# Patient Record
Sex: Female | Born: 1935
Health system: Southern US, Community
[De-identification: ages and names within clinical notes are randomized; demographics above are authoritative.]

## PROBLEM LIST (undated history)

## (undated) DIAGNOSIS — D4959 Neoplasm of unspecified behavior of other genitourinary organ: Secondary | ICD-10-CM

## (undated) DIAGNOSIS — E785 Hyperlipidemia, unspecified: Secondary | ICD-10-CM

## (undated) DIAGNOSIS — K219 Gastro-esophageal reflux disease without esophagitis: Secondary | ICD-10-CM

## (undated) DIAGNOSIS — D649 Anemia, unspecified: Secondary | ICD-10-CM

## (undated) DIAGNOSIS — I10 Essential (primary) hypertension: Secondary | ICD-10-CM

## (undated) DIAGNOSIS — G629 Polyneuropathy, unspecified: Secondary | ICD-10-CM

## (undated) DIAGNOSIS — I872 Venous insufficiency (chronic) (peripheral): Secondary | ICD-10-CM

## (undated) DIAGNOSIS — I519 Heart disease, unspecified: Secondary | ICD-10-CM

## (undated) DIAGNOSIS — J189 Pneumonia, unspecified organism: Secondary | ICD-10-CM

## (undated) DIAGNOSIS — F0151 Vascular dementia with behavioral disturbance: Secondary | ICD-10-CM

## (undated) DIAGNOSIS — I428 Other cardiomyopathies: Secondary | ICD-10-CM

## (undated) DIAGNOSIS — J449 Chronic obstructive pulmonary disease, unspecified: Secondary | ICD-10-CM

## (undated) DIAGNOSIS — I4891 Unspecified atrial fibrillation: Secondary | ICD-10-CM

## (undated) DIAGNOSIS — I447 Left bundle-branch block, unspecified: Secondary | ICD-10-CM

## (undated) DIAGNOSIS — C801 Malignant (primary) neoplasm, unspecified: Secondary | ICD-10-CM

## (undated) DIAGNOSIS — I509 Heart failure, unspecified: Secondary | ICD-10-CM

## (undated) DIAGNOSIS — R0602 Shortness of breath: Secondary | ICD-10-CM

## (undated) DIAGNOSIS — N189 Chronic kidney disease, unspecified: Secondary | ICD-10-CM

## (undated) DIAGNOSIS — Z8719 Personal history of other diseases of the digestive system: Secondary | ICD-10-CM

## (undated) DIAGNOSIS — M1990 Unspecified osteoarthritis, unspecified site: Secondary | ICD-10-CM

## (undated) HISTORY — DX: Vascular dementia with behavioral disturbance: F01.51

## (undated) HISTORY — DX: Unspecified osteoarthritis, unspecified site: M19.90

## (undated) HISTORY — PX: TONSILLECTOMY: SUR1361

## (undated) HISTORY — DX: Anemia, unspecified: D64.9

## (undated) HISTORY — DX: Venous insufficiency (chronic) (peripheral): I87.2

## (undated) HISTORY — PX: ABDOMINAL HYSTERECTOMY: SHX81

## (undated) HISTORY — DX: Neoplasm of unspecified behavior of other genitourinary organ: D49.59

## (undated) HISTORY — DX: Heart disease, unspecified: I51.9

## (undated) HISTORY — DX: Chronic kidney disease, unspecified: N18.9

## (undated) HISTORY — DX: Essential (primary) hypertension: I10

## (undated) HISTORY — PX: DILATION AND CURETTAGE OF UTERUS: SHX78

## (undated) HISTORY — DX: Polyneuropathy, unspecified: G62.9

## (undated) HISTORY — DX: Heart failure, unspecified: I50.9

## (undated) HISTORY — DX: Other cardiomyopathies: I42.8

## (undated) HISTORY — PX: CATARACT EXTRACTION W/ INTRAOCULAR LENS  IMPLANT, BILATERAL: SHX1307

## (undated) HISTORY — DX: Malignant (primary) neoplasm, unspecified: C80.1

## (undated) HISTORY — DX: Hyperlipidemia, unspecified: E78.5

---

## 1985-06-30 HISTORY — PX: CHOLECYSTECTOMY: SHX55

## 1997-10-09 ENCOUNTER — Other Ambulatory Visit: Admission: RE | Admit: 1997-10-09 | Discharge: 1997-10-09 | Payer: Self-pay | Admitting: Family Medicine

## 1997-12-27 ENCOUNTER — Other Ambulatory Visit: Admission: RE | Admit: 1997-12-27 | Discharge: 1997-12-27 | Payer: Self-pay | Admitting: Family Medicine

## 1998-11-05 ENCOUNTER — Other Ambulatory Visit: Admission: RE | Admit: 1998-11-05 | Discharge: 1998-11-05 | Payer: Self-pay | Admitting: Family Medicine

## 2001-07-19 ENCOUNTER — Other Ambulatory Visit: Admission: RE | Admit: 2001-07-19 | Discharge: 2001-07-19 | Payer: Self-pay | Admitting: Family Medicine

## 2003-08-01 ENCOUNTER — Encounter (INDEPENDENT_AMBULATORY_CARE_PROVIDER_SITE_OTHER): Payer: Self-pay | Admitting: *Deleted

## 2003-08-01 LAB — CONVERTED CEMR LAB

## 2003-08-12 ENCOUNTER — Other Ambulatory Visit: Admission: RE | Admit: 2003-08-12 | Discharge: 2003-08-12 | Payer: Self-pay | Admitting: Family Medicine

## 2003-12-19 ENCOUNTER — Ambulatory Visit (HOSPITAL_COMMUNITY): Admission: RE | Admit: 2003-12-19 | Discharge: 2003-12-19 | Payer: Self-pay | Admitting: Internal Medicine

## 2004-02-04 ENCOUNTER — Emergency Department (HOSPITAL_COMMUNITY): Admission: EM | Admit: 2004-02-04 | Discharge: 2004-02-04 | Payer: Self-pay | Admitting: Family Medicine

## 2004-03-20 ENCOUNTER — Ambulatory Visit: Payer: Self-pay | Admitting: Family Medicine

## 2004-04-11 ENCOUNTER — Ambulatory Visit: Payer: Self-pay | Admitting: Family Medicine

## 2004-05-12 ENCOUNTER — Emergency Department (HOSPITAL_COMMUNITY): Admission: EM | Admit: 2004-05-12 | Discharge: 2004-05-12 | Payer: Self-pay | Admitting: Family Medicine

## 2004-05-13 ENCOUNTER — Ambulatory Visit: Payer: Self-pay

## 2004-07-02 ENCOUNTER — Ambulatory Visit: Payer: Self-pay | Admitting: Family Medicine

## 2004-10-09 ENCOUNTER — Ambulatory Visit: Payer: Self-pay | Admitting: Family Medicine

## 2004-11-01 ENCOUNTER — Ambulatory Visit: Payer: Self-pay | Admitting: Family Medicine

## 2005-01-16 ENCOUNTER — Ambulatory Visit: Payer: Self-pay | Admitting: Family Medicine

## 2005-01-21 ENCOUNTER — Ambulatory Visit: Payer: Self-pay | Admitting: Sports Medicine

## 2005-03-13 ENCOUNTER — Ambulatory Visit: Payer: Self-pay | Admitting: Family Medicine

## 2005-05-20 ENCOUNTER — Ambulatory Visit: Payer: Self-pay | Admitting: Family Medicine

## 2005-07-16 ENCOUNTER — Ambulatory Visit: Payer: Self-pay | Admitting: Family Medicine

## 2005-08-05 ENCOUNTER — Ambulatory Visit: Payer: Self-pay | Admitting: Family Medicine

## 2005-11-12 ENCOUNTER — Ambulatory Visit: Payer: Self-pay | Admitting: Family Medicine

## 2005-12-05 ENCOUNTER — Ambulatory Visit: Payer: Self-pay | Admitting: Family Medicine

## 2006-04-01 ENCOUNTER — Ambulatory Visit: Payer: Self-pay | Admitting: Family Medicine

## 2006-05-05 ENCOUNTER — Ambulatory Visit: Payer: Self-pay | Admitting: Family Medicine

## 2006-07-09 ENCOUNTER — Ambulatory Visit (HOSPITAL_COMMUNITY): Admission: RE | Admit: 2006-07-09 | Discharge: 2006-07-09 | Payer: Self-pay | Admitting: Family Medicine

## 2006-07-09 ENCOUNTER — Ambulatory Visit: Payer: Self-pay | Admitting: Family Medicine

## 2006-07-21 ENCOUNTER — Encounter (INDEPENDENT_AMBULATORY_CARE_PROVIDER_SITE_OTHER): Payer: Self-pay | Admitting: Cardiology

## 2006-07-21 ENCOUNTER — Ambulatory Visit (HOSPITAL_COMMUNITY): Admission: RE | Admit: 2006-07-21 | Discharge: 2006-07-21 | Payer: Self-pay | Admitting: Family Medicine

## 2006-08-27 DIAGNOSIS — E1140 Type 2 diabetes mellitus with diabetic neuropathy, unspecified: Secondary | ICD-10-CM | POA: Insufficient documentation

## 2006-08-27 DIAGNOSIS — E785 Hyperlipidemia, unspecified: Secondary | ICD-10-CM

## 2006-08-27 DIAGNOSIS — I10 Essential (primary) hypertension: Secondary | ICD-10-CM

## 2006-08-27 DIAGNOSIS — M1990 Unspecified osteoarthritis, unspecified site: Secondary | ICD-10-CM | POA: Insufficient documentation

## 2006-08-27 DIAGNOSIS — E119 Type 2 diabetes mellitus without complications: Secondary | ICD-10-CM | POA: Insufficient documentation

## 2006-08-28 ENCOUNTER — Encounter (INDEPENDENT_AMBULATORY_CARE_PROVIDER_SITE_OTHER): Payer: Self-pay | Admitting: *Deleted

## 2006-09-21 ENCOUNTER — Ambulatory Visit: Payer: Self-pay | Admitting: Family Medicine

## 2006-09-21 ENCOUNTER — Encounter (INDEPENDENT_AMBULATORY_CARE_PROVIDER_SITE_OTHER): Payer: Self-pay | Admitting: Family Medicine

## 2006-09-21 LAB — CONVERTED CEMR LAB
ALT: 16 units/L (ref 0–35)
Alkaline Phosphatase: 48 units/L (ref 39–117)
CO2: 25 meq/L (ref 19–32)
Creatinine, Ser: 1.64 mg/dL — ABNORMAL HIGH (ref 0.40–1.20)
Direct LDL: 76 mg/dL
Sodium: 143 meq/L (ref 135–145)
Total Bilirubin: 0.5 mg/dL (ref 0.3–1.2)
Total Protein: 7 g/dL (ref 6.0–8.3)

## 2006-09-23 ENCOUNTER — Encounter (INDEPENDENT_AMBULATORY_CARE_PROVIDER_SITE_OTHER): Payer: Self-pay | Admitting: Family Medicine

## 2006-10-13 ENCOUNTER — Ambulatory Visit: Payer: Self-pay | Admitting: Family Medicine

## 2006-10-13 LAB — CONVERTED CEMR LAB
HDL goal, serum: 40 mg/dL
Hgb A1c MFr Bld: 6.1 %
LDL Goal: 100 mg/dL

## 2006-11-20 ENCOUNTER — Telehealth: Payer: Self-pay | Admitting: *Deleted

## 2006-12-16 ENCOUNTER — Ambulatory Visit: Payer: Self-pay | Admitting: Family Medicine

## 2006-12-21 ENCOUNTER — Telehealth (INDEPENDENT_AMBULATORY_CARE_PROVIDER_SITE_OTHER): Payer: Self-pay | Admitting: *Deleted

## 2007-01-11 ENCOUNTER — Ambulatory Visit: Payer: Self-pay | Admitting: Family Medicine

## 2007-01-11 LAB — CONVERTED CEMR LAB: Hgb A1c MFr Bld: 5.9 %

## 2007-01-12 ENCOUNTER — Encounter (INDEPENDENT_AMBULATORY_CARE_PROVIDER_SITE_OTHER): Payer: Self-pay | Admitting: Family Medicine

## 2007-01-15 ENCOUNTER — Ambulatory Visit: Payer: Self-pay | Admitting: Family Medicine

## 2007-01-20 ENCOUNTER — Encounter (INDEPENDENT_AMBULATORY_CARE_PROVIDER_SITE_OTHER): Payer: Self-pay | Admitting: Family Medicine

## 2007-01-20 ENCOUNTER — Ambulatory Visit: Payer: Self-pay | Admitting: Family Medicine

## 2007-01-20 LAB — CONVERTED CEMR LAB
AST: 18 units/L (ref 0–37)
Alkaline Phosphatase: 45 units/L (ref 39–117)
BUN: 46 mg/dL — ABNORMAL HIGH (ref 6–23)
Glucose, Bld: 61 mg/dL — ABNORMAL LOW (ref 70–99)
Potassium: 4.7 meq/L (ref 3.5–5.3)
Sodium: 143 meq/L (ref 135–145)
Total Bilirubin: 0.7 mg/dL (ref 0.3–1.2)

## 2007-01-26 ENCOUNTER — Ambulatory Visit: Payer: Self-pay | Admitting: Family Medicine

## 2007-01-26 DIAGNOSIS — N183 Chronic kidney disease, stage 3 (moderate): Secondary | ICD-10-CM

## 2007-01-26 DIAGNOSIS — N1832 Chronic kidney disease, stage 3b: Secondary | ICD-10-CM | POA: Insufficient documentation

## 2007-01-27 ENCOUNTER — Encounter: Admission: RE | Admit: 2007-01-27 | Discharge: 2007-01-27 | Payer: Self-pay | Admitting: Sports Medicine

## 2007-01-30 ENCOUNTER — Encounter (INDEPENDENT_AMBULATORY_CARE_PROVIDER_SITE_OTHER): Payer: Self-pay | Admitting: Family Medicine

## 2007-01-30 LAB — CONVERTED CEMR LAB
Collection Interval-CRCL: 24 hr
Creatinine Clearance: 53 mL/min — ABNORMAL LOW (ref 75–115)
Creatinine, Urine: 55.2 mg/dL
Protein, Ur: 119 mg/24hr — ABNORMAL HIGH (ref 50–100)

## 2007-02-01 ENCOUNTER — Telehealth (INDEPENDENT_AMBULATORY_CARE_PROVIDER_SITE_OTHER): Payer: Self-pay | Admitting: Family Medicine

## 2007-02-18 ENCOUNTER — Telehealth (INDEPENDENT_AMBULATORY_CARE_PROVIDER_SITE_OTHER): Payer: Self-pay | Admitting: Family Medicine

## 2007-02-22 ENCOUNTER — Ambulatory Visit: Payer: Self-pay | Admitting: Family Medicine

## 2007-02-22 ENCOUNTER — Encounter (INDEPENDENT_AMBULATORY_CARE_PROVIDER_SITE_OTHER): Payer: Self-pay | Admitting: Family Medicine

## 2007-02-22 LAB — CONVERTED CEMR LAB
Calcium: 9.7 mg/dL (ref 8.4–10.5)
Creatinine, Ser: 1.71 mg/dL — ABNORMAL HIGH (ref 0.40–1.20)
Sodium: 142 meq/L (ref 135–145)

## 2007-03-04 ENCOUNTER — Ambulatory Visit: Payer: Self-pay | Admitting: Sports Medicine

## 2007-03-04 DIAGNOSIS — D4959 Neoplasm of unspecified behavior of other genitourinary organ: Secondary | ICD-10-CM

## 2007-03-04 HISTORY — DX: Neoplasm of unspecified behavior of other genitourinary organ: D49.59

## 2007-03-05 ENCOUNTER — Telehealth (INDEPENDENT_AMBULATORY_CARE_PROVIDER_SITE_OTHER): Payer: Self-pay | Admitting: Family Medicine

## 2007-03-15 ENCOUNTER — Encounter (INDEPENDENT_AMBULATORY_CARE_PROVIDER_SITE_OTHER): Payer: Self-pay | Admitting: Family Medicine

## 2007-03-15 ENCOUNTER — Ambulatory Visit (HOSPITAL_COMMUNITY): Admission: RE | Admit: 2007-03-15 | Discharge: 2007-03-15 | Payer: Self-pay | Admitting: Urology

## 2007-03-16 ENCOUNTER — Encounter (INDEPENDENT_AMBULATORY_CARE_PROVIDER_SITE_OTHER): Payer: Self-pay | Admitting: Family Medicine

## 2007-03-24 ENCOUNTER — Telehealth: Payer: Self-pay | Admitting: *Deleted

## 2007-03-26 ENCOUNTER — Encounter (INDEPENDENT_AMBULATORY_CARE_PROVIDER_SITE_OTHER): Payer: Self-pay | Admitting: Family Medicine

## 2007-04-05 ENCOUNTER — Encounter (INDEPENDENT_AMBULATORY_CARE_PROVIDER_SITE_OTHER): Payer: Self-pay | Admitting: Family Medicine

## 2007-04-05 ENCOUNTER — Telehealth (INDEPENDENT_AMBULATORY_CARE_PROVIDER_SITE_OTHER): Payer: Self-pay | Admitting: Family Medicine

## 2007-04-05 ENCOUNTER — Ambulatory Visit: Payer: Self-pay | Admitting: Sports Medicine

## 2007-04-06 ENCOUNTER — Telehealth (INDEPENDENT_AMBULATORY_CARE_PROVIDER_SITE_OTHER): Payer: Self-pay | Admitting: *Deleted

## 2007-04-12 ENCOUNTER — Encounter: Payer: Self-pay | Admitting: Sports Medicine

## 2007-04-12 ENCOUNTER — Telehealth (INDEPENDENT_AMBULATORY_CARE_PROVIDER_SITE_OTHER): Payer: Self-pay | Admitting: *Deleted

## 2007-04-12 ENCOUNTER — Ambulatory Visit (HOSPITAL_COMMUNITY): Admission: RE | Admit: 2007-04-12 | Discharge: 2007-04-12 | Payer: Self-pay | Admitting: Sports Medicine

## 2007-04-12 ENCOUNTER — Ambulatory Visit: Payer: Self-pay | Admitting: Vascular Surgery

## 2007-04-12 ENCOUNTER — Telehealth (INDEPENDENT_AMBULATORY_CARE_PROVIDER_SITE_OTHER): Payer: Self-pay | Admitting: Family Medicine

## 2007-04-20 ENCOUNTER — Ambulatory Visit: Payer: Self-pay | Admitting: Family Medicine

## 2007-04-22 ENCOUNTER — Encounter: Payer: Self-pay | Admitting: *Deleted

## 2007-05-04 ENCOUNTER — Ambulatory Visit: Payer: Self-pay | Admitting: Family Medicine

## 2007-05-06 ENCOUNTER — Ambulatory Visit: Payer: Self-pay | Admitting: Family Medicine

## 2007-05-06 ENCOUNTER — Telehealth (INDEPENDENT_AMBULATORY_CARE_PROVIDER_SITE_OTHER): Payer: Self-pay | Admitting: *Deleted

## 2007-05-06 ENCOUNTER — Encounter (INDEPENDENT_AMBULATORY_CARE_PROVIDER_SITE_OTHER): Payer: Self-pay | Admitting: Family Medicine

## 2007-05-08 LAB — CONVERTED CEMR LAB
ALT: 11 units/L (ref 0–35)
AST: 15 units/L (ref 0–37)
Albumin: 4.2 g/dL (ref 3.5–5.2)
CO2: 27 meq/L (ref 19–32)
Calcium: 10.1 mg/dL (ref 8.4–10.5)
Chloride: 104 meq/L (ref 96–112)
Potassium: 4.3 meq/L (ref 3.5–5.3)
Sodium: 142 meq/L (ref 135–145)
Total Protein: 7.3 g/dL (ref 6.0–8.3)

## 2007-05-13 ENCOUNTER — Telehealth (INDEPENDENT_AMBULATORY_CARE_PROVIDER_SITE_OTHER): Payer: Self-pay | Admitting: Family Medicine

## 2007-05-17 ENCOUNTER — Ambulatory Visit: Payer: Self-pay | Admitting: Family Medicine

## 2007-05-19 ENCOUNTER — Encounter (INDEPENDENT_AMBULATORY_CARE_PROVIDER_SITE_OTHER): Payer: Self-pay | Admitting: Family Medicine

## 2007-05-31 ENCOUNTER — Telehealth (INDEPENDENT_AMBULATORY_CARE_PROVIDER_SITE_OTHER): Payer: Self-pay | Admitting: Family Medicine

## 2007-06-04 ENCOUNTER — Ambulatory Visit: Payer: Self-pay | Admitting: Family Medicine

## 2007-06-08 ENCOUNTER — Telehealth (INDEPENDENT_AMBULATORY_CARE_PROVIDER_SITE_OTHER): Payer: Self-pay | Admitting: *Deleted

## 2007-06-09 ENCOUNTER — Ambulatory Visit: Payer: Self-pay | Admitting: Family Medicine

## 2007-07-09 ENCOUNTER — Ambulatory Visit: Payer: Self-pay | Admitting: Family Medicine

## 2007-07-09 LAB — CONVERTED CEMR LAB: Hgb A1c MFr Bld: 6.5 %

## 2007-07-13 ENCOUNTER — Telehealth: Payer: Self-pay | Admitting: *Deleted

## 2007-08-04 ENCOUNTER — Telehealth (INDEPENDENT_AMBULATORY_CARE_PROVIDER_SITE_OTHER): Payer: Self-pay | Admitting: Family Medicine

## 2007-08-17 ENCOUNTER — Telehealth (INDEPENDENT_AMBULATORY_CARE_PROVIDER_SITE_OTHER): Payer: Self-pay | Admitting: Family Medicine

## 2007-08-25 ENCOUNTER — Encounter (INDEPENDENT_AMBULATORY_CARE_PROVIDER_SITE_OTHER): Payer: Self-pay | Admitting: Family Medicine

## 2007-09-10 ENCOUNTER — Ambulatory Visit: Payer: Self-pay | Admitting: Family Medicine

## 2007-09-13 ENCOUNTER — Ambulatory Visit: Payer: Self-pay | Admitting: Family Medicine

## 2007-09-13 ENCOUNTER — Encounter (INDEPENDENT_AMBULATORY_CARE_PROVIDER_SITE_OTHER): Payer: Self-pay | Admitting: Family Medicine

## 2007-09-13 LAB — CONVERTED CEMR LAB
Albumin: 4.5 g/dL (ref 3.5–5.2)
Alkaline Phosphatase: 39 units/L (ref 39–117)
BUN: 46 mg/dL — ABNORMAL HIGH (ref 6–23)
Creatinine, Ser: 1.73 mg/dL — ABNORMAL HIGH (ref 0.40–1.20)
Glucose, Bld: 120 mg/dL — ABNORMAL HIGH (ref 70–99)
HDL: 54 mg/dL (ref >39)
LDL Cholesterol: 76 mg/dL (ref 0–99)
Potassium: 4.6 meq/L (ref 3.5–5.3)
Total Bilirubin: 0.7 mg/dL (ref 0.3–1.2)
Total CHOL/HDL Ratio: 3
Triglycerides: 164 mg/dL — ABNORMAL HIGH (ref <150)

## 2007-09-14 ENCOUNTER — Encounter (INDEPENDENT_AMBULATORY_CARE_PROVIDER_SITE_OTHER): Payer: Self-pay | Admitting: *Deleted

## 2007-09-15 ENCOUNTER — Telehealth (INDEPENDENT_AMBULATORY_CARE_PROVIDER_SITE_OTHER): Payer: Self-pay | Admitting: Family Medicine

## 2007-09-20 ENCOUNTER — Ambulatory Visit (HOSPITAL_COMMUNITY): Admission: RE | Admit: 2007-09-20 | Discharge: 2007-09-20 | Payer: Self-pay | Admitting: Urology

## 2007-09-22 ENCOUNTER — Ambulatory Visit: Payer: Self-pay | Admitting: Family Medicine

## 2007-09-23 ENCOUNTER — Encounter (INDEPENDENT_AMBULATORY_CARE_PROVIDER_SITE_OTHER): Payer: Self-pay | Admitting: Family Medicine

## 2007-10-22 ENCOUNTER — Telehealth: Payer: Self-pay | Admitting: *Deleted

## 2007-11-03 ENCOUNTER — Ambulatory Visit: Payer: Self-pay | Admitting: Family Medicine

## 2007-11-18 ENCOUNTER — Telehealth (INDEPENDENT_AMBULATORY_CARE_PROVIDER_SITE_OTHER): Payer: Self-pay | Admitting: *Deleted

## 2007-11-18 ENCOUNTER — Encounter (INDEPENDENT_AMBULATORY_CARE_PROVIDER_SITE_OTHER): Payer: Self-pay | Admitting: Family Medicine

## 2007-11-30 ENCOUNTER — Ambulatory Visit: Payer: Self-pay | Admitting: Family Medicine

## 2007-11-30 ENCOUNTER — Encounter: Admission: RE | Admit: 2007-11-30 | Discharge: 2007-11-30 | Payer: Self-pay | Admitting: Family Medicine

## 2008-01-06 ENCOUNTER — Telehealth: Payer: Self-pay | Admitting: *Deleted

## 2008-01-13 ENCOUNTER — Telehealth (INDEPENDENT_AMBULATORY_CARE_PROVIDER_SITE_OTHER): Payer: Self-pay | Admitting: Family Medicine

## 2008-01-21 ENCOUNTER — Telehealth: Payer: Self-pay | Admitting: *Deleted

## 2008-03-02 ENCOUNTER — Ambulatory Visit: Payer: Self-pay | Admitting: Family Medicine

## 2008-03-02 ENCOUNTER — Encounter (INDEPENDENT_AMBULATORY_CARE_PROVIDER_SITE_OTHER): Payer: Self-pay | Admitting: Family Medicine

## 2008-03-08 LAB — CONVERTED CEMR LAB
Albumin: 4.4 g/dL (ref 3.5–5.2)
Alkaline Phosphatase: 40 units/L (ref 39–117)
BUN: 50 mg/dL — ABNORMAL HIGH (ref 6–23)
Calcium: 10.1 mg/dL (ref 8.4–10.5)
Creatinine, Ser: 2.08 mg/dL — ABNORMAL HIGH (ref 0.40–1.20)
Glucose, Bld: 87 mg/dL (ref 70–99)
Potassium: 4.5 meq/L (ref 3.5–5.3)

## 2008-03-11 ENCOUNTER — Emergency Department (HOSPITAL_COMMUNITY): Admission: EM | Admit: 2008-03-11 | Discharge: 2008-03-11 | Payer: Self-pay | Admitting: Emergency Medicine

## 2008-03-13 ENCOUNTER — Telehealth: Payer: Self-pay | Admitting: *Deleted

## 2008-03-15 ENCOUNTER — Ambulatory Visit (HOSPITAL_COMMUNITY): Admission: RE | Admit: 2008-03-15 | Discharge: 2008-03-15 | Payer: Self-pay | Admitting: Urology

## 2008-03-16 ENCOUNTER — Ambulatory Visit: Payer: Self-pay | Admitting: Family Medicine

## 2008-03-23 ENCOUNTER — Telehealth: Payer: Self-pay | Admitting: *Deleted

## 2008-03-27 ENCOUNTER — Telehealth: Payer: Self-pay | Admitting: *Deleted

## 2008-03-30 ENCOUNTER — Encounter (INDEPENDENT_AMBULATORY_CARE_PROVIDER_SITE_OTHER): Payer: Self-pay | Admitting: Family Medicine

## 2008-04-04 ENCOUNTER — Ambulatory Visit: Payer: Self-pay | Admitting: Family Medicine

## 2008-04-04 ENCOUNTER — Encounter (INDEPENDENT_AMBULATORY_CARE_PROVIDER_SITE_OTHER): Payer: Self-pay | Admitting: Family Medicine

## 2008-04-05 ENCOUNTER — Encounter (INDEPENDENT_AMBULATORY_CARE_PROVIDER_SITE_OTHER): Payer: Self-pay | Admitting: Family Medicine

## 2008-05-01 ENCOUNTER — Telehealth (INDEPENDENT_AMBULATORY_CARE_PROVIDER_SITE_OTHER): Payer: Self-pay | Admitting: *Deleted

## 2008-05-04 ENCOUNTER — Ambulatory Visit: Payer: Self-pay | Admitting: Family Medicine

## 2008-05-04 ENCOUNTER — Encounter (INDEPENDENT_AMBULATORY_CARE_PROVIDER_SITE_OTHER): Payer: Self-pay | Admitting: Family Medicine

## 2008-05-04 ENCOUNTER — Telehealth (INDEPENDENT_AMBULATORY_CARE_PROVIDER_SITE_OTHER): Payer: Self-pay | Admitting: Family Medicine

## 2008-05-04 LAB — CONVERTED CEMR LAB

## 2008-05-06 LAB — CONVERTED CEMR LAB
AST: 17 units/L (ref 0–37)
Alkaline Phosphatase: 31 units/L — ABNORMAL LOW (ref 39–117)
BUN: 56 mg/dL — ABNORMAL HIGH (ref 6–23)
Calcium: 9.7 mg/dL (ref 8.4–10.5)
Chloride: 112 meq/L (ref 96–112)
Creatinine, Ser: 2.76 mg/dL — ABNORMAL HIGH (ref 0.40–1.20)
Glucose, Bld: 67 mg/dL — ABNORMAL LOW (ref 70–99)
HCT: 32.2 % — ABNORMAL LOW (ref 36.0–46.0)
Hemoglobin: 10.2 g/dL — ABNORMAL LOW (ref 12.0–15.0)
MCHC: 31.7 g/dL (ref 30.0–36.0)
RBC: 3.5 M/uL — ABNORMAL LOW (ref 3.87–5.11)
RDW: 13.3 % (ref 11.5–15.5)

## 2008-05-08 ENCOUNTER — Ambulatory Visit: Payer: Self-pay | Admitting: Family Medicine

## 2008-05-08 ENCOUNTER — Telehealth (INDEPENDENT_AMBULATORY_CARE_PROVIDER_SITE_OTHER): Payer: Self-pay | Admitting: *Deleted

## 2008-05-15 ENCOUNTER — Encounter (INDEPENDENT_AMBULATORY_CARE_PROVIDER_SITE_OTHER): Payer: Self-pay | Admitting: Family Medicine

## 2008-05-22 ENCOUNTER — Ambulatory Visit: Payer: Self-pay | Admitting: Vascular Surgery

## 2008-05-22 ENCOUNTER — Encounter (INDEPENDENT_AMBULATORY_CARE_PROVIDER_SITE_OTHER): Payer: Self-pay | Admitting: Family Medicine

## 2008-05-23 ENCOUNTER — Encounter: Admission: RE | Admit: 2008-05-23 | Discharge: 2008-05-23 | Payer: Self-pay | Admitting: Nephrology

## 2008-05-30 ENCOUNTER — Telehealth (INDEPENDENT_AMBULATORY_CARE_PROVIDER_SITE_OTHER): Payer: Self-pay | Admitting: Family Medicine

## 2008-06-07 ENCOUNTER — Telehealth (INDEPENDENT_AMBULATORY_CARE_PROVIDER_SITE_OTHER): Payer: Self-pay | Admitting: Family Medicine

## 2008-06-08 ENCOUNTER — Ambulatory Visit: Payer: Self-pay | Admitting: Family Medicine

## 2008-06-27 ENCOUNTER — Encounter (INDEPENDENT_AMBULATORY_CARE_PROVIDER_SITE_OTHER): Payer: Self-pay | Admitting: Family Medicine

## 2008-06-29 ENCOUNTER — Encounter: Admission: RE | Admit: 2008-06-29 | Discharge: 2008-06-29 | Payer: Self-pay | Admitting: Orthopedic Surgery

## 2008-07-05 ENCOUNTER — Telehealth: Payer: Self-pay | Admitting: *Deleted

## 2008-07-10 ENCOUNTER — Ambulatory Visit: Payer: Self-pay | Admitting: Family Medicine

## 2008-07-10 LAB — CONVERTED CEMR LAB: Hgb A1c MFr Bld: 6.4 %

## 2008-07-11 ENCOUNTER — Telehealth: Payer: Self-pay | Admitting: Family Medicine

## 2008-07-14 ENCOUNTER — Encounter: Admission: RE | Admit: 2008-07-14 | Discharge: 2008-07-14 | Payer: Self-pay | Admitting: Orthopedic Surgery

## 2008-07-17 ENCOUNTER — Ambulatory Visit (HOSPITAL_COMMUNITY): Admission: RE | Admit: 2008-07-17 | Discharge: 2008-07-17 | Payer: Self-pay | Admitting: Urology

## 2008-07-28 ENCOUNTER — Encounter: Admission: RE | Admit: 2008-07-28 | Discharge: 2008-07-28 | Payer: Self-pay | Admitting: Orthopedic Surgery

## 2008-08-03 ENCOUNTER — Encounter (INDEPENDENT_AMBULATORY_CARE_PROVIDER_SITE_OTHER): Payer: Self-pay | Admitting: Family Medicine

## 2008-08-08 ENCOUNTER — Telehealth (INDEPENDENT_AMBULATORY_CARE_PROVIDER_SITE_OTHER): Payer: Self-pay | Admitting: Family Medicine

## 2008-08-28 ENCOUNTER — Encounter (INDEPENDENT_AMBULATORY_CARE_PROVIDER_SITE_OTHER): Payer: Self-pay | Admitting: Family Medicine

## 2008-09-05 ENCOUNTER — Ambulatory Visit: Payer: Self-pay | Admitting: Family Medicine

## 2008-09-14 ENCOUNTER — Ambulatory Visit: Payer: Self-pay | Admitting: Family Medicine

## 2008-09-14 ENCOUNTER — Encounter (INDEPENDENT_AMBULATORY_CARE_PROVIDER_SITE_OTHER): Payer: Self-pay | Admitting: Family Medicine

## 2008-09-14 LAB — CONVERTED CEMR LAB
ALT: 10 units/L (ref 0–35)
AST: 19 units/L (ref 0–37)
CO2: 23 meq/L (ref 19–32)
Cholesterol: 186 mg/dL (ref 0–200)
Creatinine, Ser: 1.8 mg/dL — ABNORMAL HIGH (ref 0.40–1.20)
LDL Cholesterol: 94 mg/dL (ref 0–99)
Sodium: 141 meq/L (ref 135–145)
Total Bilirubin: 0.4 mg/dL (ref 0.3–1.2)
Total CHOL/HDL Ratio: 3.5
Total Protein: 6.4 g/dL (ref 6.0–8.3)
VLDL: 39 mg/dL (ref 0–40)

## 2008-09-18 ENCOUNTER — Telehealth (INDEPENDENT_AMBULATORY_CARE_PROVIDER_SITE_OTHER): Payer: Self-pay | Admitting: Family Medicine

## 2008-09-22 ENCOUNTER — Ambulatory Visit: Payer: Self-pay | Admitting: Family Medicine

## 2008-09-25 ENCOUNTER — Encounter (INDEPENDENT_AMBULATORY_CARE_PROVIDER_SITE_OTHER): Payer: Self-pay | Admitting: Family Medicine

## 2008-09-28 ENCOUNTER — Encounter (HOSPITAL_COMMUNITY): Admission: RE | Admit: 2008-09-28 | Discharge: 2008-12-27 | Payer: Self-pay | Admitting: Nephrology

## 2008-10-11 ENCOUNTER — Ambulatory Visit: Payer: Self-pay | Admitting: Family Medicine

## 2008-10-11 LAB — CONVERTED CEMR LAB: Hgb A1c MFr Bld: 7.4 %

## 2008-10-24 ENCOUNTER — Telehealth: Payer: Self-pay | Admitting: *Deleted

## 2008-10-30 ENCOUNTER — Telehealth (INDEPENDENT_AMBULATORY_CARE_PROVIDER_SITE_OTHER): Payer: Self-pay | Admitting: Family Medicine

## 2008-10-31 ENCOUNTER — Telehealth (INDEPENDENT_AMBULATORY_CARE_PROVIDER_SITE_OTHER): Payer: Self-pay | Admitting: *Deleted

## 2008-11-22 ENCOUNTER — Ambulatory Visit: Payer: Self-pay | Admitting: Family Medicine

## 2008-12-11 ENCOUNTER — Encounter (INDEPENDENT_AMBULATORY_CARE_PROVIDER_SITE_OTHER): Payer: Self-pay | Admitting: Family Medicine

## 2008-12-14 ENCOUNTER — Telehealth (INDEPENDENT_AMBULATORY_CARE_PROVIDER_SITE_OTHER): Payer: Self-pay | Admitting: Family Medicine

## 2008-12-21 ENCOUNTER — Telehealth: Payer: Self-pay | Admitting: *Deleted

## 2008-12-21 ENCOUNTER — Telehealth (INDEPENDENT_AMBULATORY_CARE_PROVIDER_SITE_OTHER): Payer: Self-pay | Admitting: Family Medicine

## 2008-12-22 ENCOUNTER — Telehealth: Payer: Self-pay | Admitting: *Deleted

## 2009-01-02 ENCOUNTER — Telehealth: Payer: Self-pay | Admitting: Family Medicine

## 2009-01-08 ENCOUNTER — Encounter: Payer: Self-pay | Admitting: Family Medicine

## 2009-01-16 ENCOUNTER — Encounter: Payer: Self-pay | Admitting: Family Medicine

## 2009-01-23 ENCOUNTER — Telehealth: Payer: Self-pay | Admitting: Family Medicine

## 2009-01-24 ENCOUNTER — Ambulatory Visit: Payer: Self-pay | Admitting: Family Medicine

## 2009-03-12 ENCOUNTER — Telehealth: Payer: Self-pay | Admitting: Family Medicine

## 2009-03-30 ENCOUNTER — Ambulatory Visit: Payer: Self-pay | Admitting: Family Medicine

## 2009-04-04 ENCOUNTER — Encounter: Payer: Self-pay | Admitting: Family Medicine

## 2009-04-04 ENCOUNTER — Ambulatory Visit: Payer: Self-pay | Admitting: Family Medicine

## 2009-04-04 LAB — CONVERTED CEMR LAB
ALT: 10 units/L (ref 0–35)
AST: 14 units/L (ref 0–37)
Alkaline Phosphatase: 40 units/L (ref 39–117)
CO2: 22 meq/L (ref 19–32)
Creatinine, Ser: 1.92 mg/dL — ABNORMAL HIGH (ref 0.40–1.20)
Direct LDL: 90 mg/dL
Hgb A1c MFr Bld: 6.5 %
Sodium: 140 meq/L (ref 135–145)
Total Bilirubin: 0.7 mg/dL (ref 0.3–1.2)
Total Protein: 6.7 g/dL (ref 6.0–8.3)

## 2009-04-06 ENCOUNTER — Telehealth: Payer: Self-pay | Admitting: *Deleted

## 2009-04-06 ENCOUNTER — Telehealth: Payer: Self-pay | Admitting: Family Medicine

## 2009-04-09 ENCOUNTER — Telehealth: Payer: Self-pay | Admitting: *Deleted

## 2009-04-09 ENCOUNTER — Ambulatory Visit: Payer: Self-pay | Admitting: Family Medicine

## 2009-04-10 ENCOUNTER — Telehealth: Payer: Self-pay | Admitting: *Deleted

## 2009-04-16 ENCOUNTER — Telehealth: Payer: Self-pay | Admitting: Family Medicine

## 2009-05-05 ENCOUNTER — Encounter: Payer: Self-pay | Admitting: Family Medicine

## 2009-06-01 ENCOUNTER — Telehealth: Payer: Self-pay | Admitting: Family Medicine

## 2009-06-25 ENCOUNTER — Ambulatory Visit: Payer: Self-pay | Admitting: Family Medicine

## 2009-06-28 ENCOUNTER — Encounter: Admission: RE | Admit: 2009-06-28 | Discharge: 2009-06-28 | Payer: Self-pay | Admitting: Family Medicine

## 2009-06-28 ENCOUNTER — Telehealth: Payer: Self-pay | Admitting: Family Medicine

## 2009-06-28 ENCOUNTER — Ambulatory Visit: Payer: Self-pay | Admitting: Family Medicine

## 2009-07-02 ENCOUNTER — Telehealth: Payer: Self-pay | Admitting: Family Medicine

## 2009-07-09 ENCOUNTER — Ambulatory Visit: Payer: Self-pay | Admitting: Family Medicine

## 2009-07-13 ENCOUNTER — Encounter (HOSPITAL_COMMUNITY): Admission: RE | Admit: 2009-07-13 | Discharge: 2009-10-08 | Payer: Self-pay | Admitting: Nephrology

## 2009-07-24 ENCOUNTER — Encounter: Payer: Self-pay | Admitting: Family Medicine

## 2009-08-14 ENCOUNTER — Telehealth: Payer: Self-pay | Admitting: Family Medicine

## 2009-08-17 ENCOUNTER — Ambulatory Visit: Payer: Self-pay | Admitting: Family Medicine

## 2009-08-17 ENCOUNTER — Encounter: Payer: Self-pay | Admitting: Family Medicine

## 2009-08-17 DIAGNOSIS — R634 Abnormal weight loss: Secondary | ICD-10-CM | POA: Insufficient documentation

## 2009-08-17 DIAGNOSIS — L659 Nonscarring hair loss, unspecified: Secondary | ICD-10-CM | POA: Insufficient documentation

## 2009-08-17 LAB — CONVERTED CEMR LAB: OCCULT 3: NEGATIVE

## 2009-08-20 ENCOUNTER — Telehealth: Payer: Self-pay | Admitting: Family Medicine

## 2009-09-05 ENCOUNTER — Telehealth: Payer: Self-pay | Admitting: Family Medicine

## 2009-09-17 ENCOUNTER — Telehealth: Payer: Self-pay | Admitting: Family Medicine

## 2009-10-08 ENCOUNTER — Encounter: Payer: Self-pay | Admitting: Family Medicine

## 2009-10-11 ENCOUNTER — Telehealth: Payer: Self-pay | Admitting: Family Medicine

## 2009-10-17 ENCOUNTER — Encounter: Payer: Self-pay | Admitting: Family Medicine

## 2009-10-17 ENCOUNTER — Ambulatory Visit: Payer: Self-pay | Admitting: Family Medicine

## 2009-10-17 LAB — CONVERTED CEMR LAB: Hgb A1c MFr Bld: 6.1 %

## 2009-10-18 LAB — CONVERTED CEMR LAB
BUN: 34 mg/dL — ABNORMAL HIGH (ref 6–23)
CO2: 22 meq/L (ref 19–32)
Calcium: 9.7 mg/dL (ref 8.4–10.5)
Glucose, Bld: 92 mg/dL (ref 70–99)
HCT: 41.1 % (ref 36.0–46.0)
HDL: 65 mg/dL (ref >39)
Hemoglobin: 12.8 g/dL (ref 12.0–15.0)
LDL Cholesterol: 63 mg/dL (ref 0–99)
MCHC: 31.1 g/dL (ref 30.0–36.0)
RBC: 4.27 M/uL (ref 3.87–5.11)
RDW: 14.4 % (ref 11.5–15.5)
TSH: 3.712 microintl units/mL (ref 0.350–4.500)
Triglycerides: 109 mg/dL (ref <150)
VLDL: 22 mg/dL (ref 0–40)

## 2009-11-21 ENCOUNTER — Telehealth: Payer: Self-pay | Admitting: Family Medicine

## 2010-01-04 ENCOUNTER — Ambulatory Visit: Payer: Self-pay | Admitting: Family Medicine

## 2010-01-04 DIAGNOSIS — J449 Chronic obstructive pulmonary disease, unspecified: Secondary | ICD-10-CM | POA: Insufficient documentation

## 2010-01-10 ENCOUNTER — Telehealth: Payer: Self-pay | Admitting: Family Medicine

## 2010-01-22 ENCOUNTER — Encounter: Payer: Self-pay | Admitting: Family Medicine

## 2010-01-22 ENCOUNTER — Ambulatory Visit (HOSPITAL_COMMUNITY): Admission: RE | Admit: 2010-01-22 | Discharge: 2010-01-22 | Payer: Self-pay | Admitting: Family Medicine

## 2010-01-22 DIAGNOSIS — I5032 Chronic diastolic (congestive) heart failure: Secondary | ICD-10-CM

## 2010-01-22 HISTORY — DX: Chronic diastolic (congestive) heart failure: I50.32

## 2010-01-23 ENCOUNTER — Encounter: Payer: Self-pay | Admitting: Family Medicine

## 2010-01-23 ENCOUNTER — Telehealth: Payer: Self-pay | Admitting: Family Medicine

## 2010-01-25 ENCOUNTER — Encounter (HOSPITAL_COMMUNITY): Admission: RE | Admit: 2010-01-25 | Discharge: 2010-01-30 | Payer: Self-pay | Admitting: Nephrology

## 2010-01-27 ENCOUNTER — Telehealth: Payer: Self-pay | Admitting: Family Medicine

## 2010-03-07 ENCOUNTER — Telehealth: Payer: Self-pay | Admitting: Family Medicine

## 2010-03-08 ENCOUNTER — Encounter
Admission: RE | Admit: 2010-03-08 | Discharge: 2010-03-08 | Payer: Self-pay | Source: Home / Self Care | Admitting: Family Medicine

## 2010-03-08 ENCOUNTER — Ambulatory Visit: Payer: Self-pay | Admitting: Family Medicine

## 2010-03-08 DIAGNOSIS — R05 Cough: Secondary | ICD-10-CM

## 2010-03-25 ENCOUNTER — Ambulatory Visit: Payer: Self-pay | Admitting: Family Medicine

## 2010-03-25 LAB — CONVERTED CEMR LAB: Hgb A1c MFr Bld: 6.2 %

## 2010-04-01 ENCOUNTER — Ambulatory Visit: Payer: Self-pay | Admitting: Family Medicine

## 2010-04-10 ENCOUNTER — Encounter: Payer: Self-pay | Admitting: Family Medicine

## 2010-05-01 ENCOUNTER — Telehealth: Payer: Self-pay | Admitting: *Deleted

## 2010-05-02 ENCOUNTER — Ambulatory Visit: Payer: Self-pay | Admitting: Family Medicine

## 2010-05-02 ENCOUNTER — Encounter: Payer: Self-pay | Admitting: Family Medicine

## 2010-05-02 DIAGNOSIS — M545 Low back pain: Secondary | ICD-10-CM

## 2010-05-02 DIAGNOSIS — R269 Unspecified abnormalities of gait and mobility: Secondary | ICD-10-CM

## 2010-05-02 DIAGNOSIS — R609 Edema, unspecified: Secondary | ICD-10-CM

## 2010-05-02 LAB — CONVERTED CEMR LAB
BUN: 36 mg/dL — ABNORMAL HIGH (ref 6–23)
Chloride: 107 meq/L (ref 96–112)
Creatinine, Ser: 1.8 mg/dL — ABNORMAL HIGH (ref 0.40–1.20)

## 2010-06-05 ENCOUNTER — Ambulatory Visit: Payer: Self-pay

## 2010-06-05 ENCOUNTER — Telehealth: Payer: Self-pay | Admitting: Family Medicine

## 2010-07-09 ENCOUNTER — Ambulatory Visit: Admission: RE | Admit: 2010-07-09 | Discharge: 2010-07-09 | Payer: Self-pay | Source: Home / Self Care

## 2010-07-09 ENCOUNTER — Encounter: Payer: Self-pay | Admitting: Family Medicine

## 2010-07-16 ENCOUNTER — Ambulatory Visit
Admission: RE | Admit: 2010-07-16 | Discharge: 2010-07-16 | Payer: Self-pay | Source: Home / Self Care | Attending: Family Medicine | Admitting: Family Medicine

## 2010-07-16 ENCOUNTER — Telehealth: Payer: Self-pay | Admitting: *Deleted

## 2010-07-16 ENCOUNTER — Ambulatory Visit (HOSPITAL_COMMUNITY)
Admission: RE | Admit: 2010-07-16 | Discharge: 2010-07-16 | Payer: Self-pay | Source: Home / Self Care | Attending: Family Medicine | Admitting: Family Medicine

## 2010-07-16 ENCOUNTER — Telehealth: Payer: Self-pay | Admitting: Family Medicine

## 2010-07-17 ENCOUNTER — Ambulatory Visit: Admission: RE | Admit: 2010-07-17 | Discharge: 2010-07-17 | Payer: Self-pay | Source: Home / Self Care

## 2010-07-17 DIAGNOSIS — J209 Acute bronchitis, unspecified: Secondary | ICD-10-CM | POA: Insufficient documentation

## 2010-07-18 ENCOUNTER — Ambulatory Visit: Admit: 2010-07-18 | Payer: Self-pay

## 2010-07-18 ENCOUNTER — Inpatient Hospital Stay (HOSPITAL_COMMUNITY)
Admission: AD | Admit: 2010-07-18 | Discharge: 2010-07-19 | Payer: Self-pay | Source: Home / Self Care | Attending: Family Medicine | Admitting: Family Medicine

## 2010-07-18 ENCOUNTER — Ambulatory Visit: Admission: RE | Admit: 2010-07-18 | Discharge: 2010-07-18 | Payer: Self-pay | Source: Home / Self Care

## 2010-07-18 DIAGNOSIS — I10 Essential (primary) hypertension: Secondary | ICD-10-CM

## 2010-07-18 DIAGNOSIS — J209 Acute bronchitis, unspecified: Secondary | ICD-10-CM

## 2010-07-18 DIAGNOSIS — J708 Respiratory conditions due to other specified external agents: Secondary | ICD-10-CM

## 2010-07-18 DIAGNOSIS — E119 Type 2 diabetes mellitus without complications: Secondary | ICD-10-CM

## 2010-07-22 LAB — CBC
HCT: 35 % — ABNORMAL LOW (ref 36.0–46.0)
Hemoglobin: 11.2 g/dL — ABNORMAL LOW (ref 12.0–15.0)
MCH: 30.7 pg (ref 26.0–34.0)
MCHC: 32 g/dL (ref 30.0–36.0)
MCV: 94.6 fL (ref 78.0–100.0)
MCV: 95.9 fL (ref 78.0–100.0)
Platelets: 188 10*3/uL (ref 150–400)
RBC: 3.39 MIL/uL — ABNORMAL LOW (ref 3.87–5.11)
RDW: 12.3 % (ref 11.5–15.5)
RDW: 12.6 % (ref 11.5–15.5)
WBC: 5.7 10*3/uL (ref 4.0–10.5)

## 2010-07-22 LAB — BASIC METABOLIC PANEL
BUN: 20 mg/dL (ref 6–23)
BUN: 25 mg/dL — ABNORMAL HIGH (ref 6–23)
CO2: 29 mEq/L (ref 19–32)
Calcium: 9.1 mg/dL (ref 8.4–10.5)
Creatinine, Ser: 1.62 mg/dL — ABNORMAL HIGH (ref 0.4–1.2)
GFR calc Af Amer: 37 mL/min — ABNORMAL LOW (ref >60)
GFR calc non Af Amer: 29 mL/min — ABNORMAL LOW (ref >60)
GFR calc non Af Amer: 31 mL/min — ABNORMAL LOW (ref >60)
Glucose, Bld: 107 mg/dL — ABNORMAL HIGH (ref 70–99)
Potassium: 3.9 mEq/L (ref 3.5–5.1)

## 2010-07-22 LAB — CARDIAC PANEL(CRET KIN+CKTOT+MB+TROPI)
CK, MB: 2.4 ng/mL (ref 0.3–4.0)
Relative Index: INVALID (ref 0.0–2.5)
Total CK: 76 U/L (ref 7–177)
Total CK: 67 U/L (ref 7–177)
Troponin I: 0.02 ng/mL (ref 0.00–0.06)

## 2010-07-22 LAB — BRAIN NATRIURETIC PEPTIDE: Pro B Natriuretic peptide (BNP): 173 pg/mL — ABNORMAL HIGH (ref 0.0–100.0)

## 2010-07-25 ENCOUNTER — Encounter: Payer: Self-pay | Admitting: Family Medicine

## 2010-07-26 NOTE — Discharge Summary (Signed)
NAME:  [name], [name]                ACCOUNT NO.:  [id number]  MEDICAL RECORD NO.:  [id number]          PATIENT TYPE:  INP  LOCATION:  5531                         FACILITY:  [facility]  PHYSICIAN:  Wayne A. Sheffield Slider, M.D.    DATE OF BIRTH:  [birth year]  DATE OF ADMISSION:  07/18/2010 DATE OF DISCHARGE:  07/19/2010                              DISCHARGE SUMMARY   PRIMARY CARE PROVIDER:  [name], MD, at [facility].  DISCHARGE DIAGNOSES: 1. Bronchitis. 2. Reactive airway disease. 3. Diabetes mellitus type 2. 4. Hypertension. 5. Hyperlipidemia.  DISCHARGE MEDICATIONS: 1. Prednisone 40 mg p.o. daily for 3 days, 20 mg p.o. daily for 3     days, then 10 mg p.o. daily for 4 days, and stop. 2. Albuterol inhaler 2 puffs q.4 p.r.n. shortness of breath. 3. Amlodipine 10 mg p.o. daily. 4. Aspirin 81 mg chewable p.o. daily. 5. Calcium carbonate/vitamin D over-the-counter 1 tab p.o. b.i.d. 6. Cinnamon over-the-counter 1 capsule p.o. daily. 7. Coreg 6.25 mg p.o. b.i.d. 8. Vitamin B12 500 mcg 2 tablets p.o. daily. 9. Ferrous sulfate 325 mg p.o. daily. 10.Fish oil 2000 mg p.o. daily. 11.Gabapentin 300 mg p.o. b.i.d. 12.Glipizide XL 10 mg p.o. daily. 13.Lipitor 80 mg p.o. bedtime. 14.Procrit 1000 units IM once monthly. 15.Tessalon Perles 100 mg p.o. t.i.d. p.r.n. for cough. 16.TriCor 48 mg p.o. daily. 17.Vicodin 5/100 one tab p.o. t.i.d. p.r.n. for arthritis pain.  LABORATORY AND STUDIES:  On July 18, 2010, CBC, white blood cell count 5.7, hemoglobin 11.2, hematocrit 35.0, platelets 193.  Basic metabolic panel sodium 139, potassium 3.9, chloride 101, CO2 of 29, glucose 107, BUN 20, creatinine 1.69, calcium 9.9.  Cardiac enzymes negative x2.  B-type natriuretic peptide 173.  On July 19, 2010, CBC white blood cell count 5.5, hemoglobin 10.4, hematocrit 32.5, platelets 188, sodium 148, potassium is 3.9, chloride 105, CO2 of 29, glucose 60, BUN 25, creatinine 1.62,  calcium 9.1.  RADIOLOGY:  Chest x-ray on July 18, 2010, showed cardiomegaly without acute disease.  BRIEF HOSPITAL COURSE:  [name] is an 77 year old female with past medical history of hypertension, hyperlipidemia, and diabetes mellitus type 2 who presented to [facility] with continuing wheezing and shortness of breath.  She had been treated for bronchitis as an outpatient.  However, she felt like she was getting worse, not better.  She was admitted to the hospital. 1. Bronchitis.  The patient was now on azithromycin and ceftriaxone in     the emergency room for concern of pneumonia, however, chest x-ray     was clear so those medicines were stopped.  The patient was given     albuterol nebulizers and oral prednisone and her oxygen requirement     and respiratory exam improved.  The patient was walked with a pulse     ox and her pulse ox did not drop below 91%.  The patient was     discharged home with an albuterol inhaler and a prednisone taper. 2. Chronic medical problems, diabetes, hyperlipidemia, and     hypertension.  She was placed on her home medications and was  well     controlled throughout her hospitalization.  FOLLOWUP ISSUES AND RECOMMENDATIONS:  The patient is to follow up in 1-2 weeks with Dr. Jeanice Lim at [facility].  The patient was discharged home in stable medical condition.    ______________________________ Ardyth Gal, MD   ______________________________ Arnette Norris. Sheffield Slider, M.D.    CR/MEDQ  D:  07/19/2010  T:  07/20/2010  Job:  [id number]  Electronically Signed by Ardyth Gal MD on 07/21/2010 06:57:06 PM Electronically Signed by Zachery Dauer M.D. on 07/26/2010 06:37:00 PM

## 2010-07-29 ENCOUNTER — Encounter (HOSPITAL_COMMUNITY)
Admission: RE | Admit: 2010-07-29 | Discharge: 2010-07-30 | Payer: Self-pay | Source: Home / Self Care | Attending: Nephrology | Admitting: Nephrology

## 2010-08-01 NOTE — Progress Notes (Signed)
Summary: Test Res  Phone Note Call from Patient Call back at Home Phone [phone]   Caller: Patient Summary of Call: Pt checking on stool card results and also wondering when she should be seen again? Initial call taken by: Clydell Hakim,  September 05, 2009 3:43 PM  Follow-up for Phone Call        patient advised that stools card were negative. will send message to MD to ask when she needs to see patient again. Follow-up by: Theresia Lo RN,  September 05, 2009 4:00 PM  Additional Follow-up for Phone Call Additional follow up Details #1::        Pt can return in 3 months  Additional Follow-up by: [name] MD,  September 05, 2009 6:02 PM     Appended Document: Test Res patient notified.

## 2010-08-01 NOTE — Miscellaneous (Signed)
  Clinical Lists Changes  Problems: Changed problem from RENAL INSUFFICIENCY, CHRONIC (ICD-585.9) to RENAL DISEASE, CHRONIC, STAGE III (ICD-585.3) Observations: Added new observation of PAST MED HX: Current Problems:  NEOPLASM, KIDNEY (ICD-239.5) RENAL INSUFFICIENCY, CHRONIC (ICD-585.9)-Stage III OSTEOARTHRITIS, MULTI SITES (ICD-715.98) NEUROPATHY, PERIPHERAL (ICD-356.9) HYPERTENSION, BENIGN SYSTEMIC (ICD-401.1) HYPERLIPIDEMIA (ICD-272.4) DIABETES MELLITUS II, UNCOMPLICATED (ICD-250.00) Echo July 2011- EF 60%, Grade I diastolic dysfunction  (04/10/2010 23:30)      Past Medical History:    Current Problems:     NEOPLASM, KIDNEY (ICD-239.5)    RENAL INSUFFICIENCY, CHRONIC (ICD-585.9)-Stage III    OSTEOARTHRITIS, MULTI SITES (ICD-715.98)    NEUROPATHY, PERIPHERAL (ICD-356.9)    HYPERTENSION, BENIGN SYSTEMIC (ICD-401.1)    HYPERLIPIDEMIA (ICD-272.4)    DIABETES MELLITUS II, UNCOMPLICATED (ICD-250.00)    Echo July 2011- EF 60%, Grade I diastolic dysfunction

## 2010-08-01 NOTE — Progress Notes (Signed)
Summary: phn msg  Phone Note Call from Patient Call back at Home Phone [phone]   Caller: Patient Summary of Call: fell a couple of days ago and hit her head - also legs are bothering her and feet are swelling needs to talk to nurse Initial call taken by: De Nurse,  May 01, 2010 9:44 AM  Follow-up for Phone Call        [place] day before yesterday while walking out of [employer].  Hit head on curb.  Did not lose consciousness.  EMS was called, evaulated pt at scene and did not feel she needed to be transported to ED.  No c/o ha, dizziness, n&v, or visual changes.  Did sustain a small hematoma above left eye, small amount of bleeding, no stitches.  Pt mosly concerned about swelling in LE which she says is new for her.  Swelling does improve once she's off her feet.  Offered her an appt today but she said she could not come.  Gave her an appt in [place] clinic tomorrow.  She says she would love to see Luretha Murphy if at all possible.  Told her I would pass along the message. Follow-up by: Dennison Nancy RN,  May 01, 2010 9:54 AM

## 2010-08-01 NOTE — Consult Note (Signed)
Summary: [facility]   [facility]   Imported By: Clydell Hakim 10/18/2009 12:11:53  _____________________________________________________________________  External Attachment:    Type:   Image     Comment:   External Document

## 2010-08-01 NOTE — Consult Note (Signed)
Summary: [facility]   Imported By: Clydell Hakim 07/26/2009 15:44:39  _____________________________________________________________________  External Attachment:    Type:   Image     Comment:   External Document

## 2010-08-01 NOTE — Assessment & Plan Note (Signed)
Summary: F/U/KH   Vital Signs:  Patient profile:   77 year old female Weight:      171.6 pounds O2 Sat:      96 % on Room air Pulse rate:   80 / minute BP sitting:   106 / 64  (right arm) Cuff size:   regular  Vitals Entered By: Arlyss Repress CMA, (March 25, 2010 9:07 AM)  O2 Flow:  Room air CC: f/up cough and congestion Is Patient Diabetic? Yes Pain Assessment Patient in pain? no        Primary Care Provider:  [name] MD  CC:  f/up cough and congestion.  History of Present Illness:   Pt seen 9/9 for cough and congestion, viral URI, chest X-ray neg for any acute process. Has been using Tessalon perrles as needed, cough improved, also had a componenet for [place] per HPI, has been staying upright s/p meals. No chest pain, no SOB, no fever, drinking fluids, good appetite Husband occ smokes aournd her per report  Habits & Providers  Alcohol-Tobacco-Diet     Tobacco Status: never  Current Medications (verified): 1)  Bayer Childrens Aspirin 81 Mg Chew (Aspirin) .... Take 1 Tablet By Mouth Once A Day 2)  Lipitor 80 Mg Tabs (Atorvastatin Calcium) .... Take 1 Tab Daily 3)  Tricor 48 Mg Tabs (Fenofibrate) .... Take 1 Tablet By Mouth Once A Day 4)  Coreg 6.25 Mg  Tabs (Carvedilol) .Marland Kitchen.. 1 By Mouth Twice Daily 5)  Omeprazole 20 Mg  Cpdr (Omeprazole) .Marland Kitchen.. 1 By Mouth Daily 6)  Norvasc 10 Mg Tabs (Amlodipine Besylate) .Marland Kitchen.. 1 By Mouth Daily 7)  Procrit 16109 Unit/ml Soln (Epoetin Alfa) .... Per Dr. Arrie Aran With Cka 8)  Calcium 600+d 600-200 Mg-Unit Tabs (Calcium Carbonate-Vitamin D) .Marland Kitchen.. 1 By Mouth Two Times A Day 9)  Gabapentin 300 Mg Caps (Gabapentin) .... Take 1 By Mouth At Bedtime 10)  Glipizide Xl 10 Mg Xr24h-Tab (Glipizide) .... Take One Tablet Daily 11)  Iron 325 (65 Fe) Mg Tabs (Ferrous Sulfate) .Marland Kitchen.. 1 Tablet Daily 12)  Vitamin B-12 500 Mcg Tabs (Cyanocobalamin) .... 2 Tabs Daily 13)  Fish Oil 1000 Mg Caps (Omega-3 Fatty Acids) .... 2 Tabs Daily 14)  Vicodin  5-500 Mg Tabs (Hydrocodone-Acetaminophen) .Marland Kitchen.. 1 By Mouth Three Times A Day As Needed Arthritic Pain 15)  Tessalon Perles 100 Mg Caps (Benzonatate) .... One Three Times A Day As Needed Cough  Allergies (verified): No Known Drug Allergies  Past History:  Past Medical History: Last updated: 01/22/2010 Current Problems:  NEOPLASM, KIDNEY (ICD-239.5) RENAL INSUFFICIENCY, CHRONIC (ICD-585.9) OSTEOARTHRITIS, MULTI SITES (ICD-715.98) NEUROPATHY, PERIPHERAL (ICD-356.9) HYPERTENSION, BENIGN SYSTEMIC (ICD-401.1) HYPERLIPIDEMIA (ICD-272.4) DIABETES MELLITUS II, UNCOMPLICATED (ICD-250.00) Echo July 2011- EF 60%, Grade I diastolic dysfunction  Physical Exam  General:  Well-developed,well-nourished,in no acute distress; alert,appropriate and cooperative throughout examination, Vital signs noted  Mouth:  MMM Lungs:  Lungs CTAB, mild bibasilar crackles, normal WOB, no wheeze,no rales  Heart:  RRR, soft 2/6 systolic murmur  good pulse Pulses:  pulses equal bilat   Impression & Recommendations:  Problem # 1:  COUGH (ICD-786.2) Assessment Improved  Like URI, no red flags, see instructions, supportive care RTC with husband for Flu shot  Orders: [facility]- Est Level  3 (60454)  Complete Medication List: 1)  Bayer Childrens Aspirin 81 Mg Chew (Aspirin) .... Take 1 tablet by mouth once a day 2)  Lipitor 80 Mg Tabs (Atorvastatin calcium) .... Take 1 tab daily 3)  Tricor 48 Mg Tabs (Fenofibrate) .Marland KitchenMarland KitchenMarland Kitchen  Take 1 tablet by mouth once a day 4)  Coreg 6.25 Mg Tabs (Carvedilol) .Marland Kitchen.. 1 by mouth twice daily 5)  Omeprazole 20 Mg Cpdr (Omeprazole) .Marland Kitchen.. 1 by mouth daily 6)  Norvasc 10 Mg Tabs (Amlodipine besylate) .Marland Kitchen.. 1 by mouth daily 7)  Procrit 04540 Unit/ml Soln (Epoetin alfa) .... Per dr. Arrie Aran with cka 8)  Calcium 600+d 600-200 Mg-unit Tabs (Calcium carbonate-vitamin d) .Marland Kitchen.. 1 by mouth two times a day 9)  Gabapentin 300 Mg Caps (Gabapentin) .... Take 1 by mouth at bedtime 10)  Glipizide Xl 10 Mg  Xr24h-tab (Glipizide) .... Take one tablet daily 11)  Iron 325 (65 Fe) Mg Tabs (Ferrous sulfate) .Marland Kitchen.. 1 tablet daily 12)  Vitamin B-12 500 Mcg Tabs (Cyanocobalamin) .... 2 tabs daily 13)  Fish Oil 1000 Mg Caps (Omega-3 fatty acids) .... 2 tabs daily 14)  Vicodin 5-500 Mg Tabs (Hydrocodone-acetaminophen) .Marland Kitchen.. 1 by mouth three times a day as needed arthritic pain 15)  Tessalon Perles 100 Mg Caps (Benzonatate) .... One three times a day as needed cough  Other Orders: [facility] ([id number])  Patient Instructions: 1)  Return for your Flu shot on Oct 3rd 2)  Make your 6 month visit for January  3)  Return if you have fever, difficulty breathing, or chest pain  4)  HbA1C 6.2%  Laboratory Results   Blood Tests   Date/Time Received: March 25, 2010 9:21 AM  Date/Time Reported: March 25, 2010 9:33 AM   HGBA1C: 6.2%   (Normal Range: Non-Diabetic - 3-6%   Control Diabetic - 6-8%)  Comments: ...........test performed by...........Marland KitchenTerese Door, CMA

## 2010-08-01 NOTE — Miscellaneous (Signed)
  Clinical Lists Changes  Observations: Added new observation of COLONRECACT: Refused (07/09/2010 8:37) Added new observation of MAMMRECACT: Not indicated (07/09/2010 8:37) Added new observation of DEXARECACT: Not indicated (07/09/2010 8:37) Added new observation of PNEUMVXNEXT: 06/07/1995 (07/09/2010 8:37) Added new observation of PNEUVAXDECLN: Not indicated (07/09/2010 8:37)      Prevention & Chronic Care Immunizations   Influenza vaccine: Fluvax MCR  (04/01/2010)   Influenza vaccine due: 03/16/2009    Tetanus booster: 06/30/1998: Done.   Tetanus booster due: 06/30/2008    Pneumococcal vaccine: Done.  (06/30/1997)   Pneumococcal vaccine deferral: Not indicated  (07/09/2010)   Pneumococcal vaccine due: 06/07/1995    H. zoster vaccine: 11/30/2007: Zostavax  Colorectal Screening   Hemoccult: Done.  (09/28/2004)   Hemoccult due: Not Indicated    Colonoscopy: Not documented   Colonoscopy action/deferral: Refused  (07/09/2010)   Colonoscopy due: Refused  (09/05/2008)  Other Screening   Pap smear: Done.  (08/01/2003)   Pap smear due: Not Indicated    Mammogram: normal  (04/30/2009)   Mammogram action/deferral: Not indicated  (07/09/2010)   Mammogram due: 04/30/2010    DXA bone density scan: Done.  (11/28/2001)   DXA bone density action/deferral: Not indicated  (07/09/2010)   DXA scan due: None    Smoking status: never  (03/25/2010)  Diabetes Mellitus   HgbA1C: 6.2  (03/25/2010)   Hemoglobin A1C due: 06/01/2008    Eye exam: normal  (04/03/2009)   Eye exam due: 03/2010    Foot exam: yes  (04/04/2009)   High risk foot: Not documented   Foot care education: Not documented   Foot exam due: 10/11/2009    Urine microalbumin/creatinine ratio: Not documented   Urine microalbumin/cr due: 01/20/2008  Lipids   Total Cholesterol: 150  (10/17/2009)   LDL: 63  (10/17/2009)   LDL Direct: 90  (04/04/2009)   HDL: 65  (10/17/2009)   Triglycerides: 109  (10/17/2009)   SGOT (AST): 14  (04/04/2009)   SGPT (ALT): 10  (04/04/2009)   Alkaline phosphatase: 40  (04/04/2009)   Total bilirubin: 0.7  (04/04/2009)  Hypertension   Last Blood Pressure: 116 / 81  (05/02/2010)   Serum creatinine: 1.80  (05/02/2010)   Serum potassium 4.8  (05/02/2010)  Self-Management Support :   Personal Goals (by the next clinic visit) :     Personal A1C goal: 7  (04/04/2009)     Personal blood pressure goal: 130/80  (04/04/2009)     Personal LDL goal: 100  (04/04/2009)    Diabetes self-management support: Not documented    Hypertension self-management support: Not documented    Lipid self-management support: Not documented

## 2010-08-01 NOTE — Progress Notes (Signed)
Summary: triage  Phone Note Call from Patient Call back at Home Phone [phone]   Caller: Patient Summary of Call: Pt has cough that is producing yellow stuff.  Some shortness of breath. Initial call taken by: Clydell Hakim,  March 07, 2010 8:42 AM  Follow-up for Phone Call        started "a few months ago" . states she is not sick & does not have a cold. wants an xray done. told her she will need to be seen. unable to come in today. appt with [facility] in the am Follow-up by: Golden Circle RN,  March 07, 2010 9:09 AM

## 2010-08-01 NOTE — Assessment & Plan Note (Signed)
Summary: f/u,df   Vital Signs:  Patient profile:   77 year old female Weight:      170.7 pounds Pulse rate:   88 / minute BP sitting:   132 / 75  (right arm)  Vitals Entered By: Arlyss Repress CMA, (July 09, 2010 10:21 AM) CC: f/up DM, HTN Is Patient Diabetic? No Pain Assessment Patient in pain? no        Primary Care Provider:  [name] MD  CC:  f/up DM and HTN.  History of Present Illness:  1. HTN- takes all meds as prescribed, No chest pain, SOB, Tolerating meds, does not take bp regulary . Denies leg swelling  2. DM- does not take BS regulary,  only when she does not feel well, lowest 47 yesterday on a BMET taken by renal,Came up to 130's with eating a fudgecicle . a. Tolerating glipizde, reviewed last set of diabetes labs.   Continues to take neurontin for neuropathy  3. Fall- s/p fall no recent falls or unsteadiness, has rescheduled with PT for gait and strengthning   Will see Renal this month  Current Medications (verified): 1)  Bayer Childrens Aspirin 81 Mg Chew (Aspirin) .... Take 1 Tablet By Mouth Once A Day 2)  Lipitor 80 Mg Tabs (Atorvastatin Calcium) .... Take 1 Tab Daily 3)  Tricor 48 Mg Tabs (Fenofibrate) .... Take 1 Tablet By Mouth Once A Day 4)  Coreg 6.25 Mg  Tabs (Carvedilol) .Marland Kitchen.. 1 By Mouth Twice Daily 5)  Omeprazole 20 Mg  Cpdr (Omeprazole) .Marland Kitchen.. 1 By Mouth Daily 6)  Norvasc 10 Mg Tabs (Amlodipine Besylate) .Marland Kitchen.. 1 By Mouth Daily 7)  Procrit 04540 Unit/ml Soln (Epoetin Alfa) .... Per Dr. Arrie Aran With Cka 8)  Calcium 600+d 600-200 Mg-Unit Tabs (Calcium Carbonate-Vitamin D) .Marland Kitchen.. 1 By Mouth Two Times A Day 9)  Gabapentin 300 Mg Caps (Gabapentin) .... Take 1 By Mouth At Bedtime 10)  Glipizide Xl 10 Mg Xr24h-Tab (Glipizide) .... Take One Tablet Daily 11)  Iron 325 (65 Fe) Mg Tabs (Ferrous Sulfate) .Marland Kitchen.. 1 Tablet Daily 12)  Vitamin B-12 500 Mcg Tabs (Cyanocobalamin) .... 2 Tabs Daily 13)  Fish Oil 1000 Mg Caps (Omega-3 Fatty Acids) .... 2 Tabs  Daily 14)  Vicodin 5-500 Mg Tabs (Hydrocodone-Acetaminophen) .Marland Kitchen.. 1 By Mouth Three Times A Day As Needed Arthritic Pain  Allergies (verified): No Known Drug Allergies  Past History:  Past Medical History: Last updated: 04/10/2010 Current Problems:  NEOPLASM, KIDNEY (ICD-239.5) RENAL INSUFFICIENCY, CHRONIC (ICD-585.9)-Stage III OSTEOARTHRITIS, MULTI SITES (ICD-715.98) NEUROPATHY, PERIPHERAL (ICD-356.9) HYPERTENSION, BENIGN SYSTEMIC (ICD-401.1) HYPERLIPIDEMIA (ICD-272.4) DIABETES MELLITUS II, UNCOMPLICATED (ICD-250.00) Echo July 2011- EF 60%, Grade I diastolic dysfunction  Review of Systems  The patient denies weight loss, chest pain, syncope, prolonged cough, abdominal pain, and muscle weakness.           no leg swelling   Physical Exam  General:  NAD, Vital signs noted  Head:  small 0.5cm knot on right forehead, decreased from previous injury  Neck:  No carotid bruit  Lungs:  Lungs CTAB, mild bibasilar crackles, normal WOB, no wheeze,no rales  Heart:  RRR, soft 2/6 systolic murmur   Pulses:  pulses equal bilat Extremities:  no edema  Diabetes Management Exam:    Foot Exam (with socks and/or shoes not present):       Sensory-Pinprick/Light touch:          Left medial foot (L-4): normal          Left dorsal foot (L-5):  normal          Left lateral foot (S-1): normal          Right medial foot (L-4): normal          Right dorsal foot (L-5): normal          Right lateral foot (S-1): normal       Sensory-Monofilament:          Left foot: normal          Right foot: normal       Inspection:          Left foot: abnormal             Comments: Bialteral callus Buion on right foot          Right foot: abnormal       Nails:          Left foot: normal          Right foot: normal    Foot Exam by Podiatrist:       Date: 07/01/2010       Results: no diabetic findings       Done by: Roque Lias Podiatriy   Impression & Recommendations:  Problem # 1:  DIABETES MELLITUS  II, UNCOMPLICATED (ICD-250.00) Assessment Unchanged A1c stable Her updated medication list for this problem includes:    Bayer Childrens Aspirin 81 Mg Chew (Aspirin) .Marland Kitchen... Take 1 tablet by mouth once a day    Glipizide Xl 10 Mg Xr24h-tab (Glipizide) .Marland Kitchen... Take one tablet daily  Orders: A1C-[facility] (24401) [facility]- Est  Level 4 (02725)  Labs Reviewed: Creat: 1.80 (05/02/2010)   Microalbumin: 1+ (01/20/2007)  Last Eye Exam: normal (04/03/2009) Reviewed HgBA1c results: 6.0 (07/09/2010)  6.2 (03/25/2010)  Problem # 2:  HYPERTENSION, BENIGN SYSTEMIC (ICD-401.1) Assessment: Unchanged  Continue current  meds, no ACEI secondary to renal failure, pt to see Neprhology this Month Her updated medication list for this problem includes:    Coreg 6.25 Mg Tabs (Carvedilol) .Marland Kitchen... 1 by mouth twice daily    Norvasc 10 Mg Tabs (Amlodipine besylate) .Marland Kitchen... 1 by mouth daily  Orders: [facility]- Est  Level 4 (36644)  Problem # 3:  ABNORMALITY OF GAIT (ICD-781.2) Assessment: Unchanged  Reschedule PT  Orders: [facility]- Est  Level 4 (03474)  Problem # 4:  DIASTOLIC DYSFUNCTION (ICD-429.9) Assessment: Unchanged  No diuretics needed, compensated at this time  Orders: [facility]- Est  Level 4 (25956)  Complete Medication List: 1)  Bayer Childrens Aspirin 81 Mg Chew (Aspirin) .... Take 1 tablet by mouth once a day 2)  Lipitor 80 Mg Tabs (Atorvastatin calcium) .... Take 1 tab daily 3)  Tricor 48 Mg Tabs (Fenofibrate) .... Take 1 tablet by mouth once a day 4)  Coreg 6.25 Mg Tabs (Carvedilol) .Marland Kitchen.. 1 by mouth twice daily 5)  Omeprazole 20 Mg Cpdr (Omeprazole) .Marland Kitchen.. 1 by mouth daily 6)  Norvasc 10 Mg Tabs (Amlodipine besylate) .Marland Kitchen.. 1 by mouth daily 7)  Procrit 38756 Unit/ml Soln (Epoetin alfa) .... Per dr. Arrie Aran with cka 8)  Calcium 600+d 600-200 Mg-unit Tabs (Calcium carbonate-vitamin d) .Marland Kitchen.. 1 by mouth two times a day 9)  Gabapentin 300 Mg Caps (Gabapentin) .... Take 1 by mouth at bedtime 10)  Glipizide Xl 10 Mg  Xr24h-tab (Glipizide) .... Take one tablet daily 11)  Iron 325 (65 Fe) Mg Tabs (Ferrous sulfate) .Marland Kitchen.. 1 tablet daily 12)  Vitamin B-12 500 Mcg Tabs (Cyanocobalamin) .... 2 tabs daily 13)  Fish Oil 1000  Mg Caps (Omega-3 fatty acids) .... 2 tabs daily 14)  Vicodin 5-500 Mg Tabs (Hydrocodone-acetaminophen) .Marland Kitchen.. 1 by mouth three times a day as needed arthritic pain  Patient Instructions: 1)  Next appt in June 2012 2)  Call if you need refills 3)  You A1C was 6.0% 4)  Please follow-up with your therapy    Orders Added: 1)  A1C-[facility] [83036] 2)  [facility]- Est  Level 4 [08657]    Laboratory Results   Blood Tests   Date/Time Received: July 09, 2010 10:25 AM  Date/Time Reported: July 09, 2010 10:38 AM   HGBA1C: 6.0%   (Normal Range: Non-Diabetic - 3-6%   Control Diabetic - 6-8%)  Comments: ...............test performed by......Marland KitchenBonnie A. [place], MLS (ASCP)cm       Prevention & Chronic Care Immunizations   Influenza vaccine: Fluvax MCR  (04/01/2010)   Influenza vaccine due: 03/16/2009    Tetanus booster: 06/30/1998: Done.   Td booster deferral: Refused  (07/09/2010)   Tetanus booster due: 06/30/2008    Pneumococcal vaccine: Done.  (06/30/1997)   Pneumococcal vaccine deferral: Not indicated  (07/09/2010)   Pneumococcal vaccine due: 06/07/1995    H. zoster vaccine: 11/30/2007: Zostavax  Colorectal Screening   Hemoccult: Done.  (09/28/2004)   Hemoccult due: Not Indicated    Colonoscopy: Not documented   Colonoscopy action/deferral: Refused  (07/09/2010)   Colonoscopy due: Refused  (09/05/2008)  Other Screening   Pap smear: Done.  (08/01/2003)   Pap smear due: Not Indicated    Mammogram: normal  (04/30/2009)   Mammogram action/deferral: Not indicated  (07/09/2010)   Mammogram due: 04/30/2010    DXA bone density scan: Done.  (11/28/2001)   DXA bone density action/deferral: Not indicated  (07/09/2010)   DXA scan due: None    Smoking status: never   (03/25/2010)  Diabetes Mellitus   HgbA1C: 6.0  (07/09/2010)   Hemoglobin A1C due: 06/01/2008    Eye exam: normal  (04/03/2009)   Eye exam due: 03/2010    Foot exam: yes  (07/09/2010)   High risk foot: Not documented   Foot care education: Not documented   Foot exam due: 10/11/2009    Urine microalbumin/creatinine ratio: Not documented   Urine microalbumin/cr due: 01/20/2008    Diabetes flowsheet reviewed?: Yes   Progress toward A1C goal: At goal  Lipids   Total Cholesterol: 150  (10/17/2009)   LDL: 63  (10/17/2009)   LDL Direct: 90  (04/04/2009)   HDL: 65  (10/17/2009)   Triglycerides: 109  (10/17/2009)    SGOT (AST): 14  (04/04/2009)   SGPT (ALT): 10  (04/04/2009)   Alkaline phosphatase: 40  (04/04/2009)   Total bilirubin: 0.7  (04/04/2009)    Lipid flowsheet reviewed?: Yes   Progress toward LDL goal: At goal  Hypertension   Last Blood Pressure: 132 / 75  (07/09/2010)   Serum creatinine: 1.80  (05/02/2010)   Serum potassium 4.8  (05/02/2010)    Hypertension flowsheet reviewed?: Yes   Progress toward BP goal: At goal  Self-Management Support :   Personal Goals (by the next clinic visit) :     Personal A1C goal: 7  (04/04/2009)     Personal blood pressure goal: 130/80  (04/04/2009)     Personal LDL goal: 100  (04/04/2009)    Diabetes self-management support: Not documented    Hypertension self-management support: Not documented    Lipid self-management support: Not documented

## 2010-08-01 NOTE — Progress Notes (Signed)
Summary: phn msg  Phone Note Call from Patient Call back at Home Phone [phone]   Caller: Patient Summary of Call: has a cold and wants to know what she can take b/c of her kidney disease Initial call taken by: De Nurse,  July 16, 2010 9:10 AM  Follow-up for Phone Call        She can take Guaifenesin 200mg  every 4 hours, if she does not improve over the next 24 hours,she should  come in to be seen Follow-up by: [name] MD,  July 16, 2010 9:33 AM  Additional Follow-up for Phone Call Additional follow up Details #1::        called pt. informed of above message. pt agreed. Additional Follow-up by: Arlyss Repress CMA,,  July 16, 2010 9:54 AM

## 2010-08-01 NOTE — Progress Notes (Signed)
  Phone Note Call from Patient   Caller: Patient Summary of Call: call from pt told that she would be called with X-ray results.  Told pt I could look them up and no final read at this time but no signs of infection but does look like she has some bronchitis.  Told her to continue on her treatment plan at this time and can call us if she gets worse.  Pt is feeling much better.  Initial call taken by: Antoine Primas DO,  July 16, 2010 8:29 PM

## 2010-08-01 NOTE — Assessment & Plan Note (Signed)
Summary: sob & productive cough/[place]   Vital Signs:  Patient profile:   77 year old female Height:      67.5 inches Weight:      171 pounds BMI:     26.48 Temp:     97.8 degrees F oral Pulse rate:   79 / minute BP sitting:   129 / 66  (right arm) Cuff size:   regular  Vitals Entered By: Tessie Fass CMA (March 08, 2010 10:48 AM) CC: SOB, productive cough Is Patient Diabetic? Yes Pain Assessment Patient in pain? no        Primary Care Zhania Shaheen:  [name] MD  CC:  SOB and productive cough.  History of Present Illness: Has been coughing for a 'while'.  She is worried that she may have cancer.  Denies recent viral illness, denies nasal discharge.  Does have GERD and lays down after eating, also reports acid and food sometimes coming up.  Recent ECHO, diastolic dysfunction.  Habits & Providers  Alcohol-Tobacco-Diet     Tobacco Status: never  Current Medications (verified): 1)  Bayer Childrens Aspirin 81 Mg Chew (Aspirin) .... Take 1 Tablet By Mouth Once A Day 2)  Lipitor 80 Mg Tabs (Atorvastatin Calcium) .... Take 1 Tab Daily 3)  Tricor 48 Mg Tabs (Fenofibrate) .... Take 1 Tablet By Mouth Once A Day 4)  Coreg 6.25 Mg  Tabs (Carvedilol) .Marland Kitchen.. 1 By Mouth Twice Daily 5)  Omeprazole 20 Mg  Cpdr (Omeprazole) .Marland Kitchen.. 1 By Mouth Daily 6)  Norvasc 10 Mg Tabs (Amlodipine Besylate) .Marland Kitchen.. 1 By Mouth Daily 7)  Procrit 29562 Unit/ml Soln (Epoetin Alfa) .... Per Dr. Arrie Aran With Cka 8)  Calcium 600+d 600-200 Mg-Unit Tabs (Calcium Carbonate-Vitamin D) .Marland Kitchen.. 1 By Mouth Two Times A Day 9)  Gabapentin 300 Mg Caps (Gabapentin) .... Take 1 By Mouth At Bedtime 10)  Glipizide Xl 10 Mg Xr24h-Tab (Glipizide) .... Take One Tablet Daily 11)  Iron 325 (65 Fe) Mg Tabs (Ferrous Sulfate) .Marland Kitchen.. 1 Tablet Daily 12)  Vitamin B-12 500 Mcg Tabs (Cyanocobalamin) .... 2 Tabs Daily 13)  Fish Oil 1000 Mg Caps (Omega-3 Fatty Acids) .... 2 Tabs Daily 14)  Vicodin 5-500 Mg Tabs  (Hydrocodone-Acetaminophen) .Marland Kitchen.. 1 By Mouth Three Times A Day As Needed Arthritic Pain 15)  Tessalon Perles 100 Mg Caps (Benzonatate) .... One Three Times A Day As Needed Cough  Allergies (verified): No Known Drug Allergies  Review of Systems General:  Denies fever and sweats. ENT:  Denies earache, hoarseness, nasal congestion, postnasal drainage, sinus pressure, and sore throat. CV:  Denies chest pain or discomfort. Resp:  Complains of cough and shortness of breath. GI:  Complains of indigestion; denies abdominal pain.   Impression & Recommendations:  Problem # 1:  COUGH (ICD-786.2) Normal CXR, sounds like she is more worried about what may happen to her as she ages.  She may have a component of reflux, she is on a PPI but does lay down after eating.  Discussed anti-reflux measures. Orders: CXR- 2view (CXR) [facility]- Est Level  3 (13086)  Complete Medication List: 1)  Bayer Childrens Aspirin 81 Mg Chew (Aspirin) .... Take 1 tablet by mouth once a day 2)  Lipitor 80 Mg Tabs (Atorvastatin calcium) .... Take 1 tab daily 3)  Tricor 48 Mg Tabs (Fenofibrate) .... Take 1 tablet by mouth once a day 4)  Coreg 6.25 Mg Tabs (Carvedilol) .Marland Kitchen.. 1 by mouth twice daily 5)  Omeprazole 20 Mg Cpdr (Omeprazole) .Marland Kitchen.. 1 by  mouth daily 6)  Norvasc 10 Mg Tabs (Amlodipine besylate) .Marland Kitchen.. 1 by mouth daily 7)  Procrit 42595 Unit/ml Soln (Epoetin alfa) .... Per dr. Arrie Aran with cka 8)  Calcium 600+d 600-200 Mg-unit Tabs (Calcium carbonate-vitamin d) .Marland Kitchen.. 1 by mouth two times a day 9)  Gabapentin 300 Mg Caps (Gabapentin) .... Take 1 by mouth at bedtime 10)  Glipizide Xl 10 Mg Xr24h-tab (Glipizide) .... Take one tablet daily 11)  Iron 325 (65 Fe) Mg Tabs (Ferrous sulfate) .Marland Kitchen.. 1 tablet daily 12)  Vitamin B-12 500 Mcg Tabs (Cyanocobalamin) .... 2 tabs daily 13)  Fish Oil 1000 Mg Caps (Omega-3 fatty acids) .... 2 tabs daily 14)  Vicodin 5-500 Mg Tabs (Hydrocodone-acetaminophen) .Marland Kitchen.. 1 by mouth three times a day as  needed arthritic pain 15)  Tessalon Perles 100 Mg Caps (Benzonatate) .... One three times a day as needed cough  Patient Instructions: 1)  Please schedule a follow-up appointment in 2 weeks with [facility]  2)  Do not lay down after eating for 2 hours 3)  Try not to overeat\par 4)  Get CXR today Prescriptions: TESSALON PERLES 100 MG CAPS (BENZONATATE) one three times a day as needed cough  #30 x 0   Entered and Authorized by:   Luretha Murphy NP   Signed by:   Luretha Murphy NP on 03/08/2010   Method used:   Electronically to        [facility] [phone]* (retail)       [address]       [place], [place]  [address]       Ph: [phone]       Fax: [phone]   RxID:   [id number] TESSALON PERLES 100 MG CAPS (BENZONATATE) one three times a day as needed cough  #30 x 0   Entered and Authorized by:   Luretha Murphy NP   Signed by:   Luretha Murphy NP on 03/08/2010   Method used:   Electronically to        [facility]  [address] [phone]* (retail)       [address]       [place], [place]  [address]       Ph: [phone]       Fax: [phone]   RxID:   [id number]

## 2010-08-01 NOTE — Assessment & Plan Note (Signed)
Summary: f/up,tcb   Vital Signs:  Patient profile:   77 year old female Height:      67.5 inches Weight:      168 pounds BMI:     26.02 Temp:     97.6 degrees F oral Pulse rate:   81 / minute BP sitting:   106 / 63  (left arm) Cuff size:   regular  Vitals Entered By: Tessie Fass CMA (January 04, 2010 8:57 AM) CC: F/U diabetes/HTN Is Patient Diabetic? Yes Pain Assessment Patient in pain? no        Primary Care Provider:  [name] MD  CC:  F/U diabetes/HTN.  History of Present Illness: 1. HTN- takes all meds as prescribed, No chest pain, SOB, Tolerating meds, does not take bp regulary at home. Occasionaly SOB, husband smokes cigars in front in her, worse with exertion such as walking up stairs, denies cough, fever, wheezing. Denies leg swelling  2. DM- does not take BS regulary,  only when she does not feel well, lowest 77, takes OJ when she feels weak. No polyuria. Tolerating glipizde, reviewed last set of diabetes labs.   Continues to take neurontin for neuropathy    Will see Renal this month Asking which medicine on her list she can take for arthritis- explained NSAIDS not the best for her with renal disease     Habits & Providers  Alcohol-Tobacco-Diet     Tobacco Status: never  Current Medications (verified): 1)  Bayer Childrens Aspirin 81 Mg Chew (Aspirin) .... Take 1 Tablet By Mouth Once A Day 2)  Lipitor 80 Mg Tabs (Atorvastatin Calcium) .... Take 1 Tab Daily 3)  Tricor 48 Mg Tabs (Fenofibrate) .... Take 1 Tablet By Mouth Once A Day 4)  Coreg 6.25 Mg  Tabs (Carvedilol) .Marland Kitchen.. 1 By Mouth Twice Daily 5)  Omeprazole 20 Mg  Cpdr (Omeprazole) .Marland Kitchen.. 1 By Mouth Daily 6)  Norvasc 10 Mg Tabs (Amlodipine Besylate) .Marland Kitchen.. 1 By Mouth Daily 7)  Procrit 40981 Unit/ml Soln (Epoetin Alfa) .... Per Dr. Arrie Aran With Cka 8)  Calcium 600+d 600-200 Mg-Unit Tabs (Calcium Carbonate-Vitamin D) .Marland Kitchen.. 1 By Mouth Two Times A Day 9)  Gabapentin 300 Mg Caps (Gabapentin) .... Take 1  By Mouth At Bedtime 10)  Glipizide Xl 10 Mg Xr24h-Tab (Glipizide) .... Take One Tablet Daily 11)  Iron 325 (65 Fe) Mg Tabs (Ferrous Sulfate) .Marland Kitchen.. 1 Tablet Daily 12)  Vitamin B-12 500 Mcg Tabs (Cyanocobalamin) .... 2 Tabs Daily 13)  Fish Oil 1000 Mg Caps (Omega-3 Fatty Acids) .... 2 Tabs Daily 14)  Vicodin 5-500 Mg Tabs (Hydrocodone-Acetaminophen) .Marland Kitchen.. 1 By Mouth Three Times A Day As Needed Arthritic Pain  Allergies (verified): No Known Drug Allergies  Physical Exam  General:  Well-developed,well-nourished,in no acute distress; alert,appropriate and cooperative throughout examination, Vital signs noted  Neck:  No carotid bruit  Lungs:  Lungs CTAB, no crackles no wheeze, normal WOB Heart:  RRR, ? 2/6 murmur at lower sternal border, does not radiates,  no gallop Pulses:  pulses equal bilat Extremities:  no edema   Impression & Recommendations:  Problem # 1:  DYSPNEA ON EXERTION (ICD-786.09) Assessment New  Will send for ECHO with ? new murmur and symptoms, non smoker therefore lung etiology less likely with normal exam, consider CXR Her updated medication list for this problem includes:    Coreg 6.25 Mg Tabs (Carvedilol) .Marland Kitchen... 1 by mouth twice daily  Orders: 2 D Echo (2 D Echo) [facility]- Est  Level 4 (  98119)  Problem # 2:  DIABETES MELLITUS II, UNCOMPLICATED (ICD-250.00) Assessment: Unchanged  No change to meds, Her updated medication list for this problem includes:    Bayer Childrens Aspirin 81 Mg Chew (Aspirin) .Marland Kitchen... Take 1 tablet by mouth once a day    Glipizide Xl 10 Mg Xr24h-tab (Glipizide) .Marland Kitchen... Take one tablet daily  Labs Reviewed: Creat: 1.54 (08/17/2009)   Microalbumin: 1+ (01/20/2007)  Last Eye Exam: normal (04/03/2009) Reviewed HgBA1c results: 6.1 (10/17/2009)  6.1 (07/09/2009)  Orders: [facility]- Est  Level 4 (14782)  Problem # 3:  HYPERTENSION, BENIGN SYSTEMIC (ICD-401.1) Assessment: Unchanged  At goal, BP slighlty low today but pt assymptomatic Her updated  medication list for this problem includes:    Coreg 6.25 Mg Tabs (Carvedilol) .Marland Kitchen... 1 by mouth twice daily    Norvasc 10 Mg Tabs (Amlodipine besylate) .Marland Kitchen... 1 by mouth daily  BP today: 106/63 Prior BP: 120/50 (10/17/2009)    Labs Reviewed: K+: 4.5 (08/17/2009) Creat: : 1.54 (08/17/2009)   Chol: 150 (10/17/2009)   HDL: 65 (10/17/2009)   LDL: 63 (10/17/2009)   TG: 109 (10/17/2009)  Orders: [facility]- Est  Level 4 (95621)  Complete Medication List: 1)  Bayer Childrens Aspirin 81 Mg Chew (Aspirin) .... Take 1 tablet by mouth once a day 2)  Lipitor 80 Mg Tabs (Atorvastatin calcium) .... Take 1 tab daily 3)  Tricor 48 Mg Tabs (Fenofibrate) .... Take 1 tablet by mouth once a day 4)  Coreg 6.25 Mg Tabs (Carvedilol) .Marland Kitchen.. 1 by mouth twice daily 5)  Omeprazole 20 Mg Cpdr (Omeprazole) .Marland Kitchen.. 1 by mouth daily 6)  Norvasc 10 Mg Tabs (Amlodipine besylate) .Marland Kitchen.. 1 by mouth daily 7)  Procrit 30865 Unit/ml Soln (Epoetin alfa) .... Per dr. Arrie Aran with cka 8)  Calcium 600+d 600-200 Mg-unit Tabs (Calcium carbonate-vitamin d) .Marland Kitchen.. 1 by mouth two times a day 9)  Gabapentin 300 Mg Caps (Gabapentin) .... Take 1 by mouth at bedtime 10)  Glipizide Xl 10 Mg Xr24h-tab (Glipizide) .... Take one tablet daily 11)  Iron 325 (65 Fe) Mg Tabs (Ferrous sulfate) .Marland Kitchen.. 1 tablet daily 12)  Vitamin B-12 500 Mcg Tabs (Cyanocobalamin) .... 2 tabs daily 13)  Fish Oil 1000 Mg Caps (Omega-3 fatty acids) .... 2 tabs daily 14)  Vicodin 5-500 Mg Tabs (Hydrocodone-acetaminophen) .Marland Kitchen.. 1 by mouth three times a day as needed arthritic pain  Patient Instructions: 1)  Next appt in 3 months 2)  I will send you for an ECHO of your heart for the shortness of breath 3)  Your A1C in APril was 6.1%- this is great 4)  Continue your other medications 5)  For your arthritis take the Vicodin as three times a day  Prescriptions: VICODIN 5-500 MG TABS (HYDROCODONE-ACETAMINOPHEN) 1 by mouth three times a day as needed arthritic pain  #60 x 3    Entered and Authorized by:   [name] MD   Signed by:   [name] MD on 01/04/2010   Method used:   Telephoned to ...       [facility] [address] [phone]* (retail)       [address]       [place], [place]  [address]       Ph: [phone]       Fax: [phone]   RxID:   [id number]    Prevention & Chronic Care Immunizations   Influenza vaccine: Fluvax MCR  (03/16/2008)   Influenza vaccine due: 03/16/2009    Tetanus booster: 06/30/1998: Done.  Tetanus booster due: 06/30/2008    Pneumococcal vaccine: Done.  (06/30/1997)   Pneumococcal vaccine due: None    H. zoster vaccine: 11/30/2007: Zostavax  Colorectal Screening   Hemoccult: Done.  (09/28/2004)   Hemoccult due: Not Indicated    Colonoscopy: Not documented   Colonoscopy due: Refused  (09/05/2008)  Other Screening   Pap smear: Done.  (08/01/2003)   Pap smear due: Not Indicated    Mammogram: normal  (04/30/2009)   Mammogram due: 04/30/2010    DXA bone density scan: Done.  (11/28/2001)   DXA scan due: None    Smoking status: never  (01/04/2010)  Diabetes Mellitus   HgbA1C: 6.1  (10/17/2009)   Hemoglobin A1C due: 06/01/2008    Eye exam: normal  (04/03/2009)   Eye exam due: 03/2010    Foot exam: yes  (04/04/2009)   High risk foot: Not documented   Foot care education: Not documented   Foot exam due: 10/11/2009    Urine microalbumin/creatinine ratio: Not documented   Urine microalbumin/cr due: 01/20/2008    Diabetes flowsheet reviewed?: Yes   Progress toward A1C goal: At goal  Lipids   Total Cholesterol: 150  (10/17/2009)   LDL: 63  (10/17/2009)   LDL Direct: 90  (04/04/2009)   HDL: 65  (10/17/2009)   Triglycerides: 109  (10/17/2009)    SGOT (AST): 14  (04/04/2009)   SGPT (ALT): 10  (04/04/2009)   Alkaline phosphatase: 40  (04/04/2009)   Total bilirubin: 0.7  (04/04/2009)  Hypertension   Last Blood Pressure: 106 / 63  (01/04/2010)   Serum creatinine: 1.54  (08/17/2009)   Serum  potassium 4.5  (08/17/2009)    Hypertension flowsheet reviewed?: Yes   Progress toward BP goal: At goal  Self-Management Support :   Personal Goals (by the next clinic visit) :     Personal A1C goal: 7  (04/04/2009)     Personal blood pressure goal: 130/80  (04/04/2009)     Personal LDL goal: 100  (04/04/2009)    Diabetes self-management support: Not documented    Hypertension self-management support: Not documented    Lipid self-management support: Not documented

## 2010-08-01 NOTE — Assessment & Plan Note (Signed)
Summary: flu shot,df  Nurse Visit   Vital Signs:  Patient profile:   77 year old female Temp:     97.9 degrees F  Vitals Entered By: Theresia Lo RN (April 01, 2010 9:42 AM)  Allergies: No Known Drug Allergies  Immunizations Administered:  Influenza Vaccine # 1:    Vaccine Type: Fluvax MCR    Site: right deltoid    Mfr: GlaxoSmithKline    Dose: 0.5 ml    Route: IM    Given by: Theresia Lo RN    Exp. Date: 12/25/2010    Lot #: [id number]    VIS given: 01/22/10 version given April 01, 2010.  Flu Vaccine Consent Questions:    Do you have a history of severe allergic reactions to this vaccine? no    Any prior history of allergic reactions to egg and/or gelatin? no    Do you have a sensitivity to the preservative Thimersol? no    Do you have a past history of Guillan-Barre Syndrome? no    Do you currently have an acute febrile illness? no    Have you ever had a severe reaction to latex? no    Vaccine information given and explained to patient? yes    Are you currently pregnant? no  Orders Added: 1)  Influenza Vaccine MCR [00025] 2)  Influenza MCR [00025]  Appended Document: Orders Update    Clinical Lists Changes  Orders: Added new Service order of Administration Flu vaccine - MCR (G0008) - Signed Added new Service order of Flu Vaccine 43yrs + MEDICARE PATIENTS (U9811) - Signed

## 2010-08-01 NOTE — Assessment & Plan Note (Signed)
Summary: congested/shortness of breath/bmc   Vital Signs:  Patient profile:   77 year old female Height:      67.5 inches Weight:      170 pounds BMI:     26.33 O2 Sat:      90 % on Room air Temp:     97.9 degrees F oral Pulse rate:   81 / minute BP sitting:   125 / 74  (left arm) Cuff size:   regular  Vitals Entered By: Tessie Fass CMA (July 16, 2010 3:20 PM) CC: congestion, SOB   Primary Provider:  [name] MD  CC:  congestion and SOB.  History of Present Illness: 1. Congestion. Has been feeling sick for past 4 days. Cough productive of yellow sputum, congestion in her chest and dyspnea that is worse with lying down. Also has runny nose and watery eyes. Wheezing at times. No history of asthma or personal hx of smoking, has been exposed to second-hand smoke at times.  Denies fever, chills, n/v, LE edema. No sick contacts.   2. Deconditioning. Patient brings up the topic of physical therapy. She states that Luretha Murphy had referred her for this due to some weakness, and the patient refused initially. She now wants to pursuit this, but was told by PT agency that she needs a new referral.   Allergies: No Known Drug Allergies  Past History:  Past Medical History: Last updated: 04/10/2010 Current Problems:  NEOPLASM, KIDNEY (ICD-239.5) RENAL INSUFFICIENCY, CHRONIC (ICD-585.9)-Stage III OSTEOARTHRITIS, MULTI SITES (ICD-715.98) NEUROPATHY, PERIPHERAL (ICD-356.9) HYPERTENSION, BENIGN SYSTEMIC (ICD-401.1) HYPERLIPIDEMIA (ICD-272.4) DIABETES MELLITUS II, UNCOMPLICATED (ICD-250.00) Echo July 2011- EF 60%, Grade I diastolic dysfunction  Past Surgical History: Last updated: 12/11/2008 hx cholescystectomy  Family History: Last updated: 2006/09/14 Father died at 23 from ? CAD., Mother died at 22 from `old age`.  Social History: Last updated: 09/14/2006 Lives in [place] with husband, [name].  Does not work.  Risk Factors: Smoking Status: never (03/25/2010) Passive  Smoke Exposure: yes (06/28/2009)  Review of Systems  The patient denies anorexia, fever, weight loss, hoarseness, chest pain, peripheral edema, headaches, and hemoptysis.    Physical Exam  General:  alert, well-developed, well-nourished, and well-hydrated.   Head:  normocephalic and atraumatic.   Eyes:  PERRLA, EOMI, bilateral tearing.  Nose:  Clear rhinorrhea. Mouth:  Oral mucosa and oropharynx without lesions or exudates.  Teeth in good repair. Lungs:  Diffuse inspiratory and expiratory wheezes. Few right basilar rales. Air movement restricted.  Heart:  Normal rate and regular rhythm. S1 and S2 normal without gallop, murmur, click, rub or other extra sounds. Abdomen:  Bowel sounds positive,abdomen soft and non-tender without masses, organomegaly or hernias noted. Pulses:  2+ bilateral DP pulses. Extremities:  No clubbing, cyanosis, edema, or deformity noted with normal full range of motion of all joints.   Neurologic:  No cranial nerve deficits noted. Station and gait are normal. Plantar reflexes are down-going bilaterally. DTRs are symmetrical throughout. Sensory, motor and coordinative functions appear intact.   Impression & Recommendations:  Problem # 1:  COUGH (ICD-786.2) No fever, but given her age we treated empirically to cover for CAP organisms. Gave Rocephin IM and will start azithromycin. Chest xray performed after visit does not show evidence of pneumonia, and this is likely c/w bronchitis. With her severe wheezing and borderline  hypoxia (90-93% on RA pre-treatment), nebulizer was also administered and lung sounds were much impoved with minimal wheezing post-treatment (O2 sat 93-94%). Also gave as needed albuterol inhaler for home  use. Patient to f/u in clinic in next 2 days. Return to ED if fevers develop or symptoms worsen.  Orders: CXR- 2view (CXR) Rocephin  250mg  (B1478) Albuterol Sulfate Sol 1mg  unit dose (G9562) [facility]- Est  Level 4 (13086)  Problem # 2:  ABNORMALITY  OF GAIT (ICD-781.2)  Patient desires new referral for PT to aid her mobility. Will defer to PCP once this acute illness is stable.   Orders: [facility]- Est  Level 4 (57846)  Complete Medication List: 1)  Bayer Childrens Aspirin 81 Mg Chew (Aspirin) .... Take 1 tablet by mouth once a day 2)  Lipitor 80 Mg Tabs (Atorvastatin calcium) .... Take 1 tab daily 3)  Tricor 48 Mg Tabs (Fenofibrate) .... Take 1 tablet by mouth once a day 4)  Coreg 6.25 Mg Tabs (Carvedilol) .Marland Kitchen.. 1 by mouth twice daily 5)  Omeprazole 20 Mg Cpdr (Omeprazole) .Marland Kitchen.. 1 by mouth daily 6)  Norvasc 10 Mg Tabs (Amlodipine besylate) .Marland Kitchen.. 1 by mouth daily 7)  Procrit 96295 Unit/ml Soln (Epoetin alfa) .... Per dr. Arrie Aran with cka 8)  Calcium 600+d 600-200 Mg-unit Tabs (Calcium carbonate-vitamin d) .Marland Kitchen.. 1 by mouth two times a day 9)  Gabapentin 300 Mg Caps (Gabapentin) .... Take 1 by mouth at bedtime 10)  Glipizide Xl 10 Mg Xr24h-tab (Glipizide) .... Take one tablet daily 11)  Iron 325 (65 Fe) Mg Tabs (Ferrous sulfate) .Marland Kitchen.. 1 tablet daily 12)  Vitamin B-12 500 Mcg Tabs (Cyanocobalamin) .... 2 tabs daily 13)  Fish Oil 1000 Mg Caps (Omega-3 fatty acids) .... 2 tabs daily 14)  Vicodin 5-500 Mg Tabs (Hydrocodone-acetaminophen) .Marland Kitchen.. 1 by mouth three times a day as needed arthritic pain 15)  Ventolin Hfa 108 (90 Base) Mcg/act Aers (Albuterol sulfate) .... Take 2 puffs every 4 hours as needed for shortness of breath 16)  Azithromycin 500 Mg Tabs (Azithromycin) .... Take one tab by mouth daily for 3 days.  Patient Instructions: 1)  Nice to meet you. 2)  Please pick up antibiotics at [facility] [address] 3)  You can take inhaler when you feel short of breath. 4)  Get your chest x ray today. 5)  I will call you if you need more treatment. 6)  Please call clinic if you have fevers, increased shortness of breath or you don't feel better in next 2 days.  Prescriptions: VENTOLIN HFA 108 (90 BASE) MCG/ACT AERS (ALBUTEROL SULFATE) Take 2 puffs  every 4 hours as needed for shortness of breath  #1 x 0   Entered and Authorized by:   Lloyd Huger MD   Signed by:   Lloyd Huger MD on 07/16/2010   Method used:   Electronically to        [facility] [phone]* (retail)       [address]       [place], [place]  [address]       Ph: [phone]       Fax: [phone]   RxID:   [id number] AZITHROMYCIN 500 MG TABS (AZITHROMYCIN) take one tab by mouth daily for 3 days.  #3 x 0   Entered and Authorized by:   Lloyd Huger MD   Signed by:   Lloyd Huger MD on 07/16/2010   Method used:   Electronically to        [facility] [phone]* (retail)       [address]       [place], [place]  [address]       Ph:  [phone]       Fax: [phone]   RxID:   [id number]    Medication Administration  Injection # 1:    Medication: Rocephin  250mg     Diagnosis: COUGH (ICD-786.2)    Route: IM    Site: RUOQ gluteus    Exp Date: 10/28/2012    Lot #: [id number]    Mfr: hopira    Patient tolerated injection without complications    Given by: Arlyss Repress CMA, (July 16, 2010 4:35PM)  Medication # 1:    Medication: Albuterol Sulfate Sol 1mg  unit dose    Diagnosis: COUGH (ICD-786.2)    Dose: 2.5/38ml    Route: inhaled    Exp Date: 12/29/2011    Lot #: [id number]    Mfr: nephron    Patient tolerated medication without complications    Given by: Arlyss Repress CMA, (July 16, 2010 4:44 PM)  Orders Added: 1)  CXR- 2view [CXR] 2)  Rocephin  250mg  [J0696] 3)  Albuterol Sulfate Sol 1mg  unit dose [J7613] 4)  [facility]- Est  Level 4 [16606]     Medication Administration  Injection # 1:    Medication: Rocephin  250mg     Diagnosis: COUGH (ICD-786.2)    Route: IM    Site: RUOQ gluteus    Exp Date: 10/28/2012    Lot #: [id number]    Mfr: hopira    Patient tolerated injection without complications    Given by: Arlyss Repress CMA, (July 16, 2010 4:35PM)  Medication # 1:    Medication: Albuterol Sulfate Sol 1mg  unit dose     Diagnosis: COUGH (ICD-786.2)    Dose: 2.5/68ml    Route: inhaled    Exp Date: 12/29/2011    Lot #: [id number]    Mfr: nephron    Patient tolerated medication without complications    Given by: Arlyss Repress CMA, (July 16, 2010 4:44 PM)  Orders Added: 1)  CXR- 2view [CXR] 2)  Rocephin  250mg  [J0696] 3)  Albuterol Sulfate Sol 1mg  unit dose [J7613] 4)  [facility]- Est  Level 4 [57322]

## 2010-08-01 NOTE — Consult Note (Signed)
Summary: [facility]  [facility]   Imported By: Clydell Hakim 01/31/2010 12:09:04  _____________________________________________________________________  External Attachment:    Type:   Image     Comment:   External Document

## 2010-08-01 NOTE — Assessment & Plan Note (Signed)
Summary: CONGESTED/SOB/[facility]   Vital Signs:  Patient profile:   77 year old female Height:      67.5 inches Weight:      169 pounds Temp:     98.1 degrees F oral Pulse rate:   84 / minute Pulse rhythm:   regular BP sitting:   135 / 69  (right arm) Cuff size:   regular  Vitals Entered By: Loralee Pacas CMA (July 17, 2010 3:40 PM) CC: congestion, SOB   Primary Care Provider:  [name] MD  CC:  congestion and SOB.  History of Present Illness:    Pt seen yesterday for SOB, and productive cough, started on Azithromycin and given IM rocephin and Ventolin. Pt responded well to albuterol treatment during visit, has been using inhaler as directed used 3 times yesterday and twice this AM, but still feels SOB and congsted. No fever, no chest pain, no rhinorrhea    Also needs another referral for PT Wants to have Neurontin increased for neuropathy in feet and legs, taking 300mg  at bedtime currently  Habits & Providers  Alcohol-Tobacco-Diet     Tobacco Status: never     Passive Smoke Exposure: yes  Current Medications (verified): 1)  Bayer Childrens Aspirin 81 Mg Chew (Aspirin) .... Take 1 Tablet By Mouth Once A Day 2)  Lipitor 80 Mg Tabs (Atorvastatin Calcium) .... Take 1 Tab Daily 3)  Tricor 48 Mg Tabs (Fenofibrate) .... Take 1 Tablet By Mouth Once A Day 4)  Coreg 6.25 Mg  Tabs (Carvedilol) .Marland Kitchen.. 1 By Mouth Twice Daily 5)  Omeprazole 20 Mg  Cpdr (Omeprazole) .Marland Kitchen.. 1 By Mouth Daily 6)  Norvasc 10 Mg Tabs (Amlodipine Besylate) .Marland Kitchen.. 1 By Mouth Daily 7)  Procrit 16109 Unit/ml Soln (Epoetin Alfa) .... Per Dr. Arrie Aran With Cka 8)  Calcium 600+d 600-200 Mg-Unit Tabs (Calcium Carbonate-Vitamin D) .Marland Kitchen.. 1 By Mouth Two Times A Day 9)  Gabapentin 300 Mg Caps (Gabapentin) .... Take 1 By Mouth At Bedtime 10)  Glipizide Xl 10 Mg Xr24h-Tab (Glipizide) .... Take One Tablet Daily 11)  Iron 325 (65 Fe) Mg Tabs (Ferrous Sulfate) .Marland Kitchen.. 1 Tablet Daily 12)  Vitamin B-12 500 Mcg Tabs  (Cyanocobalamin) .... 2 Tabs Daily 13)  Fish Oil 1000 Mg Caps (Omega-3 Fatty Acids) .... 2 Tabs Daily 14)  Vicodin 5-500 Mg Tabs (Hydrocodone-Acetaminophen) .Marland Kitchen.. 1 By Mouth Three Times A Day As Needed Arthritic Pain 15)  Ventolin Hfa 108 (90 Base) Mcg/act Aers (Albuterol Sulfate) .... Take 2 Puffs Every 4 Hours As Needed For Shortness of Breath 16)  Azithromycin 500 Mg Tabs (Azithromycin) .... Take One Tab By Mouth Daily For 3 Days. 17)  Tessalon Perles 100 Mg Caps (Benzonatate) .Marland Kitchen.. 1 By Mouth Three Times A Day As Needed  Allergies (verified): No Known Drug Allergies  Review of Systems       See HPI  Physical Exam  General:  NAD, sick appearing Vital signs noted  Mouth:  MMM Neck:  no LAD Lungs:  Decreasd air movmeent bilat, bilat expiratory wheeze, decreased air at bases, no rhonci increased WOB , able to speak in short sentences with audible wheeze  s/p neb Improved air movmennt, few scattered wheeze Heart:  RRR  2/6 systolic murmur Abdomen:  soft, non-tender, normal bowel sounds, and no distention.   Pulses:  2+ bilateral Extremities:  no edema   Impression & Recommendations:  Problem # 1:  ACUTE BRONCHITIS (ICD-466.0) Assessment Unchanged On recheck today, concerned with minimal improvement and exam today, will  cover for CAP , give total of 5 days of antibiotics, second shot of Rocephin given, oxygen low normal. Good response to brochodilator though no history of lung disease. See instructions for albuterol below and red flags. Pt able to converse at baseline s/p neb treatment, f/u in AM, if no signigcant change will admit for closer observation Initial CXR- no infilrate seen Her updated medication list for this problem includes:    Ventolin Hfa 108 (90 Base) Mcg/act Aers (Albuterol sulfate) .Marland Kitchen... Take 2 puffs every 4 hours as needed for shortness of breath    Azithromycin 500 Mg Tabs (Azithromycin) .Marland Kitchen... Take one tab by mouth daily for 3 days.    Tessalon Perles 100 Mg  Caps (Benzonatate) .Marland Kitchen... 1 by mouth three times a day as needed  Orders: [facility]- Est Level  3 ([address])  Problem # 2:  NEUROPATHY, PERIPHERAL (ICD-356.9) Assessment: Deteriorated Increase Gabapentin to 300mg  two times a day   Complete Medication List: 1)  Bayer Childrens Aspirin 81 Mg Chew (Aspirin) .... Take 1 tablet by mouth once a day 2)  Lipitor 80 Mg Tabs (Atorvastatin calcium) .... Take 1 tab daily 3)  Tricor 48 Mg Tabs (Fenofibrate) .... Take 1 tablet by mouth once a day 4)  Coreg 6.25 Mg Tabs (Carvedilol) .Marland Kitchen.. 1 by mouth twice daily 5)  Omeprazole 20 Mg Cpdr (Omeprazole) .Marland Kitchen.. 1 by mouth daily 6)  Norvasc 10 Mg Tabs (Amlodipine besylate) .Marland Kitchen.. 1 by mouth daily 7)  Procrit 60454 Unit/ml Soln (Epoetin alfa) .... Per dr. Arrie Aran with cka 8)  Calcium 600+d 600-200 Mg-unit Tabs (Calcium carbonate-vitamin d) .Marland Kitchen.. 1 by mouth two times a day 9)  Gabapentin 300 Mg Caps (Gabapentin) .... Take 1 by mouth two times a day 10)  Glipizide Xl 10 Mg Xr24h-tab (Glipizide) .... Take one tablet daily 11)  Iron 325 (65 Fe) Mg Tabs (Ferrous sulfate) .Marland Kitchen.. 1 tablet daily 12)  Vitamin B-12 500 Mcg Tabs (Cyanocobalamin) .... 2 tabs daily 13)  Fish Oil 1000 Mg Caps (Omega-3 fatty acids) .... 2 tabs daily 14)  Vicodin 5-500 Mg Tabs (Hydrocodone-acetaminophen) .Marland Kitchen.. 1 by mouth three times a day as needed arthritic pain 15)  Ventolin Hfa 108 (90 Base) Mcg/act Aers (Albuterol sulfate) .... Take 2 puffs every 4 hours as needed for shortness of breath 16)  Azithromycin 500 Mg Tabs (Azithromycin) .... Take one tab by mouth daily for 3 days. 17)  Tessalon Perles 100 Mg Caps (Benzonatate) .Marland Kitchen.. 1 by mouth three times a day as needed  Other Orders: Rocephin  250mg  (U9811) Albuterol Sulfate Sol 1mg  unit dose (B1478) Ipratropium inhalation sol. unit dose (G9562) Physical Therapy Referral (PT)  Patient Instructions: 1)  Use your inhaler every 4 hours while awake take 2 puffs, if you need to - if your breathing gets  bad, take 1 puff in between the 4 hours  2)  I sent 2 more days of antibiotics 3)  Take 1 tablet tonight, then tomorrow and thrusday/Friday 4)  If your breathing gets worse go to the ER tonight  5)  Put Ms. Grantz on the work in schedule and I will see her- [place] Prescriptions: GABAPENTIN 300 MG CAPS (GABAPENTIN) take 1 by mouth two times a day  #60 x 6   Entered and Authorized by:   [name] MD   Signed by:   [name] MD on 07/17/2010   Method used:   Faxed to ...       [facility] (retail)             ,  [place]         Ph: [phone]       Fax: [phone]   RxID:   [id number] TESSALON PERLES 100 MG CAPS (BENZONATATE) 1 by mouth three times a day as needed  #12 x 0   Entered and Authorized by:   [name] MD   Signed by:   [name] MD on 07/17/2010   Method used:   Electronically to        [facility] [phone]* (retail)       [address]       [place], [place]  [address]       Ph: [phone]       Fax: [phone]   RxID:   [id number] AZITHROMYCIN 500 MG TABS (AZITHROMYCIN) take one tab by mouth daily for 3 days.  #2 x 0   Entered and Authorized by:   [name] MD   Signed by:   [name] MD on 07/17/2010   Method used:   Electronically to        [facility] [phone]* (retail)       [address]       [place], [place]  [address]       Ph: [phone]       Fax: [phone]   RxID:   [id number]    Medication Administration  Injection # 1:    Medication: Rocephin  250mg     Diagnosis: COUGH (ICD-786.2)    Route: IM    Site: RUOQ gluteus    Exp Date: 01/28/2013    Lot #: [id number]    Mfr: hospira    Comments: pt waited for 15 mins before leaving    Patient tolerated injection without complications    Given by: Loralee Pacas CMA (July 17, 2010 4:38 PM)  Medication # 1:    Medication: Albuterol Sulfate Sol 1mg  unit dose    Diagnosis: COUGH (ICD-786.2)    Dose: 2.5mg     Route: inhaled     Exp Date: 12/2011    Lot #: [id number]    Mfr: nephron    Patient tolerated medication without complications    Given by: Jimmy Footman, CMA (July 17, 2010 5:05 PM)  Medication # 2:    Medication: Ipratropium inhalation sol. unit dose    Diagnosis: COUGH (ICD-786.2)    Dose: 0.5mg     Route: inhaled    Exp Date: 12/2011    Lot #: [id number]    Mfr: nephron    Patient tolerated medication without complications    Given by: Jimmy Footman, CMA (July 17, 2010 5:05 PM)  Orders Added: 1)  Rocephin  250mg  [J0696] 2)  Albuterol Sulfate Sol 1mg  unit dose [J7613] 3)  Ipratropium inhalation sol. unit dose [J7644] 4)  [facility]- Est Level  3 [99213] 5)  Physical Therapy Referral [PT]

## 2010-08-01 NOTE — Progress Notes (Signed)
Summary: Emergency Line Call  [facility] called with c/o of bilateral ankle infections after recent biopsies at Podiatrist's office. States that she was put on Clindamycin 150 mg by mouth (2 tabs) q 6 hours. One foot slightly swollen "on this one part on the bottom of my foot." No increased erythema, swelling, pain, warmth, streaks, N/V/D, fever/chills, or AMS. She has Hx DM. Patient wondering if she can wait until tomorrow to be seen. Advised patient of red flags (as above), continue Clindamycin, keep wounds clean and continue to apply topical Abx. Advised patient to go to [facility] today if worsening. Helane Rima DO  January 27, 2010 10:21 AM

## 2010-08-01 NOTE — Assessment & Plan Note (Signed)
Summary: wants thyroid checked/[place]   Vital Signs:  Patient profile:   77 year old female Height:      67.5 inches Weight:      167.5 pounds BMI:     25.94 Pulse rate:   85 / minute BP sitting:   130 / 70  (right arm)  Vitals Entered By: Arlyss Repress CMA, (August 17, 2009 3:30 PM) CC: c/o hair loss, wt loss over last months. request blood work. thyroid check. Is Patient Diabetic? Yes Pain Assessment Patient in pain? yes     Location: right shoulder Intensity: 7 Onset of pain  Chronic   Primary Care Provider:  [name] MD  CC:  c/o hair loss and wt loss over last months. request blood work. thyroid check..  History of Present Illness:  Weight loss- pt concerned she has had weight loss for past few months. states clothes were looser but not she is filling back in. Denies early satiety, non smoker, declined colosnocpy,no change in bowel, no chest pain  Hair loss- noted her hair thinning over past year, denies large clumps of hair loss or scars/rashes in hair. Note pt saw hair dressor yesterday and she did not mention any hair loss to pt. She wants thyroid checked again  Arthritis- history of in knees and shoulders, tylenol arthritis not working now and asking if there is anything else she can take. Was previoulsy seen by ortho- Dr. Charlett Blake  Current Medications (verified): 1)  Bayer Childrens Aspirin 81 Mg Chew (Aspirin) .... Take 1 Tablet By Mouth Once A Day 2)  Lipitor 80 Mg Tabs (Atorvastatin Calcium) .... Take 1 Tab Daily 3)  Tricor 48 Mg Tabs (Fenofibrate) .... Take 1 Tablet By Mouth Once A Day 4)  Tylenol Arthritis Pain 650 Mg  Tbcr (Acetaminophen) .Marland Kitchen.. 1 By Mouth Every 6 Hours As Needed For Pain 5)  Coreg 6.25 Mg  Tabs (Carvedilol) .Marland Kitchen.. 1 By Mouth Twice Daily 6)  Omeprazole 20 Mg  Cpdr (Omeprazole) .Marland Kitchen.. 1 By Mouth Daily 7)  Norvasc 10 Mg Tabs (Amlodipine Besylate) .Marland Kitchen.. 1 By Mouth Daily 8)  Procrit 16109 Unit/ml Soln (Epoetin Alfa) .... Per Dr. Arrie Aran With  Cka 9)  Calcium 600+d 600-200 Mg-Unit Tabs (Calcium Carbonate-Vitamin D) .Marland Kitchen.. 1 By Mouth Two Times A Day 10)  Gabapentin 300 Mg Caps (Gabapentin) .... Take 1 By Mouth At Bedtime 11)  Glipizide Xl 10 Mg Xr24h-Tab (Glipizide) .... Take One Tablet Daily 12)  Iron 325 (65 Fe) Mg Tabs (Ferrous Sulfate) .Marland Kitchen.. 1 Tablet Daily 13)  Vitamin B-12 500 Mcg Tabs (Cyanocobalamin) .... 2 Tabs Daily 14)  Fish Oil 1000 Mg Caps (Omega-3 Fatty Acids) .... 2 Tabs Daily 15)  Ultram 50 Mg Tabs (Tramadol Hcl) .Marland Kitchen.. 1 By Mouth At Bedtime  Allergies (verified): No Known Drug Allergies  Physical Exam  General:  Well-developed,well-nourished,in no acute distress; alert,appropriate and cooperative throughout examination, Vital signs noted  Head:  Generalized fine hair,no alopecia noted, no scarring , no rash- healthy appearing scalp   Impression & Recommendations:  Problem # 1:  LOSS OF WEIGHT (ICD-783.21) Assessment New  No recent weight loss, per records pt  has lost 10 pounds since 2008. Will check TSH levels, discussed cancer screening which would be foremost for abnormal weight loss- Mammogram UTD, non-smoker, pt prefers Stool cards to repeat colonoscopy,uterus in tact last PAP 2005. Reassurance given at this stage  Orders: [facility]- Est  Level 4 ([address]454)  Problem # 2:  HAIR LOSS (ICD-704.00) Assessment: New No red flags  on exam. Likely Telogen Effluvian from age. Will check TSH levels, no scarring noted on exam. Pt also has known iron deficinecy treated by renal Orders: TSH-[facility] ([address]-60454) [facility]- Est  Level 4 (09811)  Problem # 3:  OSTEOARTHRITIS, MULTI SITES (ICD-715.98) Assessment: Unchanged  Will add Ultram as needed severe pain at bedtime, in low dose secondary to renal failure,, pt no longer taking Vicodin The following medications were removed from the medication list:    Vicodin 5-500 Mg Tabs (Hydrocodone-acetaminophen) .Marland Kitchen... 1 by mouth as needed pain Her updated medication list for this problem  includes:    Bayer Childrens Aspirin 81 Mg Chew (Aspirin) .Marland Kitchen... Take 1 tablet by mouth once a day    Tylenol Arthritis Pain 650 Mg Tbcr (Acetaminophen) .Marland Kitchen... 1 by mouth every 6 hours as needed for pain    Ultram 50 Mg Tabs (Tramadol hcl) .Marland Kitchen... 1 by mouth at bedtime  Orders: [facility]- Est  Level 4 ([address]1478)  Complete Medication List: 1)  Bayer Childrens Aspirin 81 Mg Chew (Aspirin) .... Take 1 tablet by mouth once a day 2)  Lipitor 80 Mg Tabs (Atorvastatin calcium) .... Take 1 tab daily 3)  Tricor 48 Mg Tabs (Fenofibrate) .... Take 1 tablet by mouth once a day 4)  Tylenol Arthritis Pain 650 Mg Tbcr (Acetaminophen) .Marland Kitchen.. 1 by mouth every 6 hours as needed for pain 5)  Coreg 6.25 Mg Tabs (Carvedilol) .Marland Kitchen.. 1 by mouth twice daily 6)  Omeprazole 20 Mg Cpdr (Omeprazole) .Marland Kitchen.. 1 by mouth daily 7)  Norvasc 10 Mg Tabs (Amlodipine besylate) .Marland Kitchen.. 1 by mouth daily 8)  Procrit 29562 Unit/ml Soln (Epoetin alfa) .... Per dr. Arrie Aran with cka 9)  Calcium 600+d 600-200 Mg-unit Tabs (Calcium carbonate-vitamin d) .Marland Kitchen.. 1 by mouth two times a day 10)  Gabapentin 300 Mg Caps (Gabapentin) .... Take 1 by mouth at bedtime 11)  Glipizide Xl 10 Mg Xr24h-tab (Glipizide) .... Take one tablet daily 12)  Iron 325 (65 Fe) Mg Tabs (Ferrous sulfate) .Marland Kitchen.. 1 tablet daily 13)  Vitamin B-12 500 Mcg Tabs (Cyanocobalamin) .... 2 tabs daily 14)  Fish Oil 1000 Mg Caps (Omega-3 fatty acids) .... 2 tabs daily 15)  Ultram 50 Mg Tabs (Tramadol hcl) .Marland Kitchen.. 1 by mouth at bedtime  Other Orders: [facility] [phone])  Patient Instructions: 1)  I will continue to watch your weight 2)  Please return the stool cards 3)  I will call you with your lab results on Monday  4)  For your arthritis take the pain medication once a day  Prescriptions: ULTRAM 50 MG TABS (TRAMADOL HCL) 1 by mouth at bedtime  #30 x 1   Entered and Authorized by:   [name] MD   Signed by:   [name] MD on 08/17/2009   Method used:   Electronically to          [facility] [phone]* (retail)       [address]       [place], [place]  [address]       Ph: [phone]       Fax: [phone]   RxID:   [id number]

## 2010-08-01 NOTE — Progress Notes (Signed)
Summary: results  Phone Note Call from Patient Call back at Home Phone [phone]   Caller: Patient Summary of Call: wants to know lab results Initial call taken by: De Nurse,  August 20, 2009 2:06 PM  Follow-up for Phone Call        will forward to MD. Follow-up by: Theresia Lo RN,  August 20, 2009 3:06 PM  Additional Follow-up for Phone Call Additional follow up Details #1::        Results given via phone, no new orders needed Additional Follow-up by: [name] MD,  August 20, 2009 4:47 PM

## 2010-08-01 NOTE — Progress Notes (Signed)
Summary: Lab Ques  Phone Note Call from Patient Call back at Home Phone [phone]   Caller: Patient Summary of Call: Pt usually has lab work the day she sees Dr. She is wondering can it be done tomorrow when her husband comes so that Dr. Jeanice Lim will have it on the 25th when she comes in for her appointment.  Can we call her back and let her know? Initial call taken by: Clydell Hakim,  October 11, 2009 9:52 AM  Follow-up for Phone Call        do you want to order labs now? Follow-up by: Golden Circle RN,  October 11, 2009 10:06 AM  Additional Follow-up for Phone Call Additional follow up Details #1::        She can get her cholesterol and complete blood count, no other labs are due. Additional Follow-up by: [name] MD,  October 11, 2009 2:39 PM    Additional Follow-up for Phone Call Additional follow up Details #2::    she will come next tuesday am for this Follow-up by: Golden Circle RN,  October 11, 2009 4:32 PM

## 2010-08-01 NOTE — Progress Notes (Signed)
Summary: Phone- ECHO results  Phone Note Outgoing Call   Call placed by: [name] MD,  January 23, 2010 2:17 PM Details for Reason: ECHO reports Summary of Call: I called and LVM, ECHO cardiogram for Nancy and [name] showed normal squeezing function (EF 60%) for both patients. Both had mild diastolic or relaxtion of the heart this is due to age and having high blood pressure, nothing to worry about. No change to medications is needed

## 2010-08-01 NOTE — Assessment & Plan Note (Signed)
Summary: f/u,df   Vital Signs:  Patient profile:   77 year old female Height:      67.5 inches Weight:      165 pounds BMI:     25.55 Temp:     97.5 degrees F oral Pulse rate:   80 / minute BP sitting:   120 / 50  (right arm) Cuff size:   regular  Vitals Entered By: Tessie Fass CMA (October 17, 2009 8:40 AM) CC: F/U diabetes and cholesterol Is Patient Diabetic? Yes Pain Assessment Patient in pain? yes     Location: left hip Intensity: 7   Primary Care Provider:  [name] MD  CC:  F/U diabetes and cholesterol.  History of Present Illness:   1. HTN- takes all meds as prescribed, No chest pain, SOB, Tolerating meds, does not take bp regulary at home  2. DM- does not take BS regulary, this AM 77, felt hot with and took orange juice,  takes Glipizide without difficulty. Occasional tingling in feet, neurontin helps but she does not take it daily only as needed   3. CKI- follow with Dr. Geanie Berlin Stage 3CKD  lab reviewed again from Feb  4. Arthritis- history of in knees and shoulders, tylenol arthritis not working neither is tramadol, feels like she has bursitis in left hip, has had injections in hip before does not feel like they work.          Habits & Providers  Alcohol-Tobacco-Diet     Tobacco Status: never  Current Medications (verified): 1)  Bayer Childrens Aspirin 81 Mg Chew (Aspirin) .... Take 1 Tablet By Mouth Once A Day 2)  Lipitor 80 Mg Tabs (Atorvastatin Calcium) .... Take 1 Tab Daily 3)  Tricor 48 Mg Tabs (Fenofibrate) .... Take 1 Tablet By Mouth Once A Day 4)  Coreg 6.25 Mg  Tabs (Carvedilol) .Marland Kitchen.. 1 By Mouth Twice Daily 5)  Omeprazole 20 Mg  Cpdr (Omeprazole) .Marland Kitchen.. 1 By Mouth Daily 6)  Norvasc 10 Mg Tabs (Amlodipine Besylate) .Marland Kitchen.. 1 By Mouth Daily 7)  Procrit 22025 Unit/ml Soln (Epoetin Alfa) .... Per Dr. Arrie Aran With Cka 8)  Calcium 600+d 600-200 Mg-Unit Tabs (Calcium Carbonate-Vitamin D) .Marland Kitchen.. 1 By Mouth Two Times A Day 9)  Gabapentin 300  Mg Caps (Gabapentin) .... Take 1 By Mouth At Bedtime 10)  Glipizide Xl 10 Mg Xr24h-Tab (Glipizide) .... Take One Tablet Daily 11)  Iron 325 (65 Fe) Mg Tabs (Ferrous Sulfate) .Marland Kitchen.. 1 Tablet Daily 12)  Vitamin B-12 500 Mcg Tabs (Cyanocobalamin) .... 2 Tabs Daily 13)  Fish Oil 1000 Mg Caps (Omega-3 Fatty Acids) .... 2 Tabs Daily 14)  Vicodin 5-500 Mg Tabs (Hydrocodone-Acetaminophen) .Marland Kitchen.. 1 By Mouth Q 6hrs As Needed Pain  Allergies (verified): No Known Drug Allergies  Physical Exam  General:  Well-developed,well-nourished,in no acute distress; alert,appropriate and cooperative throughout examination, Vital signs noted  Neck:  No carotid bruit  Lungs:  Lungs CTAB, no crackles no wheeze, normal WOB Heart:  RRR, no murmur Pulses:  pulses equal bilat Extremities:  no edema   Impression & Recommendations:  Problem # 1:  HYPERTENSION, BENIGN SYSTEMIC (ICD-401.1) Assessment Unchanged  Continue current meds Her updated medication list for this problem includes:    Coreg 6.25 Mg Tabs (Carvedilol) .Marland Kitchen... 1 by mouth twice daily    Norvasc 10 Mg Tabs (Amlodipine besylate) .Marland Kitchen... 1 by mouth daily  Orders: [facility]- Est  Level 4 (42706)  Problem # 2:  OSTEOARTHRITIS, MULTI SITES (ICD-715.98) Assessment: Deteriorated  Will try  vicodin again, I beleive pt gets a little confused about her pain medication, she does not want any other intervention for her "bursitis" The following medications were removed from the medication list:    Tylenol Arthritis Pain 650 Mg Tbcr (Acetaminophen) .Marland Kitchen... 1 by mouth every 6 hours as needed for pain    Ultram 50 Mg Tabs (Tramadol hcl) .Marland Kitchen... 1 by mouth at bedtime Her updated medication list for this problem includes:    Bayer Childrens Aspirin 81 Mg Chew (Aspirin) .Marland Kitchen... Take 1 tablet by mouth once a day    Vicodin 5-500 Mg Tabs (Hydrocodone-acetaminophen) .Marland Kitchen... 1 by mouth q 6hrs as needed pain  Orders: [facility]- Est  Level 4 (99214)  Problem # 3:  DIABETES MELLITUS II,  UNCOMPLICATED (ICD-250.00) Assessment: Unchanged  Her updated medication list for this problem includes:    Bayer Childrens Aspirin 81 Mg Chew (Aspirin) .Marland Kitchen... Take 1 tablet by mouth once a day    Glipizide Xl 10 Mg Xr24h-tab (Glipizide) .Marland Kitchen... Take one tablet daily  Orders: [facility] (60454) [facility]- Est  Level 4 (09811)  Problem # 4:  NEUROPATHY, PERIPHERAL (ICD-356.9) Assessment: Deteriorated  reiterated to pt to take neurontin every night, new script sent to mail order likley secondary to DM/HTN  Orders: [facility]- Est  Level 4 (91478)  Complete Medication List: 1)  Bayer Childrens Aspirin 81 Mg Chew (Aspirin) .... Take 1 tablet by mouth once a day 2)  Lipitor 80 Mg Tabs (Atorvastatin calcium) .... Take 1 tab daily 3)  Tricor 48 Mg Tabs (Fenofibrate) .... Take 1 tablet by mouth once a day 4)  Coreg 6.25 Mg Tabs (Carvedilol) .Marland Kitchen.. 1 by mouth twice daily 5)  Omeprazole 20 Mg Cpdr (Omeprazole) .Marland Kitchen.. 1 by mouth daily 6)  Norvasc 10 Mg Tabs (Amlodipine besylate) .Marland Kitchen.. 1 by mouth daily 7)  Procrit 29562 Unit/ml Soln (Epoetin alfa) .... Per dr. Arrie Aran with cka 8)  Calcium 600+d 600-200 Mg-unit Tabs (Calcium carbonate-vitamin d) .Marland Kitchen.. 1 by mouth two times a day 9)  Gabapentin 300 Mg Caps (Gabapentin) .... Take 1 by mouth at bedtime 10)  Glipizide Xl 10 Mg Xr24h-tab (Glipizide) .... Take one tablet daily 11)  Iron 325 (65 Fe) Mg Tabs (Ferrous sulfate) .Marland Kitchen.. 1 tablet daily 12)  Vitamin B-12 500 Mcg Tabs (Cyanocobalamin) .... 2 tabs daily 13)  Fish Oil 1000 Mg Caps (Omega-3 fatty acids) .... 2 tabs daily 14)  Vicodin 5-500 Mg Tabs (Hydrocodone-acetaminophen) .Marland Kitchen.. 1 by mouth q 6hrs as needed pain  Patient Instructions: 1)    Try to take your blood sugars and bring to next visit 2)    Continue your medications 3)    Restart the neurotin 300mg  at night time 4)    Return in 3 months  5)   start the pain pill as needed  Prescriptions: GABAPENTIN 300 MG CAPS (GABAPENTIN) take 1 by mouth at bedtime   #90 x 1   Entered and Authorized by:   [name] MD   Signed by:   [name] MD on 10/17/2009   Method used:   Telephoned to ...       [facility]             , [place]         Ph:        Fax: [phone]   RxID:   [id number] VICODIN 5-500 MG TABS (HYDROCODONE-ACETAMINOPHEN) 1 by mouth q 6hrs as needed pain  #40 x 0   Entered and  Authorized by:   [name] MD   Signed by:   [name] MD on 10/17/2009   Method used:   Telephoned to ...       [facility]             , [place]         Ph:        Fax: [phone]   RxID:   [id number]    Prevention & Chronic Care Immunizations   Influenza vaccine: Fluvax MCR  (03/16/2008)   Influenza vaccine due: 03/16/2009    Tetanus booster: 06/30/1998: Done.   Tetanus booster due: 06/30/2008    Pneumococcal vaccine: Done.  (06/30/1997)   Pneumococcal vaccine due: None    H. zoster vaccine: 11/30/2007: Zostavax  Colorectal Screening   Hemoccult: Done.  (09/28/2004)   Hemoccult due: Not Indicated    Colonoscopy: Not documented   Colonoscopy due: Refused  (09/05/2008)  Other Screening   Pap smear: Done.  (08/01/2003)   Pap smear due: Not Indicated    Mammogram: normal  (04/30/2009)   Mammogram due: 04/30/2010    DXA bone density scan: Done.  (11/28/2001)   DXA scan due: None    Smoking status: never  (10/17/2009)  Diabetes Mellitus   HgbA1C: 6.1  (10/17/2009)   Hemoglobin A1C due: 06/01/2008    Eye exam: normal  (04/03/2009)   Eye exam due: 03/2010    Foot exam: yes  (04/04/2009)   High risk foot: Not documented   Foot care education: Not documented   Foot exam due: 10/11/2009    Urine microalbumin/creatinine ratio: Not documented   Urine microalbumin/cr due: 01/20/2008    Diabetes flowsheet reviewed?: Yes   Progress toward A1C goal: At goal  Lipids   Total Cholesterol: 186  (09/14/2008)   LDL: 94   (09/14/2008)   LDL Direct: 90  (04/04/2009)   HDL: 53  (09/14/2008)   Triglycerides: 193  (09/14/2008)    SGOT (AST): 14  (04/04/2009)   SGPT (ALT): 10  (04/04/2009)   Alkaline phosphatase: 40  (04/04/2009)   Total bilirubin: 0.7  (04/04/2009)  Hypertension   Last Blood Pressure: 120 / 50  (10/17/2009)   Serum creatinine: 1.54  (08/17/2009)   Serum potassium 4.5  (08/17/2009)    Hypertension flowsheet reviewed?: Yes   Progress toward BP goal: At goal  Self-Management Support :   Personal Goals (by the next clinic visit) :     Personal A1C goal: 7  (04/04/2009)     Personal blood pressure goal: 130/80  (04/04/2009)     Personal LDL goal: 100  (04/04/2009)    Diabetes self-management support: Not documented    Hypertension self-management support: Not documented    Lipid self-management support: Not documented    Last Mammogram:  normal (04/13/2008 8:37:26 AM) Mammogram Result Date:  04/30/2009 Mammogram Result:  normal Mammogram Next Due:  1 yr  Laboratory Results   Blood Tests   Date/Time Received: October 17, 2009 9:16 AM  Date/Time Reported: October 17, 2009 10:32 AM   HGBA1C: 6.1%   (Normal Range: Non-Diabetic - 3-6%   Control Diabetic - 6-8%)  Comments: ...............test performed by......Marland KitchenBonnie A. [place], MLS (ASCP)cm

## 2010-08-01 NOTE — Progress Notes (Signed)
Summary: phn msg  Phone Note Call from Patient Call back at Home Phone [phone]   Caller: Patient Summary of Call: pt needs to ask a questions about DM Initial call taken by: De Nurse,  Nov 21, 2009 8:34 AM  Follow-up for Phone Call        cbg is 70s to 80s in the am.  advised bedtime snack of protien every night. 70 is cutting it close. symptomatic at that. states she drinks juice then food if symptomatic.   then asked about her spouse.  140 in am. eats fried foods. advised cutting them out as they have twice the calories as other food groups. offered appt with Dr. Gerilyn Pilgrim. she will let me know if desired Follow-up by: Golden Circle RN,  Nov 21, 2009 8:49 AM

## 2010-08-01 NOTE — Assessment & Plan Note (Signed)
Summary: f/u per [place]/eo   Vital Signs:  Patient profile:   77 year old female Weight:      167.8 pounds O2 Sat:      90 % on Room air Temp:     98 degrees F oral Pulse rate:   90 / minute BP sitting:   121 / 70  (right arm)  Vitals Entered By: Arlyss Repress CMA, (July 18, 2010 8:33 AM)  O2 Flow:  Room air CC: f/up bronchitis. 92% pulse ox after walking. still SOB, coughing. Is Patient Diabetic? Yes Pain Assessment Patient in pain? no        Primary Care Provider:  [name] MD  CC:  f/up bronchitis. 92% pulse ox after walking. still SOB and coughing.Marland Kitchen  History of Present Illness:    Pt seen yesterday for SOB, and productive cough concern for probable CAP, s/p 2 days of  Azithromycin and 2 days of  IM rocephin and Ventolin. Pt responded well to albuterol treatment during visit, has been using inhaler as directed used 4 times yesterday and twice this AM, but continues to have SOB and wheezing. SOB also worsened with exertion and lying flat, sitting up to breath 2 pillow orthonpnea. No fever, no chest pain, no rhinorrhea,no abd pain.      Habits & Providers  Alcohol-Tobacco-Diet     Tobacco Status: never     Passive Smoke Exposure: yes  Current Medications (verified): 1)  Bayer Childrens Aspirin 81 Mg Chew (Aspirin) .... Take 1 Tablet By Mouth Once A Day 2)  Lipitor 80 Mg Tabs (Atorvastatin Calcium) .... Take 1 Tab Daily 3)  Tricor 48 Mg Tabs (Fenofibrate) .... Take 1 Tablet By Mouth Once A Day 4)  Coreg 6.25 Mg  Tabs (Carvedilol) .Marland Kitchen.. 1 By Mouth Twice Daily 5)  Omeprazole 20 Mg  Cpdr (Omeprazole) .Marland Kitchen.. 1 By Mouth Daily 6)  Norvasc 10 Mg Tabs (Amlodipine Besylate) .Marland Kitchen.. 1 By Mouth Daily 7)  Procrit 16109 Unit/ml Soln (Epoetin Alfa) .... Per Dr. Arrie Aran With Cka 8)  Calcium 600+d 600-200 Mg-Unit Tabs (Calcium Carbonate-Vitamin D) .Marland Kitchen.. 1 By Mouth Two Times A Day 9)  Gabapentin 300 Mg Caps (Gabapentin) .... Take 1 By Mouth Two Times A Day 10)  Glipizide Xl 10  Mg Xr24h-Tab (Glipizide) .... Take One Tablet Daily 11)  Iron 325 (65 Fe) Mg Tabs (Ferrous Sulfate) .Marland Kitchen.. 1 Tablet Daily 12)  Vitamin B-12 500 Mcg Tabs (Cyanocobalamin) .... 2 Tabs Daily 13)  Fish Oil 1000 Mg Caps (Omega-3 Fatty Acids) .... 2 Tabs Daily 14)  Vicodin 5-500 Mg Tabs (Hydrocodone-Acetaminophen) .Marland Kitchen.. 1 By Mouth Three Times A Day As Needed Arthritic Pain 15)  Ventolin Hfa 108 (90 Base) Mcg/act Aers (Albuterol Sulfate) .... Take 2 Puffs Every 4 Hours As Needed For Shortness of Breath 16)  Azithromycin 500 Mg Tabs (Azithromycin) .... Take One Tab By Mouth Daily For 3 Days. 17)  Tessalon Perles 100 Mg Caps (Benzonatate) .Marland Kitchen.. 1 By Mouth Three Times A Day As Needed  Allergies (verified): No Known Drug Allergies  Past History:  Past Medical History: Last updated: 04/10/2010 Current Problems:  NEOPLASM, KIDNEY (ICD-239.5) RENAL INSUFFICIENCY, CHRONIC (ICD-585.9)-Stage III OSTEOARTHRITIS, MULTI SITES (ICD-715.98) NEUROPATHY, PERIPHERAL (ICD-356.9) HYPERTENSION, BENIGN SYSTEMIC (ICD-401.1) HYPERLIPIDEMIA (ICD-272.4) DIABETES MELLITUS II, UNCOMPLICATED (ICD-250.00) Echo July 2011- EF 60%, Grade I diastolic dysfunction  Past Surgical History: Last updated: 12/11/2008 hx cholescystectomy  Family History: Last updated: Sep 24, 2006 Father died at 33 from ? CAD., Mother died at 52 from `old age`.  Social  History: Lives in [place] with husband, [name].  Does not work. Non smoker, no alcohol, no illicit drugs  Review of Systems       Per HPI  Physical Exam  General:  NAD, sick appearing Vital signs noted, initial Ox sat 87%  Eyes:  PERRLA, EOMI, bilateral tearing.  Mouth:  slightly dry MMM, oropharynx clear, poor dentition, wears dentures Neck:  no LAD no JVD  Lungs:  Decreasd air movmeent bilat, bilat expiratory wheeze, +rhonchi Left base increased WOB , able to speak in short sentences with audible wheeze no retractions SOB worsened with exertion   Heart:  RRR  2/6  systolic murmur Abdomen:  soft, non-tender, normal bowel sounds, and no distention.   Pulses:  2+ bilateral Extremities:  no edema   Impression & Recommendations:  Problem # 1:  ACUTE BRONCHITIS (ICD-466.0)/SOB Assessment Deteriorated Progressively worse SOB in setting of treatment for CAP/bronchitis. Will admit for oxygen therapy, nebulizer treatment and start IV steroids, note no history of lung disease such as Asthma/COPD and Non smoker Repeat CXR  CBC, BMET IV azithromycin and IV rocephin x 1 , as pt has had 2 days of rocephin and 2 days of Azithromycin at this point Urine strep Her updated medication list for this problem includes:    Ventolin Hfa 108 (90 Base) Mcg/act Aers (Albuterol sulfate) .Marland Kitchen... Take 2 puffs every 4 hours as needed for shortness of breath    Azithromycin 500 Mg Tabs (Azithromycin) .Marland Kitchen... Take one tab by mouth daily for 3 days.    Tessalon Perles 100 Mg Caps (Benzonatate) .Marland Kitchen... 1 by mouth three times a day as needed  Problem # 2:  DIASTOLIC DYSFUNCTION (ICD-429.9) Assessment: Unchanged  Likley not cause of SOB, no signs of decompensated heart failure Check BNP, CXR per above, no diruiresis needed at this time  Orders: [facility]- Est Level  5 (16109)  Problem # 3:  RENAL DISEASE, CHRONIC, STAGE III (ICD-585.3) Assessment: Unchanged Will monitor renal function Avoid nephrotoxic drugs  Problem # 4:  DIABETES MELLITUS II, UNCOMPLICATED (ICD-250.00) Assessment: Unchanged  Good control, continue Glipizide, check CBG Q am Her updated medication list for this problem includes:    Bayer Childrens Aspirin 81 Mg Chew (Aspirin) .Marland Kitchen... Take 1 tablet by mouth once a day    Glipizide Xl 10 Mg Xr24h-tab (Glipizide) .Marland Kitchen... Take one tablet daily  Labs Reviewed: Creat: 1.80 (05/02/2010)   Microalbumin: 1+ (01/20/2007)  Last Eye Exam: normal (04/03/2009) Reviewed HgBA1c results: 6.0 (07/09/2010)  6.2 (03/25/2010)  Orders: [facility]- Est Level  5 (60454)  Problem # 5:   FEN/GI IVF CHeck Lytes by mouth ad lib  Problem # 6:  HYPERTENSION, BENIGN SYSTEMIC (ICD-401.1) Assessment: Unchanged Continue current meds Her updated medication list for this problem includes:    Coreg 6.25 Mg Tabs (Carvedilol) .Marland Kitchen... 1 by mouth twice daily    Norvasc 10 Mg Tabs (Amlodipine besylate) .Marland Kitchen... 1 by mouth daily  Orders: [facility]- Est Level  5 (09811)  Prophylaxis - Heparin 5000units q 8hours  Code Status-  Full Code Dispo- pending clinical resolution, PT/OT  , will plan to go home with husband at discharge  Complete Medication List: 1)  Bayer Childrens Aspirin 81 Mg Chew (Aspirin) .... Take 1 tablet by mouth once a day 2)  Lipitor 80 Mg Tabs (Atorvastatin calcium) .... Take 1 tab daily 3)  Tricor 48 Mg Tabs (Fenofibrate) .... Take 1 tablet by mouth once a day 4)  Coreg 6.25 Mg Tabs (Carvedilol) .Marland Kitchen.. 1 by mouth twice  daily 5)  Omeprazole 20 Mg Cpdr (Omeprazole) .Marland Kitchen.. 1 by mouth daily 6)  Norvasc 10 Mg Tabs (Amlodipine besylate) .Marland Kitchen.. 1 by mouth daily 7)  Procrit 16109 Unit/ml Soln (Epoetin alfa) .... Per dr. Arrie Aran with cka 8)  Calcium 600+d 600-200 Mg-unit Tabs (Calcium carbonate-vitamin d) .Marland Kitchen.. 1 by mouth two times a day 9)  Gabapentin 300 Mg Caps (Gabapentin) .... Take 1 by mouth two times a day 10)  Glipizide Xl 10 Mg Xr24h-tab (Glipizide) .... Take one tablet daily 11)  Iron 325 (65 Fe) Mg Tabs (Ferrous sulfate) .Marland Kitchen.. 1 tablet daily 12)  Vitamin B-12 500 Mcg Tabs (Cyanocobalamin) .... 2 tabs daily 13)  Fish Oil 1000 Mg Caps (Omega-3 fatty acids) .... 2 tabs daily 14)  Vicodin 5-500 Mg Tabs (Hydrocodone-acetaminophen) .Marland Kitchen.. 1 by mouth three times a day as needed arthritic pain 15)  Ventolin Hfa 108 (90 Base) Mcg/act Aers (Albuterol sulfate) .... Take 2 puffs every 4 hours as needed for shortness of breath 16)  Azithromycin 500 Mg Tabs (Azithromycin) .... Take one tab by mouth daily for 3 days. 17)  Tessalon Perles 100 Mg Caps (Benzonatate) .Marland Kitchen.. 1 by mouth three times a  day as needed   Orders Added: 1)  [facility]- Est Level  5 [60454]  Appended Document: f/u per [place]/eo I saw and interviewed Ms Steptoe with Dr Jeanice Lim and agree with her H&P and plans.    It appears that she has a bronchitis with bronchospasm and resultant hypoxia however we should check a CXR and BNP to help detect any CHF component.   Appended Document: f/u per [place]/eo   Medication Administration  Medication # 1:    Medication: Albuterol Sulfate Sol 1mg  unit dose    Diagnosis: ACUTE BRONCHITIS (ICD-466.0)    Dose: 2.5mg /8ml    Route: inhaled    Exp Date: 12/29/2011    Lot #: [id number]    Mfr: nephron    Patient tolerated medication without complications    Given by: Arlyss Repress CMA, (July 18, 2010 9:32 AM)  Medication # 2:    Medication: Atrovent 1mg  (Neb)    Diagnosis: ACUTE BRONCHITIS (ICD-466.0)    Dose: 0.5mg     Route: inhaled    Exp Date: 12/29/2011    Lot #: [id number]    Mfr: nephron    Patient tolerated medication without complications    Given by: Arlyss Repress CMA, (July 18, 2010 9:33 AM)  Orders Added: 1)  Albuterol Sulfate Sol 1mg  unit dose [J7613] 2)  Atrovent 1mg  (Neb) [F6213]

## 2010-08-01 NOTE — Progress Notes (Signed)
Summary: Triage  Phone Note Call from Patient Call back at Home Phone [phone]   Reason for Call: Talk to Nurse Summary of Call: has drymouth, wants to know if its caused by one of her meds? Initial call taken by: Knox Royalty,  January 10, 2010 1:49 PM  Follow-up for Phone Call        told her it could be. advised sipping on water frequently or suck on hard candy. this can also be a function of age as well. she will drink more Follow-up by: Golden Circle RN,  January 10, 2010 1:51 PM

## 2010-08-01 NOTE — Progress Notes (Signed)
Summary: Rx Prob  Phone Note Refill Request Call back at Home Phone [phone] Message from:  Patient  Refills Requested: Medication #1:  NORVASC 10 MG TABS 1 by mouth daily  Medication #2:  GLIPIZIDE XL 10 MG XR24H-TAB take one tablet daily PT ONLY NEEDS 3 OF EACH TILL HER COMES IN THE MAIL.  WANTS TO TAKE HER HUSBAND'S BECAUSE HE IS ON THE SAME THING.  CAN SHE DO THIS?  Initial call taken by: Clydell Hakim,  September 17, 2009 9:48 AM  Follow-up for Phone Call        called patient but she states she has found two pills of each and she is ok with amount now until her Rx comes in mail.  Follow-up by: Theresia Lo RN,  September 17, 2009 10:51 AM

## 2010-08-01 NOTE — Assessment & Plan Note (Signed)
Summary: [name] 2 days ago and LE edema/kf   Vital Signs:  Patient profile:   77 year old female Height:      67.5 inches Weight:      177 pounds BMI:     27.41 Temp:     97.7 degrees F oral Pulse rate:   81 / minute BP sitting:   116 / 81  (left arm) Cuff size:   regular  Vitals Entered By: Tessie Fass CMA (May 02, 2010 11:30 AM) CC: fell monday morning Is Patient Diabetic? Yes Pain Assessment Patient in pain? no        Primary Care Provider:  [name] MD  CC:  fell monday morning.  History of Present Illness: Patient fell when pushing her shopping cart ove the curb, she made a bad call per her.  She did not become light headed.  She hit her head on the ground, she did not loose consciousness.  She got up and went home.  She has no complications other than local swelling and brusing.  Her legs are weak and she doctors for osteoarthritis.  She has chronic low back pain for which she uses hydrocodone, she denies that the narcotic causes her to be sleepy or weaker.  She spends a lot of time sitting around.  Current Medications (verified): 1)  Bayer Childrens Aspirin 81 Mg Chew (Aspirin) .... Take 1 Tablet By Mouth Once A Day 2)  Lipitor 80 Mg Tabs (Atorvastatin Calcium) .... Take 1 Tab Daily 3)  Tricor 48 Mg Tabs (Fenofibrate) .... Take 1 Tablet By Mouth Once A Day 4)  Coreg 6.25 Mg  Tabs (Carvedilol) .Marland Kitchen.. 1 By Mouth Twice Daily 5)  Omeprazole 20 Mg  Cpdr (Omeprazole) .Marland Kitchen.. 1 By Mouth Daily 6)  Norvasc 10 Mg Tabs (Amlodipine Besylate) .Marland Kitchen.. 1 By Mouth Daily 7)  Procrit 16109 Unit/ml Soln (Epoetin Alfa) .... Per Dr. Arrie Aran With Cka 8)  Calcium 600+d 600-200 Mg-Unit Tabs (Calcium Carbonate-Vitamin D) .Marland Kitchen.. 1 By Mouth Two Times A Day 9)  Gabapentin 300 Mg Caps (Gabapentin) .... Take 1 By Mouth At Bedtime 10)  Glipizide Xl 10 Mg Xr24h-Tab (Glipizide) .... Take One Tablet Daily 11)  Iron 325 (65 Fe) Mg Tabs (Ferrous Sulfate) .Marland Kitchen.. 1 Tablet Daily 12)  Vitamin B-12 500  Mcg Tabs (Cyanocobalamin) .... 2 Tabs Daily 13)  Fish Oil 1000 Mg Caps (Omega-3 Fatty Acids) .... 2 Tabs Daily 14)  Vicodin 5-500 Mg Tabs (Hydrocodone-Acetaminophen) .Marland Kitchen.. 1 By Mouth Three Times A Day As Needed Arthritic Pain  Allergies (verified): No Known Drug Allergies  Review of Systems General:  Denies malaise and weakness. CV:  Denies chest pain or discomfort. MS:  Complains of joint pain.  Physical Exam  General:  Ususal appearing, with a bruse on her right forhead and periorbital brusing. Msk:  unable to go from sit to stand without using her UEm gait is wide based and non-antalgic  Feet-good supportive shoes, intact sensation to position and touch.   Impression & Recommendations:  Problem # 1:  EDEMA (ICD-782.3) mild, salt restriction, will not add dieuretic becaue of close monitory by renal for stage III CKD. Orders: Basic [facility] (60454-09811) [facility]- Est  Level 4 (91478)  Problem # 2:  ABNORMALITY OF GAIT (ICD-781.2) Physical Therapy, to begin stregthening exerecises and fit for cane Orders: [facility]- Est  Level 4 (99214)  Problem # 3:  LOW BACK PAIN, CHRONIC (ICD-724.2)  hydrocodone was faxed to [facility] for #90 no refills Her updated medication  list for this problem includes:    Bayer Childrens Aspirin 81 Mg Chew (Aspirin) .Marland Kitchen... Take 1 tablet by mouth once a day    Vicodin 5-500 Mg Tabs (Hydrocodone-acetaminophen) .Marland Kitchen... 1 by mouth three times a day as needed arthritic pain  Orders: [facility]- Est  Level 4 (40981)  Complete Medication List: 1)  Bayer Childrens Aspirin 81 Mg Chew (Aspirin) .... Take 1 tablet by mouth once a day 2)  Lipitor 80 Mg Tabs (Atorvastatin calcium) .... Take 1 tab daily 3)  Tricor 48 Mg Tabs (Fenofibrate) .... Take 1 tablet by mouth once a day 4)  Coreg 6.25 Mg Tabs (Carvedilol) .Marland Kitchen.. 1 by mouth twice daily 5)  Omeprazole 20 Mg Cpdr (Omeprazole) .Marland Kitchen.. 1 by mouth daily 6)  Norvasc 10 Mg Tabs (Amlodipine besylate) .Marland Kitchen.. 1 by mouth  daily 7)  Procrit 19147 Unit/ml Soln (Epoetin alfa) .... Per dr. Arrie Aran with cka 8)  Calcium 600+d 600-200 Mg-unit Tabs (Calcium carbonate-vitamin d) .Marland Kitchen.. 1 by mouth two times a day 9)  Gabapentin 300 Mg Caps (Gabapentin) .... Take 1 by mouth at bedtime 10)  Glipizide Xl 10 Mg Xr24h-tab (Glipizide) .... Take one tablet daily 11)  Iron 325 (65 Fe) Mg Tabs (Ferrous sulfate) .Marland Kitchen.. 1 tablet daily 12)  Vitamin B-12 500 Mcg Tabs (Cyanocobalamin) .... 2 tabs daily 13)  Fish Oil 1000 Mg Caps (Omega-3 fatty acids) .... 2 tabs daily 14)  Vicodin 5-500 Mg Tabs (Hydrocodone-acetaminophen) .Marland Kitchen.. 1 by mouth three times a day as needed arthritic pain  Patient Instructions: 1)  Please schedule a follow-up appointment in 1 month.  Prescriptions: VICODIN 5-500 MG TABS (HYDROCODONE-ACETAMINOPHEN) 1 by mouth three times a day as needed arthritic pain  #90 x -   Entered and Authorized by:   Luretha Murphy NP   Signed by:   Luretha Murphy NP on 05/02/2010   Method used:   Print then Give to Patient   RxID:   [id number] VICODIN 5-500 MG TABS (HYDROCODONE-ACETAMINOPHEN) 1 by mouth three times a day as needed arthritic pain Brand medically necessary #60 x 0   Entered and Authorized by:   Luretha Murphy NP   Signed by:   Luretha Murphy NP on 05/02/2010   Method used:   Print then Give to Patient   RxID:   [id number]    Orders Added: 1)  Basic [facility] [[id number]] 2)  [facility]- Est  Level 4 [34742]

## 2010-08-01 NOTE — Progress Notes (Signed)
Summary: phn msg  Phone Note Call from Patient Call back at Home Phone [phone]   Caller: Patient Summary of Call: losing weight even when she eats & losing hair - wants to know if she needs a thyroid test Initial call taken by: De Nurse,  August 14, 2009 11:37 AM  Follow-up for Phone Call        see above complaints. wants thyroid checked. appt with pcp friday at 3:30 Follow-up by: Golden Circle RN,  August 14, 2009 12:02 PM

## 2010-08-01 NOTE — Progress Notes (Signed)
Summary: Rx Req  Phone Note Call from Patient Call back at Home Phone [phone]   Caller: Patient Summary of Call: pt calling to say that the cough medicine that she was given today is not helping.  Pt uses [facility] [address] Initial call taken by: Clydell Hakim,  July 02, 2009 10:57 AM  Follow-up for Phone Call        will forward message to MD. Follow-up by: Theresia Lo RN,  July 02, 2009 11:45 AM  Additional Follow-up for Phone Call Additional follow up Details #1::        I do not have any other cough suppressant. She has tried all of them. She can try cough drops or cholorseptic spray. The cough is not dangerous. Additional Follow-up by: [name] MD,  July 02, 2009 5:24 PM    Additional Follow-up for Phone Call Additional follow up Details #2::    gave her the above info. she will try cough drops Follow-up by: Golden Circle RN,  July 03, 2009 12:15 PM

## 2010-08-01 NOTE — Progress Notes (Signed)
Summary: Missed appt- needs to reschedule  Phone Note Outgoing Call   Call placed by: [name] MD,  June 05, 2010 8:59 AM Details for Reason: Missed appt Summary of Call: Spoke with pt her and her husband did not have transportation, will need to reschedule. Told pt to call back to reschedule

## 2010-08-01 NOTE — Assessment & Plan Note (Signed)
Summary: f/up,tcb   Vital Signs:  Patient profile:   77 year old female Height:      67.5 inches Weight:      166 pounds BMI:     25.71 Pulse rate:   80 / minute BP sitting:   137 / 73  (right arm)  Vitals Entered By: Arlyss Repress CMA, (July 09, 2009 9:06 AM) CC: f/up DM. HTN. Is Patient Diabetic? Yes Pain Assessment Patient in pain? no        Primary Care Provider:  [name] MD  CC:  f/up DM. HTN.Marland Kitchen  History of Present Illness: 1. HTN- takes all meds as prescribed, No chest pain, SOB, Tolerating meds, does not take BP at home  2. DM- does not take BS, takes Glipizide without difficulty.No polyuria, no hypoglycemic episodes. Occasional tingling in feet, neurontin helps but she does not take it daily only as needed   3. CKI- follow with Dr. Geanie Berlin Stage 3CKD today,  4. CAP- Resolved , denies fever, SOB, admits to occasional cough  but very much improved. Thinks the Ceftriaxone helped the best   Reviwed lab work from October   ROS- neg, except pt thought she felt a lump beneath her left breast.      Habits & Providers  Alcohol-Tobacco-Diet     Tobacco Status: never  Current Medications (verified): 1)  Bayer Childrens Aspirin 81 Mg Chew (Aspirin) .... Take 1 Tablet By Mouth Once A Day 2)  Lipitor 80 Mg Tabs (Atorvastatin Calcium) .... Take 1 Tab Daily 3)  Tricor 48 Mg Tabs (Fenofibrate) .... Take 1 Tablet By Mouth Once A Day 4)  Tylenol Arthritis Pain 650 Mg  Tbcr (Acetaminophen) .Marland Kitchen.. 1 By Mouth Every 6 Hours As Needed For Pain 5)  Coreg 6.25 Mg  Tabs (Carvedilol) .Marland Kitchen.. 1 By Mouth Twice Daily 6)  Omeprazole 20 Mg  Cpdr (Omeprazole) .Marland Kitchen.. 1 By Mouth Daily 7)  Norvasc 10 Mg Tabs (Amlodipine Besylate) .Marland Kitchen.. 1 By Mouth Daily 8)  Procrit 16109 Unit/ml Soln (Epoetin Alfa) .... Per Dr. Arrie Aran With Cka 9)  Calcium 600+d 600-200 Mg-Unit Tabs (Calcium Carbonate-Vitamin D) .Marland Kitchen.. 1 By Mouth Two Times A Day 10)  Gabapentin 300 Mg Caps (Gabapentin) .... Take 1 By  Mouth At Bedtime 11)  Glipizide Xl 10 Mg Xr24h-Tab (Glipizide) .... Take One Tablet Daily 12)  Iron 325 (65 Fe) Mg Tabs (Ferrous Sulfate) .Marland Kitchen.. 1 Tablet Daily 13)  Vitamin B-12 500 Mcg Tabs (Cyanocobalamin) .... 2 Tabs Daily 14)  Fish Oil 1000 Mg Caps (Omega-3 Fatty Acids) .... 2 Tabs Daily 15)  Vicodin 5-500 Mg Tabs (Hydrocodone-Acetaminophen) .Marland Kitchen.. 1 By Mouth As Needed Pain  Allergies (verified): No Known Drug Allergies  Review of Systems       per HPI  Physical Exam  General:  Well-developed,well-nourished,in no acute distress; alert,appropriate and cooperative throughout examination, Vital signs noted  Eyes:   EOMI. Perrla. Mouth:  MMM Neck:  No carotid bruit  Breasts:  No mass, nodules, thickening, tenderness, bulging, retraction, inflamation, nipple discharge or skin changes noted.   Lungs:  Bilat llower lobe crackles, no rhonchi, no wheeze, no use of accesory muscles, normal WOB Heart:  Normal rate and regular rhythm. S1 and S2 normal without gallop, murmur, click, rub or other extra sounds. Pulses:  pulses equal bilat Extremities:  Normal monofilament testing on both feet No callus formation, no ulceration, dry peeling skin between toes ? warty like lesion on 3rd toe on right foot   Impression & Recommendations:  Problem # 1:  HYPERTENSION, BENIGN SYSTEMIC (ICD-401.1) Assessment Unchanged  Continue current regimine, pt also followed by [facility], pt will be seen today for labs at there office Her updated medication list for this problem includes:    Coreg 6.25 Mg Tabs (Carvedilol) .Marland Kitchen... 1 by mouth twice daily    Norvasc 10 Mg Tabs (Amlodipine besylate) .Marland Kitchen... 1 by mouth daily  Orders: [facility]- Est  Level 4 (91478)  Problem # 2:  DIABETES MELLITUS II, UNCOMPLICATED (ICD-250.00) Assessment: Unchanged  Her updated medication list for this problem includes:    Bayer Childrens Aspirin 81 Mg Chew (Aspirin) .Marland Kitchen... Take 1 tablet by mouth once a day    Glipizide Xl 10 Mg  Xr24h-tab (Glipizide) .Marland Kitchen... Take one tablet daily  Orders: A1C-[facility] (29562) [facility]- Est  Level 4 (13086)  Labs Reviewed: Creat: 1.92 (04/04/2009)   Microalbumin: 1+ (01/20/2007)  Last Eye Exam: normal (04/03/2009) Reviewed HgBA1c results: 6.1 (07/09/2009)  6.5 (04/04/2009)  Problem # 3:  NEUROPATHY, PERIPHERAL (ICD-356.9) Assessment: Deteriorated  Will restart neurontin at 300mg QHS, pt  instructed to take daily, if needed will increase dose  Orders: [facility]- Est  Level 4 (57846)  Problem # 4:  PNEUMONIA, ORGANISM UNSPECIFIED (ICD-486) Assessment: New  Resolved The following medications were removed from the medication list:r    Azithromycin 500 Mg Tabs (Azithromycin) .Marland Kitchen... Take one pill daily for 5 days  Orders: [facility]- Est  Level 4 (96295)  Complete Medication List: 1)  Bayer Childrens Aspirin 81 Mg Chew (Aspirin) .... Take 1 tablet by mouth once a day 2)  Lipitor 80 Mg Tabs (Atorvastatin calcium) .... Take 1 tab daily 3)  Tricor 48 Mg Tabs (Fenofibrate) .... Take 1 tablet by mouth once a day 4)  Tylenol Arthritis Pain 650 Mg Tbcr (Acetaminophen) .Marland Kitchen.. 1 by mouth every 6 hours as needed for pain 5)  Coreg 6.25 Mg Tabs (Carvedilol) .Marland Kitchen.. 1 by mouth twice daily 6)  Omeprazole 20 Mg Cpdr (Omeprazole) .Marland Kitchen.. 1 by mouth daily 7)  Norvasc 10 Mg Tabs (Amlodipine besylate) .Marland Kitchen.. 1 by mouth daily 8)  Procrit 28413 Unit/ml Soln (Epoetin alfa) .... Per dr. Arrie Aran with cka 9)  Calcium 600+d 600-200 Mg-unit Tabs (Calcium carbonate-vitamin d) .Marland Kitchen.. 1 by mouth two times a day 10)  Gabapentin 300 Mg Caps (Gabapentin) .... Take 1 by mouth at bedtime 11)  Glipizide Xl 10 Mg Xr24h-tab (Glipizide) .... Take one tablet daily 12)  Iron 325 (65 Fe) Mg Tabs (Ferrous sulfate) .Marland Kitchen.. 1 tablet daily 13)  Vitamin B-12 500 Mcg Tabs (Cyanocobalamin) .... 2 tabs daily 14)  Fish Oil 1000 Mg Caps (Omega-3 fatty acids) .... 2 tabs daily 15)  Vicodin 5-500 Mg Tabs (Hydrocodone-acetaminophen) .Marland Kitchen.. 1 by mouth as needed  pain  Patient Instructions: 1)  Try to take your blood sugars and bring to next visit 2)  Your A1C was 6.1% 3)  Your LDL is 90 4)  Continue your medications 5)  Restart the neurotin 300mg  at night time 6)  Return in 3 months   Laboratory Results   Blood Tests   Date/Time Received: July 09, 2009 9:00 AM  Date/Time Reported: July 09, 2009 9:20 AM    HGBA1C: 6.1%   (Normal Range: Non-Diabetic - 3-6%   Control Diabetic - 6-8%)  Comments: ...........test performed by...........Marland KitchenTerese Door, CMA       Prevention & Chronic Care Immunizations   Influenza vaccine: Fluvax MCR  (03/16/2008)   Influenza vaccine due: 03/16/2009    Tetanus booster: 06/30/1998: Done.  Tetanus booster due: 06/30/2008    Pneumococcal vaccine: Done.  (06/30/1997)   Pneumococcal vaccine due: None    H. zoster vaccine: 11/30/2007: Zostavax  Colorectal Screening   Hemoccult: Done.  (09/28/2004)   Hemoccult due: Not Indicated    Colonoscopy: Not documented   Colonoscopy due: Refused  (09/05/2008)  Other Screening   Pap smear: Done.  (08/01/2003)   Pap smear due: Not Indicated    Mammogram: normal  (04/13/2008)   Mammogram due: 04/13/2009    DXA bone density scan: Done.  (11/28/2001)   DXA scan due: None    Smoking status: never  (07/09/2009)  Diabetes Mellitus   HgbA1C: 6.1  (07/09/2009)   Hemoglobin A1C due: 06/01/2008    Eye exam: normal  (04/03/2009)   Eye exam due: 03/2010    Foot exam: yes  (04/04/2009)   High risk foot: Not documented   Foot care education: Not documented   Foot exam due: 10/11/2009    Urine microalbumin/creatinine ratio: Not documented   Urine microalbumin/cr due: 01/20/2008    Diabetes flowsheet reviewed?: Yes   Progress toward A1C goal: At goal  Lipids   Total Cholesterol: 186  (09/14/2008)   LDL: 94  (09/14/2008)   LDL Direct: 90  (04/04/2009)   HDL: 53  (09/14/2008)   Triglycerides: 193  (09/14/2008)    SGOT (AST): 14  (04/04/2009)    SGPT (ALT): 10  (04/04/2009)   Alkaline phosphatase: 40  (04/04/2009)   Total bilirubin: 0.7  (04/04/2009)  Hypertension   Last Blood Pressure: 137 / 73  (07/09/2009)   Serum creatinine: 1.92  (04/04/2009)   Serum potassium 4.1  (04/04/2009)    Hypertension flowsheet reviewed?: Yes   Progress toward BP goal: At goal  Self-Management Support :   Personal Goals (by the next clinic visit) :     Personal A1C goal: 7  (04/04/2009)     Personal blood pressure goal: 130/80  (04/04/2009)     Personal LDL goal: 100  (04/04/2009)    Diabetes self-management support: Not documented    Hypertension self-management support: Not documented    Lipid self-management support: Not documented

## 2010-08-02 ENCOUNTER — Encounter: Payer: Self-pay | Admitting: Family Medicine

## 2010-08-05 ENCOUNTER — Encounter (HOSPITAL_COMMUNITY): Payer: PRIVATE HEALTH INSURANCE | Attending: Nephrology

## 2010-08-05 DIAGNOSIS — D509 Iron deficiency anemia, unspecified: Secondary | ICD-10-CM | POA: Insufficient documentation

## 2010-08-07 NOTE — Consult Note (Signed)
Summary:  [facility]   Imported By: De Nurse 08/02/2010 12:39:47  _____________________________________________________________________  External Attachment:    Type:   Image     Comment:   External Document

## 2010-08-09 ENCOUNTER — Ambulatory Visit (INDEPENDENT_AMBULATORY_CARE_PROVIDER_SITE_OTHER): Payer: PRIVATE HEALTH INSURANCE | Admitting: Family Medicine

## 2010-08-09 ENCOUNTER — Encounter: Payer: Self-pay | Admitting: Family Medicine

## 2010-08-09 DIAGNOSIS — N183 Chronic kidney disease, stage 3 unspecified: Secondary | ICD-10-CM

## 2010-08-09 DIAGNOSIS — J45909 Unspecified asthma, uncomplicated: Secondary | ICD-10-CM

## 2010-08-09 DIAGNOSIS — R21 Rash and other nonspecific skin eruption: Secondary | ICD-10-CM

## 2010-08-09 DIAGNOSIS — J209 Acute bronchitis, unspecified: Secondary | ICD-10-CM

## 2010-08-09 DIAGNOSIS — I10 Essential (primary) hypertension: Secondary | ICD-10-CM

## 2010-08-09 LAB — CONVERTED CEMR LAB
AST: 16 units/L (ref 0–37)
Albumin: 4.4 g/dL (ref 3.5–5.2)
Alkaline Phosphatase: 38 units/L — ABNORMAL LOW (ref 39–117)
BUN: 38 mg/dL — ABNORMAL HIGH (ref 6–23)
Creatinine, Ser: 1.69 mg/dL — ABNORMAL HIGH (ref 0.40–1.20)
Potassium: 4.2 meq/L (ref 3.5–5.3)
Total Bilirubin: 0.6 mg/dL (ref 0.3–1.2)

## 2010-08-09 MED ORDER — ALBUTEROL SULFATE HFA 108 (90 BASE) MCG/ACT IN AERS: 2.0000 | INHALATION_SPRAY | RESPIRATORY_TRACT | Status: DC | PRN

## 2010-08-09 NOTE — Progress Notes (Signed)
  Subjective:    Patient ID: [name], female    DOB: 07-21-1927, 77 y.o.   MRN: [id number]  HPI Pt admitted a few weeks ago, for acute bronchitis, concern for PNA however x-ray did not reveal any infiltrate, treated with prednisone course Status post antibiotics and nebulizer treatments. At discharge pt did not require any supplemental oxygen. Feels well, no cough,no CP, no SOB, no fever, not using inhaler any more.  Pt did notice a small red spot behind her right ear a few days ago, using neosporin which helps, mild itch to area, no new perfume or soap Concerned her liver has not been checked, taking high dose Statin  Noted urology and recent Nephrology notes- no new changes to be made    Review of Systems per above     Objective:   Physical Exam GEN-  NAD, pleasant, vitals noted     HEENT- MMM, mild erythenatous areas at base of pinna-post right ear, dry skin, no pearly appearance, texture smooth like surround neck/skin tissue Non tender, no palpable nodes    CVS- RRR, 2/6 SEM    Resp- CTAB        Assessment & Plan:

## 2010-08-09 NOTE — Assessment & Plan Note (Signed)
Resolved, there was some question of RAD on discharge summary, note refilled inhaler(albuterol), pt was not able to get the albuterol prescribed w/spacer from the hospital secondary to cost. Noted passive tobacco exposure,if she continues to have difficulty with breathing,send for PFT

## 2010-08-09 NOTE — Patient Instructions (Addendum)
Return in April to have your cholesterol checked, do not eat after midnight Please call and schedule the lab appt before coming in Next visit with me in June, schedule with your husband  Keep the area behind your ear clean, you can use the antibiotic ointment daily for the next week If this does not improve come back to be seen

## 2010-08-09 NOTE — Assessment & Plan Note (Signed)
No specific rash noted, does not appear to be BCC, continue prn neosporin f/u if area worsens

## 2010-08-09 NOTE — Assessment & Plan Note (Signed)
Improved back to baseline s/p acute illness Check CMET for LFT  Baseline Cr 1.8

## 2010-08-10 LAB — COMPREHENSIVE METABOLIC PANEL
Albumin: 4.4 g/dL (ref 3.5–5.2)
Alkaline Phosphatase: 38 U/L — ABNORMAL LOW (ref 39–117)
Glucose, Bld: 111 mg/dL — ABNORMAL HIGH (ref 70–99)
Potassium: 4.2 mEq/L (ref 3.5–5.3)
Sodium: 139 mEq/L (ref 135–145)
Total Protein: 7 g/dL (ref 6.0–8.3)

## 2010-08-12 ENCOUNTER — Ambulatory Visit: Payer: PRIVATE HEALTH INSURANCE | Attending: Family Medicine | Admitting: Physical Therapy

## 2010-08-12 ENCOUNTER — Telehealth: Payer: Self-pay | Admitting: Family Medicine

## 2010-08-12 DIAGNOSIS — M545 Low back pain, unspecified: Secondary | ICD-10-CM | POA: Insufficient documentation

## 2010-08-12 DIAGNOSIS — IMO0001 Reserved for inherently not codable concepts without codable children: Secondary | ICD-10-CM | POA: Insufficient documentation

## 2010-08-12 NOTE — Telephone Encounter (Signed)
See above in comments

## 2010-08-14 NOTE — H&P (Signed)
[name], [name] NO.:  [id number]  MEDICAL RECORD NO.:  [id number]          PATIENT TYPE:  INP  LOCATION:  5531                         FACILITY:  [facility]  PHYSICIAN:  [name], EdwardsDATE OF BIRTH:  02-Apr-1928  DATE OF ADMISSION:  07/18/2010 DATE OF DISCHARGE:                             HISTORY & PHYSICAL   PRIMARY CARE PROVIDER:  [name], [name].  PRIMARY NEPHROLOGIST:  [name], M.D.  CHIEF COMPLAINT:  Followup of bronchitis.  HISTORY OF PRESENT ILLNESS:  The patient was seen yesterday for shortness of breath and productive cough, concern for probable community- acquired pneumonia, status post 2-day use of azithromycin and 2 days of IM Rocephin and Ventolin.  The patient responded well to albuterol treatment during visit and has been using inhaler as directed.  Used four times yesterday and twice this a.m., but continues to have shortness of breath and wheezing on recheck today.  Shortness of breath is also worsened with exertion and lying flat.  The patient is found sitting up to breathe, with two-pillow orthopnea.  Denies any fever, chest pain, rhinorrhea, abdominal pain.  Positive fatigue and weakness. Denies body aches.  Cough is productive of yellow sputum.  REVIEW OF SYSTEMS:  Negative except otherwise noted above.  ALLERGIES:  No known drug allergies.  MEDICATIONS: 1. Aspirin 81 mg p.o. daily. 2. Lipitor 80 mg daily. 3. TriCor 48 mg daily. 4. Coreg 6.25 mg p.o. b.i.d. 5. Omeprazole 20 mg daily. 6. Norvasc 10 mg daily. 7. Procrit injections per [facility]. 8. Calcium and vitamin D one p.o. b.i.d. 600/200 mg. 9. Gabapentin 300 mg one p.o. b.i.d. 10.Glipizide 10 mg XL daily. 11.Iron sulfate 325 mg daily. 12.Vitamin B12, 50 mcg 2 tablets daily. 13.Fish oil 1000 mg 2 tablets daily. 14.Vicodin 5/500 one p.o. t.i.d. p.r.n. pain. 15.Albuterol 2 puffs q.4 h. p.r.n. inhaler. 16.Azithromycin 500 mg daily. 17.Tessalon  Perles 100 mg p.o. t.i.d. p.r.n. cough.  PAST MEDICAL HISTORY: 1. Chronic renal insufficiency stage III. 2. Osteoarthritis, multiple sites. 3. Peripheral neuropathy. 4. Hypertension. 5. Diabetes mellitus. 6. Hyperlipidemia. 7. Kidney neoplasm. 8. Grade 1 diastolic dysfunction.  PAST SURGICAL HISTORY:  History of cholecystectomy.  FAMILY HISTORY:  Noncontributory.  Father deceased at age 64 from presumptive coronary artery disease.  SOCIAL HISTORY:  The patient is married.  She does not work at this time.  She is a nonsmoker.  No alcohol, no illicit drug use.  PHYSICAL EXAMINATION:  VITAL SIGNS:  Initial O2 sat 87% on room air, this increased to 90% on room air.  Temperature 98 degrees Fahrenheit, heart rate 90, and blood pressure 121/70. GENERAL:  No acute distress, but is sick appearing.  Vital signs noted. HEENT:  PERRL, EOMI.  Bilateral tearing.  Slightly dry mucous membranes. Oropharynx clear.  Poor dentition, wears dentures.  NECK:  No lymphadenopathy.  No JVD. LUNGS:  Decreased air movement bilaterally with bilateral expiratory wheeze.  Positive rhonchi, left base.  Increased work of breathing. Able to speak in short sentences with audible wheeze.  No retraction. Noted shortness of breath, worsened with exertion. CARDIOVASCULAR:  Regular rate and rhythm, 2/6 systolic murmur.  ABDOMEN:  Soft, nontender, nondistended.  Normoactive bowel sounds. EXTREMITIES:  No edema.  Pulses 2+.  ASSESSMENT/PLAN:  An 77 year old female admitted from her primary care provider for shortness of breath and bronchitis, failed outpatient treatment. 1. Acute bronchitis, progressively worse shortness of breath, and has     had treatment for acute bronchitis vs  CAP as an outpatient setting.     We will admit for O2 therapy, nebulizer treatment.  Start IV     steroids.  Of note, no history of lung disease such as asthma or     chronic obstructive pulmonary disease.  The patient is a nonsmoker,      does not appear to be decompensated heart failure.  A repeat chest     x-ray, CBC, B-MET.  Continue IV azithromycin, IV Rocephin x1 day as     the patient has had 2 days of Rocephin already. 2. Diastolic dysfunction, likely not causing shortness of breath.  No     signs of decompensated heart failure.  Check BNP and chest x-ray     per above.  No diuresis needed at this time. 3. Stage III renal disease, at baseline. 4. Diabetes mellitus, most recent A1c in January 2012 was 6.0.  The     patient is well controlled on glipizide.  We will continue CBGs q.     a.m. 5. FEN-GI.  Minimal IV fluids as the patient is not taking a lot of     intake at this time.  We will monitor.  He has a history of     diastolic dysfunction.  Check lytes, p.o. ad lib. 6. Hypertension, stable control on Coreg and Norvasc. 7. Prophylaxis.  Heparin 5000 units q.8 h. 8. Code status, full code. 9. Disposition, pending clinical resolution, PT/OT.  We will plan to     go home with husband at discharge.     Nancy Antis, [name]   ______________________________ [name], M.D.    KD/MEDQ  D:  07/18/2010  T:  07/19/2010  Job:  [id number]  Electronically Signed by [name] on 08/02/2010 09:05:02 AM Electronically Signed by [name] M.D. on 08/14/2010 04:56:58 PM

## 2010-08-15 ENCOUNTER — Ambulatory Visit: Payer: PRIVATE HEALTH INSURANCE | Admitting: Physical Therapy

## 2010-08-15 NOTE — Consult Note (Signed)
[facility]: [facility]   Imported By: De Nurse 08/06/2010 10:57:07  _____________________________________________________________________  External Attachment:    Type:   Image     Comment:   External Document

## 2010-08-16 ENCOUNTER — Other Ambulatory Visit: Payer: Self-pay | Admitting: Family Medicine

## 2010-08-16 DIAGNOSIS — J45909 Unspecified asthma, uncomplicated: Secondary | ICD-10-CM

## 2010-08-16 DIAGNOSIS — J40 Bronchitis, not specified as acute or chronic: Secondary | ICD-10-CM

## 2010-08-16 MED ORDER — ALBUTEROL SULFATE HFA 108 (90 BASE) MCG/ACT IN AERS: 2.0000 | INHALATION_SPRAY | RESPIRATORY_TRACT | Status: DC | PRN

## 2010-08-19 ENCOUNTER — Ambulatory Visit: Payer: PRIVATE HEALTH INSURANCE | Admitting: Physical Therapy

## 2010-08-21 ENCOUNTER — Ambulatory Visit: Payer: PRIVATE HEALTH INSURANCE | Admitting: Physical Therapy

## 2010-08-22 ENCOUNTER — Encounter: Payer: PRIVATE HEALTH INSURANCE | Admitting: Physical Therapy

## 2010-08-26 ENCOUNTER — Encounter: Payer: PRIVATE HEALTH INSURANCE | Admitting: Physical Therapy

## 2010-08-27 ENCOUNTER — Ambulatory Visit: Payer: PRIVATE HEALTH INSURANCE | Admitting: Physical Therapy

## 2010-08-28 ENCOUNTER — Encounter: Payer: PRIVATE HEALTH INSURANCE | Admitting: Physical Therapy

## 2010-08-29 ENCOUNTER — Ambulatory Visit: Payer: PRIVATE HEALTH INSURANCE | Attending: Family Medicine | Admitting: Physical Therapy

## 2010-08-29 DIAGNOSIS — IMO0001 Reserved for inherently not codable concepts without codable children: Secondary | ICD-10-CM | POA: Insufficient documentation

## 2010-08-29 DIAGNOSIS — M545 Low back pain, unspecified: Secondary | ICD-10-CM | POA: Insufficient documentation

## 2010-09-02 ENCOUNTER — Ambulatory Visit: Payer: PRIVATE HEALTH INSURANCE | Admitting: Physical Therapy

## 2010-09-04 ENCOUNTER — Encounter: Payer: PRIVATE HEALTH INSURANCE | Admitting: Physical Therapy

## 2010-09-10 ENCOUNTER — Ambulatory Visit: Payer: PRIVATE HEALTH INSURANCE | Admitting: Physical Therapy

## 2010-09-12 ENCOUNTER — Ambulatory Visit: Payer: PRIVATE HEALTH INSURANCE | Admitting: Physical Therapy

## 2010-09-17 ENCOUNTER — Ambulatory Visit: Payer: PRIVATE HEALTH INSURANCE | Admitting: Physical Therapy

## 2010-09-19 ENCOUNTER — Encounter: Payer: PRIVATE HEALTH INSURANCE | Admitting: Physical Therapy

## 2010-09-24 ENCOUNTER — Other Ambulatory Visit: Payer: PRIVATE HEALTH INSURANCE

## 2010-09-26 ENCOUNTER — Ambulatory Visit: Payer: PRIVATE HEALTH INSURANCE | Admitting: Physical Therapy

## 2010-09-30 ENCOUNTER — Other Ambulatory Visit: Payer: Self-pay | Admitting: Family Medicine

## 2010-09-30 ENCOUNTER — Other Ambulatory Visit: Payer: PRIVATE HEALTH INSURANCE

## 2010-09-30 DIAGNOSIS — E785 Hyperlipidemia, unspecified: Secondary | ICD-10-CM

## 2010-09-30 NOTE — Progress Notes (Signed)
DREW CMET AND LIPID BAJORDAN, MLS

## 2010-10-01 ENCOUNTER — Encounter: Payer: Self-pay | Admitting: Home Health Services

## 2010-10-01 ENCOUNTER — Telehealth: Payer: Self-pay | Admitting: Family Medicine

## 2010-10-01 LAB — COMPREHENSIVE METABOLIC PANEL
ALT: 8 U/L (ref 0–35)
AST: 12 U/L (ref 0–37)
Alkaline Phosphatase: 31 U/L — ABNORMAL LOW (ref 39–117)
Sodium: 141 mEq/L (ref 135–145)
Total Bilirubin: 0.5 mg/dL (ref 0.3–1.2)
Total Protein: 5.9 g/dL — ABNORMAL LOW (ref 6.0–8.3)

## 2010-10-01 LAB — LIPID PANEL
HDL: 63 mg/dL (ref >39)
LDL Cholesterol: 92 mg/dL (ref 0–99)
Total CHOL/HDL Ratio: 2.7 Ratio
VLDL: 17 mg/dL (ref 0–40)

## 2010-10-01 NOTE — Telephone Encounter (Signed)
Wants to know lab results from yesterday.

## 2010-10-01 NOTE — Telephone Encounter (Signed)
Gave pt results. She wanted to know if she had to come in sooner than June. Told her I would send message to pcp & let her know

## 2010-10-01 NOTE — Telephone Encounter (Signed)
Spoke with pt & gave md message. States she has an appt she made this am for her bursitis

## 2010-10-01 NOTE — Telephone Encounter (Signed)
Her calcium level is normal, renal function at baseline, lipid panel is okay. I will see her in June, tell her to stay off the extra calcium supplement

## 2010-10-03 ENCOUNTER — Encounter: Payer: PRIVATE HEALTH INSURANCE | Admitting: Physical Therapy

## 2010-10-07 ENCOUNTER — Ambulatory Visit (INDEPENDENT_AMBULATORY_CARE_PROVIDER_SITE_OTHER): Payer: PRIVATE HEALTH INSURANCE | Admitting: Family Medicine

## 2010-10-07 ENCOUNTER — Encounter: Payer: Self-pay | Admitting: Family Medicine

## 2010-10-07 DIAGNOSIS — G609 Hereditary and idiopathic neuropathy, unspecified: Secondary | ICD-10-CM

## 2010-10-07 NOTE — Progress Notes (Signed)
  Subjective:    Patient ID: [name], female    DOB: Sep 04, 1927, 77 y.o.   MRN: [id number]  HPI     Here to follow-up lab results- stopped taking calium , Vit D, does not take any other supplements      Wants to know if she can take a new juice called Nopalea for inflammation, she brought a magazine in, she has been to Dr. Charlett Blake and given CSI in bilat shoulders 1 week ago for burisits, will return for right hip if not improved.     Peripheral neuropathy- has increased pains in feet at time and burning sensation, wants to increase her neurontin dose        Review of Systems per above      Objective:   Physical Exam    GEN- NAD,alert and oriented    Assessment & Plan:  I discussed with pt, after looking at magazine and on-line, it does not states if there are any extra fillers or chemicals in the juice supplement and I have no way to monitor its effects, I advised against it at this time, she is often very weary of new medications and gets anxious about her renal function, this will safe a lot of anxiety with an unknown factor

## 2010-10-07 NOTE — Assessment & Plan Note (Signed)
Resolved Pt has been on supplements for many years, unsure why slightly elevated. No renal supplments Restart at once a day, recheck at next visit ( 600mg  calcium daily)

## 2010-10-07 NOTE — Patient Instructions (Addendum)
Follow up in 2 months (June) for next appt Increase your neurontin to 300mg  three times a day Call if your blood sugars are below 60 and do not improve with eating, call if they are above 300 You can take 1 calcium tablet a day Your cholesterol looks okay, no change to medication needed I dont have info information to tell you how the Juice vitamin will affect you.

## 2010-10-07 NOTE — Assessment & Plan Note (Signed)
Increase neurontin to three times a day and reassess at next visit

## 2010-10-08 LAB — IRON AND TIBC
Saturation Ratios: 25 % (ref 20–55)
UIBC: 255 ug/dL

## 2010-10-08 LAB — RENAL FUNCTION PANEL
Albumin: 3.5 g/dL (ref 3.5–5.2)
Calcium: 9.5 mg/dL (ref 8.4–10.5)
Phosphorus: 3.7 mg/dL (ref 2.3–4.6)
Potassium: 4.6 mEq/L (ref 3.5–5.1)
Sodium: 140 mEq/L (ref 135–145)

## 2010-10-08 LAB — FERRITIN: Ferritin: 68 ng/mL (ref 10–291)

## 2010-10-08 LAB — POCT HEMOGLOBIN-HEMACUE: Hemoglobin: 11.2 g/dL — ABNORMAL LOW (ref 12.0–15.0)

## 2010-10-09 ENCOUNTER — Telehealth: Payer: Self-pay | Admitting: *Deleted

## 2010-10-09 LAB — POCT HEMOGLOBIN-HEMACUE: Hemoglobin: 10.4 g/dL — ABNORMAL LOW (ref 12.0–15.0)

## 2010-10-09 NOTE — Telephone Encounter (Signed)
Patient calls reporting she developed headache yesterday . She took a tylenol , not sure if it helped but she went to sleep and when she woke up no headache. Reports headache today. Concerned that she may have a tumor.  States she has checked  BP and it is good. Denies any vision changes.   Advised patient that we have no available appointment today but if she is really concerned she can go to Urgent Care to be checked out. She ask for appointment tomorrow and appointment is scheduled for 3:15 PM with Dr. Jeanice Lim.  Dr. Jeanice Lim notified  of report from patient.Marland Kitchen

## 2010-10-10 ENCOUNTER — Ambulatory Visit: Payer: PRIVATE HEALTH INSURANCE | Admitting: Family Medicine

## 2010-10-10 ENCOUNTER — Encounter: Payer: PRIVATE HEALTH INSURANCE | Admitting: Physical Therapy

## 2010-10-17 ENCOUNTER — Encounter: Payer: PRIVATE HEALTH INSURANCE | Admitting: Physical Therapy

## 2010-10-28 ENCOUNTER — Ambulatory Visit (INDEPENDENT_AMBULATORY_CARE_PROVIDER_SITE_OTHER): Payer: PRIVATE HEALTH INSURANCE | Admitting: Family Medicine

## 2010-10-28 ENCOUNTER — Encounter: Payer: Self-pay | Admitting: Family Medicine

## 2010-10-28 VITALS — BP 125/67 | HR 77 | Temp 98.1°F | Ht 67.5 in | Wt 172.6 lb

## 2010-10-28 DIAGNOSIS — J209 Acute bronchitis, unspecified: Secondary | ICD-10-CM

## 2010-10-28 DIAGNOSIS — J40 Bronchitis, not specified as acute or chronic: Secondary | ICD-10-CM

## 2010-10-28 MED ORDER — PREDNISONE 10 MG PO TABS: ORAL_TABLET | ORAL | Status: DC

## 2010-10-28 MED ORDER — IPRATROPIUM BROMIDE 0.02 % IN SOLN: 0.5000 mg | RESPIRATORY_TRACT | Status: DC

## 2010-10-28 MED ORDER — IPRATROPIUM BROMIDE 0.02 % IN SOLN
0.5000 mg | Freq: Once | RESPIRATORY_TRACT | Status: AC
  Administered 2010-10-28: 0.5 mg via RESPIRATORY_TRACT

## 2010-10-28 MED ORDER — ALBUTEROL SULFATE (2.5 MG/3ML) 0.083% IN NEBU
2.5000 mg | INHALATION_SOLUTION | Freq: Once | RESPIRATORY_TRACT | Status: AC
  Administered 2010-10-28: 2.5 mg via RESPIRATORY_TRACT

## 2010-10-28 MED ORDER — BENZONATATE 100 MG PO CAPS: 100.0000 mg | ORAL_CAPSULE | Freq: Three times a day (TID) | ORAL | Status: DC | PRN

## 2010-10-28 MED ORDER — ALBUTEROL SULFATE (2.5 MG/3ML) 0.083% IN NEBU: 2.5000 mg | INHALATION_SOLUTION | RESPIRATORY_TRACT | Status: DC

## 2010-10-28 MED ORDER — METHYLPREDNISOLONE SODIUM SUCC 125 MG IJ SOLR
125.0000 mg | Freq: Once | INTRAMUSCULAR | Status: AC
  Administered 2010-10-28: 125 mg via INTRAMUSCULAR

## 2010-10-28 MED ORDER — MOXIFLOXACIN HCL 400 MG PO TABS: 400.0000 mg | ORAL_TABLET | Freq: Every day | ORAL | Status: AC

## 2010-10-28 NOTE — Progress Notes (Signed)
  Subjective:    Patient ID: [name], female    DOB: 09-Jul-1927, 77 y.o.   MRN: [id number]  HPI  Pt here for wheezing and SOB. Friday did not feel well, was tired and had productive cough of yellow sputum, cough worsened and Sat pt began to wheeze. She tried her albuterol inhaler last pm  however did not think she was using it correctly at home, because her symptoms were unchanged.    Denies chest pain, fever, abdominal pain, +sick contacts with sister URI, +nausea, emesis x 1, NB/NB, +fatigue    Review of Systems per above    Objective:   Physical Exam   GEN- Mild respiratory distress,alert and oriented x 3, mentation normal, able to speak in short sentences   HEENT- MMM  NECK- supple, no JVD   CVS- RRR, 3/6 SEM   RESP- increased in expiratory phase, audible wheeze, increased WOB, mild retractions-supraclavicular, bilateral expiratory wheeze, + crackles Right base, fair air movement  ABD- Soft, NT, ND  EXT- no edema, pulses 2+     S/p nebulizer treatment- pt able to converse without difficulty. Oxygen sat was 92% on RA.   RESP- occ wheeze in upper quadrants, good air movement, ambulating w/o difficulty    Assessment & Plan:

## 2010-10-28 NOTE — Assessment & Plan Note (Signed)
Pt with recurrent reactive airway disease, will treat for acute bronchitis, pt has many co morbidities including renal disease, DM though well controlled. She has no history of lung disease. This may have started as a viral illness. Will give her an IM dose of Solumedrol with short prednisone burst Avelox to cover bronchitis and pt was admitted approx 2 months ago. Albuterol inhaler Pt given red flags RTC in AM for recheck

## 2010-10-28 NOTE — Patient Instructions (Signed)
Schedule a recheck with Luretha Murphy in the morning at [facility] the prednisone tomorrow- take 4 tablets  Start the Avelox (antibiotic ) tonight, take 1 tablet daily for 5 days You can take the cough tablet three times a day as needed Use the inhaler every 4 hours  If you have difficulty breathing or chest pain or increased wheezing then go to the hospital

## 2010-10-29 ENCOUNTER — Ambulatory Visit (INDEPENDENT_AMBULATORY_CARE_PROVIDER_SITE_OTHER): Payer: PRIVATE HEALTH INSURANCE | Admitting: Family Medicine

## 2010-10-29 ENCOUNTER — Encounter: Payer: Self-pay | Admitting: Family Medicine

## 2010-10-29 ENCOUNTER — Ambulatory Visit: Payer: PRIVATE HEALTH INSURANCE | Admitting: Family Medicine

## 2010-10-29 DIAGNOSIS — J40 Bronchitis, not specified as acute or chronic: Secondary | ICD-10-CM

## 2010-10-29 NOTE — Patient Instructions (Signed)
Continue the plan May get a little more swelling-so elevate Return apt in June

## 2010-10-29 NOTE — Assessment & Plan Note (Signed)
Significantly improved, she understands to continue plan.

## 2010-10-29 NOTE — Progress Notes (Signed)
  Subjective:    Patient ID: [name], female    DOB: 1927-11-15, 77 y.o.   MRN: [id number]  HPI  24 hour recheck for acute asthmatic bronchitis, yesterday she was treated with solumedrol and started on Avelox. She reports significant improvement. She has picked up the medications.  She slept well last night.  Review of Systems  Constitutional: Negative for fever and chills.  HENT: Negative for congestion.   Respiratory: Negative for chest tightness, shortness of breath and wheezing.   Cardiovascular: Positive for leg swelling.       Objective:   Physical Exam  Constitutional:       In no acute distress, appeared clinically better than when I briefly saw her yesterday.  HENT:  Right Ear: External ear normal.  Left Ear: External ear normal.  Cardiovascular: Normal rate and regular rhythm.   Pulmonary/Chest: Effort normal and breath sounds normal. She has no wheezes.          Assessment & Plan:

## 2010-10-30 ENCOUNTER — Telehealth: Payer: Self-pay | Admitting: Family Medicine

## 2010-10-30 NOTE — Telephone Encounter (Signed)
States she has CP when she coughs-only then. Says her dc papers said to go to hospital if CP. I reviewed signs of MI with her. She has none. Has only been on antibiotic 1.5 days. C/o mucus in throat. Advised plenty of fluids, sleep on 2 pillows. Give the antibiotic another day. Told her she should notice a difference tomorrow & will feel better slowly each day. To call even if we are closed. Told her we always have a doctor on call. She agreed with plan. Told me she does not like going to the hospital

## 2010-10-30 NOTE — Telephone Encounter (Signed)
Pt asking to speak with MD about some symptoms, says her chest hurts when she coughs & has mucus.

## 2010-11-07 ENCOUNTER — Encounter: Payer: Self-pay | Admitting: Family Medicine

## 2010-11-11 ENCOUNTER — Other Ambulatory Visit: Payer: Self-pay | Admitting: Family Medicine

## 2010-11-11 MED ORDER — FENOFIBRATE 48 MG PO TABS: 48.0000 mg | ORAL_TABLET | Freq: Every day | ORAL | Status: DC

## 2010-11-11 MED ORDER — AMLODIPINE BESYLATE 10 MG PO TABS: 10.0000 mg | ORAL_TABLET | Freq: Every day | ORAL | Status: DC

## 2010-11-11 MED ORDER — CARVEDILOL 6.25 MG PO TABS: 6.2500 mg | ORAL_TABLET | Freq: Two times a day (BID) | ORAL | Status: DC

## 2010-11-11 NOTE — Telephone Encounter (Signed)
Sent via [facility] order form

## 2010-11-12 NOTE — Procedures (Signed)
RENAL ARTERY DUPLEX EVALUATION   INDICATION:  Uncontrolled hypertension.   HISTORY:  Diabetes:  Yes.  Cardiac:  No.  Hypertension:  Yes.  Smoking:  No.   RENAL ARTERY DUPLEX FINDINGS:  Aorta-Proximal:  131 cm/s  Aorta-Mid:  164 cm/s  Aorta-Distal:  110 cm/s  Celiac Artery Origin:  166 cm/s  SMA Origin:  181 cm/s                                    RIGHT               LEFT  Renal Artery Origin:             120 cm/s            152 cm/s  Renal Artery Proximal:           113 cm/s            81 cm/s  Renal Artery Mid:                111 cm/s            115 cm/s  Renal Artery Distal:             71 cm/s             115 cm/s  Hilar Acceleration Time (AT):    0.058 sec           0.039 sec  Renal-Aortic Ratio (RAR):        0.92                1.16  Kidney Size:                     11.1 cm             10.7 cm  End Diastolic Ratio (EDR):  Resistive Index (RI):            0.74                0.73   IMPRESSION:  1. Normal renal artery duplex with no evidence of stenosis      bilaterally.  2. Borderline abnormal bilateral RI.  3. Evidence of cystic-like structures bilaterally with largest on the      right measuring 3.98 cm x 3.99 cm.  4. On the left, there is a more echogenic nodule-like structure      measuring 1.82 cm x 1.75 cm.  5. Structures correlate with renal study done in other facility.  6. Kidneys appear normal in regard to shape and size.   ___________________________________________  Janetta Hora. Fields, MD   AS/MEDQ  D:  05/22/2008  T:  05/22/2008  Job:  [id number]

## 2010-11-18 ENCOUNTER — Telehealth: Payer: Self-pay | Admitting: Sports Medicine

## 2010-11-18 NOTE — Telephone Encounter (Signed)
Pt calling re: low CBG.  68.  Woke up sweating.  Notes she is on glipizide and didn't eat much during the day, then ate a very small dinner.  She ate something and CBG came up to 90.   No current symptoms.  Advised this is ok, if she is going to eat a small dinner then eat a small bedtime snack as well to keep from getting hypoglycemic at night.  Pt agreeable.

## 2010-12-10 ENCOUNTER — Ambulatory Visit (INDEPENDENT_AMBULATORY_CARE_PROVIDER_SITE_OTHER): Payer: PRIVATE HEALTH INSURANCE | Admitting: Family Medicine

## 2010-12-10 ENCOUNTER — Encounter: Payer: Self-pay | Admitting: Family Medicine

## 2010-12-10 VITALS — BP 118/67 | HR 90 | Temp 98.3°F | Ht 67.5 in | Wt 175.0 lb

## 2010-12-10 DIAGNOSIS — I10 Essential (primary) hypertension: Secondary | ICD-10-CM

## 2010-12-10 DIAGNOSIS — N183 Chronic kidney disease, stage 3 unspecified: Secondary | ICD-10-CM

## 2010-12-10 DIAGNOSIS — Z23 Encounter for immunization: Secondary | ICD-10-CM

## 2010-12-10 DIAGNOSIS — E785 Hyperlipidemia, unspecified: Secondary | ICD-10-CM

## 2010-12-10 DIAGNOSIS — E119 Type 2 diabetes mellitus without complications: Secondary | ICD-10-CM

## 2010-12-10 MED ORDER — GLIPIZIDE 5 MG PO TABS: 5.0000 mg | ORAL_TABLET | Freq: Every day | ORAL | Status: DC

## 2010-12-10 NOTE — Patient Instructions (Addendum)
Start taking Glucotrol 5mg  daily You can get sugar tablets over the counter  Next visit in 6 months with [name] Today you got your tetanus Booster If your sugars are still below 70 even when you can your diabetes medication please call in

## 2010-12-10 NOTE — Assessment & Plan Note (Signed)
Continue to follow with CKD every 6 months, pt cr between 1.6-1.9 at most checks

## 2010-12-10 NOTE — Progress Notes (Signed)
  Subjective:    Patient ID: [name], female    DOB: 09-07-1927, 77 y.o.   MRN: [id number]  HPI Diabetes- had had hypoglyemic episodes down to 60's and symptomatic, taking Glucotrol 10mg  , when episodes occur feels flushed and sweaty, after eating blood sugars return to low 100's  HTN- no difficulties with medications, no CP, no HA, no leg swelling tolerating Norvasc and Coreg CKD- followed by [facility], reviewed previous labs from April Bronchitis- treated for bronchitis in May, overall much improved, no cough, no SOB currently  Reviewed labs,  TDAP needed today Pt declined colonoscopy Eye doctor [facility]- Joellyn Quails     Review of Systems  Per above     Objective:   Physical Exam  GEN- NAD, alert and oriented  CVS- RRR, 2/6 SEM  RESP- CTAB  EXT- no edema       Assessment & Plan:

## 2010-12-10 NOTE — Assessment & Plan Note (Signed)
At goal with current meds 

## 2010-12-10 NOTE — Assessment & Plan Note (Signed)
Now back on Calcium once a day, monitor with other routine labs

## 2010-12-10 NOTE — Assessment & Plan Note (Signed)
At goal, tolerating meds

## 2010-12-10 NOTE — Assessment & Plan Note (Signed)
With hypoglycemia episodes and very well controlled diabetes- A1C < 6.5 , will decrease Glucotrol to 5mg , recommend repeat A1C at next visit Pt to call if CBG are increasing

## 2010-12-11 ENCOUNTER — Telehealth: Payer: Self-pay | Admitting: Family Medicine

## 2010-12-11 NOTE — Telephone Encounter (Signed)
Called patient and informed of lab results.Busick, Rodena Medin

## 2010-12-11 NOTE — Telephone Encounter (Signed)
Call her back with her and Mr. Campanella lab results.

## 2010-12-16 ENCOUNTER — Telehealth: Payer: Self-pay | Admitting: *Deleted

## 2010-12-16 ENCOUNTER — Inpatient Hospital Stay (INDEPENDENT_AMBULATORY_CARE_PROVIDER_SITE_OTHER)
Admission: RE | Admit: 2010-12-16 | Discharge: 2010-12-16 | Disposition: A | Discharge status: Home / Self Care | Payer: PRIVATE HEALTH INSURANCE | Source: Ambulatory Visit | Attending: Family Medicine | Admitting: Family Medicine

## 2010-12-16 DIAGNOSIS — J40 Bronchitis, not specified as acute or chronic: Secondary | ICD-10-CM

## 2010-12-16 NOTE — Telephone Encounter (Signed)
Patent states she developed symptoms of bronchitis 3-4 days ago. States she has a dry cough and shortness of breath. She has used the inhaler but has not helped much. Advised patient to go to Urgent Care today  and she voices understanding.

## 2010-12-24 ENCOUNTER — Encounter: Payer: Self-pay | Admitting: Family Medicine

## 2010-12-26 ENCOUNTER — Ambulatory Visit (INDEPENDENT_AMBULATORY_CARE_PROVIDER_SITE_OTHER): Payer: PRIVATE HEALTH INSURANCE | Admitting: Family Medicine

## 2010-12-26 ENCOUNTER — Encounter: Payer: Self-pay | Admitting: Family Medicine

## 2010-12-26 VITALS — BP 122/68 | HR 76 | Temp 97.9°F | Ht 67.5 in | Wt 170.0 lb

## 2010-12-26 DIAGNOSIS — J40 Bronchitis, not specified as acute or chronic: Secondary | ICD-10-CM | POA: Insufficient documentation

## 2010-12-26 MED ORDER — METHYLPREDNISOLONE SODIUM SUCC 125 MG IJ SOLR
125.0000 mg | Freq: Once | INTRAMUSCULAR | Status: AC
  Administered 2010-12-26: 125 mg via INTRAMUSCULAR

## 2010-12-26 MED ORDER — BENZONATATE 100 MG PO CAPS: 100.0000 mg | ORAL_CAPSULE | Freq: Four times a day (QID) | ORAL | Status: DC | PRN

## 2010-12-26 MED ORDER — METHYLPREDNISOLONE SODIUM SUCC 125 MG IJ SOLR: 125.0000 mg | Freq: Once | INTRAMUSCULAR | Status: DC

## 2010-12-26 NOTE — Progress Notes (Signed)
  Subjective:    Patient ID: [name], female    DOB: 1928-02-09, 77 y.o.   MRN: [id number]  HPI  1. Bronchitis: Was Dx at the [facility], given Zpack, still with cough and wheeze. No SOB. No fever/chills, N/V/D, LE edema. No CXR at [facility]. Wheeze better with albuterol.  Review of Systems SEE HPI.    Objective:   Physical Exam  Vitals reviewed. Constitutional: She appears well-developed and well-nourished. No distress.  Cardiovascular: Normal rate, regular rhythm, normal heart sounds and intact distal pulses.   Pulmonary/Chest: Effort normal. No respiratory distress. She has wheezes. She has no rales.  Skin: Skin is warm and dry.      Assessment & Plan:

## 2010-12-26 NOTE — Patient Instructions (Signed)
It was nice to meet you today!  Remember that this cough may linger for one month.

## 2010-12-26 NOTE — Assessment & Plan Note (Signed)
Resolving, but still with post-bronchitic cough. Rx Solumedrol in office today with Rx Tessalon for home. Advised patient that cough may linger for one month. Precautions discussed.

## 2010-12-27 ENCOUNTER — Telehealth: Payer: Self-pay | Admitting: Family Medicine

## 2010-12-27 ENCOUNTER — Other Ambulatory Visit: Payer: Self-pay | Admitting: Family Medicine

## 2010-12-27 MED ORDER — GLIPIZIDE 5 MG PO TABS: 5.0000 mg | ORAL_TABLET | Freq: Every day | ORAL | Status: DC

## 2010-12-27 NOTE — Telephone Encounter (Signed)
Ms. Gains returned Dr. Laurie Panda call and the clarification of the medication is that it is Glipizide.  She is sorry she missed Dr. Laurie Panda call.

## 2010-12-27 NOTE — Telephone Encounter (Signed)
Forward to PCP for refill

## 2010-12-27 NOTE — Telephone Encounter (Signed)
If patient returns call please tell her Rx was sent to pharmacy.Lamya Lausch, Rodena Medin

## 2010-12-27 NOTE — Telephone Encounter (Signed)
Need the prescription for Lipiside sent to [facility] - [phone].

## 2010-12-27 NOTE — Telephone Encounter (Signed)
I am assuming this is Glipizide, I have tried to call the patient back and line is constantly busy.

## 2011-01-02 ENCOUNTER — Telehealth: Payer: Self-pay | Admitting: Family Medicine

## 2011-01-02 NOTE — Telephone Encounter (Signed)
Returned call.  No answer and no voice mail to leave a message.  Will try again later.

## 2011-01-02 NOTE — Telephone Encounter (Signed)
Has questions about her Blood Sugar and how the medication is working and how she needs to eat.

## 2011-01-03 NOTE — Telephone Encounter (Signed)
Patient's Glucotrol was deceased to 5 mg due to hypoglycemic episodes.  She called today stating that her bloods sugars are still running low.  She only checks them when she becomes symptomatic.  The lowest one she recorded recently was 69.  She is concerned that she is not eating correctly.  I asked her about the types of foods that she eats which sounded appropriate however, she only eats 3 meals a day.  Advised her to meet with Dr. Gerilyn Pilgrim to discuss.  Appointment set up for next Thursday at 11 am.

## 2011-01-09 ENCOUNTER — Ambulatory Visit: Payer: PRIVATE HEALTH INSURANCE | Admitting: Family Medicine

## 2011-01-14 ENCOUNTER — Telehealth: Payer: Self-pay | Admitting: Family Medicine

## 2011-01-14 NOTE — Telephone Encounter (Signed)
[name] cancelled her nutr appt this week, saying she had figured out that her sugars do not bottom out if she eats.  Said she will call to R/S nutr appt at a later date.

## 2011-01-17 ENCOUNTER — Telehealth: Payer: Self-pay | Admitting: Family Medicine

## 2011-01-17 NOTE — Telephone Encounter (Signed)
Please call Ms. regarding the AWV and glucometer.

## 2011-01-20 NOTE — Telephone Encounter (Signed)
Spoke with Kathie Rhodes about scheduling a wellness visit

## 2011-02-06 ENCOUNTER — Ambulatory Visit (INDEPENDENT_AMBULATORY_CARE_PROVIDER_SITE_OTHER): Payer: PRIVATE HEALTH INSURANCE | Admitting: Family Medicine

## 2011-02-06 ENCOUNTER — Ambulatory Visit (HOSPITAL_COMMUNITY)
Admission: RE | Admit: 2011-02-06 | Discharge: 2011-02-06 | Disposition: A | Discharge status: Home / Self Care | Payer: PRIVATE HEALTH INSURANCE | Source: Ambulatory Visit | Attending: Family Medicine | Admitting: Family Medicine

## 2011-02-06 ENCOUNTER — Other Ambulatory Visit: Payer: Self-pay

## 2011-02-06 ENCOUNTER — Telehealth: Payer: Self-pay | Admitting: Family Medicine

## 2011-02-06 ENCOUNTER — Encounter: Payer: Self-pay | Admitting: Family Medicine

## 2011-02-06 VITALS — BP 160/83 | HR 80 | Temp 98.3°F | Ht 67.5 in | Wt 170.0 lb

## 2011-02-06 DIAGNOSIS — I10 Essential (primary) hypertension: Secondary | ICD-10-CM

## 2011-02-06 DIAGNOSIS — M25511 Pain in right shoulder: Secondary | ICD-10-CM | POA: Insufficient documentation

## 2011-02-06 DIAGNOSIS — J209 Acute bronchitis, unspecified: Secondary | ICD-10-CM

## 2011-02-06 DIAGNOSIS — I4891 Unspecified atrial fibrillation: Secondary | ICD-10-CM | POA: Insufficient documentation

## 2011-02-06 DIAGNOSIS — J42 Unspecified chronic bronchitis: Secondary | ICD-10-CM

## 2011-02-06 DIAGNOSIS — M25519 Pain in unspecified shoulder: Secondary | ICD-10-CM

## 2011-02-06 DIAGNOSIS — J40 Bronchitis, not specified as acute or chronic: Secondary | ICD-10-CM

## 2011-02-06 DIAGNOSIS — J45909 Unspecified asthma, uncomplicated: Secondary | ICD-10-CM

## 2011-02-06 DIAGNOSIS — I4460 Unspecified fascicular block: Secondary | ICD-10-CM | POA: Insufficient documentation

## 2011-02-06 MED ORDER — SPACER/AERO CHAMBER MOUTHPIECE MISC: 2.0000 | Freq: Four times a day (QID) | Status: DC | PRN

## 2011-02-06 MED ORDER — ALBUTEROL SULFATE HFA 108 (90 BASE) MCG/ACT IN AERS: 2.0000 | INHALATION_SPRAY | RESPIRATORY_TRACT | Status: DC | PRN

## 2011-02-06 MED ORDER — PREDNISONE 20 MG PO TABS: 60.0000 mg | ORAL_TABLET | Freq: Every day | ORAL | Status: AC

## 2011-02-06 NOTE — Progress Notes (Signed)
Addended by: Zachery Dauer on: 02/06/2011 04:27 PM   Modules accepted: Orders, Level of Service

## 2011-02-06 NOTE — Telephone Encounter (Signed)
States that the    [employer] MISC   Cost too much and needs something cheaper.

## 2011-02-06 NOTE — Assessment & Plan Note (Signed)
Has an appointment with Dr Charlett Blake next week. She can use low dose Acetaminophen and we'll see how much systemic prednisone helps

## 2011-02-06 NOTE — Patient Instructions (Signed)
[facility] Darl Pikes and Dr Sheffield Slider  Call if you develop chest pain or dizziness. You have atrial fibrillation, but it may go away when your breathing is better.   Use your Albuterol rescue medicine as needed with the spacer.   Take the prednisone 3 tablets daily. If better in a couple days you can gradually decrease the prednisone dose.

## 2011-02-06 NOTE — Progress Notes (Signed)
  Subjective:    Patient ID: [name], female    DOB: February 15, 1928, 77 y.o.   MRN: [id number]  HPI Bronchitis x 10 days with mild preceding congestion and non-productive cough. Chronically shortness of breath with dyspnea on exertion and bending over.  restricting ambulation to a few blocks. Never smoked, but passive exposure.   No chest pain, palpitations or dizziness.   Shoulder pain chronically, worse today. Has an appt to see Dr Charlett Blake who injected her right shoulder last winter. It does hurt her at night. Her neck also hurts.   Leg pains in lower legs for some time. Has lumpy feeling in the calves.     Review of Systems     Objective:   Physical Exam  Constitutional: She appears well-developed and well-nourished.  Eyes: Conjunctivae are normal.  Neck: Normal range of motion.  Cardiovascular: Normal rate.        Irregular rhythm, atrial fibrillation on EKG. No murmur heard, but wheezing  Pulmonary/Chest: Effort normal. No stridor. No respiratory distress. She has wheezes. She has no rales.       Moving air well clinically, but peak flows below 100.   Musculoskeletal: She exhibits edema. She exhibits no tenderness.       1+ edema right ankle. Calves have mild varicosities. No tenderness Unable to abduct right shoulder above 90 degrees, but negative impingement sign.   Neurological: She is alert.  Psychiatric: She has a normal mood and affect. Her behavior is normal.          Assessment & Plan:

## 2011-02-06 NOTE — Telephone Encounter (Signed)
Please call her to come get a spacer from our office and enter the charge for it

## 2011-02-06 NOTE — Assessment & Plan Note (Addendum)
Exacerbation due to mild URI. Appears to be chronic with increasing dyspnea.  Causing and/or worsened by atrial fibrillation. The Coreg may need to be replace for more beta selectivity. Will give short course of steroids and re-evaluate next week.

## 2011-02-06 NOTE — Telephone Encounter (Signed)
Called pt to pick up peak flow meter. [name], [name]

## 2011-02-06 NOTE — Telephone Encounter (Signed)
Fwd. To Dr.Hale .Hoyte Ziebell  

## 2011-02-06 NOTE — Assessment & Plan Note (Signed)
May be from the COPD exacerbation an will resolve with its improvement. No symptoms to delineate how long she's been in this rhythm. Rate is controlled. On Coreg. No chest pain or CHF to require cardioversion. [place] 2 score is 3. She was made aware of small stroke risk and will continue aspirin. If still present in one week will check TSH and discuss anticoagulation.

## 2011-02-13 ENCOUNTER — Ambulatory Visit (INDEPENDENT_AMBULATORY_CARE_PROVIDER_SITE_OTHER): Payer: PRIVATE HEALTH INSURANCE | Admitting: Family Medicine

## 2011-02-13 ENCOUNTER — Encounter: Payer: Self-pay | Admitting: Family Medicine

## 2011-02-13 DIAGNOSIS — M25519 Pain in unspecified shoulder: Secondary | ICD-10-CM

## 2011-02-13 DIAGNOSIS — M25511 Pain in right shoulder: Secondary | ICD-10-CM

## 2011-02-13 DIAGNOSIS — J42 Unspecified chronic bronchitis: Secondary | ICD-10-CM

## 2011-02-13 DIAGNOSIS — E785 Hyperlipidemia, unspecified: Secondary | ICD-10-CM

## 2011-02-13 DIAGNOSIS — I10 Essential (primary) hypertension: Secondary | ICD-10-CM

## 2011-02-13 NOTE — Assessment & Plan Note (Signed)
Pt to f/up with Dr. Charlett Blake as scheduled tomorrow.

## 2011-02-13 NOTE — Progress Notes (Deleted)
  Subjective:    Patient ID: [name], female    DOB: 12-05-1927, 77 y.o.   MRN: [id number]  HPI                                            Review of Systems     Objective:   Physical Exam        Assessment & Plan:

## 2011-02-13 NOTE — Assessment & Plan Note (Addendum)
Well controlled currently on regimen of norvasc 10mg and coreg 6.25mg bid  

## 2011-02-13 NOTE — Assessment & Plan Note (Signed)
F/up with  Dr. Cherie Ouch as schedule for in 6 months.

## 2011-02-13 NOTE — Assessment & Plan Note (Addendum)
Tricor stopped since limited evidence regarding benefits of lowering triglycerides.  Last triglyceride lab showed level wnl.  Will recheck  Lipid panel and CMET in 07/2012.  Continue fish oil and Lipitor.

## 2011-02-13 NOTE — Patient Instructions (Signed)
Stop taking Tricor (fenofibrate).  You no longer need to take this Your blood pressure looks GREAT! Please set up an appointment with Dr. Raymondo Band- to do pulmonary function test Make an appointment to follow up with me in 2-3 months.

## 2011-02-13 NOTE — Assessment & Plan Note (Signed)
No resolved.  Pt back at baseline.  Will have pt scheduled for PFT's with Dr. Raymondo Band

## 2011-02-13 NOTE — Progress Notes (Signed)
  Subjective:    Patient ID: [name], female    DOB: 05-Jul-1927, 77 y.o.   MRN: [id number]  HPI  F/up for bronchitis: Breathing gradually has gotten better. Finished the 5 days of Prednisone. No side effects from that. No MDI use this AM.    bilateral shoulder pain: Right and left shoulder hurt her.  This is a chronic issue and she sees Dr. Charlett Blake for this.  Usually can not lift arms above head or reach behind her due to pain.  Pt states she has been told that she has arthritis in both of her shoulders.  Has an appt with Dr Charlett Blake tomorrow.    SH: lives at home with husband who is smoker.  Passive smoke exposure.   Review of Systems No changes in appetite.  No weight loss.  No sob. No wheezing.  No fever.  No joint redness.     Objective:   Physical Exam  Constitutional: She is oriented to person, place, and time. She appears well-developed and well-nourished.  Cardiovascular: Normal rate and regular rhythm.   No murmur heard. Pulmonary/Chest: Effort normal and breath sounds normal. No respiratory distress. She has no wheezes.  Musculoskeletal:       Shoulder exam: Unable to abduct arm above level of head.   Some mild pain with internal rotation of shoulder.    Strength 5/5 in arms bilateral.  Sensation grossly intact.   Neurological: She is alert and oriented to person, place, and time.  Skin: No rash noted.  Psychiatric: She has a normal mood and affect. Her behavior is normal. Thought content normal.     Peak flow best result 145     Assessment & Plan:

## 2011-02-24 ENCOUNTER — Encounter: Payer: Self-pay | Admitting: Family Medicine

## 2011-02-24 ENCOUNTER — Ambulatory Visit (INDEPENDENT_AMBULATORY_CARE_PROVIDER_SITE_OTHER): Payer: PRIVATE HEALTH INSURANCE | Admitting: Family Medicine

## 2011-02-24 VITALS — BP 120/69 | HR 80 | Wt 171.0 lb

## 2011-02-24 DIAGNOSIS — I4891 Unspecified atrial fibrillation: Secondary | ICD-10-CM

## 2011-02-24 DIAGNOSIS — I872 Venous insufficiency (chronic) (peripheral): Secondary | ICD-10-CM

## 2011-02-24 HISTORY — DX: Venous insufficiency (chronic) (peripheral): I87.2

## 2011-02-24 NOTE — Assessment & Plan Note (Signed)
Add compression stockings during the day, restrict salt.  Recheck in a few weeks with primary MD

## 2011-02-24 NOTE — Assessment & Plan Note (Signed)
Patient is not anticoagulated, will likely need cardiology referral. Falling at home intermittently could make anticoagulation contraindicated.

## 2011-02-24 NOTE — Patient Instructions (Addendum)
[facility] for compression stockings 20-30 mm Elevate your legs No salt Dr. Edmonia [name] in the next month to follow up on A fib

## 2011-02-24 NOTE — Progress Notes (Signed)
  Subjective:    Patient ID: [name], female    DOB: 02/26/1928, 77 y.o.   MRN: [id number]  HPI Leg edema for the past week.  Absent first in the morning, increases throughout day.  Has had this problem in the past.  Larey Seat last week, tripped and hit right knee, that is bruised. Not having any problems walking.  2 steroid bursts in the past month for COPD flair, also on amlodipine for HTN.  Patient endorse a high sodium diet.   Review of Systems  Constitutional: Negative for fever.  Respiratory: Negative for cough, chest tightness, shortness of breath and wheezing.   Cardiovascular: Positive for leg swelling. Negative for chest pain and palpitations.  Skin:       bruising around right knee.       Objective:   Physical Exam  Constitutional:       Well appearing, in no distress  Cardiovascular:       Irregular rhythm  Pulmonary/Chest: Effort normal and breath sounds normal. She has no wheezes. She has no rales.  Musculoskeletal: She exhibits edema.       2+ leg edema.  Right knee with bruising around the knee, good ROM.           Assessment & Plan:

## 2011-03-04 ENCOUNTER — Ambulatory Visit (INDEPENDENT_AMBULATORY_CARE_PROVIDER_SITE_OTHER): Payer: PRIVATE HEALTH INSURANCE | Admitting: Pharmacist

## 2011-03-04 ENCOUNTER — Encounter: Payer: Self-pay | Admitting: Pharmacist

## 2011-03-04 VITALS — BP 117/61 | HR 64 | Ht 67.0 in | Wt 175.8 lb

## 2011-03-04 DIAGNOSIS — J449 Chronic obstructive pulmonary disease, unspecified: Secondary | ICD-10-CM

## 2011-03-04 NOTE — Patient Instructions (Signed)
Near Normal Spirometry No change in inhaler medications at this time.

## 2011-03-04 NOTE — Progress Notes (Signed)
  Subjective:    Patient ID: [name], female    DOB: 1928-06-22, 77 y.o.   MRN: [id number]  HPI Patient with long history of tobacco exposure (second hand smoke) - never smoker.   Arrives for evaluation of lung function post bronchitis.    Review of Systems     Objective:   Physical Exam See Documentation Flowsheet for PFT results.         Assessment & Plan:  Spirometry evaluation with Pre and Post Bronchodilator reveals near  normal lung function.  Patient has been experiencing Shortness of breat with exercise and taking albuterol for relief of symptoms.  Continue current treatment plan at this time.  Reviewed results of pulmonary function tests.  Pt verbalized understanding of results.  Written pt instructions provided.  F/U Clinic visit with Dr. Edmonia [name].   TTFFC 25 minutes.  Patient seen with Tomi Bamberger, PharmD Candidate and Benjaman Pott, Pharmacy Resident

## 2011-03-04 NOTE — Assessment & Plan Note (Signed)
Spirometry evaluation with Pre and Post Bronchodilator reveals near  normal lung function.  Patient has been experiencing Shortness of breat with exercise and taking albuterol for relief of symptoms.  Continue current treatment plan at this time.  Reviewed results of pulmonary function tests.  Pt verbalized understanding of results.  Written pt instructions provided.  F/U Clinic visit with Dr. Edmonia [name].   TTFFC 25 minutes.  Patient seen with Tomi Bamberger, PharmD Candidate and Benjaman Pott, Pharmacy Resident

## 2011-03-04 NOTE — Progress Notes (Signed)
  Subjective:    Patient ID: [name], female    DOB: Mar 29, 1928, 77 y.o.   MRN: [id number]  HPI Reviewed and agree with Dr. Macky Lower management.     Review of Systems     Objective:   Physical Exam        Assessment & Plan:

## 2011-03-05 ENCOUNTER — Telehealth: Payer: Self-pay | Admitting: Family Medicine

## 2011-03-05 NOTE — Telephone Encounter (Signed)
Ms. Albaugh is concerned about the weight and height that were written down.  She would like to speak with Dr. Raymondo Band about this.

## 2011-03-07 ENCOUNTER — Telehealth: Payer: Self-pay | Admitting: Pharmacist

## 2011-03-07 NOTE — Telephone Encounter (Signed)
Patient clarified height as 67 inches.

## 2011-03-11 ENCOUNTER — Encounter: Payer: Self-pay | Admitting: Family Medicine

## 2011-03-11 ENCOUNTER — Ambulatory Visit (INDEPENDENT_AMBULATORY_CARE_PROVIDER_SITE_OTHER): Payer: PRIVATE HEALTH INSURANCE | Admitting: Family Medicine

## 2011-03-11 VITALS — BP 123/68 | HR 72 | Wt 168.2 lb

## 2011-03-11 DIAGNOSIS — I10 Essential (primary) hypertension: Secondary | ICD-10-CM

## 2011-03-11 DIAGNOSIS — I4891 Unspecified atrial fibrillation: Secondary | ICD-10-CM

## 2011-03-11 DIAGNOSIS — E119 Type 2 diabetes mellitus without complications: Secondary | ICD-10-CM

## 2011-03-11 LAB — POCT GLYCOSYLATED HEMOGLOBIN (HGB A1C): Hemoglobin A1C: 6.8

## 2011-03-11 NOTE — Progress Notes (Signed)
  Subjective:    Patient ID: [name], female    DOB: 12-18-1927, 77 y.o.   MRN: [id number]  HPI  A fib:  identified in 02/06/11 visit for COPD exacerbation. Found on EKG.  Thought that it may be from the COPD exacerbation and would resolve with its improvement. No symptoms to delineate how long she's been in this rhythm. Rate is controlled. On Coreg. No chest pain or CHF.  No SOB.  Has had 2 falls in past year.  No history of stroke.   Hypertension: Patient states she is taking medication as directed. Blood pressure 123/68.  lower extreme the swelling: Patient states that this is now resolved. Using compression hose as needed.  Diabetes: Patient states she checks her blood sugar one time per day in the morning. Usually in the low 100s. Agrees to checking A1c today. States she tries to avoid concentrated sweets.    Review of Systems    no chest pain. No shortness of breath. As per history of present illness. Objective:   Physical Exam  Constitutional: She is oriented to person, place, and time. She appears well-developed and well-nourished.  HENT:  Head: Normocephalic and atraumatic.  Cardiovascular: Normal rate.        Irregular rhythm.  Pulmonary/Chest: Effort normal and breath sounds normal. No respiratory distress. She has no wheezes.  Abdominal: Soft. She exhibits no distension. There is no tenderness.  Musculoskeletal: She exhibits no edema.  Neurological: She is alert and oriented to person, place, and time.  Skin: No rash noted.  Psychiatric: She has a normal mood and affect. Her behavior is normal.          Assessment & Plan:

## 2011-03-12 ENCOUNTER — Telehealth: Payer: Self-pay | Admitting: Family Medicine

## 2011-03-12 NOTE — Telephone Encounter (Signed)
Pt wanted [place] to make sure pharm was [facility].

## 2011-03-13 ENCOUNTER — Telehealth: Payer: Self-pay | Admitting: Family Medicine

## 2011-03-13 MED ORDER — GABAPENTIN 300 MG PO CAPS: 300.0000 mg | ORAL_CAPSULE | Freq: Three times a day (TID) | ORAL | Status: DC

## 2011-03-13 NOTE — Telephone Encounter (Signed)
Pt is supposed to go see heart doctor and doesn't know when it is.

## 2011-03-13 NOTE — Telephone Encounter (Signed)
Referral now in computer.  Please see order and consult request letter.  Thanks, [employer]

## 2011-03-13 NOTE — Telephone Encounter (Signed)
Called pt. Do not see referral in computer. I told the pt, that I would ask Dr.Caviness and call her back. (pt can go any time) .[name]

## 2011-03-14 NOTE — Telephone Encounter (Signed)
Called and left message. Need appt for this pt. I have faxed a request for referral to them. [name], [name]

## 2011-03-15 ENCOUNTER — Other Ambulatory Visit: Payer: Self-pay | Admitting: Family Medicine

## 2011-03-15 DIAGNOSIS — I4891 Unspecified atrial fibrillation: Secondary | ICD-10-CM

## 2011-03-15 DIAGNOSIS — N289 Disorder of kidney and ureter, unspecified: Secondary | ICD-10-CM

## 2011-03-15 NOTE — Assessment & Plan Note (Signed)
Well controlled currently on regimen of norvasc 10mg  and coreg 6.25mg  bid

## 2011-03-15 NOTE — Assessment & Plan Note (Signed)
A1C  6.8, Gluctrol 5mg  daily, being titrated to avoid hypoglycemic episodes. No Metformin secondary to CKD.

## 2011-03-15 NOTE — Patient Instructions (Signed)
Pt to make f/up appointment after her appointment with cardiology.

## 2011-03-15 NOTE — Assessment & Plan Note (Signed)
A fib identified in 02/06/11 visit for COPD exacerbation. Found on EKG.  Thought that it may be from the COPD exacerbation an would resolve with its improvement. No symptoms to delineate how long she's been in this rhythm. Rate is controlled. On Coreg. No chest pain or CHF to require cardioversion. [place] 2 score is 3. Has had 2 falls in past year. She was made aware of small stroke risk and will continue aspirin. TSH pending.  Discussed pros and cons of anticoagulation with both patient and my attending physician. This is a difficult case in that patient is 77 years old and has had 2 falls in past year, it over all is a very active individual. Will refer patient to see cardiologist for evaluation and for their input regarding if we should go forward with anticoagulation in this patient.

## 2011-03-17 ENCOUNTER — Telehealth: Payer: Self-pay | Admitting: Family Medicine

## 2011-03-17 NOTE — Telephone Encounter (Signed)
Called pt and informed of appt at [facility] 04-02-11 .Arlyss Repress

## 2011-03-17 NOTE — Telephone Encounter (Signed)
needs auth for her proventil inhaler for her insurance - she cannot afford this med and needs doctor to call to authorize [phone]

## 2011-03-17 NOTE — Telephone Encounter (Signed)
[employer] and form for PA has been received. Form placed in MD box. Tried to call patient to let her know, phone busy X 2.

## 2011-03-18 NOTE — Telephone Encounter (Signed)
Message left on voicemail that we are working on PA for proventil

## 2011-03-19 ENCOUNTER — Ambulatory Visit (INDEPENDENT_AMBULATORY_CARE_PROVIDER_SITE_OTHER): Payer: PRIVATE HEALTH INSURANCE | Admitting: *Deleted

## 2011-03-19 ENCOUNTER — Other Ambulatory Visit: Payer: PRIVATE HEALTH INSURANCE

## 2011-03-19 DIAGNOSIS — Z23 Encounter for immunization: Secondary | ICD-10-CM

## 2011-03-19 DIAGNOSIS — I4891 Unspecified atrial fibrillation: Secondary | ICD-10-CM

## 2011-03-19 DIAGNOSIS — N289 Disorder of kidney and ureter, unspecified: Secondary | ICD-10-CM

## 2011-03-19 LAB — BASIC METABOLIC PANEL
Calcium: 9.1 mg/dL (ref 8.4–10.5)
Creat: 1.42 mg/dL — ABNORMAL HIGH (ref 0.50–1.10)
Sodium: 144 mEq/L (ref 135–145)

## 2011-03-19 LAB — TSH: TSH: 2.49 u[IU]/mL (ref 0.350–4.500)

## 2011-03-19 NOTE — Progress Notes (Signed)
Bmp and tsh done today [name] 

## 2011-03-20 ENCOUNTER — Other Ambulatory Visit: Payer: Self-pay | Admitting: Family Medicine

## 2011-03-20 DIAGNOSIS — J45909 Unspecified asthma, uncomplicated: Secondary | ICD-10-CM

## 2011-03-20 DIAGNOSIS — J40 Bronchitis, not specified as acute or chronic: Secondary | ICD-10-CM

## 2011-03-21 ENCOUNTER — Telehealth: Payer: Self-pay | Admitting: Family Medicine

## 2011-03-21 ENCOUNTER — Other Ambulatory Visit: Payer: Self-pay | Admitting: Family Medicine

## 2011-03-21 MED ORDER — ALBUTEROL SULFATE HFA 108 (90 BASE) MCG/ACT IN AERS: 2.0000 | INHALATION_SPRAY | Freq: Four times a day (QID) | RESPIRATORY_TRACT | Status: DC | PRN

## 2011-03-21 NOTE — Telephone Encounter (Signed)
proventil has been approved. Patient notified and MD sent in electronically today.

## 2011-03-21 NOTE — Telephone Encounter (Signed)
Approval received  for proventil. Pharmacy notified.

## 2011-03-21 NOTE — Telephone Encounter (Signed)
Called pt to review lab results.  All WNL.

## 2011-04-02 ENCOUNTER — Telehealth: Payer: Self-pay | Admitting: Family Medicine

## 2011-04-02 NOTE — Telephone Encounter (Signed)
Printed and faxed

## 2011-04-02 NOTE — Telephone Encounter (Signed)
Dr. Laury Axon the most recent labs for this patient faxed to [phone].

## 2011-04-24 ENCOUNTER — Other Ambulatory Visit: Payer: Self-pay | Admitting: Family Medicine

## 2011-04-24 MED ORDER — OMEPRAZOLE 20 MG PO CPDR: 20.0000 mg | DELAYED_RELEASE_CAPSULE | Freq: Every day | ORAL | Status: DC

## 2011-05-01 HISTORY — PX: NM MYOCAR PERF WALL MOTION: HXRAD629

## 2011-05-30 ENCOUNTER — Encounter: Payer: Self-pay | Admitting: Family Medicine

## 2011-05-30 ENCOUNTER — Ambulatory Visit (INDEPENDENT_AMBULATORY_CARE_PROVIDER_SITE_OTHER): Payer: PRIVATE HEALTH INSURANCE | Admitting: Family Medicine

## 2011-05-30 VITALS — BP 110/67 | HR 69 | Temp 97.6°F | Ht 67.0 in | Wt 169.0 lb

## 2011-05-30 DIAGNOSIS — N6320 Unspecified lump in the left breast, unspecified quadrant: Secondary | ICD-10-CM

## 2011-05-30 DIAGNOSIS — Z7901 Long term (current) use of anticoagulants: Secondary | ICD-10-CM

## 2011-05-30 DIAGNOSIS — I4891 Unspecified atrial fibrillation: Secondary | ICD-10-CM

## 2011-05-30 DIAGNOSIS — N63 Unspecified lump in unspecified breast: Secondary | ICD-10-CM

## 2011-05-30 DIAGNOSIS — E119 Type 2 diabetes mellitus without complications: Secondary | ICD-10-CM

## 2011-05-30 NOTE — Progress Notes (Signed)
  Subjective:    Patient ID: [name], female    DOB: October 27, 1927, 77 y.o.   MRN: [id number]  HPI Lump left breast: Patient reports feeling a lump in the left lateral breast a few days ago. States she has not felt it since. Would like to have a breast exam done on the left breast-to examine for any problem. No nipple discharge. No rash or skin changes.  Blood thinners: Patient has a history of A. fib. Had weighed risk and benefit of blood thinners at last appointment. Was referred to cardiology for further discussion. Cardiology decided to put patient on Coumadin. Going to [facility] for her Coumadin checks. Patient's cardiologist is Dr. Cyndie Chime.  Diabetes: Patient states that she does not check her blood sugars regularly. Last A1c 6.8--well controlled. Occasionally feels shaky like she's having a low blood sugar and will eat a snack. Does not check her blood sugar so unsure if a true low.      Review of Systems As per above. No chest pain. His palpitations. No falls. No dizziness.    Objective:   Physical Exam  Constitutional: She is oriented to person, place, and time. She appears well-developed and well-nourished.  HENT:  Head: Normocephalic and atraumatic.  Cardiovascular: Normal rate and normal heart sounds.   No murmur heard.      Irregular rhythm  Pulmonary/Chest: Effort normal. No respiratory distress. She has no wheezes. She has no rales.       Left breast exam: Thickness present on palpation of breast tissue at base of breast at 6:00 position.  No tenderness. No nodules.  No skin changes.    Right breast- WNL.   Abdominal: Soft. She exhibits no distension.  Musculoskeletal: She exhibits no edema.  Neurological: She is alert and oriented to person, place, and time.  Skin: Skin is warm.  Psychiatric: She has a normal mood and affect. Her behavior is normal.          Assessment & Plan:

## 2011-05-30 NOTE — Patient Instructions (Addendum)
We will call you with your appointment for your mammogram.   Please schedule a follow up appointment for me to recheck your husbands blood pressure.  Return for follow up in 2-3 months or sooner if needed.

## 2011-06-02 ENCOUNTER — Telehealth: Payer: Self-pay | Admitting: Family Medicine

## 2011-06-02 NOTE — Telephone Encounter (Signed)
Need referral to an audiologist and mammogram.  Please call patient when these have been arranged.  Would like to have them both in December.

## 2011-06-04 ENCOUNTER — Telehealth: Payer: Self-pay | Admitting: Family Medicine

## 2011-06-04 NOTE — Telephone Encounter (Signed)
Called x 2.  Left message. Told patient this past will call with her appointment time for her test. Also told her regarding the audiology appointment, although we didn't discuss this issue at  Her last appointment-typically patient's do not need a referral. If they need a form signed by the doctor they can fax that to me.

## 2011-06-04 NOTE — Telephone Encounter (Signed)
Pt advised of Diagnostic Mammogram for 12/6at 1:15 at [facility]. Order faxed to [id number].

## 2011-06-05 ENCOUNTER — Telehealth: Payer: Self-pay | Admitting: Family Medicine

## 2011-06-05 NOTE — Telephone Encounter (Signed)
Pt has set up appt for her hearing test at [facility] audio on Dec 21 - they told her that she needs a referral from [place]. Their fax # is [phone]

## 2011-06-05 NOTE — Telephone Encounter (Signed)
Fwd. To PCP for referral .Arlyss Repress

## 2011-06-09 ENCOUNTER — Encounter: Payer: Self-pay | Admitting: Family Medicine

## 2011-06-09 DIAGNOSIS — Z7901 Long term (current) use of anticoagulants: Secondary | ICD-10-CM | POA: Insufficient documentation

## 2011-06-09 DIAGNOSIS — N6320 Unspecified lump in the left breast, unspecified quadrant: Secondary | ICD-10-CM | POA: Insufficient documentation

## 2011-06-09 NOTE — Assessment & Plan Note (Signed)
Managed by cardiologist- coumadin for a.fib.

## 2011-06-09 NOTE — Assessment & Plan Note (Signed)
instructed pt to stop glucotrol since a1c well controlled.  Will recheck A1c in 3 months and determin if glucotrol should be restarted.  Risk of hypoglycemia is too high in this patient.  Goal A1C is <7.5.

## 2011-06-09 NOTE — Telephone Encounter (Signed)
Referral letter drafted will ask administrative staff to please fax to number below.

## 2011-06-09 NOTE — Telephone Encounter (Signed)
Spoke to pt on phone.  Pt states that she has had some problems with left ear hearing.  Has not had this evaluated here at my office.  Would prefer to go to audiologist.  Agree to send referral as pt has requested.  Also reviewed results of mammogram with pt- all results negative.

## 2011-06-09 NOTE — Assessment & Plan Note (Signed)
At last cardiology appt started on coumadin for a fib.

## 2011-06-09 NOTE — Assessment & Plan Note (Signed)
Thickness present at bottom/center of left breast.  Most likely is normal breast tissue but will obtain mammogram to look for other etiology.

## 2011-06-13 ENCOUNTER — Telehealth: Payer: Self-pay | Admitting: Family Medicine

## 2011-06-13 ENCOUNTER — Other Ambulatory Visit: Payer: Self-pay | Admitting: Family Medicine

## 2011-06-13 MED ORDER — GLIPIZIDE 5 MG PO TABS: 5.0000 mg | ORAL_TABLET | Freq: Every day | ORAL | Status: DC

## 2011-06-13 NOTE — Telephone Encounter (Signed)
Need to let you know that her blood sugar was 286 around noon yesterday before she ate.  After eating it was 491.  This morning she took it and it was 227 before eating.   Also wanted to let you know that Mr. Nayak blood sugar was 159 before eating.  Want to know for him what it should be.  Could you call her back when you get a chance.

## 2011-06-13 NOTE — Telephone Encounter (Signed)
Spoke with patient.  We decided to restart glipizide 5mg  po daily

## 2011-06-27 ENCOUNTER — Encounter: Payer: Self-pay | Admitting: Family Medicine

## 2011-07-02 ENCOUNTER — Other Ambulatory Visit: Payer: Self-pay | Admitting: Family Medicine

## 2011-07-02 ENCOUNTER — Telehealth: Payer: Self-pay | Admitting: Family Medicine

## 2011-07-02 MED ORDER — GLIPIZIDE 5 MG PO TABS: 5.0000 mg | ORAL_TABLET | Freq: Every day | ORAL | Status: DC

## 2011-07-02 NOTE — Telephone Encounter (Signed)
Please let her know that I will fax in refills today.  Please tell her to schedule an appointment to see me tomorrow (you can give her one of my same day appointments) if she the "rattle" isn't improving today or if new or worsening of symptoms.

## 2011-07-02 NOTE — Telephone Encounter (Signed)
Would like for you to call in her and Mr. Neuharth meds to [facility] [phone]  For her Glipizie 65m (order # is [id number])NFA Mr. Fulfer Lisinopril 40mg  and Hctz 25mg  (order # [id number]).  Also went to see her heart doctor and they heard some rattling in chest which she attributes to being bronchitis.  Want to know if she will need to have an xray for that.

## 2011-07-22 ENCOUNTER — Encounter: Payer: Self-pay | Admitting: Family Medicine

## 2011-07-22 ENCOUNTER — Ambulatory Visit (INDEPENDENT_AMBULATORY_CARE_PROVIDER_SITE_OTHER): Payer: PRIVATE HEALTH INSURANCE | Admitting: Family Medicine

## 2011-07-22 VITALS — BP 143/73 | HR 78 | Temp 97.8°F | Ht 67.0 in | Wt 170.0 lb

## 2011-07-22 DIAGNOSIS — J069 Acute upper respiratory infection, unspecified: Secondary | ICD-10-CM

## 2011-07-22 MED ORDER — BENZONATATE 100 MG PO CAPS: 100.0000 mg | ORAL_CAPSULE | Freq: Two times a day (BID) | ORAL | Status: AC | PRN

## 2011-07-22 NOTE — Progress Notes (Signed)
  Subjective:    Patient ID: [name], female    DOB: [birth year], 78 y.o.   MRN: [id number]  HPI 78 yo here for work in appt for 1 week of cough  Subjective fever and chills with fatigue.  Rhinorrhea.  No dyspnea.   States she was in the hospital for bronchitis several months ago. No records found in our system.  She is additionally worried because she states her cardiologist heard crackles in her lungs in the past few weeks.    States got antibiotics from ENT for otitis media in the past several weeks  I have reviewed patient's  PMH, FH, and Social history and Medications as related to this visit. Has past visits for cough, self resolving, with great deal of anxiety over aging, also evident today.    Review of Systemssee above     Objective:   Physical Exam  GEN: Alert & Oriented, No acute distress HEENT: [place]/AT. EOMI, PERRLA, no conjunctival injection or scleral icterus.  Bilateral tympanic membranes intact without erythema or effusion.  Left ear canal with healing excoriations.   Nares without edema or rhinorrhea.  Oropharynx is without erythema or exudates.  No anterior or posterior cervical lymphadenopathy. CV:  Regular Rate & Rhythm, no murmur Respiratory:  Normal work of breathing, CTAB Abd:  + BS, soft, no tenderness to palpation        Assessment & Plan:

## 2011-07-22 NOTE — Patient Instructions (Addendum)
Tessalon for cough  Your lungs sounds really good and your oxygen level is good today as well  Follow-up with your primary doctor

## 2011-07-22 NOTE — Assessment & Plan Note (Signed)
Likely postviral cough.  No evidence of sinusitis or bronchitis.  Reassured patient and gave red flags for return.  Rxed tessalon for cough.

## 2011-07-25 ENCOUNTER — Ambulatory Visit: Payer: PRIVATE HEALTH INSURANCE | Admitting: Family Medicine

## 2011-07-28 ENCOUNTER — Telehealth: Payer: Self-pay | Admitting: *Deleted

## 2011-07-28 NOTE — Telephone Encounter (Signed)
Patient notified

## 2011-07-28 NOTE — Telephone Encounter (Signed)
Patient calls reporting she has continued cough, generally feels bad. Doesn't want to get out out of bed. Tessalon not helping cough. Denies fever.  Not sure if she has congestion in chest.  Appointment scheduled for tomorrow with PCP.    wants to know if she can take Mucinex.

## 2011-07-28 NOTE — Telephone Encounter (Signed)
Ok to take mucinex

## 2011-07-29 ENCOUNTER — Encounter: Payer: Self-pay | Admitting: Family Medicine

## 2011-07-29 ENCOUNTER — Ambulatory Visit (INDEPENDENT_AMBULATORY_CARE_PROVIDER_SITE_OTHER): Payer: PRIVATE HEALTH INSURANCE | Admitting: Family Medicine

## 2011-07-29 VITALS — BP 114/67 | HR 83 | Temp 98.5°F | Ht 67.0 in | Wt 171.0 lb

## 2011-07-29 DIAGNOSIS — I10 Essential (primary) hypertension: Secondary | ICD-10-CM

## 2011-07-29 DIAGNOSIS — J069 Acute upper respiratory infection, unspecified: Secondary | ICD-10-CM

## 2011-07-29 DIAGNOSIS — E119 Type 2 diabetes mellitus without complications: Secondary | ICD-10-CM

## 2011-07-29 LAB — POCT GLYCOSYLATED HEMOGLOBIN (HGB A1C): Hemoglobin A1C: 6.6

## 2011-07-29 NOTE — Patient Instructions (Signed)
Diabetes: Great job with always checking your blood sugar when you feel low.  Remember a low blood sugar is less than 70. A high is greater than 200.   Rules to checking blood sugar: Check either first thing in the morning, before you eat, or 2 hours after you eat.  Write down your blood sugars in book.  Cough: Use albuterol inhaler every 4-6 hours for the next 24 hours, then just as needed. Symptoms that would mean you should come back:   Worsening symptoms, Nausea, Vomiting, Fever >100.4, or new or worsening.  Tylenol as needed for your body aches.  Continue to take cough medicine as directed.  You can try to take mucinex.

## 2011-07-30 NOTE — Assessment & Plan Note (Signed)
No red flags on exam. Patient to try Mucinex to see if this improves cough. Patient also encouraged to use albuterol frequently -every 4-6 hours. X24 hours. then as needed. This is to help with the wheezing. Patient given red flags for return. Patient states understanding.

## 2011-07-30 NOTE — Assessment & Plan Note (Signed)
Continue current regimen. Blood pressure well controlled.

## 2011-07-30 NOTE — Progress Notes (Signed)
  Subjective:    Patient ID: [name], female    DOB: [birth year], 78 y.o.   MRN: [id number]  HPI Diabetes: Patient told to stop Glucotrol at last visit. This was do to good A1c and some symptoms of hypoglycemia. Patient went home and had some elevations in called requesting to restart. Patient restart her Glucotrol until the past week which began to have symptoms of low blood sugars again. Readings usually were in the 90s. Patient stopped because she did not like the feeling of being low. Would like to know if she should stop this. Wants A1c rechecked today. Did not bring blood pressure log book.  Cough: X1 week. Positive runny nose. Occasional sore throat. Worsened night cannot sleep. No fever. No nausea vomiting. No headaches. Cough medicine Tessalon Perles not helping. No wheezing. Seen on 1/21. Diagnosed with viral illness. Patient states that she is slowly improving. No hemoptysis. No rib pain.  Blood pressure: 114/67 today. Patient states she's taking all medications as directed. No headaches. No dizziness. No syncope. No blurry vision.   Review of Systems As per above.    Objective:   Physical Exam  Constitutional: She appears well-developed and well-nourished.  HENT:  Head: Normocephalic and atraumatic.  Neck: Normal range of motion. Neck supple.  Cardiovascular: Normal rate.   Pulmonary/Chest: Effort normal. No respiratory distress.       Scattered wheezing bilateral in upper lobes. Fine crackles in bases bilateral.  Abdominal: Soft.  Musculoskeletal: She exhibits no edema.  Skin: No rash noted.          Assessment & Plan:

## 2011-07-30 NOTE — Assessment & Plan Note (Addendum)
Patient A1c 6.6.  Patient instructed to stop Glucotrol at this time. We'll recheck A1c in 3 months. Patient given instructions on how to check blood sugar-appropriate timing. Patient to continue healthy diet.

## 2011-07-31 ENCOUNTER — Other Ambulatory Visit (HOSPITAL_COMMUNITY): Payer: Self-pay | Admitting: Internal Medicine

## 2011-08-01 ENCOUNTER — Encounter (HOSPITAL_COMMUNITY): Payer: Self-pay | Admitting: Respiratory Therapy

## 2011-08-01 MED ORDER — SODIUM CHLORIDE 0.9 % IV SOLN
INTRAVENOUS | Status: DC
  Administered 2013-05-12 (×2): via INTRAVENOUS

## 2011-08-01 MED ORDER — SODIUM CHLORIDE 0.9 % IJ SOLN: 3.0000 mL | Freq: Two times a day (BID) | INTRAMUSCULAR | Status: DC

## 2011-08-05 ENCOUNTER — Other Ambulatory Visit: Payer: Self-pay | Admitting: Internal Medicine

## 2011-08-05 ENCOUNTER — Ambulatory Visit
Admission: RE | Admit: 2011-08-05 | Discharge: 2011-08-05 | Disposition: A | Discharge status: Home / Self Care | Payer: PRIVATE HEALTH INSURANCE | Source: Ambulatory Visit | Attending: Internal Medicine | Admitting: Internal Medicine

## 2011-08-05 DIAGNOSIS — R0600 Dyspnea, unspecified: Secondary | ICD-10-CM

## 2011-08-05 DIAGNOSIS — J449 Chronic obstructive pulmonary disease, unspecified: Secondary | ICD-10-CM

## 2011-08-06 ENCOUNTER — Ambulatory Visit: Payer: PRIVATE HEALTH INSURANCE | Admitting: Family Medicine

## 2011-08-07 ENCOUNTER — Ambulatory Visit: Payer: PRIVATE HEALTH INSURANCE | Admitting: Family Medicine

## 2011-08-11 ENCOUNTER — Other Ambulatory Visit: Payer: Self-pay | Admitting: Internal Medicine

## 2011-08-12 ENCOUNTER — Encounter (HOSPITAL_COMMUNITY)
Admission: RE | Disposition: A | Discharge status: Home / Self Care | Payer: Self-pay | Source: Ambulatory Visit | Attending: Internal Medicine

## 2011-08-12 ENCOUNTER — Ambulatory Visit (HOSPITAL_COMMUNITY)
Admission: RE | Admit: 2011-08-12 | Discharge: 2011-08-12 | Disposition: A | Discharge status: Home / Self Care | Payer: PRIVATE HEALTH INSURANCE | Source: Ambulatory Visit | Attending: Internal Medicine | Admitting: Internal Medicine

## 2011-08-12 DIAGNOSIS — J4489 Other specified chronic obstructive pulmonary disease: Secondary | ICD-10-CM | POA: Insufficient documentation

## 2011-08-12 DIAGNOSIS — R0989 Other specified symptoms and signs involving the circulatory and respiratory systems: Secondary | ICD-10-CM | POA: Insufficient documentation

## 2011-08-12 DIAGNOSIS — I2510 Atherosclerotic heart disease of native coronary artery without angina pectoris: Secondary | ICD-10-CM | POA: Insufficient documentation

## 2011-08-12 DIAGNOSIS — R0609 Other forms of dyspnea: Secondary | ICD-10-CM | POA: Insufficient documentation

## 2011-08-12 DIAGNOSIS — J449 Chronic obstructive pulmonary disease, unspecified: Secondary | ICD-10-CM | POA: Insufficient documentation

## 2011-08-12 DIAGNOSIS — I447 Left bundle-branch block, unspecified: Secondary | ICD-10-CM | POA: Insufficient documentation

## 2011-08-12 DIAGNOSIS — I10 Essential (primary) hypertension: Secondary | ICD-10-CM | POA: Insufficient documentation

## 2011-08-12 DIAGNOSIS — I428 Other cardiomyopathies: Secondary | ICD-10-CM | POA: Insufficient documentation

## 2011-08-12 DIAGNOSIS — I2584 Coronary atherosclerosis due to calcified coronary lesion: Secondary | ICD-10-CM | POA: Insufficient documentation

## 2011-08-12 DIAGNOSIS — E785 Hyperlipidemia, unspecified: Secondary | ICD-10-CM | POA: Insufficient documentation

## 2011-08-12 DIAGNOSIS — E119 Type 2 diabetes mellitus without complications: Secondary | ICD-10-CM | POA: Insufficient documentation

## 2011-08-12 DIAGNOSIS — I4891 Unspecified atrial fibrillation: Secondary | ICD-10-CM | POA: Insufficient documentation

## 2011-08-12 HISTORY — PX: LEFT HEART CATHETERIZATION WITH CORONARY ANGIOGRAM: SHX5451

## 2011-08-12 HISTORY — PX: CARDIAC CATHETERIZATION: SHX172

## 2011-08-12 LAB — PROTIME-INR
INR: 1.23 (ref 0.00–1.49)
Prothrombin Time: 15.8 seconds — ABNORMAL HIGH (ref 11.6–15.2)

## 2011-08-12 SURGERY — LEFT HEART CATHETERIZATION WITH CORONARY ANGIOGRAM
Anesthesia: LOCAL

## 2011-08-12 MED ORDER — MIDAZOLAM HCL 2 MG/2ML IJ SOLN
INTRAMUSCULAR | Status: AC
  Filled 2011-08-12: qty 2

## 2011-08-12 MED ORDER — FENTANYL CITRATE 0.05 MG/ML IJ SOLN
INTRAMUSCULAR | Status: AC
  Filled 2011-08-12: qty 2

## 2011-08-12 MED ORDER — SODIUM CHLORIDE 0.9 % IV SOLN
INTRAVENOUS | Status: DC
  Administered 2011-08-12: 08:00:00 via INTRAVENOUS

## 2011-08-12 MED ORDER — HEPARIN (PORCINE) IN NACL 2-0.9 UNIT/ML-% IJ SOLN
INTRAMUSCULAR | Status: AC
  Filled 2011-08-12: qty 2000

## 2011-08-12 MED ORDER — ASPIRIN 81 MG PO CHEW
CHEWABLE_TABLET | ORAL | Status: AC
  Filled 2011-08-12: qty 4

## 2011-08-12 MED ORDER — SODIUM CHLORIDE 0.9 % IV SOLN: 1.0000 mL/kg/h | INTRAVENOUS | Status: AC

## 2011-08-12 MED ORDER — ACETAMINOPHEN 325 MG PO TABS: 650.0000 mg | ORAL_TABLET | ORAL | Status: DC | PRN

## 2011-08-12 MED ORDER — SODIUM CHLORIDE 0.9 % IJ SOLN: 3.0000 mL | INTRAMUSCULAR | Status: DC | PRN

## 2011-08-12 MED ORDER — SODIUM CHLORIDE 0.9 % IV SOLN: INTRAVENOUS | Status: DC

## 2011-08-12 MED ORDER — NITROGLYCERIN 0.2 MG/ML ON CALL CATH LAB
INTRAVENOUS | Status: AC
  Filled 2011-08-12: qty 1

## 2011-08-12 MED ORDER — LIDOCAINE HCL (PF) 1 % IJ SOLN
INTRAMUSCULAR | Status: AC
  Filled 2011-08-12: qty 30

## 2011-08-12 MED ORDER — DIAZEPAM 5 MG PO TABS
5.0000 mg | ORAL_TABLET | ORAL | Status: AC
  Administered 2011-08-12: 5 mg via ORAL
  Filled 2011-08-12: qty 1

## 2011-08-12 MED ORDER — ONDANSETRON HCL 4 MG/2ML IJ SOLN: 4.0000 mg | Freq: Four times a day (QID) | INTRAMUSCULAR | Status: DC | PRN

## 2011-08-12 MED ORDER — HEPARIN SODIUM (PORCINE) 1000 UNIT/ML IJ SOLN
INTRAMUSCULAR | Status: AC
  Filled 2011-08-12: qty 1

## 2011-08-12 MED ORDER — VERAPAMIL HCL 2.5 MG/ML IV SOLN
INTRAVENOUS | Status: AC
  Filled 2011-08-12: qty 2

## 2011-08-12 NOTE — Discharge Instructions (Signed)
Radial Site Care Refer to this sheet in the next few weeks. These instructions provide you with information on caring for yourself after your procedure. Your caregiver may also give you more specific instructions. Your treatment has been planned according to current medical practices, but problems sometimes occur. Call your caregiver if you have any problems or questions after your procedure. HOME CARE INSTRUCTIONS  You may shower the day after the procedure.Remove the bandage (dressing) and gently wash the site with plain soap and water.Gently pat the site dry.   Do not apply powder or lotion to the site.   Do not submerge the affected site in water for 3 to 5 days.   Inspect the site at least twice daily.   Do not flex or bend the affected arm for 24 hours.   No lifting over 5 pounds (2.3 kg) for 5 days after your procedure.   Do not drive home if you are discharged the same day of the procedure. Have someone else drive you.   You may drive 24 hours after the procedure unless otherwise instructed by your caregiver.  What to expect:  Any bruising will usually fade within 1 to 2 weeks.   Blood that collects in the tissue (hematoma) may be painful to the touch. It should usually decrease in size and tenderness within 1 to 2 weeks.  SEEK IMMEDIATE MEDICAL CARE IF:  You have unusual pain at the radial site.   You have redness, warmth, swelling, or pain at the radial site.   You have drainage (other than a small amount of blood on the dressing).   You have chills.   You have a fever or persistent symptoms for more than 72 hours.   You have a fever and your symptoms suddenly get worse.   Your arm becomes pale, cool, tingly, or numb.   You have heavy bleeding from the site. Hold pressure on the site.  Document Released: 07/19/2010 Document Revised: 02/26/2011 Document Reviewed: 07/19/2010 [employer] Patient Information 2012 [employer]. 

## 2011-08-12 NOTE — Progress Notes (Signed)
Continuous pulse ox attached to right thumb

## 2011-08-12 NOTE — Op Note (Signed)
[facility]     CARDIAC CATHETERIZATION REPORT  [name]   [id number] [birth year]  Performing Cardiologist: Chrystie Nose Primary Physician: Ellin Mayhew, MD, MD Primary Cardiologist: Dr. Rennis Golden  Procedures Performed:  Left Heart Catheterization via 5 Fr right radial artery access  Native Coronary Angiography  Indication(s): Dyspnea, cardiomyopathy  History: 78 y.o. female with a history of atrial fibrillation and left bundle branch block, hypertension, dyslipidemia, diabetes, COPD and chronic bronchitis, who recently underwent stress testing in the office for shortness of breath and was not found to have any reversible ischemia by nuclear stress testing. Due to her atrial fibrillation the study was on non-gated, and an echocardiogram was subsequently performed demonstrating an EF of 40-45%.  Given her cardiomyopathy, persistent dyspnea on exertion, and left bundle branch block, a cardiac catheterization was scheduled to be performed to definitively rule out coronary disease as a cause of her left bundle or reduced ejection fraction.  Consent: The procedure with Risks/Benefits/Alternatives and Indications was reviewed with the patient and family.  All questions were answered.    Risks / Complications include, but not limited to: Death, MI, CVA/TIA, VF/VT (with defibrillation), Bradycardia (need for temporary pacer placement), contrast induced nephropathy, bleeding / bruising / hematoma / pseudoaneurysm, vascular or coronary injury (with possible emergent CT or Vascular Surgery), adverse medication reactions, infection.    The patient voices understanding and agree to proceed.    Risks of procedure as well as the alternatives and risks of each were explained to the (patient/caregiver).  Consent for procedure obtained. Consent for signed by MD and patient with RN witness -- placed on chart.  Procedure: The patient was brought to the 2nd Floor [place]  Cardiac Catheterization Lab in the fasting state and prepped and draped in the usual sterile fashion for (Right radial) access.  A modified Allen's test with plethysmography was performed on the right wrist demonstrating adequate Ulnar Artery collateral flow.    Sterile technique was used including antiseptics, cap, gloves, gown, hand hygiene, mask and sheet.  Skin prep: Chlorhexidine;  Time Out: Verified patient identification, verified procedure, site/side was marked, verified correct patient position, special equipment/implants available, medications/allergies/relevent history reviewed, required imaging and test results available.  Performed  The right wrist was anesthetized with 1% subcutaneous Lidocaine.  The right radial artery was accessed using the Seldinger Technique with placement of a 5 Fr Glide Sheath. The sheath was aspirated and flushed.  Then a total of 10 ml of standard Radial Artery Cocktail (see medications) was infused.    A 5 Fr TIG 4.0 Catheter was advanced of over a Safety J wire into the ascending Aorta.  The catheter inadvertently crossed the aortic valve and an LV end diastolic pressure as well as pullback gradient was obtained.  The catheter was used to engage the left coronary artery, however it sat very deep in the left main and subselective the LAD. The catheter was then exchanged for a JL 3.5.  Multiple cineangiographic views of the left coronary artery system(s) were performed. A 5 Fr JR 4 Catheter was advanced of over a Lexicographer J) wire into the ascending Aorta.  The catheter was used to engage the right coronary artery.  Multiple cineangiographic views of the right coronary artery system(s) were performed. The catheter  And the wire was removed completely out of the body.  The sheath was removed in the Cath Lab with a TR band placed at 12 ml Air at 0945.  Reverse  Allen's test revealed non-occlusive hemostasis.  The patient was transported to the holding area  in stable condition.   The patient  was stable before, during and following the procedure.   Patient did tolerate procedure well. There were not complications.  EBL: 10 cc  Medications:  Premedication: 5 mg  Valium  Sedation:  1 mg IV Versed, 25 IV mcg Fentanyl  Contrast:  55 cc Omnipaque  4000 units heparin, 10 cc radial cocktail  Hemodynamics:  Central Aortic Pressure / Mean Aortic Pressure: 96/49  LV Pressure / LV End diastolic Pressure:  8  Coronary Angiographic Data:  Left Main:  Heavily calcified, but no significant stenosis  Left Anterior Descending (LAD):  Large vessel with mid vessel bridging, however no significant stenosis. The vessel does extend to the apex.  1st diagonal (D1):  Large diagonal branch without significant stenosis  Circumflex (LCx):  Dominant large-caliber vessel without significant stenosis  1st obtuse marginal:  Large caliber branch without significant stenosis  2nd obtuse marginal: Smaller caliber without significant stenosis  Posterior descending artery:  Apparent from the circumflex without significant stenosis  Right Coronary Artery: Smaller, nondominant vessel without significant stenosis  Posterior lateral branch: No significant stenosis  Impression: 1.  Calcified coronary artery artery is of large caliber without significant stenosis. 2.  Nonischemic cardiomyopathy, secondary to atrial fibrillation and dyssynchrony due to left bundle branch block.  Plan: 1.  Medical management of nonischemic cardiomyopathy.  The case and results was discussed with the patient and family. The case and results was not discussed with the patient's PCP. The case and results was discussed with the patient's Cardiologist.  Time Spend Directly with Patient:  45 minutes  Chrystie Nose, MD Attending Cardiologist [facility]  Tyrann Donaho C 08/12/2011, 9:58 AM

## 2011-08-12 NOTE — H&P (Signed)
.     [facility]          INTERVAL PROCEDURE H&P   History and Physical Interval Note:  08/12/2011 8:11 AM  [name] has presented today for their planned procedure. The various methods of treatment have been discussed with the patient and family. After consideration of risks, benefits and other options for treatment, the patient has consented to the procedure.  The patients' outpatient history has been reviewed, patient examined, and no change in status from most recent office note within the past 30 days. I have reviewed the patients' chart and labs and will proceed as planned. Questions were answered to the patient's satisfaction.   Nancy Nose, MD Attending Cardiologist [facility]  [name] 08/12/2011, 8:11 AM

## 2011-09-01 ENCOUNTER — Telehealth: Payer: Self-pay | Admitting: Family Medicine

## 2011-09-01 NOTE — Telephone Encounter (Signed)
[name] wanted to let you know that she is back on her glucotrol because her b/s was over 200.  Wanted to let you know.

## 2011-09-01 NOTE — Telephone Encounter (Signed)
Thanks Britta Mccreedy, will discuss in more detail with patient at her follow up appointment.

## 2011-09-03 ENCOUNTER — Ambulatory Visit (INDEPENDENT_AMBULATORY_CARE_PROVIDER_SITE_OTHER): Payer: PRIVATE HEALTH INSURANCE | Admitting: Family Medicine

## 2011-09-03 ENCOUNTER — Encounter: Payer: Self-pay | Admitting: Family Medicine

## 2011-09-03 DIAGNOSIS — J449 Chronic obstructive pulmonary disease, unspecified: Secondary | ICD-10-CM

## 2011-09-03 MED ORDER — MINOCYCLINE HCL 100 MG PO CAPS: 100.0000 mg | ORAL_CAPSULE | Freq: Two times a day (BID) | ORAL | Status: DC

## 2011-09-03 NOTE — Patient Instructions (Signed)
Take minocycline as directed.   Return if cough doesn't improve within 1 week.

## 2011-09-03 NOTE — Progress Notes (Signed)
  Subjective:    Patient ID: [name], female    DOB: 07/18/1927, 78 y.o.   MRN: [id number]  HPI Cough since late January: Patient reports that she has had a cough since her last appointment with me on January 30. Patient states that she has been using albuterol inhaler to help with wheezing and shortness of breath. Uses from 1-3 times per day. States that she also has had increased mucus production off and on. Also has had swelling off and on her ankles. Patient denies fever. No runny nose. No sore throat. No ear pain. No other cold symptoms.  No chest pain. Had cardiac cath in February that was within normal limits per patient. Has had some blood glucose elevations in the 200s and 300s during the past month. Restarted her sulfonylurea.    Review of Systems As per above.    Objective:   Physical Exam  Constitutional: She appears well-developed and well-nourished.  HENT:  Head: Normocephalic and atraumatic.  Nose: Nose normal.  Mouth/Throat: Oropharynx is clear and moist.  Eyes: Conjunctivae are normal. Pupils are equal, round, and reactive to light. Right eye exhibits no discharge. Left eye exhibits no discharge.  Neck: Neck supple.  Cardiovascular: Normal rate and normal heart sounds.   No murmur heard.      Irregular rhythm  Pulmonary/Chest: Effort normal. No respiratory distress. She has wheezes (on expiration bilateral). She has no rales. She exhibits no tenderness.  Abdominal: Soft. She exhibits no distension. There is no tenderness. There is no rebound and no guarding.  Musculoskeletal: She exhibits no edema.  Lymphadenopathy:    She has no cervical adenopathy.  Neurological: She is alert.  Skin: No rash noted.          Assessment & Plan:

## 2011-09-07 ENCOUNTER — Telehealth: Payer: Self-pay | Admitting: Family Medicine

## 2011-09-07 ENCOUNTER — Emergency Department (HOSPITAL_COMMUNITY)
Admission: EM | Admit: 2011-09-07 | Discharge: 2011-09-07 | Disposition: A | Discharge status: Home / Self Care | Payer: PRIVATE HEALTH INSURANCE | Attending: Emergency Medicine | Admitting: Emergency Medicine

## 2011-09-07 ENCOUNTER — Encounter (HOSPITAL_COMMUNITY): Payer: Self-pay | Admitting: Emergency Medicine

## 2011-09-07 ENCOUNTER — Emergency Department (HOSPITAL_COMMUNITY): Payer: PRIVATE HEALTH INSURANCE

## 2011-09-07 DIAGNOSIS — K1379 Other lesions of oral mucosa: Secondary | ICD-10-CM

## 2011-09-07 DIAGNOSIS — Z7901 Long term (current) use of anticoagulants: Secondary | ICD-10-CM | POA: Insufficient documentation

## 2011-09-07 DIAGNOSIS — G609 Hereditary and idiopathic neuropathy, unspecified: Secondary | ICD-10-CM | POA: Insufficient documentation

## 2011-09-07 DIAGNOSIS — N189 Chronic kidney disease, unspecified: Secondary | ICD-10-CM | POA: Insufficient documentation

## 2011-09-07 DIAGNOSIS — K1370 Unspecified lesions of oral mucosa: Secondary | ICD-10-CM | POA: Insufficient documentation

## 2011-09-07 DIAGNOSIS — E119 Type 2 diabetes mellitus without complications: Secondary | ICD-10-CM | POA: Insufficient documentation

## 2011-09-07 DIAGNOSIS — E785 Hyperlipidemia, unspecified: Secondary | ICD-10-CM | POA: Insufficient documentation

## 2011-09-07 DIAGNOSIS — I509 Heart failure, unspecified: Secondary | ICD-10-CM | POA: Insufficient documentation

## 2011-09-07 DIAGNOSIS — I129 Hypertensive chronic kidney disease with stage 1 through stage 4 chronic kidney disease, or unspecified chronic kidney disease: Secondary | ICD-10-CM | POA: Insufficient documentation

## 2011-09-07 LAB — PROTIME-INR
INR: 2.24 — ABNORMAL HIGH (ref 0.00–1.49)
Prothrombin Time: 25.2 s — ABNORMAL HIGH (ref 11.6–15.2)

## 2011-09-07 LAB — DIFFERENTIAL
Basophils Absolute: 0.1 10*3/uL (ref 0.0–0.1)
Basophils Relative: 1 % (ref 0–1)
Eosinophils Absolute: 0.4 10*3/uL (ref 0.0–0.7)
Eosinophils Relative: 4 % (ref 0–5)
Lymphocytes Relative: 22 % (ref 12–46)
Lymphs Abs: 2.3 10*3/uL (ref 0.7–4.0)
Monocytes Absolute: 0.5 10*3/uL (ref 0.1–1.0)
Monocytes Relative: 5 % (ref 3–12)
Neutro Abs: 6.9 10*3/uL (ref 1.7–7.7)
Neutrophils Relative %: 68 % (ref 43–77)

## 2011-09-07 LAB — CBC
HCT: 38.1 % (ref 36.0–46.0)
Hemoglobin: 12.3 g/dL (ref 12.0–15.0)
MCH: 30.7 pg (ref 26.0–34.0)
MCHC: 32.3 g/dL (ref 30.0–36.0)
MCV: 95 fL (ref 78.0–100.0)
Platelets: 202 10*3/uL (ref 150–400)
RBC: 4.01 MIL/uL (ref 3.87–5.11)
RDW: 13.8 % (ref 11.5–15.5)
WBC: 10.1 10*3/uL (ref 4.0–10.5)

## 2011-09-07 LAB — TYPE AND SCREEN
ABO/RH(D): O NEG
Antibody Screen: NEGATIVE

## 2011-09-07 LAB — ABO/RH: ABO/RH(D): O NEG

## 2011-09-07 NOTE — Discharge Instructions (Signed)
Please hold your next dose of coumadin - call your doctor in the morning and call your dentist in the morning.  You should be seen by your dentist in the next 48 hours.  You were bleeding from behind your lower left molar - it has stopped but may start again - if this happens, put a Tea Bag on your tooth, bite gently and wait - if it doesn't stop after 30 minutes, return for reevaluation.

## 2011-09-07 NOTE — Telephone Encounter (Signed)
After reviewing patient chart in more detail- saw that ER doctor and on call dentist wanted dose of coumadin held today.  Called pt and let her know to NOT take coumadin today.  Pt to follow up with family medicine clinic on Monday.  Pt to call at 8:30 am for an appointment.

## 2011-09-07 NOTE — ED Notes (Signed)
Ambulatory to room from triage, steady gait, husband at side, NAD, calm, interactive.

## 2011-09-07 NOTE — ED Provider Notes (Signed)
History     CSN: [id number]  Arrival date & time 09/07/11  4098   First MD Initiated Contact with Patient 09/07/11 0330      Chief Complaint  Patient presents with  . Hemoptysis    (Consider location/radiation/quality/duration/timing/severity/associated sxs/prior treatment) HPI Comments: 78 year old female who is currently taking Coumadin because of a cough who presents with acute onset of hemoptysis this evening. This started approximately 2 hours prior to arrival. She states that she has not changed her Coumadin dose recently but has started an antibiotic recently because of her cough. The bleeding is persistent, mild, not associated with chest pain, dizziness, shortness of breath.  The history is provided by the patient.    Past Medical History  Diagnosis Date  . Anemia     anemia of renal disease  . Diabetes mellitus   . Hypertension   . Chronic kidney disease     Stage III- followed by [facility]  . CHF (congestive heart failure)     Diastolic dys-- EF 60% july 2011  . Hyperlipidemia   . Arthritis     mutiple joints  . Cancer     Renal neoplasm- survellience via Urology  . Peripheral neuropathy     Past Surgical History  Procedure Date  . Cholecystectomy     Family History  Problem Relation Age of Onset  . Heart disease Father     History  Substance Use Topics  . Smoking status: Passive Smoker  . Smokeless tobacco: Never Used  . Alcohol Use: No    OB History    Grav Para Term Preterm Abortions TAB SAB Ect Mult Living                  Review of Systems  All other systems reviewed and are negative.    Allergies  Review of patient's allergies indicates no known allergies.  Home Medications   Current Outpatient Rx  Name Route Sig Dispense Refill  . ALBUTEROL SULFATE HFA 108 (90 BASE) MCG/ACT IN AERS Inhalation Inhale 2 puffs into the lungs every 6 (six) hours as needed. For shortness of breath    . AMLODIPINE BESYLATE 10 MG PO TABS Oral  Take 10 mg by mouth daily.    . ASPIRIN 81 MG PO CHEW Oral Chew 81 mg by mouth daily.     . ATORVASTATIN CALCIUM 80 MG PO TABS Oral Take 80 mg by mouth at bedtime.     Marland Kitchen CALCIUM CARBONATE-VITAMIN D 600-200 MG-UNIT PO TABS Oral Take 1 tablet by mouth daily.     Marland Kitchen CARVEDILOL 6.25 MG PO TABS Oral Take 6.25 mg by mouth 2 (two) times daily with a meal.    . CINNAMON PO Oral Take 1 tablet by mouth daily.     Marland Kitchen GABAPENTIN 300 MG PO CAPS Oral Take 300 mg by mouth 3 (three) times daily.    Marland Kitchen MINOCYCLINE HCL 100 MG PO CAPS Oral Take 1 capsule (100 mg total) by mouth 2 (two) times daily. 14 capsule 0  . FISH OIL 1000 MG PO CAPS Oral Take 1 capsule by mouth 2 (two) times daily.     Marland Kitchen OMEPRAZOLE 20 MG PO CPDR Oral Take 20 mg by mouth daily as needed. For acid reflux    . WARFARIN SODIUM 5 MG PO TABS Oral Take 5-7.5 mg by mouth daily. Take 1.5 tablets (7.5mg ) on mondays. Take 1 tablet all other days of the week.      BP 139/61  Pulse  78  Temp(Src) 98.7 F (37.1 C) (Oral)  Resp 18  SpO2 99%  Physical Exam  Nursing note and vitals reviewed. Constitutional: She appears well-developed and well-nourished. No distress.  HENT:  Head: Normocephalic and atraumatic.  Nose: Nose normal.        bleeding coming from around the lower left rear molar. - There is no fracture tenderness or surrounding swelling or injury to the dentition or gingiva  Eyes: Conjunctivae and EOM are normal. Pupils are equal, round, and reactive to light. Right eye exhibits no discharge. Left eye exhibits no discharge. No scleral icterus.  Neck: Normal range of motion. Neck supple. No JVD present. No thyromegaly present.  Cardiovascular: Normal rate, regular rhythm, normal heart sounds and intact distal pulses.  Exam reveals no gallop and no friction rub.   No murmur heard. Pulmonary/Chest: Effort normal and breath sounds normal. No respiratory distress. She has no wheezes. She has no rales.  Abdominal: Soft. Bowel sounds are normal.  She exhibits no distension and no mass. There is no tenderness.  Musculoskeletal: Normal range of motion. She exhibits no edema and no tenderness.  Lymphadenopathy:    She has no cervical adenopathy.  Neurological: She is alert. Coordination normal.  Skin: Skin is warm and dry. No rash noted. No erythema.  Psychiatric: She has a normal mood and affect. Her behavior is normal.    ED Course  Procedures (including critical care time)  Labs Reviewed  PROTIME-INR - Abnormal; Notable for the following:    Prothrombin Time 25.2 (*)    INR 2.24 (*)    All other components within normal limits  CBC  DIFFERENTIAL  TYPE AND SCREEN  ABO/RH  COMPREHENSIVE METABOLIC PANEL  APTT   Dg Chest 2 View  09/07/2011  *RADIOLOGY REPORT*  Clinical Data: Hemoptysis  CHEST - 2 VIEW  Comparison: 08/05/2011  Findings: Stable heart size, upper normal limits to mildly enlarged.  Aortic atherosclerosis.  Mild interstitial prominence may represent senescent changes.  No focal areas of consolidation. No pleural effusion or pneumothorax. Osteopenia.  Multilevel degenerative change.  IMPRESSION: No focal consolidation.  Original Report Authenticated By: Waneta Martins, M.D.     1. Bleeding from mouth       MDM  Patient appears to have dental bleeding, I have had her swish and spit and visually observed for bleeding coming from the rear left lower molar.  We'll place teabag, check lab work, reevaluate, chest x-ray I suspect that this is related to starting an antibiotic which has changed her Coumadin levels, labs pending.  Patient reevaluated after teabag place, still have bleeding, lidocaine with epinephrine injected by myself, pressure held with gauze and another teabag with resolution of bleeding completely. Discussed with on-call dentist who agrees with care and agrees with holding next dose of Coumadin and follow up with family doctor and dentist. Patient in agreement, we'll discharge home  Lab work shows  no significant anemia    Vida Roller, MD 09/07/11 [phone]

## 2011-09-07 NOTE — ED Notes (Signed)
EDP at [facility] injecting area behind L lower molar with lidocaine with epi, biting on gauze, will continue to monitor. (pt has upper denture plate in place). Husband at [facility].

## 2011-09-07 NOTE — ED Notes (Signed)
Tea bag removed from pt's mouth.  No bleeding noted.

## 2011-09-07 NOTE — ED Notes (Signed)
Bleeding has stopped, biting on tea bag, report given to [employer], Charity fundraiser.

## 2011-09-07 NOTE — ED Notes (Addendum)
VSS/WNL, pt back to room/stretcher without change or incident, no changes, pt states, "bleeding has slowed down", appears to have lessened, biting on gauze at this time/continues.

## 2011-09-07 NOTE — ED Notes (Signed)
PT. WOKE UP AT 1 AM THIS MORNING WITH BLOODY SPUTUM , STATES TAKING COUMADIN , ADVISED BY PCP TO GO TO ER.

## 2011-09-07 NOTE — ED Notes (Signed)
Pt to xray via stretcher, holding bloody gauze (bright red), alert, NAD, calm, interactive, skin W&D, resps e/u, speaking in clear complete sentences, pt reports bloody sputum, light headedness and some nausea, (denies: sob, cough, congestion, weakness, dizziness, pain, "feeling cold" or other sx), family remains in room.

## 2011-09-07 NOTE — ED Notes (Signed)
No changes, pt calm, NAD, alert, airway intact, oozing continues despite tea bag, tea bag removed with some clots noted, gauze placed, biting on gauze at this time, EDP aware.

## 2011-09-07 NOTE — Telephone Encounter (Signed)
Pt wants to know if she should take today's dose of coumadin.  INR 2.24 in ER last night.  No active bleeding currently.  Told pt to take regular scheduled coumadin dose.  Pt states understanding.

## 2011-09-07 NOTE — ED Notes (Signed)
EDP at [facility] 

## 2011-09-07 NOTE — ED Notes (Addendum)
No changes, pt up to b/r, steady gait, NAD, calm, interactive, states, "still bleeding", will continue to monitor.

## 2011-09-07 NOTE — Telephone Encounter (Signed)
Pt calling emergency line.  States that she just had a dark colored stool and is afraid that this means that there is blood in it.  Pt seen in ER overnight for spitting up blood.  Pt states that the ER doctor found that the blood was from a tooth.  The bleeding stopped while in the ER but she did swallow some of the blood.  She states that the stool is dark colored.  No BRPBR.  No gross blood in toilet.  Discussed with pt that dark colored stool is most likely 2/2 her swallowing the blood.  The bleeding is now controlled.  Here Hbg was wnl in the ER.  Pt to monitor stools today and call for work in appointment at 8:30 am for either Monday or Tuesday.  Pt states understanding.  Reviewed red flags for return to er.

## 2011-09-08 NOTE — Assessment & Plan Note (Signed)
No focal finding on lung exam- diffuse wheezing bilateral.  No fever. No resp distress.  But increased mucous production and wheezing could indicated COPD exacerbation type picture.  Also using albuterol 1-3 x per day for approx  4 weeks.  Will treat with antibiotics at this time- minocycline  (doxy on back order).  Pt to f/up with me in next week.  If no improvement or if worsening could consider a steroid burst.  Reviewed red flags for return with patient.

## 2011-09-09 ENCOUNTER — Other Ambulatory Visit: Payer: Self-pay | Admitting: Family Medicine

## 2011-09-09 MED ORDER — AMLODIPINE BESYLATE 10 MG PO TABS: 10.0000 mg | ORAL_TABLET | Freq: Every day | ORAL | Status: DC

## 2011-09-09 MED ORDER — ALBUTEROL SULFATE HFA 108 (90 BASE) MCG/ACT IN AERS: 2.0000 | INHALATION_SPRAY | Freq: Four times a day (QID) | RESPIRATORY_TRACT | Status: DC | PRN

## 2011-09-09 MED ORDER — CARVEDILOL 6.25 MG PO TABS: 6.2500 mg | ORAL_TABLET | Freq: Two times a day (BID) | ORAL | Status: DC

## 2011-09-10 ENCOUNTER — Ambulatory Visit: Payer: PRIVATE HEALTH INSURANCE | Admitting: Family Medicine

## 2011-09-10 ENCOUNTER — Telehealth: Payer: Self-pay | Admitting: Family Medicine

## 2011-09-10 NOTE — Telephone Encounter (Signed)
Patient is calling wanting to speak to a nurse about a place on her lip that she got while in the ER the other night.  She had rubbed her mouth with some gauze and it started to bleed and today, there is a bump.

## 2011-09-10 NOTE — Telephone Encounter (Signed)
Pt states she figured out what it was-just a cold sore-will keep it clean and dry and let us know if sx worsen

## 2011-09-17 ENCOUNTER — Ambulatory Visit (INDEPENDENT_AMBULATORY_CARE_PROVIDER_SITE_OTHER): Payer: PRIVATE HEALTH INSURANCE | Admitting: Family Medicine

## 2011-09-17 ENCOUNTER — Encounter: Payer: Self-pay | Admitting: Family Medicine

## 2011-09-17 VITALS — BP 107/61 | HR 79 | Temp 98.3°F | Ht 67.0 in | Wt 169.0 lb

## 2011-09-17 DIAGNOSIS — B001 Herpesviral vesicular dermatitis: Secondary | ICD-10-CM

## 2011-09-17 DIAGNOSIS — B009 Herpesviral infection, unspecified: Secondary | ICD-10-CM

## 2011-09-17 NOTE — Progress Notes (Signed)
  Subjective:    Patient ID: [name], female    DOB: [birth year], 78 y.o.   MRN: [id number]  HPI Blister on upper lip:  patient reports blisters on her upper lip x1-1/2 weeks. States she has never had blisters on her mouth like this before. Has one small ulcer on the inside of her upper lip. No other ulcers or blisters on mouth. No history of cold sores or fever blisters. No trauma to the lip. Recent viral illness a few weeks ago. No known contact with someone with active herpes lesions.  Blisters have a white crust/scab on them. Slowly have been improving. They are not painful currently. No drainage. No fever.    Review of Systems  as per above.    Objective:   Physical Exam  Constitutional: She appears well-developed and well-nourished. No distress.  HENT:  Mouth/Throat: Oropharynx is clear and moist. No oropharyngeal exudate.       2 small- 3mm blisters/ulcers on upper left lip-  Both with yellow crust covering them.  No drainage. No bleeding.  No pain.  Small pinpoint white ulcer on mucous membrane inside posterior aspect of left upper lip. + dry chapped lips.   Eyes: Right eye exhibits no discharge. Left eye exhibits no discharge.  Cardiovascular: Normal rate.   Pulmonary/Chest: Effort normal. No respiratory distress.  Neurological: She is alert.  Skin: No rash noted.          Assessment & Plan:

## 2011-09-19 DIAGNOSIS — B001 Herpesviral vesicular dermatitis: Secondary | ICD-10-CM | POA: Insufficient documentation

## 2011-09-19 NOTE — Assessment & Plan Note (Signed)
Although pt has no history of these ulcers- the appearance of the lesions are consistent with healing herpetic lesions from fever blisters.  Pt to apply vasaline to are to soften the dry skin and scab that is present on the ulcer.  The ulcer inside mouth is benign appearing, should also resolve with time, can use oragel on this if any discomfort.

## 2011-09-29 HISTORY — PX: TRANSTHORACIC ECHOCARDIOGRAM: SHX275

## 2011-10-08 ENCOUNTER — Encounter: Payer: Self-pay | Admitting: Family Medicine

## 2011-10-08 ENCOUNTER — Ambulatory Visit: Payer: PRIVATE HEALTH INSURANCE

## 2011-10-08 ENCOUNTER — Ambulatory Visit (INDEPENDENT_AMBULATORY_CARE_PROVIDER_SITE_OTHER): Payer: PRIVATE HEALTH INSURANCE | Admitting: Family Medicine

## 2011-10-08 VITALS — BP 122/58 | HR 83 | Temp 98.7°F | Ht 67.0 in | Wt 165.6 lb

## 2011-10-08 DIAGNOSIS — J449 Chronic obstructive pulmonary disease, unspecified: Secondary | ICD-10-CM

## 2011-10-08 DIAGNOSIS — J4489 Other specified chronic obstructive pulmonary disease: Secondary | ICD-10-CM

## 2011-10-08 MED ORDER — IPRATROPIUM BROMIDE 0.02 % IN SOLN: 0.5000 mg | Freq: Once | RESPIRATORY_TRACT | Status: DC

## 2011-10-08 MED ORDER — ALBUTEROL SULFATE (2.5 MG/3ML) 0.083% IN NEBU: 2.5000 mg | INHALATION_SOLUTION | Freq: Four times a day (QID) | RESPIRATORY_TRACT | Status: DC | PRN

## 2011-10-08 MED ORDER — PREDNISONE 50 MG PO TABS: ORAL_TABLET | ORAL | Status: DC

## 2011-10-08 MED ORDER — ALBUTEROL SULFATE (2.5 MG/3ML) 0.083% IN NEBU: 2.5000 mg | INHALATION_SOLUTION | Freq: Once | RESPIRATORY_TRACT | Status: DC

## 2011-10-08 MED ORDER — DOXYCYCLINE HYCLATE 100 MG PO TABS: 100.0000 mg | ORAL_TABLET | Freq: Two times a day (BID) | ORAL | Status: DC

## 2011-10-08 NOTE — Progress Notes (Signed)
  Subjective:    Patient ID: [name], female    DOB: [birth year], 78 y.o.   MRN: [id number]  HPI 1.  Concern for bronchitis:  STarted having worsening breathing 3-4 days ago.  Uses Albuterol every 4 hours but not really helping.  Rhinorrhea and cough for same number of days.  Reduced sleep due to cough and trouble breathing.  Now short of breath just doing daily chores such as washing dishes, which is new for her.  Does not have diagnosis of COPD, does have diastolic heart failure for which she takes Furosemide.  Does endorse some chills but no fevers.  No chest pain or LE edema.  No palpitation.     Review of Systems See HPI above for review of systems.       Objective:   Physical Exam BP 122/58  Pulse 83  Temp(Src) 98.7 F (37.1 C) (Oral)  Ht 5\' 7"  (1.702 m)  Wt 165 lb 9.6 oz (75.116 kg)  BMI 25.94 kg/m2  SpO2 86% Gen:  Patient sitting on exam table, appears stated age in no acute distress Head: Normocephalic atraumatic Eyes: EOMI, PERRL, sclera and conjunctiva non-erythematous Nose:  Nares patent Mouth: Mucosa membranes moist. Tonsils +2, nonenlarged, non-erythematous. Neck: No cervical lymphadenopathy noted Heart:  RRR, no murmurs auscultated. Pulm:  Diffuse wheezing noted throughout all lung fields. Slightly increased work of breathing    **  Pulmonary exam after nebulizer: Much improved exam. Better aeration. Still with mild wheezing at bases.          Assessment & Plan:

## 2011-10-08 NOTE — Patient Instructions (Signed)
Take the prednisone 1 pill a day for 5 days. Take Doxycycline twice a day for 10 days. Use the nebulizer every 6 hours if you need it.   Come back Monday or Tuesday so we can make sure you're doing okay.

## 2011-10-10 ENCOUNTER — Telehealth: Payer: Self-pay | Admitting: Family Medicine

## 2011-10-10 NOTE — Telephone Encounter (Signed)
Patient is calling to let Dr. Edmonia [name] know that she had to start taking Glipizide because her Blood Sugars were unstable.  She has an appt next Tuesday so she will talk more about it then.

## 2011-10-10 NOTE — Assessment & Plan Note (Signed)
Patient does have what is most likely a COPD/asthma exacerbation. 2 pyoderma and albuterol/Atrovent treatment here in clinic. She is much better after this. Plan treat her with 7 day course of doxycycline as well as five-day course of steroid prednisone burst. I've also written her for a nebulizer machine to use at home. She is to followup with [place] next week to assess for improvement. She's followup sooner if she has any worsening.

## 2011-10-10 NOTE — Telephone Encounter (Signed)
Fwd. To Dr.Caviness for info. .[name]  

## 2011-10-10 NOTE — Telephone Encounter (Signed)
Ok, will discuss in more detail with patient at her f/up appointment.

## 2011-10-11 ENCOUNTER — Other Ambulatory Visit: Payer: Self-pay

## 2011-10-11 ENCOUNTER — Emergency Department (HOSPITAL_COMMUNITY): Payer: PRIVATE HEALTH INSURANCE

## 2011-10-11 ENCOUNTER — Telehealth: Payer: Self-pay | Admitting: Family Medicine

## 2011-10-11 ENCOUNTER — Inpatient Hospital Stay (HOSPITAL_COMMUNITY)
Admission: EM | Admit: 2011-10-11 | Discharge: 2011-10-13 | DRG: 191 | Disposition: A | Discharge status: Home / Self Care | Payer: PRIVATE HEALTH INSURANCE | Attending: Family Medicine | Admitting: Family Medicine

## 2011-10-11 ENCOUNTER — Encounter (HOSPITAL_COMMUNITY): Payer: Self-pay

## 2011-10-11 DIAGNOSIS — J4489 Other specified chronic obstructive pulmonary disease: Secondary | ICD-10-CM | POA: Diagnosis present

## 2011-10-11 DIAGNOSIS — R609 Edema, unspecified: Secondary | ICD-10-CM | POA: Diagnosis present

## 2011-10-11 DIAGNOSIS — J209 Acute bronchitis, unspecified: Principal | ICD-10-CM | POA: Diagnosis present

## 2011-10-11 DIAGNOSIS — I509 Heart failure, unspecified: Secondary | ICD-10-CM | POA: Diagnosis present

## 2011-10-11 DIAGNOSIS — J449 Chronic obstructive pulmonary disease, unspecified: Secondary | ICD-10-CM | POA: Diagnosis present

## 2011-10-11 DIAGNOSIS — E119 Type 2 diabetes mellitus without complications: Secondary | ICD-10-CM | POA: Diagnosis present

## 2011-10-11 DIAGNOSIS — G609 Hereditary and idiopathic neuropathy, unspecified: Secondary | ICD-10-CM | POA: Diagnosis present

## 2011-10-11 DIAGNOSIS — J441 Chronic obstructive pulmonary disease with (acute) exacerbation: Secondary | ICD-10-CM

## 2011-10-11 DIAGNOSIS — Z79899 Other long term (current) drug therapy: Secondary | ICD-10-CM

## 2011-10-11 DIAGNOSIS — IMO0002 Reserved for concepts with insufficient information to code with codable children: Secondary | ICD-10-CM

## 2011-10-11 DIAGNOSIS — I129 Hypertensive chronic kidney disease with stage 1 through stage 4 chronic kidney disease, or unspecified chronic kidney disease: Secondary | ICD-10-CM | POA: Diagnosis present

## 2011-10-11 DIAGNOSIS — Z7901 Long term (current) use of anticoagulants: Secondary | ICD-10-CM

## 2011-10-11 DIAGNOSIS — Z7982 Long term (current) use of aspirin: Secondary | ICD-10-CM

## 2011-10-11 DIAGNOSIS — N183 Chronic kidney disease, stage 3 unspecified: Secondary | ICD-10-CM | POA: Diagnosis present

## 2011-10-11 DIAGNOSIS — I5032 Chronic diastolic (congestive) heart failure: Secondary | ICD-10-CM | POA: Diagnosis present

## 2011-10-11 DIAGNOSIS — E785 Hyperlipidemia, unspecified: Secondary | ICD-10-CM | POA: Diagnosis present

## 2011-10-11 DIAGNOSIS — F17200 Nicotine dependence, unspecified, uncomplicated: Secondary | ICD-10-CM | POA: Diagnosis present

## 2011-10-11 DIAGNOSIS — I4891 Unspecified atrial fibrillation: Secondary | ICD-10-CM | POA: Diagnosis present

## 2011-10-11 DIAGNOSIS — J440 Chronic obstructive pulmonary disease with acute lower respiratory infection: Principal | ICD-10-CM | POA: Diagnosis present

## 2011-10-11 HISTORY — DX: Chronic obstructive pulmonary disease, unspecified: J44.9

## 2011-10-11 HISTORY — DX: Pneumonia, unspecified organism: J18.9

## 2011-10-11 LAB — PROTIME-INR
INR: 1.89 — ABNORMAL HIGH (ref 0.00–1.49)
Prothrombin Time: 22 seconds — ABNORMAL HIGH (ref 11.6–15.2)

## 2011-10-11 LAB — URINALYSIS, ROUTINE W REFLEX MICROSCOPIC
Glucose, UA: NEGATIVE mg/dL
Hgb urine dipstick: NEGATIVE
Ketones, ur: NEGATIVE mg/dL
Protein, ur: NEGATIVE mg/dL

## 2011-10-11 LAB — COMPREHENSIVE METABOLIC PANEL
AST: 27 U/L (ref 0–37)
Albumin: 3.9 g/dL (ref 3.5–5.2)
Alkaline Phosphatase: 54 U/L (ref 39–117)
BUN: 77 mg/dL — ABNORMAL HIGH (ref 6–23)
CO2: 23 mEq/L (ref 19–32)
Chloride: 103 mEq/L (ref 96–112)
GFR calc non Af Amer: 20 mL/min — ABNORMAL LOW (ref >90)
Potassium: 3.7 mEq/L (ref 3.5–5.1)
Total Bilirubin: 0.4 mg/dL (ref 0.3–1.2)

## 2011-10-11 LAB — CBC
HCT: 36.5 % (ref 36.0–46.0)
Hemoglobin: 11.9 g/dL — ABNORMAL LOW (ref 12.0–15.0)
MCHC: 32.6 g/dL (ref 30.0–36.0)
RBC: 3.88 MIL/uL (ref 3.87–5.11)
WBC: 12.4 10*3/uL — ABNORMAL HIGH (ref 4.0–10.5)

## 2011-10-11 LAB — CARDIAC PANEL(CRET KIN+CKTOT+MB+TROPI)
CK, MB: 12 ng/mL (ref 0.3–4.0)
Relative Index: 7.3 — ABNORMAL HIGH (ref 0.0–2.5)
Troponin I: <0.3 ng/mL (ref <0.30)

## 2011-10-11 LAB — BASIC METABOLIC PANEL
CO2: 20 mEq/L (ref 19–32)
Chloride: 103 mEq/L (ref 96–112)
Glucose, Bld: 313 mg/dL — ABNORMAL HIGH (ref 70–99)
Potassium: 3.9 mEq/L (ref 3.5–5.1)
Sodium: 137 mEq/L (ref 135–145)

## 2011-10-11 LAB — DIFFERENTIAL
Basophils Absolute: 0.1 10*3/uL (ref 0.0–0.1)
Basophils Relative: 0 % (ref 0–1)
Lymphocytes Relative: 12 % (ref 12–46)
Monocytes Relative: 7 % (ref 3–12)
Neutro Abs: 10 10*3/uL — ABNORMAL HIGH (ref 1.7–7.7)
Neutrophils Relative %: 81 % — ABNORMAL HIGH (ref 43–77)

## 2011-10-11 LAB — POCT I-STAT TROPONIN I: Troponin i, poc: 0.01 ng/mL (ref 0.00–0.08)

## 2011-10-11 LAB — PRO B NATRIURETIC PEPTIDE: Pro B Natriuretic peptide (BNP): 3917 pg/mL — ABNORMAL HIGH (ref 0–450)

## 2011-10-11 LAB — GLUCOSE, CAPILLARY

## 2011-10-11 MED ORDER — PREDNISONE 50 MG PO TABS
50.0000 mg | ORAL_TABLET | Freq: Every day | ORAL | Status: DC
  Administered 2011-10-12 – 2011-10-13 (×2): 50 mg via ORAL
  Filled 2011-10-11 (×3): qty 1

## 2011-10-11 MED ORDER — ENOXAPARIN SODIUM 30 MG/0.3ML ~~LOC~~ SOLN
30.0000 mg | Freq: Two times a day (BID) | SUBCUTANEOUS | Status: DC
  Administered 2011-10-11: 30 mg via SUBCUTANEOUS
  Filled 2011-10-11 (×2): qty 0.3

## 2011-10-11 MED ORDER — METHYLPREDNISOLONE SODIUM SUCC 125 MG IJ SOLR
125.0000 mg | Freq: Once | INTRAMUSCULAR | Status: AC
  Administered 2011-10-11: 125 mg via INTRAVENOUS
  Filled 2011-10-11: qty 2

## 2011-10-11 MED ORDER — IPRATROPIUM BROMIDE 0.02 % IN SOLN
0.5000 mg | Freq: Once | RESPIRATORY_TRACT | Status: AC
  Administered 2011-10-11: 0.5 mg via RESPIRATORY_TRACT
  Filled 2011-10-11: qty 2.5

## 2011-10-11 MED ORDER — SODIUM CHLORIDE 0.9 % IV BOLUS (SEPSIS)
500.0000 mL | Freq: Once | INTRAVENOUS | Status: AC
  Administered 2011-10-11: 500 mL via INTRAVENOUS

## 2011-10-11 MED ORDER — ACETAMINOPHEN 325 MG PO TABS: 650.0000 mg | ORAL_TABLET | Freq: Four times a day (QID) | ORAL | Status: DC | PRN

## 2011-10-11 MED ORDER — IPRATROPIUM BROMIDE 0.02 % IN SOLN
0.5000 mg | RESPIRATORY_TRACT | Status: DC
  Administered 2011-10-11 – 2011-10-13 (×12): 0.5 mg via RESPIRATORY_TRACT
  Filled 2011-10-11 (×13): qty 2.5

## 2011-10-11 MED ORDER — INSULIN ASPART 100 UNIT/ML ~~LOC~~ SOLN
0.0000 [IU] | Freq: Every day | SUBCUTANEOUS | Status: DC
  Administered 2011-10-11: 3 [IU] via SUBCUTANEOUS
  Administered 2011-10-12: 2 [IU] via SUBCUTANEOUS

## 2011-10-11 MED ORDER — OMEGA-3-ACID ETHYL ESTERS 1 G PO CAPS
1.0000 g | ORAL_CAPSULE | Freq: Two times a day (BID) | ORAL | Status: DC
  Administered 2011-10-11 – 2011-10-13 (×4): 1 g via ORAL
  Filled 2011-10-11 (×5): qty 1

## 2011-10-11 MED ORDER — FISH OIL 1000 MG PO CAPS: 1.0000 | ORAL_CAPSULE | Freq: Two times a day (BID) | ORAL | Status: DC

## 2011-10-11 MED ORDER — AMLODIPINE BESYLATE 10 MG PO TABS
10.0000 mg | ORAL_TABLET | Freq: Every day | ORAL | Status: DC
  Administered 2011-10-12 – 2011-10-13 (×2): 10 mg via ORAL
  Filled 2011-10-11 (×2): qty 1

## 2011-10-11 MED ORDER — ALBUTEROL SULFATE (5 MG/ML) 0.5% IN NEBU
5.0000 mg | INHALATION_SOLUTION | Freq: Once | RESPIRATORY_TRACT | Status: AC
  Administered 2011-10-11: 5 mg via RESPIRATORY_TRACT
  Filled 2011-10-11: qty 1

## 2011-10-11 MED ORDER — WARFARIN SODIUM 7.5 MG PO TABS
7.5000 mg | ORAL_TABLET | Freq: Once | ORAL | Status: AC
  Administered 2011-10-11: 7.5 mg via ORAL
  Filled 2011-10-11: qty 1

## 2011-10-11 MED ORDER — ALBUTEROL SULFATE (5 MG/ML) 0.5% IN NEBU
2.5000 mg | INHALATION_SOLUTION | Freq: Once | RESPIRATORY_TRACT | Status: AC
  Administered 2011-10-11: 2.5 mg via RESPIRATORY_TRACT

## 2011-10-11 MED ORDER — ATORVASTATIN CALCIUM 80 MG PO TABS
80.0000 mg | ORAL_TABLET | Freq: Every day | ORAL | Status: DC
  Administered 2011-10-11 – 2011-10-12 (×2): 80 mg via ORAL
  Filled 2011-10-11 (×3): qty 1

## 2011-10-11 MED ORDER — WARFARIN - PHARMACIST DOSING INPATIENT: Freq: Every day | Status: DC

## 2011-10-11 MED ORDER — CALCIUM CARBONATE-VITAMIN D 600-200 MG-UNIT PO TABS: 1.0000 | ORAL_TABLET | Freq: Every day | ORAL | Status: DC

## 2011-10-11 MED ORDER — CALCIUM CARBONATE-VITAMIN D 500-200 MG-UNIT PO TABS
1.0000 | ORAL_TABLET | Freq: Every day | ORAL | Status: DC
  Administered 2011-10-11 – 2011-10-13 (×3): 1 via ORAL
  Filled 2011-10-11 (×3): qty 1

## 2011-10-11 MED ORDER — ACETAMINOPHEN 650 MG RE SUPP: 650.0000 mg | Freq: Four times a day (QID) | RECTAL | Status: DC | PRN

## 2011-10-11 MED ORDER — FUROSEMIDE 10 MG/ML IJ SOLN
40.0000 mg | Freq: Every day | INTRAMUSCULAR | Status: DC
  Administered 2011-10-11 – 2011-10-13 (×3): 40 mg via INTRAVENOUS
  Filled 2011-10-11 (×3): qty 4

## 2011-10-11 MED ORDER — INSULIN ASPART 100 UNIT/ML ~~LOC~~ SOLN
0.0000 [IU] | Freq: Three times a day (TID) | SUBCUTANEOUS | Status: DC
  Administered 2011-10-11: 5 [IU] via SUBCUTANEOUS
  Filled 2011-10-11: qty 1

## 2011-10-11 MED ORDER — ALBUTEROL SULFATE (5 MG/ML) 0.5% IN NEBU: 2.5000 mg | INHALATION_SOLUTION | RESPIRATORY_TRACT | Status: DC | PRN

## 2011-10-11 MED ORDER — ALBUTEROL SULFATE (5 MG/ML) 0.5% IN NEBU
2.5000 mg | INHALATION_SOLUTION | RESPIRATORY_TRACT | Status: DC
  Administered 2011-10-11 – 2011-10-13 (×12): 2.5 mg via RESPIRATORY_TRACT
  Filled 2011-10-11 (×13): qty 0.5

## 2011-10-11 MED ORDER — ASPIRIN 81 MG PO CHEW
81.0000 mg | CHEWABLE_TABLET | Freq: Every day | ORAL | Status: DC
  Administered 2011-10-11 – 2011-10-13 (×3): 81 mg via ORAL
  Filled 2011-10-11 (×2): qty 1

## 2011-10-11 MED ORDER — DEXTROSE 5 % IV SOLN
500.0000 mg | INTRAVENOUS | Status: DC
  Administered 2011-10-11 – 2011-10-12 (×2): 500 mg via INTRAVENOUS
  Filled 2011-10-11 (×3): qty 500

## 2011-10-11 MED ORDER — PANTOPRAZOLE SODIUM 40 MG PO TBEC
40.0000 mg | DELAYED_RELEASE_TABLET | Freq: Every day | ORAL | Status: DC
  Administered 2011-10-11 – 2011-10-13 (×3): 40 mg via ORAL
  Filled 2011-10-11 (×3): qty 1

## 2011-10-11 MED ORDER — GUAIFENESIN ER 600 MG PO TB12
600.0000 mg | ORAL_TABLET | Freq: Two times a day (BID) | ORAL | Status: DC
  Administered 2011-10-11 – 2011-10-13 (×3): 600 mg via ORAL
  Filled 2011-10-11 (×5): qty 1

## 2011-10-11 MED ORDER — ALBUTEROL SULFATE (5 MG/ML) 0.5% IN NEBU
INHALATION_SOLUTION | RESPIRATORY_TRACT | Status: AC
  Filled 2011-10-11: qty 0.5

## 2011-10-11 MED ORDER — SODIUM CHLORIDE 0.45 % IV SOLN
INTRAVENOUS | Status: DC
  Administered 2011-10-11: 17:00:00 via INTRAVENOUS

## 2011-10-11 MED ORDER — INSULIN ASPART 100 UNIT/ML ~~LOC~~ SOLN
0.0000 [IU] | Freq: Three times a day (TID) | SUBCUTANEOUS | Status: DC
  Administered 2011-10-11: 7 [IU] via SUBCUTANEOUS
  Administered 2011-10-12: 2 [IU] via SUBCUTANEOUS
  Administered 2011-10-12: 1 [IU] via SUBCUTANEOUS
  Administered 2011-10-12: 3 [IU] via SUBCUTANEOUS
  Administered 2011-10-13: 5 [IU] via SUBCUTANEOUS
  Administered 2011-10-13: 3 [IU] via SUBCUTANEOUS

## 2011-10-11 MED ORDER — CARVEDILOL 6.25 MG PO TABS
6.2500 mg | ORAL_TABLET | Freq: Two times a day (BID) | ORAL | Status: DC
  Administered 2011-10-11 – 2011-10-13 (×5): 6.25 mg via ORAL
  Filled 2011-10-11 (×6): qty 1

## 2011-10-11 MED ORDER — GABAPENTIN 300 MG PO CAPS
300.0000 mg | ORAL_CAPSULE | Freq: Three times a day (TID) | ORAL | Status: DC
  Administered 2011-10-11 – 2011-10-13 (×6): 300 mg via ORAL
  Filled 2011-10-11 (×7): qty 1

## 2011-10-11 MED ORDER — SODIUM CHLORIDE 0.9 % IJ SOLN
3.0000 mL | Freq: Two times a day (BID) | INTRAMUSCULAR | Status: DC
  Administered 2011-10-12 – 2011-10-13 (×2): 3 mL via INTRAVENOUS

## 2011-10-11 NOTE — ED Notes (Signed)
CBG- [phone] 

## 2011-10-11 NOTE — ED Notes (Signed)
Report called, pt to be transported on cardiac monitor.

## 2011-10-11 NOTE — H&P (Signed)
[facility] Attending Note Patient seen and examined by me, discussed with resident team.  See my note that is addendum to Dr. Denny Peon Booth's H&P for more details.  Paula Compton, MD

## 2011-10-11 NOTE — ED Notes (Signed)
Pt. Was diagnosed with bronchitis 1 week ago and is not getting any better, Pt. Is wheezing and very sob

## 2011-10-11 NOTE — Telephone Encounter (Signed)
Patient calls regarding bronchitis/copd exacerbation. She was seen in clinic a few days ago, and Rx for doxycycline, prednisone, and albuterol nebulizer machine.  She says that she got a breathing treatment in clinic and felt a lot better, but the one at home is not working well.  She is audibly wheezing over the phone.  She denied chest pain/palpitations, fevers/chills.  Advised that if she felt it was an emergency, to come to ED, but she cant try increasing nebs to q4h (from q6h) and go to urgent care when they open in the morning.   [name] 10/11/2011 3:08 AM

## 2011-10-11 NOTE — ED Provider Notes (Signed)
History     CSN: [id number]  Arrival date & time 10/11/11  0803   First MD Initiated Contact with Patient 10/11/11 [phone]      Chief Complaint  Patient presents with  . Wheezing    (Consider location/radiation/quality/duration/timing/severity/associated sxs/prior treatment) HPI Patient is an 78 year old female with a history of diabetes as well as COPD, asthma, and atrial fibrillation. She presents today with oxygen saturation initially in the 70% range on room air. This quickly improves with nonrebreather to above 90%. Patient is not on home oxygen and has a history of asthma and COPD. She is not tachycardic or febrile today. Patient does feel very short of breath but denies chest pain. She does not have any leg swelling and denies significant weight gain. Patient is already on doxycycline and prednisone per her primary care provider. She's been trying nebulizers at home without success or improvement.There are no other associated or modifying factors.  Past Medical History  Diagnosis Date  . Anemia     anemia of renal disease  . Diabetes mellitus   . Hypertension   . Chronic kidney disease     Stage III- followed by [facility]  . CHF (congestive heart failure)     Diastolic dys-- EF 60% july 2011  . Hyperlipidemia   . Arthritis     mutiple joints  . Cancer     Renal neoplasm- survellience via Urology  . Peripheral neuropathy     Past Surgical History  Procedure Date  . Cholecystectomy     Family History  Problem Relation Age of Onset  . Heart disease Father     History  Substance Use Topics  . Smoking status: Passive Smoker  . Smokeless tobacco: Never Used  . Alcohol Use: No    OB History    Grav Para Term Preterm Abortions TAB SAB Ect Mult Living                  Review of Systems  Constitutional: Negative.   HENT: Negative.   Eyes: Negative.   Respiratory: Positive for cough, shortness of breath and wheezing.   Cardiovascular: Negative.     Gastrointestinal: Negative.   Genitourinary: Negative.   Musculoskeletal: Negative.   Skin: Negative.   Neurological: Negative.   Hematological: Negative.   Psychiatric/Behavioral: Negative.   All other systems reviewed and are negative.    Allergies  Review of patient's allergies indicates no known allergies.  Home Medications   Current Outpatient Rx  Name Route Sig Dispense Refill  . ALBUTEROL SULFATE HFA 108 (90 BASE) MCG/ACT IN AERS Inhalation Inhale 2 puffs into the lungs every 6 (six) hours as needed. For shortness of breath 1 Inhaler 1  . ALBUTEROL SULFATE (2.5 MG/3ML) 0.083% IN NEBU Nebulization Take 3 mLs (2.5 mg total) by nebulization every 6 (six) hours as needed for wheezing. 150 mL 1  . AMLODIPINE BESYLATE 10 MG PO TABS Oral Take 10 mg by mouth daily.    . ASPIRIN 81 MG PO CHEW Oral Chew 81 mg by mouth daily.     . ATORVASTATIN CALCIUM 80 MG PO TABS Oral Take 80 mg by mouth at bedtime.     Marland Kitchen CALCIUM CARBONATE-VITAMIN D 600-200 MG-UNIT PO TABS Oral Take 1 tablet by mouth daily.     Marland Kitchen CARVEDILOL 6.25 MG PO TABS Oral Take 6.25 mg by mouth 2 (two) times daily with a meal.    . CINNAMON PO Oral Take 1 tablet by mouth daily.     Marland Kitchen  DOXYCYCLINE HYCLATE 100 MG PO TABS Oral Take 100 mg by mouth 2 (two) times daily. 10 day course started 10-08-11    . GABAPENTIN 300 MG PO CAPS Oral Take 300 mg by mouth 3 (three) times daily.    Marland Kitchen GLIPIZIDE 5 MG PO TABS Oral Take 5 mg by mouth daily.    Marland Kitchen MINOCYCLINE HCL 100 MG PO CAPS Oral Take 100 mg by mouth 2 (two) times daily. Filled 09/03/11 take thru 12/02/11    . FISH OIL 1000 MG PO CAPS Oral Take 1 capsule by mouth 2 (two) times daily.     Marland Kitchen OMEPRAZOLE 20 MG PO CPDR Oral Take 20 mg by mouth daily as needed. For acid reflux    . PREDNISONE 50 MG PO TABS Oral Take 50 mg by mouth daily. 5 day course started 10-08-11    . PRESCRIPTION MEDICATION  1 tablet daily. For heart and kidneys - starts with "l"    . PRESCRIPTION MEDICATION Both Eyes Place 1  drop into both eyes daily. Eye drops from doctor for dry eyes    . WARFARIN SODIUM 5 MG PO TABS Oral Take 5-7.5 mg by mouth daily. Take 1.5 tablets (7.5mg ) on Mondays and Fridays Take 1 tablet (5 mg) all other days of the week.      BP 112/93  Pulse 67  Temp(Src) 97.6 F (36.4 C) (Oral)  Resp 20  SpO2 100%  Physical Exam  Nursing note and vitals reviewed. GEN: Well-developed, well-nourished female in mild distress HEENT: Atraumatic, normocephalic. Oropharynx clear without erythema EYES: PERRLA BL, no scleral icterus. NECK: Trachea midline, no meningismus CV: Regular rate with irregularly irregular rhythm. No murmurs, rubs, or gallops PULM: Patient is cachectic with diffuse wheezes noted. No crackles or rales.  GI: soft, non-tender. No guarding, rebound, or tenderness. + bowel sounds  GU: deferred Neuro: cranial nerves 2-12 intact, no abnormalities of strength or sensation, A and O x 3 MSK: Patient moves all 4 extremities symmetrically, no deformity, edema, or injury noted Skin: No rashes petechiae, purpura, or jaundice Psych: no abnormality of mood   ED Course  Procedures (including critical care time)   Date: 10/11/2011  Rate: 74  Rhythm: atrial fibrillation  QRS Axis: left  Intervals: normal  ST/T Wave abnormalities: nonspecific T wave changes  Conduction Disutrbances:left bundle branch block  Narrative Interpretation:   Old EKG Reviewed: unchanged   Labs Reviewed  CBC - Abnormal; Notable for the following:    WBC 12.4 (*)    Hemoglobin 11.9 (*)    All other components within normal limits  DIFFERENTIAL - Abnormal; Notable for the following:    Neutrophils Relative 81 (*)    Neutro Abs 10.0 (*)    All other components within normal limits  COMPREHENSIVE METABOLIC PANEL - Abnormal; Notable for the following:    Glucose, Bld 165 (*)    BUN 77 (*)    Creatinine, Ser 2.19 (*)    GFR calc non Af Amer 20 (*)    GFR calc Af Amer 23 (*)    All other components within  normal limits  GLUCOSE, CAPILLARY - Abnormal; Notable for the following:    Glucose-Capillary 145 (*)    All other components within normal limits  PROTIME-INR - Abnormal; Notable for the following:    Prothrombin Time 22.0 (*)    INR 1.89 (*)    All other components within normal limits  POCT I-STAT TROPONIN I  URINALYSIS, ROUTINE W REFLEX MICROSCOPIC   Dg  Chest 2 View  10/11/2011  *RADIOLOGY REPORT*  Clinical Data: Wheezing and shortness of breath  CHEST - 2 VIEW  Comparison: 09/07/2011 and prior chest radiographs  Findings: The cardiomediastinal silhouette is unremarkable. There is no evidence of focal airspace disease, pulmonary edema, suspicious pulmonary nodule/mass, pleural effusion, or pneumothorax. No acute bony abnormalities are identified. Degenerative changes in the shoulders again noted.  IMPRESSION: No evidence of acute cardiopulmonary disease.  Original Report Authenticated By: Rosendo Gros, M.D.     1. COPD exacerbation       MDM  Patient was evaluated by myself. She was given Solu-Medrol and a duo neb. Patient quickly had improvement in her oxygenation. She will was placed on nonrebreather initially and quickly weaned to nasal cannula. Patient did not have any signs of pneumonia on her chest x-ray. She is slightly subtherapeutic INR. EKG was unchanged and troponin was negative. Patient was completely without chest pain. Patient was discussed with family medicine team who will make the patient for COPD exacerbation. Patient was placed on wheeze protocol while awaiting admission.        Cyndra Numbers, MD 10/11/11 [phone]

## 2011-10-11 NOTE — H&P (Signed)
[name] is an 78 y.o. female.   Chief Complaint: Bronchitis, SOB, Wheezing  HPI:  Patient is an 78 y/o lady who comes to the ER 2nd to worsening respiratory distress. She has a history of lung disease from chronic second hand smoke (with normal PFT's in 2011) She has had multiple encounters for Bronchitis causing respiratory distress. She has no fevers. She has a cough that is non-productive. She says her weight fluctuates, but no acute weight loss, she is on Lasix for edema and diastolic heart failure. She has no other symptoms. Denies chest pain. She was treated for her Bronchitis by dr. Gwendolyn Grant with doxycycline and prednisone 50 mg daily.   Past Medical History  Diagnosis Date  . Anemia     anemia of renal disease  . Diabetes mellitus   . Hypertension   . Chronic kidney disease     Stage III- followed by [facility]  . CHF (congestive heart failure)     Diastolic dys-- EF 60% july 2011  . Hyperlipidemia   . Arthritis     mutiple joints  . Cancer     Renal neoplasm- survellience via Urology  . Peripheral neuropathy     Past Surgical History  Procedure Date  . Cholecystectomy     Family History  Problem Relation Age of Onset  . Heart disease Father    Social History:  reports that she has been passively smoking.  She has never used smokeless tobacco. She reports that she does not drink alcohol or use illicit drugs.  Allergies: No Known Allergies  ROS Pertinent items are noted in HPI.   Blood pressure 112/93, pulse 67, temperature 97.6 F (36.4 C), temperature source Oral, resp. rate 20, SpO2 100.00%.  Physical Exam  Lungs:  Increased resp. Effort, bilateral diffuse wheezing, no crackles or egophony.  Heart - No murmurs, gallops or rubs.  Afib, rate controlled.   Neck:  3cm JVD, No deformities, thyromegaly, masses, or tenderness noted.   Supple with full range of motion without pain. Skin:  Intact without suspicious lesions or rashes Extremities:     Non-tender, No cyanosis, 2-3+ pitting edema, no deformity noted. Neurologic exam : Cn 2-7 intact Strength equal & normal in upper & lower extremities finger to nose without dysmetria  MMSE: 29/30  Assessment/Plan 78 y/o female with respiratory distress secondary to acute bronchitis without sign of infection failing outpatient management.  1. Acute bronchitis/respiratory distress She believes that second hand smoke triggered this event. No signs of infection Continue Prednisone 50 mg daily Azithromycin IV DC doxy Combivent Nebs q 4 hours O2 PRN  2. Edema/JVD/diastolic CHF Patient has not had exacerbations of CHF, but is on lasix daily and has had a normal cardiac cath in Feb. 2013 by Dr. Rennis Golden. Last Echo was 60% in 2011. Patient could possibly have worsening right sided heart failure without pulmonary edema (negative exam/cXray) Will obtain echo and BNP C/w Lasix 40 mg daily  3. CKD3 Increased Creatinine  Lab Results  Component Value Date   CREATININE 2.19* 10/11/2011  last was 1.6 average. This is likely secondary to Prerenal azotemia. Will give gentle hydration with 75 cc/hr of fluid. Lasix 40 mg IV daily.  Regular diet.   4. AFIB - rate controlled Coumadin per pharmacy Lab Results  Component Value Date   INR 1.89* 10/11/2011   INR 2.24* 09/07/2011   INR 1.23 08/12/2011  c/w Carvedilol, Amlodipine.  Telemetry  5. HTN C/w current medications BP Readings from Last  3 Encounters:  10/11/11 119/47  10/08/11 122/58  09/17/11 107/61   6. DM Lab Results  Component Value Date   HGBA1C 6.6 07/29/2011  will start sliding scale while in hospital because of steroid therapy. Holing glipizide.  7. FENGI/PROPH: coumadin per pharmacy and Lovenox till therapeutic. Heart Diet. IVF 75 cc/hr  8. Dispo: Observation and respiratory treatment.   Edd Arbour MD 10/11/2011, 11:03 AM

## 2011-10-11 NOTE — H&P (Signed)
FMTS Attending Admit Note Patient seen and examined by me in ED, discussed with resident team and I agree with plan as outlined.  Briefly, an 78 yr old F with rate-controlled AF and anticoagulation, who presents with worsening dyspnea and orthopnea over the past several days.  Was seen in [facility] 3 days ago and put on prednisone burst and doxycycline, but has had continued worsening.  She has had negative spirometry in the [facility] in the past, and is not a smoker but is exposed to second-hand smoke.   She carries a diagnosis of diastolic HF, had cardiac cath in Feb 2013 which does not estimate her LVEF.  She receives diuretics from her cardiologist, medication dosing not clear at the time of this note.  She reports she has had worsening of her ankle edema, and she notes that her breathing is improved with sitting upright. She does have some JVD noted when laying supine at 30 degrees.  2+ bilateral ankle edema noted as well. Expiratory wheezes noted on lung exam, without notable crackles.  CXR does not show infiltrates or pulmonary edema.  Assess/Plan: Patient with subacute dyspnea and orthopnea, perhaps some worsening of peripheral edema, failure to respond to outpatient antibiotics and prednisone.  She is anticoagulated (INR mildly subtherapeutic at this time).  By exam, peripheral edema raises concern for R sided heart failure. Will get BNP and ECHO, cycle cardiac enzymes and ECG.  To diurese with lasix; to get baseline diuretic regimen (from ?outpatient pharmacy where she gets her meds?)  Will cover with azithromycin, continue systemic steroids, and duonebs.  Paula Compton, MD

## 2011-10-11 NOTE — ED Notes (Signed)
Pt covered for sliding scale, floor nurse notified. Given snack and sent up to floor. No signs of distress noted at the present.

## 2011-10-11 NOTE — H&P (Signed)
Family Medicine Teaching [facility] Admission History and Physical  Patient name: CIENA SAMPLEY Medical record number: [id number] Date of birth: 04/06/1928 Age: 78 y.o. Gender: female  Primary Care Provider: Ellin Mayhew, MD, MD  Chief Complaint: dyspnea History of Present Illness: JARAH PEMBER is a 78 y.o. year old female with HTN, DM, CKD stage III, dCHF presenting with dyspnea.  The current episode started several days ago and she was seen by Dr. Gwendolyn Grant in clinic.  She received Duoneb x2 in the clinic resulted in much improvement. At that time was started on a steroid burst, doxycycline and albuterol nebs.  Her breathing has continued to worsen and she return to the ED after consulting the resident on call last night.  She states the albuterol at home is not helping at all.  Does have increased cough from baseline, but non-productive.  Denies fevers, chills.  No chest pain or increased edema that she has noticed.  No diaphoresis or weight loss.  No history of smoking, but is exposed to passive smoke.  Patient Active Problem List  Diagnoses  . NEOPLASM, KIDNEY  . DIABETES MELLITUS II, UNCOMPLICATED  . HYPERLIPIDEMIA  . NEUROPATHY, PERIPHERAL  . HYPERTENSION, BENIGN SYSTEMIC  . DIASTOLIC DYSFUNCTION  . RENAL DISEASE, CHRONIC, STAGE III  . OSTEOARTHRITIS, MULTI SITES  . LOW BACK PAIN, CHRONIC  . ABNORMALITY OF GAIT  . Chronic obstructive airway disease with asthma  . Hypercalcemia  . Atrial fibrillation  . Right shoulder pain  . Venous insufficiency  . Left breast lump  . Warfarin anticoagulation  . URI (upper respiratory infection)  . Fever blister   Past Medical History: Past Medical History  Diagnosis Date  . Anemia     anemia of renal disease  . Diabetes mellitus   . Hypertension   . Chronic kidney disease     Stage III- followed by [facility]  . CHF (congestive heart failure)     Diastolic dys-- EF 60% july 2011  . Hyperlipidemia   . Arthritis    mutiple joints  . Cancer     Renal neoplasm- survellience via Urology  . Peripheral neuropathy     Past Surgical History: Past Surgical History  Procedure Date  . Cholecystectomy     Social History: History   Social History  . Marital Status: Married    Spouse Name: N/A    Number of Children: N/A  . Years of Education: N/A   Social History Main Topics  . Smoking status: Passive Smoker  . Smokeless tobacco: Never Used  . Alcohol Use: No  . Drug Use: No  . Sexually Active: None   Other Topics Concern  . None   Social History Narrative  . None    Family History: Family History  Problem Relation Age of Onset  . Heart disease Father     Allergies: No Known Allergies  Current Facility-Administered Medications on File Prior to Encounter  Medication Dose Route Frequency Provider Last Rate Last Dose  . 0.9 %  sodium chloride infusion   Intravenous Continuous Chrystie Nose, MD      . albuterol (PROVENTIL) (2.5 MG/3ML) 0.083% nebulizer solution 2.5 mg  2.5 mg Nebulization Once Tobey Grim, MD      . ipratropium (ATROVENT) nebulizer solution 0.5 mg  0.5 mg Nebulization Once Tobey Grim, MD      . sodium chloride 0.9 % injection 3 mL  3 mL Intravenous Q12H Chrystie Nose, MD  Current Outpatient Prescriptions on File Prior to Encounter  Medication Sig Dispense Refill  . albuterol (PROVENTIL HFA;VENTOLIN HFA) 108 (90 BASE) MCG/ACT inhaler Inhale 2 puffs into the lungs every 6 (six) hours as needed. For shortness of breath  1 Inhaler  1  . albuterol (PROVENTIL) (2.5 MG/3ML) 0.083% nebulizer solution Take 3 mLs (2.5 mg total) by nebulization every 6 (six) hours as needed for wheezing.  150 mL  1  . aspirin 81 MG chewable tablet Chew 81 mg by mouth daily.       Marland Kitchen atorvastatin (LIPITOR) 80 MG tablet Take 80 mg by mouth at bedtime.       . Calcium Carbonate-Vitamin D (CALCIUM 600+D) 600-200 MG-UNIT TABS Take 1 tablet by mouth daily.       Marland Kitchen CINNAMON PO Take 1  tablet by mouth daily.       . furosemide (LASIX) 20 MG tablet Take 20 mg by mouth 2 (two) times daily.      Marland Kitchen gabapentin (NEURONTIN) 300 MG capsule Take 300 mg by mouth 3 (three) times daily.      Marland Kitchen losartan (COZAAR) 50 MG tablet Take 50 mg by mouth daily.      . Omega-3 Fatty Acids (FISH OIL) 1000 MG CAPS Take 1 capsule by mouth 2 (two) times daily.       Marland Kitchen omeprazole (PRILOSEC) 20 MG capsule Take 20 mg by mouth daily as needed. For acid reflux      . warfarin (COUMADIN) 5 MG tablet Take 5-7.5 mg by mouth daily. Take 1.5 tablets (7.5mg ) on Mondays and Fridays Take 1 tablet (5 mg) all other days of the week.        Review Of Systems: Per HPI with the following additions: none Otherwise 12 point review of systems was performed and was unremarkable.  Physical Exam: Pulse: 69  Blood Pressure: 119/47 RR: 20   O2: 93% on RA Temp: 97.6  General: alert, cooperative, mild distress and appears younger that stated age HEENT: PERRLA, extra ocular movement intact, sclera clear, anicteric, oropharynx clear, no lesions and neck supple with midline trachea; 3cm of JVD Heart: irregularly irregular rhythm, normal rate, no murmurs or gallops Lungs: increased WOB with some belly breathing, rhonchi (clear some with coughing), diffuse wheezing, no rales Abdomen: abdomen is soft without significant tenderness, masses, organomegaly or guarding Extremities: 2+ pitting edema to mid-shin, palpable pulses Skin:no rashes Neurology: normal without focal findings, mental status, speech normal, alert and oriented x3, PERLA and MMSE 29/30  Labs and Imaging: Lab Results  Component Value Date/Time   NA 139 10/11/2011  8:20 AM   K 3.7 10/11/2011  8:20 AM   CL 103 10/11/2011  8:20 AM   CO2 23 10/11/2011  8:20 AM   BUN 77* 10/11/2011  8:20 AM   CREATININE 2.19* 10/11/2011  8:20 AM   CREATININE 1.42* 03/19/2011 11:20 AM   GLUCOSE 165* 10/11/2011  8:20 AM   Lab Results  Component Value Date   WBC 12.4* 10/11/2011   HGB 11.9*  10/11/2011   HCT 36.5 10/11/2011   MCV 94.1 10/11/2011   PLT 217 10/11/2011   POC troponin negative  INR 1.89  EKG: A fib, LBBB, unchanged from previous  CXR No evidence of acute cardiopulmonary disease  Assessment and Plan: SHIZUE KASEMAN is a 78 y.o. year old female with dCHF, CKD stage III, HTN, DM presenting with acute bronchitis that failed outpatient management.   # Pulm: Acute bronchitis.  PFTs in the office  were normal in 03/2011.  Seen in clinic on 4/10 for this problem and was started on prednisone 50mg  x5 days, doxycycline x10 days, and albuterol nebs.  However, she did not improve and returned to the ED.  Did require some initial O2 in the ED.  S/p solumedrol x1, duonebs x1. - not currently requiring any O2 - continue prednisone burst (last day 4/14) - start azithromycin 500mg  IV daily, likely change to PO tomorrow - Duonebs q4hr scheduled - Albuterol q2prn for wheezing - Mucinex 600mg  BID  # CV: History of dCHF, HTN, A fib on coumadin, HLD.  Does appear volume overloaded on exam, but lungs are clear on chest x-ray.  Last ECHO in chart in 2011 showed grade 1 diastolic dysfunction with preserved EF; on lasix at home, but unknown dose.  Cardiac cath in 08/2011 was normal.  EKG was unchanged and POC troponin was negative in the ED.  No complaint of chest pain.  - will check BNP - will get ECHO - continue lasix 40mg  IV daily (unknown home dose) - monitor I&Os - continue home amlodipine, carvedilol, statin - will dose coumadin per pharmacy as sub-therapeutic on admission and cover with lovenox until therapeutic  # Renal: CKD stage III; baseline creatinine appears to be around 1.6.  BUN and creatinine are mildly elevated on admission (77/2.19).  S/p 500cc bolus in ED.  She appears more volume overloaded than dry. - lasix as above - monitor I&Os closely - recheck Cr in am  # Endo: DM, last A1c 6.6 07/2011.   - will hold home glipizide - sensitive SSI  FEN/GI: heart diet, 1/2NS  at 75cc/hr Prophylaxis: lovenox and coumadin Disposition: admit to telemetry for observation  BOOTH, Kavir Savoca 10/11/2011, 12:05 PM

## 2011-10-11 NOTE — Progress Notes (Signed)
ANTICOAGULATION CONSULT NOTE - Initial Consult  Pharmacy Consult for coumadin Indication: atrial fibrillation  No Known Allergies  Patient Measurements: Height: 5\' 7"  (170.2 cm) Weight: 169 lb 9.6 oz (76.93 kg) IBW/kg (Calculated) : 61.6   Vital Signs: Temp: 97.3 F (36.3 C) (04/13 1600) Temp src: Oral (04/13 1600) BP: 138/75 mmHg (04/13 1600) Pulse Rate: 89  (04/13 1600)  Labs:  Basename 10/11/11 0820  HGB 11.9*  HCT 36.5  PLT 217  APTT --  LABPROT 22.0*  INR 1.89*  HEPARINUNFRC --  CREATININE 2.19*  CKTOTAL --  CKMB --  TROPONINI --   Estimated Creatinine Clearance: 21.5 ml/min (by C-G formula based on Cr of 2.19).  Medical History: Past Medical History  Diagnosis Date  . Anemia     anemia of renal disease  . Diabetes mellitus   . Hypertension   . Chronic kidney disease     Stage III- followed by [facility]  . CHF (congestive heart failure)     Diastolic dys-- EF 60% july 2011  . Hyperlipidemia   . Arthritis     mutiple joints  . Cancer     Renal neoplasm- survellience via Urology  . Peripheral neuropathy     Medications:  Prescriptions prior to admission  Medication Sig Dispense Refill  . albuterol (PROVENTIL HFA;VENTOLIN HFA) 108 (90 BASE) MCG/ACT inhaler Inhale 2 puffs into the lungs every 6 (six) hours as needed. For shortness of breath  1 Inhaler  1  . albuterol (PROVENTIL) (2.5 MG/3ML) 0.083% nebulizer solution Take 3 mLs (2.5 mg total) by nebulization every 6 (six) hours as needed for wheezing.  150 mL  1  . amLODipine (NORVASC) 10 MG tablet Take 10 mg by mouth daily.      Marland Kitchen aspirin 81 MG chewable tablet Chew 81 mg by mouth daily.       Marland Kitchen atorvastatin (LIPITOR) 80 MG tablet Take 80 mg by mouth at bedtime.       . Calcium Carbonate-Vitamin D (CALCIUM 600+D) 600-200 MG-UNIT TABS Take 1 tablet by mouth daily.       . carvedilol (COREG) 6.25 MG tablet Take 6.25 mg by mouth 2 (two) times daily with a meal.      . CINNAMON PO Take 1 tablet by  mouth daily.       Marland Kitchen doxycycline (VIBRA-TABS) 100 MG tablet Take 100 mg by mouth 2 (two) times daily. 10 day course started 10-08-11      . furosemide (LASIX) 20 MG tablet Take 20 mg by mouth 2 (two) times daily.      Marland Kitchen gabapentin (NEURONTIN) 300 MG capsule Take 300 mg by mouth 3 (three) times daily.      Marland Kitchen glipiZIDE (GLUCOTROL) 5 MG tablet Take 5 mg by mouth daily.      Marland Kitchen losartan (COZAAR) 50 MG tablet Take 50 mg by mouth daily.      . Omega-3 Fatty Acids (FISH OIL) 1000 MG CAPS Take 1 capsule by mouth 2 (two) times daily.       Marland Kitchen omeprazole (PRILOSEC) 20 MG capsule Take 20 mg by mouth daily as needed. For acid reflux      . predniSONE (DELTASONE) 50 MG tablet Take 50 mg by mouth daily. 5 day course started 10-08-11      . PRESCRIPTION MEDICATION Place 1 drop into both eyes daily. Eye drops from doctor for dry eyes      . warfarin (COUMADIN) 5 MG tablet Take 5-7.5 mg by mouth daily.  Take 1.5 tablets (7.5mg ) on Mondays and Fridays Take 1 tablet (5 mg) all other days of the week.        Assessment: 78 yo female here with SOB (acute bronchitis) on coumadin at home and INR = 1.89. Home coumadin dose ntoed as 5mg /day except 7.5mg  MFr (last taken 10/10/11).  Goal of Therapy:  INR 2-3   Plan:  -Will give coumadin 7.5mg  today -PT/INR daily  Benny Lennert 10/11/2011,4:22 PM

## 2011-10-11 NOTE — ED Notes (Signed)
Pt. Placed on oxygen 2l [place]. Due to sats at 77%

## 2011-10-11 NOTE — ED Notes (Signed)
[id number] [place]

## 2011-10-11 NOTE — ED Notes (Signed)
Patient transported to X-ray 

## 2011-10-11 NOTE — Progress Notes (Signed)
CRITICAL VALUE ALERT  Critical value received: CKMB 12.0  Date of notification:  10/11/11  Time of notification:  17:50  Critical value read back:yes   Nurse who received alert:  Michelle Nasuti, RN  MD notified (1st page): Family Practice   Time of first page: 18:00  MD notified (2nd page):na  Time of second page:na  Responding MD:  Despina Hick   Time MD responded: 18:01

## 2011-10-12 LAB — CBC
HCT: 33.8 % — ABNORMAL LOW (ref 36.0–46.0)
Hemoglobin: 11.1 g/dL — ABNORMAL LOW (ref 12.0–15.0)
MCH: 30.8 pg (ref 26.0–34.0)
MCHC: 32.8 g/dL (ref 30.0–36.0)

## 2011-10-12 LAB — BASIC METABOLIC PANEL
BUN: 70 mg/dL — ABNORMAL HIGH (ref 6–23)
Chloride: 104 mEq/L (ref 96–112)
Creatinine, Ser: 1.77 mg/dL — ABNORMAL HIGH (ref 0.50–1.10)
Glucose, Bld: 214 mg/dL — ABNORMAL HIGH (ref 70–99)
Potassium: 4 mEq/L (ref 3.5–5.1)

## 2011-10-12 LAB — GLUCOSE, CAPILLARY
Glucose-Capillary: 181 mg/dL — ABNORMAL HIGH (ref 70–99)
Glucose-Capillary: 146 mg/dL — ABNORMAL HIGH (ref 70–99)
Glucose-Capillary: 243 mg/dL — ABNORMAL HIGH (ref 70–99)

## 2011-10-12 LAB — CARDIAC PANEL(CRET KIN+CKTOT+MB+TROPI): CK, MB: 8.8 ng/mL (ref 0.3–4.0)

## 2011-10-12 MED ORDER — PREDNISONE 50 MG PO TABS: ORAL_TABLET | ORAL | Status: DC

## 2011-10-12 MED ORDER — WARFARIN SODIUM 5 MG PO TABS
5.0000 mg | ORAL_TABLET | ORAL | Status: DC
  Administered 2011-10-12: 5 mg via ORAL
  Filled 2011-10-12: qty 1

## 2011-10-12 MED ORDER — WARFARIN SODIUM 7.5 MG PO TABS
7.5000 mg | ORAL_TABLET | ORAL | Status: DC
  Filled 2011-10-12: qty 1

## 2011-10-12 MED ORDER — DOXYCYCLINE HYCLATE 100 MG PO TABS: 100.0000 mg | ORAL_TABLET | Freq: Two times a day (BID) | ORAL | Status: DC

## 2011-10-12 NOTE — Progress Notes (Signed)
ANTICOAGULATION CONSULT NOTE - Follow Up Consult  Pharmacy Consult for Coumadin Indication: atrial fibrillation  No Known Allergies  Patient Measurements: Height: 5\' 7"  (170.2 cm) Weight: 172 lb 6.4 oz (78.2 kg) IBW/kg (Calculated) : 61.6  Heparin Dosing Weight:   Vital Signs: Temp: 97.7 F (36.5 C) (04/14 0500) Temp src: Oral (04/14 0500) BP: 112/63 mmHg (04/14 0842) Pulse Rate: 79  (04/14 0842)  Labs:  Basename 10/12/11 0003 10/12/11 0002 10/11/11 1620 10/11/11 0820  HGB -- 11.1* -- 11.9*  HCT -- 33.8* -- 36.5  PLT -- 200 -- 217  APTT -- -- -- --  LABPROT -- 28.1* -- 22.0*  INR -- 2.58* -- 1.89*  HEPARINUNFRC -- -- -- --  CREATININE -- 1.77* 1.93* 2.19*  CKTOTAL 104 -- 164 --  CKMB 8.8* -- 12.0* --  TROPONINI <0.30 -- <0.30 --   Estimated Creatinine Clearance: 26.8 ml/min (by C-G formula based on Cr of 1.77).  Assessment: 78 yo female here with SOB (acute bronchitis) on coumadin at home for afib with INR = 1.89. Home coumadin dose noted as 5mg /day except 7.5mg  MFr (last taken 10/10/11). INR today now in goal range 2.58.  Cards:  Afib, HTN, CHF (right sided with pulmonary edema). Normal cath 2/13.  Meds: IV Lasix, Norvasc, ASA 81mg , Lipitor, Coreg, Lovaza  ID/Pulm: Acute bronchitis.  Meds: Albuterol, IV Azithro, Atrovent,Prednisone  DM: A1C=6.6. CBGs 145-302 now on steroids.  Renal: CKD. Scr down a little to 1.77.  Goal of Therapy:  INR 2-3   Plan:  Resume home regimen.  Merilynn Finland, [employer] 10/12/2011,10:45 AM

## 2011-10-12 NOTE — Progress Notes (Signed)
Subjective: No complaints this morning.  Breathing has improved.  Ate all of her breakfast.  In no distress.   Objective: Vital signs in last 24 hours: Temp:  [97.3 F (36.3 C)-97.7 F (36.5 C)] 97.7 F (36.5 C) (04/14 0500) Pulse Rate:  [67-89] 79  (04/14 0842) Resp:  [18-20] 18  (04/14 0500) BP: (112-138)/(42-93) 112/63 mmHg (04/14 0842) SpO2:  [92 %-100 %] 99 % (04/14 0500) Weight:  [169 lb 9.6 oz (76.93 kg)-172 lb 6.4 oz (78.2 kg)] 172 lb 6.4 oz (78.2 kg) (04/14 0500) Weight change:  Last BM Date: 10/12/11  Intake/Output from previous day: 04/13 0701 - 04/14 0700 In: 1065 [P.O.:240; I.V.:825] Out: 500 [Urine:500] Intake/Output this shift: Total I/O In: -  Out: 150 [Urine:150] PE: General: C/F, nad, sitting up in bed eating breakfast.  Lungs:  Normal respiratory effort, chest expands symmetrically. Minimal wheezing bilaterally, no crackles. Heart - Regular rate and rhythm.  No murmurs, gallops or rubs.    Skin:  Intact without suspicious lesions or rashes Extremities:   Non-tender, No cyanosis, edema, or deformity noted.  Lab Results:  [facility] 10/12/11 0002 10/11/11 0820  WBC 6.6 12.4*  HGB 11.1* 11.9*  HCT 33.8* 36.5  PLT 200 217   BMET  Basename 10/12/11 0002 10/11/11 1620  NA 138 137  K 4.0 3.9  CL 104 103  CO2 24 20  GLUCOSE 214* 313*  BUN 70* 72*  CREATININE 1.77* 1.93*  CALCIUM 9.4 9.3    Studies/Results: Dg Chest 2 View  10/11/2011  *RADIOLOGY REPORT*  Clinical Data: Wheezing and shortness of breath  CHEST - 2 VIEW  Comparison: 09/07/2011 and prior chest radiographs  Findings: The cardiomediastinal silhouette is unremarkable. There is no evidence of focal airspace disease, pulmonary edema, suspicious pulmonary nodule/mass, pleural effusion, or pneumothorax. No acute bony abnormalities are identified. Degenerative changes in the shoulders again noted.  IMPRESSION: No evidence of acute cardiopulmonary disease.  Original Report Authenticated By:  Rosendo Gros, M.D.   Medications: I have reviewed the patient's current medications.  Assessment/Plan: 78 y/o female with improving lung function after treatment for acute bronchitis and right sided heart failure.   1. Acute bronchitis/respiratory distress  She believes that second hand smoke triggered this event. No signs of infection  Continue Prednisone 50 mg daily  Azithromycin IV - switch to PO Combivent Nebs q 4 hours  O2 PRN   2. Edema/JVD/orthopnea/right sided CHF  Patient has not had exacerbations of CHF, but is on lasix daily and has had a normal cardiac cath in Feb. 2013 by Dr. Rennis Golden.  Last Echo was 60% in 2011.  Patient has right sided heart failure without pulmonary edema (negative exam/cXray)  Will obtain echo today C/w Lasix 40 mg daily   3. CKD3  Increased Creatinine  Lab Results   Component  Value  Date    CREATININE  2.19*  10/11/2011   last was 1.6 average.  This is likely secondary to Prerenal azotemia.  Will give gentle hydration with 75 cc/hr of fluid.  Lasix 40 mg IV daily.  Regular diet.   4. AFIB - rate controlled  Coumadin per pharmacy  Lab Results  Component Value Date   INR 2.58* 10/12/2011   INR 1.89* 10/11/2011   INR 2.24* 09/07/2011                       c/w Carvedilol, Amlodipine.  Telemetry   5. HTN  C/w current medications  BP Readings  from Last 3 Encounters:  10/12/11 112/63  10/08/11 122/58  09/17/11 107/61              6. DM  Lab Results   Component  Value  Date    HGBA1C  6.6  07/29/2011   will start sliding scale while in hospital because of steroid therapy.  Holing glipizide.  7. FENGI/PROPH: coumadin per pharmacy and Lovenox till therapeutic. Heart Diet. IVF 75 cc/hr  8. Dispo: Observation and respiratory treatment. Possible DC today pending attending evaluation.    LOS: 1 day   Rhaya Coale MD 10/12/2011, 8:48 AM

## 2011-10-12 NOTE — Discharge Summary (Signed)
Physician Discharge Summary  Patient ID: [name] MRN: [id number] DOB/AGE: Oct 08, 1927 78 y.o.  Admit date: 10/11/2011 Discharge date: 10/13/2011  Admission Diagnoses: Acute Bronchitis Respiratory distress Bilateral lower ext. Edema  Discharge Diagnoses:  Acute Bronchitis Respiratory distress Bilateral lower ext. Edema  Discharged Condition: improved  Hospital Course:  Assessment/Plan:  78 y/o female with improving lung function after treatment for acute bronchitis.   1. Acute bronchitis/respiratory distress  She believes that second hand smoke triggered this event. No signs of infection.  A CXR was normal.  Completed a 5-day steroid burst while in hospital.  Antibiotics were changed to Azithromycin. Given DuoNeb at discharge.  2. Edema/JVD/orthopnea/?right sided CHF  Patient has not had exacerbations of CHF, but is on lasix daily and has had a normal cardiac cath in Feb. 2013 by Dr. Rennis Golden.  Echo this admission showed EF of 60% and a normal right ventricle.   3. CKD3  Increased Creatinine  Lab Results   Component  Value  Date    CREATININE  2.19*  10/11/2011     Lab Results  Component Value Date   CREATININE 1.77* 10/12/2011  Improved Creatinine  Average baseline prior to admission was 1.6 average.  This is likely secondary to Prerenal azotemia.  Cr improved with gentle hydration.  4. AFIB - rate controlled  Coumadin per pharmacy  Lab Results   Component  Value  Date    INR  2.58*  10/12/2011    INR  1.89*  10/11/2011    INR  2.24*  09/07/2011                        Home meds of carvedilol, amlodipine, and coumadin were continued.  INR supra-therapeutic at discharge - she is not to take coumadin this evening.   5. HTN  Well controlled on home medications  BP Readings from Last 3 Encounters:   10/12/11  112/63   10/08/11  122/58   09/17/11  107/61               6. DM  Lab Results   Component  Value  Date    HGBA1C  6.6  07/29/2011   Was on SSI  while in hospital.  Discharge on home glipizide.  Consults: None  Significant Diagnostic Studies: X-Ray: chest: no significant findings.   Echo:  - Left ventricle: The cavity size was normal. Wall thickness was normal. Systolic function was normal. The estimated ejection fraction was in the range of 60% to 65%. Although no diagnostic regional wall motion abnormality was identified, this possibility cannot be completely excluded on the basis of this study. Atrial fibrillation limitsevaluation of LV diastolic function. - Ventricular septum: Septal motion showed paradox. - Mitral valve: Moderately to severely calcified, mildly thickened annulus. Mild regurgitation. - Left atrium: The atrium was moderately dilated. - Right atrium: The atrium was mildly dilated. - Atrial septum: No defect or patent foramen ovale was identified. - Pericardium, extracardiac: A small, free-flowing pericardial effusion was identified circumferential to the heart. The fluid had no internal echoes.    Treatments: IV hydration, antibiotics: azithromycin, anticoagulation: LMW heparin and warfarin, steroids: prednisone 50, insulin: sliding scale (because of steroid use) and respiratory therapy: albuterol/atropine nebulizer   Disposition: 01-Home or Self Care  Discharge Orders    Future Appointments: Provider: Department: Dept Phone: Center:   10/14/2011 11:00 AM Alvis Lemmings Wilson Singer, MD [facility] [phone] [facility]     Future Orders Please Complete By  Expires   Diet - low sodium heart healthy      Increase activity slowly      Call MD for:  temperature >100.4      Call MD for:      Comments:   Increasing shortness of breath.     Medication List  As of 10/13/2011  4:13 PM   STOP taking these medications         doxycycline 100 MG tablet      minocycline 100 MG capsule         TAKE these medications         albuterol 108 (90 BASE) MCG/ACT inhaler   Commonly known as: PROVENTIL HFA;VENTOLIN  HFA   Inhale 2 puffs into the lungs every 6 (six) hours as needed. For shortness of breath      albuterol (2.5 MG/3ML) 0.083% nebulizer solution   Commonly known as: PROVENTIL   Take 3 mLs (2.5 mg total) by nebulization every 6 (six) hours as needed for wheezing.      albuterol (5 MG/ML) 0.5% nebulizer solution   Commonly known as: PROVENTIL   Take 0.5 mLs (2.5 mg total) by nebulization every 4 (four) hours.      amLODipine 10 MG tablet   Commonly known as: NORVASC   Take 10 mg by mouth daily.      aspirin 81 MG chewable tablet   Chew 81 mg by mouth daily.      atorvastatin 80 MG tablet   Commonly known as: LIPITOR   Take 80 mg by mouth at bedtime.      azithromycin 250 MG tablet   Commonly known as: ZITHROMAX   Please take one tablet daily until all pills are gone.      Calcium 600+D 600-200 MG-UNIT Tabs   Generic drug: Calcium Carbonate-Vitamin D   Take 1 tablet by mouth daily.      carvedilol 6.25 MG tablet   Commonly known as: COREG   Take 6.25 mg by mouth 2 (two) times daily with a meal.      CINNAMON PO   Take 1 tablet by mouth daily.      Fish Oil 1000 MG Caps   Take 1 capsule by mouth 2 (two) times daily.      furosemide 20 MG tablet   Commonly known as: LASIX   Take 20 mg by mouth 2 (two) times daily.      gabapentin 300 MG capsule   Commonly known as: NEURONTIN   Take 300 mg by mouth 3 (three) times daily.      glipiZIDE 5 MG tablet   Commonly known as: GLUCOTROL   Take 5 mg by mouth daily.      ipratropium 0.02 % nebulizer solution   Commonly known as: ATROVENT   Take 2.5 mLs (0.5 mg total) by nebulization every 4 (four) hours.      losartan 50 MG tablet   Commonly known as: COZAAR   Take 50 mg by mouth daily.      omeprazole 20 MG capsule   Commonly known as: PRILOSEC   Take 20 mg by mouth daily as needed. For acid reflux      predniSONE 50 MG tablet   Commonly known as: DELTASONE   One tab remaining in five day course. Take one tab PO  daily.      PRESCRIPTION MEDICATION   Place 1 drop into both eyes daily. Eye drops from doctor for dry eyes  warfarin 5 MG tablet   Commonly known as: COUMADIN   Take 5-7.5 mg by mouth daily. Take 1.5 tablets (7.5mg ) on Mondays and Fridays Take 1 tablet (5 mg) all other days of the week.           Follow-up Information    Follow up with Ellin Mayhew, MD on 10/14/2011. (as scheduled at 11am)          Follow up issues 1. Please assess for continued respiratory improvement 2. Would consider outpatient referral to pulmonology as I do not have a good reason for her to be hypoxic with an acute bronchitis given normal PFTs in September.   SignedDespina Hick MD 10/13/2011, 4:13 PM

## 2011-10-12 NOTE — Progress Notes (Signed)
FMTS Attending Note  Patient seen and examined by me, discussed with resident team.  [name] appears somewhat improved from admission.  Still with notable shortness of breath.  She has had improvement in her WBC count since admission.   On exam, her ankle edema is improved from admission. No rales noted on her lung exam, still with some wheezing.   Assess/Plan: Pt admitted with dyspnea, found to have signs suggestive of R heart failure.  She has made modest improvement.  I agree with ECHO for today; would continue to diurese as well.  Continue azithromycin; wean off supplemental oxygen.  Still needs to make more improvement prior to discharge.  Paula Compton, MD

## 2011-10-12 NOTE — Progress Notes (Signed)
  Echocardiogram 2D Echocardiogram has been performed.  [name] A 10/12/2011, 11:26 AM

## 2011-10-13 LAB — GLUCOSE, CAPILLARY
Glucose-Capillary: 257 mg/dL — ABNORMAL HIGH (ref 70–99)
Glucose-Capillary: 350 mg/dL — ABNORMAL HIGH (ref 70–99)

## 2011-10-13 MED ORDER — FUROSEMIDE 40 MG PO TABS
40.0000 mg | ORAL_TABLET | Freq: Every day | ORAL | Status: DC
  Filled 2011-10-13: qty 1

## 2011-10-13 MED ORDER — AZITHROMYCIN 250 MG PO TABS
250.0000 mg | ORAL_TABLET | Freq: Every day | ORAL | Status: DC
  Administered 2011-10-13: 250 mg via ORAL
  Filled 2011-10-13: qty 1

## 2011-10-13 MED ORDER — ALBUTEROL SULFATE (5 MG/ML) 0.5% IN NEBU: 2.5000 mg | INHALATION_SOLUTION | RESPIRATORY_TRACT | Status: DC

## 2011-10-13 MED ORDER — IPRATROPIUM BROMIDE 0.02 % IN SOLN: 0.5000 mg | RESPIRATORY_TRACT | Status: DC

## 2011-10-13 MED ORDER — AZITHROMYCIN 250 MG PO TABS: ORAL_TABLET | ORAL | Status: AC

## 2011-10-13 NOTE — Discharge Summary (Signed)
I have reviewed this discharge summary and agree.    

## 2011-10-13 NOTE — Progress Notes (Signed)
Walked patient again per MD request O2 started @88  on room air laying in the bed with a couple of breaths went up to 94%. Started walking and O2 was between 91-96% on the way back to the room again O2 dropped to 89%. When sitting on the bed took 3 min for O2 to come back up to 92%.

## 2011-10-13 NOTE — Progress Notes (Signed)
Utilization review complete 

## 2011-10-13 NOTE — Discharge Instructions (Signed)
You were admitted for a bronchitis.    You completed a course of steroids in the hospital so you do NOT need to take anymore prednisone at home.  You will need to complete a course of Azithromycin, the last dose will be on 4/17.  I have also sent a prescription for the DuoNeb solution to your pharmacy.  Please use these every 4-6hrs as needed.    If you develop fevers or your breathing is getting worse, please call the clinic [phone]) or return to the ER. Bronchitis Bronchitis is the body's way of reacting to injury and/or infection (inflammation) of the bronchi. Bronchi are the air tubes that extend from the windpipe into the lungs. If the inflammation becomes severe, it may cause shortness of breath. CAUSES  Inflammation may be caused by:  A virus.   Germs (bacteria).   Dust.   Allergens.   Pollutants and many other irritants.  The cells lining the bronchial tree are covered with tiny hairs (cilia). These constantly beat upward, away from the lungs, toward the mouth. This keeps the lungs free of pollutants. When these cells become too irritated and are unable to do their job, mucus begins to develop. This causes the characteristic cough of bronchitis. The cough clears the lungs when the cilia are unable to do their job. Without either of these protective mechanisms, the mucus would settle in the lungs. Then you would develop pneumonia. Smoking is a common cause of bronchitis and can contribute to pneumonia. Stopping this habit is the single most important thing you can do to help yourself. TREATMENT   Your caregiver may prescribe an antibiotic if the cough is caused by bacteria. Also, medicines that open up your airways make it easier to breathe. Your caregiver may also recommend or prescribe an expectorant. It will loosen the mucus to be coughed up. Only take over-the-counter or prescription medicines for pain, discomfort, or fever as directed by your caregiver.   Removing whatever  causes the problem (smoking, for example) is critical to preventing the problem from getting worse.   Cough suppressants may be prescribed for relief of cough symptoms.   Inhaled medicines may be prescribed to help with symptoms now and to help prevent problems from returning.   For those with recurrent (chronic) bronchitis, there may be a need for steroid medicines.  SEEK IMMEDIATE MEDICAL CARE IF:   During treatment, you develop more pus-like mucus (purulent sputum).   You have a fever.   Your baby is older than 3 months with a rectal temperature of 102 F (38.9 C) or higher.   Your baby is 90 months old or younger with a rectal temperature of 100.4 F (38 C) or higher.   You become progressively more ill.   You have increased difficulty breathing, wheezing, or shortness of breath.  It is necessary to seek immediate medical care if you are elderly or sick from any other disease. MAKE SURE YOU:   Understand these instructions.   Will watch your condition.   Will get help right away if you are not doing well or get worse.  Document Released: 06/16/2005 Document Revised: 06/05/2011 Document Reviewed: 04/25/2008 [facility] Patient Information 2012 [place], [place].

## 2011-10-13 NOTE — Progress Notes (Signed)
 Subjective: Doing well this morning.  Walked off oxygen this morning with drop to 88%, returned to 96% on 1.5L.  Does get a little SOB with walking, otherwise doing well.  Objective: Vital signs in last 24 hours: Temp:  [97.9 F (36.6 C)-98.4 F (36.9 C)] 97.9 F (36.6 C) (04/15 0500) Pulse Rate:  [66-82] 73  (04/15 0500) Resp:  [18-20] 18  (04/15 0500) BP: (108-126)/(48-66) 120/48 mmHg (04/15 0500) SpO2:  [91 %-98 %] 94 % (04/15 0912) Weight:  [172 lb 2.9 oz (78.1 kg)] 172 lb 2.9 oz (78.1 kg) (04/15 0500) Weight change: 2 lb 9.3 oz (1.17 kg) Last BM Date: 10/12/11  Intake/Output from previous day: 04/14 0701 - 04/15 0700 In: 1093 [P.O.:640; I.V.:3] Out: 800 [Urine:800] Intake/Output this shift: Total I/O In: -  Out: 200 [Urine:200] PE: General: alert, cooperative, NAD Neck: no JVD Lungs:  Normal respiratory effort, chest expands symmetrically. Minimal wheezing bibasilar, no crackles. Heart - irregularly irregular, normal rate.  No murmurs, gallops or rubs.    Skin:  Intact without suspicious lesions or rashes Extremities:  Non-tender, No cyanosis, edema, or deformity noted.  Lab Results:  Lab 10/12/11 0002 10/11/11 0820  WBC 6.6 12.4*  HGB 11.1* 11.9*  HCT 33.8* 36.5  PLT 200 217    Lab 10/12/11 0002 10/11/11 1620 10/11/11 0820  NA 138 137 139  K 4.0 3.9 3.7  CL 104 103 103  CO2 24 20 23   BUN 70* 72* 77*  CREATININE 1.77* 1.93* 2.19*  LABGLOM -- -- --  GLUCOSE 214* -- --  CALCIUM 9.4 9.3 9.8   INR 3.89  ECHO - Left ventricle: The cavity size was normal. Wall thickness was normal. Systolic function was normal. The estimated ejection fraction was in the range of 60% to 65%. Although no diagnostic regional wall motion abnormality was identified, this possibility cannot be completely excluded on the basis of this study. Atrial fibrillation limitsevaluation of LV diastolic function. - Ventricular septum: Septal motion showed paradox. - Mitral valve:  Moderately to severely calcified, mildly thickened annulus. Mild regurgitation. - Left atrium: The atrium was moderately dilated. - Right atrium: The atrium was mildly dilated. - Atrial septum: No defect or patent foramen ovale was identified. - Pericardium, extracardiac: A small, free-flowing pericardial effusion was identified circumferential to the heart. The fluid had no internal echoes.   Medications: I have reviewed the patient's current medications.  Assessment/Plan: 78 y/o female with improving lung function after treatment for acute bronchitis and ?right sided heart failure.   1. Acute bronchitis/respiratory distress   - d/c Prednisone as has had a 6-day course  - change azithromycin to PO - Combivent Nebs q 4 hours  - O2 PRN  - will have pt walk again this afternoon  2. Edema/JVD/orthopnea/right sided CHF: echo is normal - change lasix to PO   3. CKD3: trending down; 1.77 yesterday, baseline around 1.6   4. AFIB - rate controlled  - Coumadin per pharmacy  - continue amlodipine and carvedilol  5. HTN  - continue home meds  6. DM  - holding glipizide - SSI while on steroids  FENGI/PROPH: coumadin per pharmacy and Lovenox till therapeutic. Heart Diet. Saline lock IV.  Dispo: Observation and respiratory treatment. Discharge pending no O2 requirement    LOS: 2 days   BOOTH,  MD 10/13/2011, 10:15 AM

## 2011-10-13 NOTE — Progress Notes (Signed)
Family Medicine Teaching Service Attending Note  I interviewed and examined patient [name] and reviewed their tests and x-rays.  I discussed with Dr. Elwyn Reach and reviewed their note for today.  I agree with their assessment and plan.     Additionally  Feels improved Echo noted Unsure of reason for her dyspnea and mild hypoxia.   Need to determine if this is chronic.   Ok to discharge if sats > 88% but may need to consult Pulmonary if her symptoms persist

## 2011-10-13 NOTE — Progress Notes (Signed)
Measured pts O2 level on room air 88%. When she took two deep breaths went back up to 91%. Walked to the bathroom on room air 94% then walked in hallway 10ft 93%. On the way back to the room, sats dropped to 88% put pt back on 1.5L of O2 sats went up to 96%.

## 2011-10-13 NOTE — Progress Notes (Signed)
ANTICOAGULATION CONSULT NOTE - Follow Up Consult  Pharmacy Consult for Warfarin Indication: atrial fibrillation  No Known Allergies  Patient Measurements: Height: 5\' 7"  (170.2 cm) Weight: 172 lb 2.9 oz (78.1 kg) IBW/kg (Calculated) : 61.6   Vital Signs: Temp: 97.7 F (36.5 C) (04/15 1415) Temp src: Oral (04/15 1415) BP: 129/70 mmHg (04/15 1415) Pulse Rate: 82  (04/15 1415)  Labs:  Basename 10/13/11 0620 10/12/11 0003 10/12/11 0002 10/11/11 1620 10/11/11 0820  HGB -- -- 11.1* -- 11.9*  HCT -- -- 33.8* -- 36.5  PLT -- -- 200 -- 217  APTT -- -- -- -- --  LABPROT 38.7* -- 28.1* -- 22.0*  INR 3.89* -- 2.58* -- 1.89*  HEPARINUNFRC -- -- -- -- --  CREATININE -- -- 1.77* 1.93* 2.19*  CKTOTAL -- 104 -- 164 --  CKMB -- 8.8* -- 12.0* --  TROPONINI -- <0.30 -- <0.30 --   Estimated Creatinine Clearance: 26.8 ml/min (by C-G formula based on Cr of 1.77).   Assessment: 78 y.o. F on warfarin for hx Afib with a SUPRAtherapeutic INR this morning (INR 3.89 << 2.58, goal of 2-3). Large elevations in INR may be related to use of IV/po steroids this admission which are known to increase warfarin sensitivity. Hgb/Hct/Plt stable. No s/sx of bleeding noted. The patient was re-educated on warfarin today.   Goal of Therapy:  INR 2-3   Plan:  1. Hold warfarin dose today 2. Will continue to monitor for any signs/symptoms of bleeding and will follow up with PT/INR in the a.m.   Georgina Pillion, PharmD, BCPS Clinical Pharmacist Pager: [phone] 10/13/2011 2:43 PM

## 2011-10-14 ENCOUNTER — Encounter: Payer: Self-pay | Admitting: Family Medicine

## 2011-10-14 ENCOUNTER — Telehealth: Payer: Self-pay | Admitting: Family Medicine

## 2011-10-14 ENCOUNTER — Ambulatory Visit (INDEPENDENT_AMBULATORY_CARE_PROVIDER_SITE_OTHER): Payer: PRIVATE HEALTH INSURANCE | Admitting: Family Medicine

## 2011-10-14 VITALS — BP 105/60 | HR 61 | Temp 98.1°F | Ht 67.0 in | Wt 168.0 lb

## 2011-10-14 DIAGNOSIS — E119 Type 2 diabetes mellitus without complications: Secondary | ICD-10-CM

## 2011-10-14 DIAGNOSIS — J449 Chronic obstructive pulmonary disease, unspecified: Secondary | ICD-10-CM

## 2011-10-14 DIAGNOSIS — J40 Bronchitis, not specified as acute or chronic: Secondary | ICD-10-CM

## 2011-10-14 DIAGNOSIS — J4489 Other specified chronic obstructive pulmonary disease: Secondary | ICD-10-CM

## 2011-10-14 MED ORDER — ALBUTEROL SULFATE (2.5 MG/3ML) 0.083% IN NEBU
2.5000 mg | INHALATION_SOLUTION | Freq: Once | RESPIRATORY_TRACT | Status: AC
  Administered 2011-10-14: 2.5 mg via RESPIRATORY_TRACT

## 2011-10-14 NOTE — Patient Instructions (Signed)
Stop taking doxycycline.--Do not buy this prescription- The pharmacist told me that you still have a prescription for this at the pharmacy.   What I want to take is: 1) your regular medicines 2)Azithromycin- need to pick this up 3) get albuterol and ipratropium   Return for recheck in 1-2 week-

## 2011-10-14 NOTE — Telephone Encounter (Signed)
Advised pt of [facility] calling to set up pulse ox for pt overnight, per Dr Edmonia [name].

## 2011-10-14 NOTE — Assessment & Plan Note (Addendum)
Dx with bronchitis and discharged yesterday on azithromycin.  Clarified all of pts questions about medications and what she should be taken.  Given another med list (lost last one).  Called the pharmacy to clarify what the communication problem was- Pt was trying to pick up doxycycline rx that had been discontinued-  Instructed pt not to pick this one up-- I canceled this rx over the phone with the pharmacy. Pt to go directly to pharmacy to purchase azithromycin, albuterol, and ipratropium.  Pt to also continue other regular scheduled medications. Gave pt neb treatment today since she thought that this would help the wheezing some.  Very minimal wheezing on exam but agreed to give this to her today. Return in 1-2 weeks for recheck.   Greater than 50% of face time spent in counseling patient.

## 2011-10-14 NOTE — Progress Notes (Signed)
  Subjective:    Patient ID: [name], female    DOB: 03/17/1928, 78 y.o.   MRN: [id number]  HPI Problem with prescription: Lots of confusion, had medication called to [facility] on [address] at time of discharge- first told her she could come in to get it, when she got there they told her that they are out of it.  Not sure of the name of the medicine but thinks that it is an antibiotic. States she would like me to call and have rx transferred to other pharmacy.   Bronchitis: + wheezing off and on. D/c'd yesterday from hospital.  Felt well when woke up this am.  But increased wheezing and not feeling as good now. Requesting a breathing treatment. Got upset because couldnot find keys before leaving home and these seemed to make breathing worse. + continued cough.  No fever. Some sob off and on. No n/v.  No abd pain.  No blood in sputum.  Yellow colored sputum.  Runny nose, now improved.   Diabetes: - elevated BG in hospital, used insulin, but restarted back on glipizide at time of d/c from hospital.  BG this am was 195.      Review of Systems As per above.     Objective:   Physical Exam  Constitutional: She appears well-developed and well-nourished.  HENT:  Head: Normocephalic and atraumatic.  Neck: Neck supple.  Cardiovascular: Normal rate and normal heart sounds.   No murmur heard.      irregular  Pulmonary/Chest: Effort normal. No respiratory distress. She has wheezes (few scattered wheezes anteriorly, clear bilateral on posterior exam,). She has no rales. She exhibits no tenderness.  Abdominal: Soft. She exhibits no distension. There is no tenderness. There is no rebound and no guarding.  Musculoskeletal: She exhibits edema (trace).       Normal gait  Neurological: She is alert.  Skin: Skin is warm. No rash noted.  Psychiatric:       Tearful talking about how frustrated she is with pharmacy          Assessment & Plan:

## 2011-10-14 NOTE — Assessment & Plan Note (Signed)
Pt to continue to monitor this at home.  I suspect that now that she is done with steroids that this will trend down.  Pt to call me if any persistent elevations.

## 2011-10-23 ENCOUNTER — Telehealth: Payer: Self-pay | Admitting: *Deleted

## 2011-10-23 NOTE — Telephone Encounter (Signed)
[facility] did overnight O2 on pt and walked in clinic with report.  I had Dr.McDiarmid sign it. They will supply her with the oxygen. See form in your box.[name]

## 2011-10-30 ENCOUNTER — Telehealth: Payer: Self-pay | Admitting: Family Medicine

## 2011-10-30 ENCOUNTER — Ambulatory Visit: Payer: PRIVATE HEALTH INSURANCE | Admitting: Family Medicine

## 2011-10-30 NOTE — Telephone Encounter (Signed)
Patient reports  has problems with nerves in her feet.  Now her  Right foot is hurting on top up into shin area. Gaylyn Rong been hurting for a week.  She had appointment with Dr. Edmonia [name] this AM and cancelled because the bronchitis is better. Appointment scheduled tomorrow. Will forward message also to Dr. Edmonia [name] . She is in clinic today.

## 2011-10-30 NOTE — Telephone Encounter (Signed)
Is having problems with her feet and wants to know if she can get her meds increased

## 2011-10-31 ENCOUNTER — Encounter: Payer: Self-pay | Admitting: Family Medicine

## 2011-10-31 ENCOUNTER — Ambulatory Visit (INDEPENDENT_AMBULATORY_CARE_PROVIDER_SITE_OTHER): Payer: PRIVATE HEALTH INSURANCE | Admitting: Family Medicine

## 2011-10-31 VITALS — BP 112/70 | HR 72 | Temp 97.7°F | Ht 67.0 in | Wt 171.9 lb

## 2011-10-31 DIAGNOSIS — G609 Hereditary and idiopathic neuropathy, unspecified: Secondary | ICD-10-CM

## 2011-10-31 MED ORDER — CAPSAICIN-MENTHOL-METHYL SAL 0.05-7-20 % EX OINT: 1.0000 "application " | TOPICAL_OINTMENT | Freq: Three times a day (TID) | CUTANEOUS | Status: DC

## 2011-10-31 MED ORDER — GABAPENTIN 300 MG PO CAPS: 600.0000 mg | ORAL_CAPSULE | Freq: Three times a day (TID) | ORAL | Status: DC

## 2011-10-31 NOTE — Patient Instructions (Signed)
Meds ordered this encounter  Medications  . gabapentin (NEURONTIN) 300 MG capsule    Sig: Take 2 capsules (600 mg total) by mouth 3 (three) times daily.    Dispense:  180 capsule    Refill:  6  . Capsaicin-Menthol-Methyl Sal 0.11-04-18 % OINT    Sig: Apply 1 application topically 3 (three) times daily.    Dispense:  120 g    Refill:  3   Please schedule an appointment for your complete annual exam.

## 2011-10-31 NOTE — Progress Notes (Signed)
  Subjective:   Patient ID: [name], female DOB: Apr 28, 1928 78 y.o. MRN: [id number] HPI:  1. Neuropathic pain dorsal aspect of right foot.  Onset: has been recurrent  Time period of: 1 week(s).  Severity is described as moderate to severe. 7/10 Course of her symptoms over time is recurrent. Aggravating: walking on it.  Alleviating: none. Gabapentin not helping.  Associated sx/sn: shooting pain from top of foot up leg. She has no fever, hx of arthritis in the right ankle, but this is separate.   Tobacco use: Patient is a non smoker.   Review of Systems Pertinent items are noted in HPI.    Objective:   Filed Vitals:   10/31/11 1015  BP: 112/70  Pulse: 72  Temp: 97.7 F (36.5 C)  TempSrc: Oral  Height: 5\' 7"  (1.702 m)  Weight: 171 lb 14.4 oz (77.973 kg)   Physical Exam: General: c/f, nad, sitting in chair, pleasant.  Right foot: normal appearance and temperature. No TTP. Full AROM at the ankle. Pulses intact. Sensation intact.   Assessment & Plan:

## 2011-10-31 NOTE — Assessment & Plan Note (Signed)
Increased gabapentin dose. rx capsaicin cream.

## 2011-11-03 ENCOUNTER — Telehealth: Payer: Self-pay | Admitting: Family Medicine

## 2011-11-03 NOTE — Telephone Encounter (Signed)
Please forward this to Dr. Edmonia [name].

## 2011-11-03 NOTE — Telephone Encounter (Signed)
[name] called to say that she needed something called in for her foot pain or to tell her what she can use otc .  She said she's taking 6 pills daily, but with no relief.  Please let her know as soon as possible.  She is unable to put pressure on foot.

## 2011-11-04 NOTE — Telephone Encounter (Signed)
If the medication that Dr. Rivka Safer prescribed is not helping and pain is getting worse- I recommend she come back in to let me take another look at it.  There may be something new or different going on.  There really isn't anything OTC that is more powerful than gabapentin for neuropathy pain.  Please have pt schedule appointment with me.  Thanks, [employer]

## 2011-11-10 ENCOUNTER — Encounter: Payer: Self-pay | Admitting: Family Medicine

## 2011-11-10 DIAGNOSIS — N2581 Secondary hyperparathyroidism of renal origin: Secondary | ICD-10-CM | POA: Insufficient documentation

## 2011-11-10 DIAGNOSIS — Z862 Personal history of diseases of the blood and blood-forming organs and certain disorders involving the immune mechanism: Secondary | ICD-10-CM | POA: Insufficient documentation

## 2011-11-11 ENCOUNTER — Ambulatory Visit (INDEPENDENT_AMBULATORY_CARE_PROVIDER_SITE_OTHER): Payer: PRIVATE HEALTH INSURANCE | Admitting: Family Medicine

## 2011-11-11 ENCOUNTER — Encounter: Payer: Self-pay | Admitting: Family Medicine

## 2011-11-11 ENCOUNTER — Telehealth: Payer: Self-pay | Admitting: Family Medicine

## 2011-11-11 VITALS — BP 107/67 | HR 80 | Ht 67.0 in | Wt 163.0 lb

## 2011-11-11 DIAGNOSIS — E119 Type 2 diabetes mellitus without complications: Secondary | ICD-10-CM

## 2011-11-11 DIAGNOSIS — M79609 Pain in unspecified limb: Secondary | ICD-10-CM

## 2011-11-11 DIAGNOSIS — M79671 Pain in right foot: Secondary | ICD-10-CM

## 2011-11-11 LAB — BASIC METABOLIC PANEL
BUN: 27 mg/dL — ABNORMAL HIGH (ref 6–23)
Calcium: 9.8 mg/dL (ref 8.4–10.5)
Creat: 1.44 mg/dL — ABNORMAL HIGH (ref 0.50–1.10)

## 2011-11-11 LAB — POCT GLYCOSYLATED HEMOGLOBIN (HGB A1C): Hemoglobin A1C: 6.4

## 2011-11-11 NOTE — Telephone Encounter (Signed)
Per Dr Edmonia [name] pt advised she may D/C her glipizide. Dr Edmonia [name] reviewed her BS readings she brought in.

## 2011-11-11 NOTE — Patient Instructions (Signed)
Right foot pain: See Dr. Clovis Riley-- she may want to get x rays to further evaluate.  I think that this would be a good idea since this is a new pain and different.   Let her know about the amitriptyline that your renal doctors started.    Return in July for regular check up.

## 2011-11-11 NOTE — Progress Notes (Signed)
  Subjective:    Patient ID: [name], female    DOB: 1927/12/14, 78 y.o.   MRN: [id number]  HPI Right foot pain: Pain on back of foot.  X 3 weeks.  Sharp, aching-- comes and goes.   Improves with rest.  Walking/exercise makes it worse.  Putting left foot under arch seems to make better.  Touching/massage makes it better.  Doesn't feel like typical neuropathy pain. No numbness or tingling like she has had in her toes in the past. Was initially started by Dr. Rivka Safer on gabapentin when she was initially evaluated for this. This was DC'd by renal doctors in the setting of her renal insufficiency and she was started on amitriptyline. Patient states that she plans to go see her orthopedist tomorrow for further evaluation. But wanted to come today for my opinion. No fever. No redness of foot. No swelling of foot.  Has never had this type of symptom in the past. No known trauma to the area.  Smoking status reviewed.  Review of Systems As per above.    Objective:   Physical Exam  Constitutional: She appears well-developed.  HENT:  Head: Normocephalic and atraumatic.  Cardiovascular: Normal rate.   Pulmonary/Chest: Effort normal. No respiratory distress.  Musculoskeletal: She exhibits no edema.       Right foot exam: Normal arch.  Normal appearance. No redness or swelling. Gait- short strides, limited movement in ankle with ambulation No pain with ROM, joint stable.  Minimal pain on palpation of posterior foot area- near ankle joint.   Normal sensation overall- but diminished slightly in great toes bilateral.            Assessment & Plan:

## 2011-11-11 NOTE — Telephone Encounter (Signed)
[name] wanted to know if you gave the sheet with her A1C on it to Dr. Edmonia [name] and if Dr. Edmonia [name] want her to continue not taking her glipizide after seeing the numbers.  Please contact her back to day asap.

## 2011-11-11 NOTE — Telephone Encounter (Signed)
Pt is calling again to see if she needs to continue with the Glipizide

## 2011-11-12 DIAGNOSIS — M79671 Pain in right foot: Secondary | ICD-10-CM | POA: Insufficient documentation

## 2011-11-12 NOTE — Assessment & Plan Note (Signed)
etilogy unclear- atypical presentation for neuropathy pain- new pain x 3 weeks.  Pt feels that this pain is limiting her function.  Since no improvement- would like to obtain x ray of right foot/ankle to r/o bony abnormality.  Pt states that she is going to orthopedist tomorrow for evaluation and will have xray of ankle done there.  Also, ankle movement limited- and has "stiff apperance" on ambulation- this is present bilateral and appear to be chronic.  Could abnormal gait be putting strain on certain areas of foot causing pain?  Could limited ankle movement and stiffness be secondary to deconditioning?   Could consider PT evaluation and treatment to see if this improves pain. Renal d/c'd neurotin and placed pt on amitriptyline- could titrate this up as needed.  First, pt to go as she is scheduled for orthopedic appt- have asked that films and office note be sent to me for my review.  Appreciate ortho's input.

## 2011-11-12 NOTE — Telephone Encounter (Signed)
Forward to PCP, patient has abnormal labs.

## 2011-11-12 NOTE — Telephone Encounter (Signed)
Patient is calling for results of lab work.

## 2011-11-13 NOTE — Telephone Encounter (Signed)
Patient is calling back about her labs.

## 2011-11-13 NOTE — Telephone Encounter (Signed)
Called pt and answered her questions 

## 2011-11-13 NOTE — Telephone Encounter (Signed)
Patient wants to speak to Dr. Edmonia [name] about her lab work.

## 2011-12-05 ENCOUNTER — Encounter: Payer: PRIVATE HEALTH INSURANCE | Admitting: Family Medicine

## 2011-12-19 ENCOUNTER — Encounter: Payer: Self-pay | Admitting: Family Medicine

## 2011-12-19 ENCOUNTER — Ambulatory Visit (INDEPENDENT_AMBULATORY_CARE_PROVIDER_SITE_OTHER): Payer: PRIVATE HEALTH INSURANCE | Admitting: Family Medicine

## 2011-12-19 VITALS — BP 134/74 | HR 82 | Ht 67.0 in | Wt 162.8 lb

## 2011-12-19 DIAGNOSIS — I4891 Unspecified atrial fibrillation: Secondary | ICD-10-CM

## 2011-12-19 DIAGNOSIS — E785 Hyperlipidemia, unspecified: Secondary | ICD-10-CM

## 2011-12-19 DIAGNOSIS — D4959 Neoplasm of unspecified behavior of other genitourinary organ: Secondary | ICD-10-CM

## 2011-12-19 DIAGNOSIS — E119 Type 2 diabetes mellitus without complications: Secondary | ICD-10-CM

## 2011-12-19 LAB — LIPID PANEL
Cholesterol: 173 mg/dL (ref 0–200)
HDL: 44 mg/dL (ref >39)
Triglycerides: 173 mg/dL — ABNORMAL HIGH (ref <150)

## 2011-12-19 LAB — COMPREHENSIVE METABOLIC PANEL
ALT: 11 U/L (ref 0–35)
BUN: 27 mg/dL — ABNORMAL HIGH (ref 6–23)
CO2: 26 mEq/L (ref 19–32)
Calcium: 9.7 mg/dL (ref 8.4–10.5)
Chloride: 107 mEq/L (ref 96–112)
Creat: 1.47 mg/dL — ABNORMAL HIGH (ref 0.50–1.10)
Glucose, Bld: 99 mg/dL (ref 70–99)
Total Bilirubin: 1.2 mg/dL (ref 0.3–1.2)

## 2011-12-19 NOTE — Patient Instructions (Addendum)
Overall wellness: Need to walk daily.  Continue to work on [employer].   I will call you with lab results.   Return in 3-6 months for recheck.

## 2011-12-19 NOTE — Progress Notes (Signed)
  Subjective:    Patient ID: [name], female    DOB: 05/08/1928, 78 y.o.   MRN: [id number]  HPI Patient here for regular followup:  Patient would like yearly labs drawn: Patient due for lipid panel and liver enzymes. Patient requested renal function be drawn-although this was done about 2-3 weeks ago at renal office.  Diabetes: Patient states she is taking Glucotrol again-states that she had some elevations that scare her-that are in the high 100s- when she goes off of it. Therefore would like to continue to take Glucotrol.denies any signs or symptoms of hypoglycemia. No rash. No shakiness. No syncope. Tried to eat a healthy diet. Not doing much activity-no walking regimen currently.  Weight management: Patient is not walking daily. No daily activity.  Renal insufficiency: Being followed by Dr. Aretta Nip at [facility].  Patient states that he was pleased her last creatinine level. He is aware that cardiologist has started patient on Lasix twice a day.-Per patient. No changes made recently to medication regimen-by renal M.D.  Smoking status reviewed.  Review of Systems as per above   Objective:   Physical Exam  Constitutional: She appears well-developed and well-nourished.  HENT:  Head: Normocephalic and atraumatic.  Eyes: Right eye exhibits no discharge. Left eye exhibits no discharge.  Neck: Normal range of motion.  Cardiovascular: Normal rate, regular rhythm and normal heart sounds.   No murmur heard. Pulmonary/Chest: Effort normal and breath sounds normal. No respiratory distress. She has no wheezes.  Abdominal: Soft. She exhibits no distension.  Musculoskeletal: She exhibits no edema.  Neurological: She is alert.  Skin: No rash noted.  Psychiatric: She has a normal mood and affect.          Assessment & Plan:

## 2011-12-21 NOTE — Assessment & Plan Note (Signed)
Will recheck A1C in august 2013.  Was 6.4 in May 2013.  Pt restarted glucotrol after having some elevated bg levels.  No signs of hypoglycemia.  Pt to continue to monitor BG at home.  Return in 3 months for A1C recheck. encouraged patient to eat healthy- gave plate method handout- also encouraged patient to walk daily.

## 2011-12-21 NOTE — Assessment & Plan Note (Addendum)
Creatinine has been stable. Stable at last check 3 weeks ago at renal md.  Pt requesting another creatine level today with other lab draw.

## 2011-12-22 ENCOUNTER — Telehealth: Payer: Self-pay | Admitting: Family Medicine

## 2011-12-22 NOTE — Telephone Encounter (Signed)
Left message. Waiting for call back. [name], [name]

## 2011-12-22 NOTE — Telephone Encounter (Signed)
Patient is calling to get her results.  She also wants to speak to the nurse about Blood Sugars.

## 2011-12-24 ENCOUNTER — Telehealth: Payer: Self-pay | Admitting: Family Medicine

## 2011-12-24 NOTE — Telephone Encounter (Signed)
Will forward to Dr Edmonia [name]. Labs printed and mailed.

## 2011-12-24 NOTE — Telephone Encounter (Signed)
Pt is asking to have oxygen removed from home - needs orders for [facility]  Also is asking to have her labs sent to her.  Also wants it for her husband, [name].

## 2011-12-26 NOTE — Telephone Encounter (Signed)
Can you please call patient and ask patient if it was her or her husband that had the order for the oxygen?  Also, why was the oxygen prescribed in the first place- for what diagnosis?

## 2011-12-29 ENCOUNTER — Telehealth: Payer: Self-pay | Admitting: *Deleted

## 2011-12-29 NOTE — Telephone Encounter (Signed)
Pt called re: labs. I went over lab reports with the pt. Pt wants her O2 removed from her home. She does not need it. [employer] # [id number] is the provider of the O2 and they told her, that the PCP has to get in contact with them in order to have it removed. Fwd. To Dr.Caviness.  [name], [name]

## 2011-12-29 NOTE — Telephone Encounter (Signed)
See next note, please. [name], [name]

## 2011-12-30 NOTE — Telephone Encounter (Signed)
rx faxed to home health company to d/c o2.  Pulse ox 93% at visit in April 2013.

## 2012-01-16 ENCOUNTER — Other Ambulatory Visit: Payer: Self-pay | Admitting: *Deleted

## 2012-01-16 MED ORDER — ATORVASTATIN CALCIUM 80 MG PO TABS: 80.0000 mg | ORAL_TABLET | Freq: Every day | ORAL | Status: DC

## 2012-02-12 ENCOUNTER — Telehealth: Payer: Self-pay | Admitting: Family Medicine

## 2012-02-12 NOTE — Telephone Encounter (Signed)
Spoke with patient and she states yesterday had soreness on side of neck  but today not sore. Denies any swelling now when asked. Denies fever. Advised patient that if becomes sore again should come in to have evaluated. She wants to know what could be wrong . Explained that sometimes lymph nodes can swell if body is trying to fight off an infection or inflammation but she would need evaluation by MD to determine what is going on. She will watch and call back if acts up again.

## 2012-02-12 NOTE — Telephone Encounter (Signed)
Patient is calling wishing to speak to a nurse about a soreness on the side of her neck, under her ear.  She said that it stared yesterday and she thinks it is swollen.  She would like advice for home treatment or how to determine if she needs to be seen.

## 2012-02-18 ENCOUNTER — Ambulatory Visit (INDEPENDENT_AMBULATORY_CARE_PROVIDER_SITE_OTHER): Payer: PRIVATE HEALTH INSURANCE | Admitting: Family Medicine

## 2012-02-18 ENCOUNTER — Encounter: Payer: Self-pay | Admitting: Family Medicine

## 2012-02-18 VITALS — BP 127/69 | HR 79 | Ht 67.0 in | Wt 160.5 lb

## 2012-02-18 DIAGNOSIS — E119 Type 2 diabetes mellitus without complications: Secondary | ICD-10-CM

## 2012-02-18 LAB — POCT GLYCOSYLATED HEMOGLOBIN (HGB A1C): Hemoglobin A1C: 6.9

## 2012-02-18 NOTE — Assessment & Plan Note (Signed)
A1C today is 6.9 and the patient has not been taking her glipizide for 2 weeks and also has a dose of steroids for foot pain. I suspect that 5 mg daily is sufficient as long as she is compliant. F/u in 3 mos.

## 2012-02-18 NOTE — Patient Instructions (Addendum)
Dear [name],   Thank you for coming to clinic today. Please read below regarding the issues that we discussed.   1. Pain in your neck - It's likely a strain of your trapezius muscle that will get better with time. Please take tylenol as needed for any neck pain. Also, try a warm compress.   2. Diabetes - Your A1C today is 6.9. Good job. Please continue taking glipizide 5 mg daily. We will recheck 3 months.    Please follow up in clinic in 3 months. Please call earlier if you have any questions or concerns.   Sincerely,   Dr. Clinton Sawyer

## 2012-02-18 NOTE — Progress Notes (Signed)
  Subjective:    Patient ID: [name], female    DOB: 05-Feb-1928, 78 y.o.   MRN: [id number]  HPI  DIABETES  Taking and tolerating: not taking glipizide since previous A1C was 6.4 in 5/13  Fasting blood sugars: 120-135 Hypoglycemic symptoms: no Diet Changes: n ochanges Exercise: no Visual problems: no Monitoring feet: yes Numbness/Tingling: yes - persistent for years  Last eye exam: upcoming eye appointment 02/24/12 - [facility]abetic Labs:  Lab Results  Component Value Date   HGBA1C 6.4 11/11/2011   HGBA1C 6.6 07/29/2011   HGBA1C 6.8 03/11/2011   Lab Results  Component Value Date   LDLCALC 94 12/19/2011   CREATININE 1.47* 12/19/2011   Last microalbumin: No results found for this basename: MICROALBUR, [id number]   Taking an ACE-I ?: no has CKD stage II-III   Review of Systems Positive for left neck pain; Otherwise negative     Objective:   Physical Exam  BP 127/69  Pulse 79  Ht 5\' 7"  (1.702 m)  Wt 160 lb 8 oz (72.802 kg)  BMI 25.14 kg/m2  SpO2 93% Gen: alert, oriented, elderly female, very pleasant Feet: no ulcers, normal sensation to monofilament bilaterally  CV: irregularly irregular  Lungs: CTA-B, no wheezes Neck: no deformity, normal ROM, no TTP, negative Spurling's sign  Lab Results  Component Value Date   HGBA1C 6.4 11/11/2011   HGBA1C 6.6 07/29/2011   HGBA1C 6.8 03/11/2011   HGBA1C 6.2 12/10/2010   HGBA1C 6.0 07/09/2010       Assessment & Plan:  78 year old F with well controlled DM type 2.

## 2012-02-24 ENCOUNTER — Telehealth: Payer: Self-pay | Admitting: Family Medicine

## 2012-02-24 DIAGNOSIS — J449 Chronic obstructive pulmonary disease, unspecified: Secondary | ICD-10-CM

## 2012-02-24 MED ORDER — ALBUTEROL SULFATE HFA 108 (90 BASE) MCG/ACT IN AERS: 2.0000 | INHALATION_SPRAY | Freq: Four times a day (QID) | RESPIRATORY_TRACT | Status: DC | PRN

## 2012-02-24 NOTE — Telephone Encounter (Signed)
Called pt and informed inhalers sent to pharmacy. [name], [name]

## 2012-02-24 NOTE — Telephone Encounter (Signed)
Need refills on her Proventil.  Would like to have more than one inhaler dispensed.  Usually get 3 to 4 sent from mail order.  Please call patient back if there is more info needed for request

## 2012-02-24 NOTE — Telephone Encounter (Signed)
Fwd. To PCP for refill. .[name]  

## 2012-03-11 ENCOUNTER — Ambulatory Visit (INDEPENDENT_AMBULATORY_CARE_PROVIDER_SITE_OTHER): Payer: PRIVATE HEALTH INSURANCE | Admitting: *Deleted

## 2012-03-11 DIAGNOSIS — Z23 Encounter for immunization: Secondary | ICD-10-CM

## 2012-03-22 ENCOUNTER — Telehealth: Payer: Self-pay | Admitting: Family Medicine

## 2012-03-22 MED ORDER — PENICILLIN V POTASSIUM 500 MG PO TABS: 500.0000 mg | ORAL_TABLET | Freq: Four times a day (QID) | ORAL | Status: DC

## 2012-03-22 NOTE — Telephone Encounter (Signed)
Patient states she has to schedule appointment with dentist to have a  Wisdom tooth pulled.  Dentist has given her some type of mouthwash to use but it makes her sick feeling when she puts in her mouth. Also states she will need antibiotic after the tooth is pulled and she wants Dr. Clinton Sawyer to order this because he knows which antibiotic she can take that won't affect  Kidneys. Advised will forward message to MD.

## 2012-03-22 NOTE — Telephone Encounter (Signed)
The patient called to ask for prescription for an antibiotic to be taken after her wisdom tooth is removed next week. I have not received any information from dentist yet regarding this procedure. I will prescribe an antibiotic for the patient as requested for the procedure.

## 2012-03-22 NOTE — Telephone Encounter (Signed)
Pt has appt with dentist and needs to talk to nurse about the premedication problem

## 2012-04-20 ENCOUNTER — Ambulatory Visit (INDEPENDENT_AMBULATORY_CARE_PROVIDER_SITE_OTHER): Payer: PRIVATE HEALTH INSURANCE | Admitting: Family Medicine

## 2012-04-20 ENCOUNTER — Encounter: Payer: Self-pay | Admitting: Family Medicine

## 2012-04-20 VITALS — BP 102/66 | HR 82 | Ht 67.0 in | Wt 163.0 lb

## 2012-04-20 DIAGNOSIS — B351 Tinea unguium: Secondary | ICD-10-CM

## 2012-04-20 MED ORDER — CICLOPIROX 8 % EX SOLN: Freq: Every day | CUTANEOUS | Status: DC

## 2012-04-20 NOTE — Progress Notes (Signed)
  Subjective:    Patient ID: [name], female    DOB: [birth year], 78 y.o.   MRN: [id number]  HPI  78 year old F with DM Type 2 who presents for evaluation of her toenails. She believes that she has toenail fungus bilaterally. She has noticed brittle nails and thickening on her lift middle toenail and right second toenail. She notes pain in the joints of her toes on both feet, but not the nails. She denies redness or swelling of the digits.  She called her insurance company to see what treatment it would cover for toenail fungus.    Social - Patient worried that her husband is not checking his blood sugar  Review of Systems Negative for fever, chills, foot ulcer, swelling of joints     Objective:   Physical Exam BP 102/66  Pulse 82  Ht 5\' 7"  (1.702 m)  Wt 163 lb (73.936 kg)  BMI 25.53 kg/m2 Gen: elderly WF, alert and oriented x 3, NAD, very pleasant and conversant Feet: thickening of toenails on his left 3rd digit with mild yellowing an distal thickening of second toenails bilaterally      Assessment & Plan:  78 year old F with likely distal sub-ungal onychomycosis.

## 2012-04-20 NOTE — Patient Instructions (Signed)
Please try this lacquer for your toenail fungus. If it is not effective in one month, then I would stop it.   Please follow up as needed.   Sincerely,   Dr. Clinton Sawyer

## 2012-04-21 DIAGNOSIS — B351 Tinea unguium: Secondary | ICD-10-CM | POA: Insufficient documentation

## 2012-04-21 NOTE — Assessment & Plan Note (Signed)
Clinically, the patient appears to have onychomycosis. We had a discussion about the risk, benefits, and efficacy of different treatment modalities. She would like to try ciclopirox lacquer. She will try it for 4 weeks.

## 2012-05-04 ENCOUNTER — Ambulatory Visit: Payer: PRIVATE HEALTH INSURANCE

## 2012-05-20 ENCOUNTER — Telehealth: Payer: Self-pay | Admitting: Family Medicine

## 2012-05-20 NOTE — Telephone Encounter (Signed)
Patient would like to speak to Dr. Clinton Sawyer about whether a Partial Denture will be helpful or not with her other diagnosis.

## 2012-05-20 NOTE — Telephone Encounter (Signed)
Letter needs to be faxed to [phone] [facility]

## 2012-05-20 NOTE — Telephone Encounter (Signed)
Please put to attention Fleet Contras.

## 2012-05-20 NOTE — Telephone Encounter (Signed)
Called pt. She reports, that she had a wisdom tooth pulled a few days ago. When she saw the dentist, he recommended for her to use her bottom denture. She has lost them a while back. Now, she would like to have new ones, but can not get them UNLESS her PCP will write a letter and fax it to her dentist. The letter needs to state, that pt needs a replacement of her bottom denture due to her medical conditions. It would improve and help her with her conditions. Not having the replacement bottom denture is not good for her well being. I told there, that I would ask Dr.Williamson to write the letter and call her back. Pt will also find out the fax number for the dentist, so we can fax the letter. Thank you. [name], [name]

## 2012-05-21 NOTE — Telephone Encounter (Signed)
Please call the patient and tell her that she needs an office appointment for this issue. She needs a proper evaluation and documentation for me to be able to do this. Thank you.

## 2012-06-15 ENCOUNTER — Telehealth: Payer: Self-pay | Admitting: Family Medicine

## 2012-06-15 NOTE — Telephone Encounter (Addendum)
Patient states she recieved  prednisone  for 12 day course and she  wants to know when  she needs to be concerned about BS reading. Consulted with Dr. Jennette Kettle and she advises as long as patient not having symptoms, blurred vision, frequency of urination BS may run higher , not to worry unless symptoms for the short time she will be on medication.

## 2012-06-15 NOTE — Telephone Encounter (Signed)
Patient is calling because she was prescribed a medicine by her Arthritis doctor and it runs her sugar up and she would like to speak to the nurse about what she needs to do.

## 2012-06-16 ENCOUNTER — Ambulatory Visit: Payer: PRIVATE HEALTH INSURANCE | Admitting: Family Medicine

## 2012-06-25 ENCOUNTER — Other Ambulatory Visit: Payer: Self-pay | Admitting: Family Medicine

## 2012-06-25 MED ORDER — OMEPRAZOLE 20 MG PO CPDR: 20.0000 mg | DELAYED_RELEASE_CAPSULE | Freq: Every day | ORAL | Status: DC | PRN

## 2012-06-25 NOTE — Telephone Encounter (Signed)
Forward to PCP for refill

## 2012-06-25 NOTE — Telephone Encounter (Signed)
Patient is calling because she is out of Omeprazole and would really appreciate a refill being sent to [facility] today.

## 2012-06-28 ENCOUNTER — Ambulatory Visit (INDEPENDENT_AMBULATORY_CARE_PROVIDER_SITE_OTHER): Payer: PRIVATE HEALTH INSURANCE | Admitting: Family Medicine

## 2012-06-28 ENCOUNTER — Encounter: Payer: Self-pay | Admitting: Family Medicine

## 2012-06-28 VITALS — BP 148/78 | HR 80 | Temp 98.0°F | Ht 67.0 in | Wt 162.0 lb

## 2012-06-28 DIAGNOSIS — L2089 Other atopic dermatitis: Secondary | ICD-10-CM

## 2012-06-28 DIAGNOSIS — L209 Atopic dermatitis, unspecified: Secondary | ICD-10-CM

## 2012-06-28 DIAGNOSIS — J069 Acute upper respiratory infection, unspecified: Secondary | ICD-10-CM

## 2012-06-28 DIAGNOSIS — R21 Rash and other nonspecific skin eruption: Secondary | ICD-10-CM

## 2012-06-28 MED ORDER — TRIAMCINOLONE ACETONIDE 0.025 % EX OINT: TOPICAL_OINTMENT | Freq: Two times a day (BID) | CUTANEOUS | Status: DC

## 2012-06-28 NOTE — Progress Notes (Signed)
  Subjective:    Patient ID: [name], female    DOB: [birth year], 78 y.o.   MRN: [id number]  HPI  78 year old F with Type 2 DM who presents with a skin rash and persistent cough.   Rash: located left forearm, "pink spot" , first noticed several years ago, has not changed size, is not painful, no bleeding, no other locations, has not tried any previous treatment  Cough: non productive, associated with rhinorrhea and sore throat, no fever, no swelling of glands, husband sick with similar symptoms    Review of Systems     Objective:   Physical Exam BP 148/78  Pulse 80  Temp 98 F (36.7 C) (Oral)  Ht 5\' 7"  (1.702 m)  Wt 162 lb (73.483 kg)  BMI 25.37 kg/m2  HEENT: NCAT, PERRLA, TM relfective without effusion, exeternal ear canal normal bilat; Boggy nasal turbinates; tonsils absent, no OP erythema or exudate  Lungs: CTA Skin: 0.5 x 0.5 cm scaly rash     Assessment & Plan:

## 2012-06-29 DIAGNOSIS — L570 Actinic keratosis: Secondary | ICD-10-CM | POA: Insufficient documentation

## 2012-06-29 DIAGNOSIS — J069 Acute upper respiratory infection, unspecified: Secondary | ICD-10-CM | POA: Insufficient documentation

## 2012-06-29 NOTE — Assessment & Plan Note (Signed)
Appears like a patch of inflamed skin c/w atopic dermatitis. Currently stable. Tx with steroid cream for 1 month. If not improved, consider biopsy.

## 2012-06-29 NOTE — Assessment & Plan Note (Signed)
Likely to be viral and self limited. Expectant mgt. F/u if persistent fever or SOB.

## 2012-07-01 ENCOUNTER — Telehealth: Payer: Self-pay | Admitting: Family Medicine

## 2012-07-01 NOTE — Telephone Encounter (Signed)
Spoke with [facility] and they have not received the refill from Dr. Clinton Sawyer that was e-Rx'd on 06/25/12.  Called Rx in per refill in Epic.  Meds will be mailed out possibly today and patient will receive in 5-7 business days.  Patient informed and will wait to receive mail order meds instead of having Rx called into local pharmacy.  [name], RN

## 2012-07-01 NOTE — Telephone Encounter (Signed)
Patient is calling because [facility] is saying they never got the refill on her Prilosec and she is completely out.  She has been calling about this since last Friday and she would like this taken care of today.

## 2012-07-07 ENCOUNTER — Other Ambulatory Visit: Payer: Self-pay | Admitting: *Deleted

## 2012-07-07 MED ORDER — OMEPRAZOLE 20 MG PO CPDR: 20.0000 mg | DELAYED_RELEASE_CAPSULE | Freq: Every day | ORAL | Status: DC | PRN

## 2012-08-05 ENCOUNTER — Encounter: Payer: Self-pay | Admitting: Family Medicine

## 2012-08-05 ENCOUNTER — Ambulatory Visit (INDEPENDENT_AMBULATORY_CARE_PROVIDER_SITE_OTHER): Payer: PRIVATE HEALTH INSURANCE | Admitting: Family Medicine

## 2012-08-05 VITALS — BP 123/71 | HR 53 | Ht 67.0 in | Wt 167.0 lb

## 2012-08-05 DIAGNOSIS — E119 Type 2 diabetes mellitus without complications: Secondary | ICD-10-CM

## 2012-08-05 DIAGNOSIS — L989 Disorder of the skin and subcutaneous tissue, unspecified: Secondary | ICD-10-CM

## 2012-08-05 LAB — POCT GLYCOSYLATED HEMOGLOBIN (HGB A1C): Hemoglobin A1C: 6.2

## 2012-08-05 NOTE — Patient Instructions (Signed)
Dear Mrs. [name],   Thank you for coming in today. Here are the things that we talked about.   1. Diabetes - Your A1C is 6.2 and your husbands is 6.8 Keep up the same medicine regimen.   2. Skin lesions - These have not changed over many years, so there is nothing that we need to do about them.   3. Fish oil and laxatives are both OK for [name].   Please come see me in April. Have [name] come see me in the middle of March.   Take Care,   Dr. Clinton Sawyer

## 2012-08-08 NOTE — Progress Notes (Signed)
  Subjective:    Patient ID: [name], female    DOB: August 29, 1927, 79 y.o.   MRN: [id number]  HPI  79 year old F who presents for follow up of a lesion on her arm and evaluation of type 2 DM.    1. Skin Lesion - located left forearm, pink spot, notice several years ago; First evaluated on 1Dec. Concerning for atopic dermatitis so topical steroid prescribed, which did not change the lesion. Since that time, it has not changed in size, color, and does not hurt or bleed. However, the patient does scratch it occasionally.   2. Diabetes  Taking and tolerating: yes - Glipizide 5 mg daily since last visit, b/c she was taken off all meds in May 2013 and A1C rose to 6.9, so restarted in August 2013 Fasting blood sugars: 100's  Hypoglycemic symptoms: no Diet Changes: no Exercise: no Visual problems: no Last eye exam: August 2013 Monitoring feet: yes Numbness/Tingling: no Diabetic Labs:  Lab Results  Component Value Date   HGBA1C 6.2 08/05/2012   HGBA1C 6.9 02/18/2012   HGBA1C 6.4 11/11/2011   Lab Results  Component Value Date   LDLCALC 94 12/19/2011   CREATININE 1.47* 12/19/2011   Last microalbumin: No results found for this basename: MICROALBUR, [id number]   Taking an ACE-I ?: No, stage III CKD   Review of Systems See HPI    Objective:   Physical Exam  BP 123/71  Pulse 53  Ht 5\' 7"  (1.702 m)  Wt 167 lb (75.751 kg)  BMI 26.15 kg/m2 Gen: elderly WF, well appearing, NAD Skin: 0.5 x 0.5 salmon colored discrete macula on left dorsal wrist, mild excoriation over sight, stable since last eval Dec 2013     Assessment & Plan:

## 2012-08-08 NOTE — Assessment & Plan Note (Signed)
Clearly this is not atopic dermatitis. Given presence for longer than patient can remember without change in characteristics, this a benign lesion. Option to have biopsy performed was discussed with patient, and she declined. Continue to monitor.

## 2012-08-08 NOTE — Assessment & Plan Note (Addendum)
Very stable DM Type 2. A1C well controlled on glipizde 5 mg daily. Continue current regimen. Repeat foot exam, eye exam, and renal studies in August 2014.

## 2012-08-11 ENCOUNTER — Ambulatory Visit: Payer: PRIVATE HEALTH INSURANCE | Admitting: Family Medicine

## 2012-08-16 ENCOUNTER — Ambulatory Visit (INDEPENDENT_AMBULATORY_CARE_PROVIDER_SITE_OTHER): Payer: PRIVATE HEALTH INSURANCE | Admitting: Family Medicine

## 2012-08-16 ENCOUNTER — Encounter: Payer: Self-pay | Admitting: Family Medicine

## 2012-08-16 VITALS — BP 126/65 | HR 76 | Temp 98.0°F | Ht 67.0 in | Wt 164.0 lb

## 2012-08-16 DIAGNOSIS — R222 Localized swelling, mass and lump, trunk: Secondary | ICD-10-CM

## 2012-08-16 NOTE — Patient Instructions (Signed)
Those areas of swelling are due to your ribs  If you develop breathing problems or you are worried about a hernia, please come back for a re-evaluation  Follow-up in April with Dr. Clinton Sawyer to talk about your diabetes

## 2012-08-18 DIAGNOSIS — R222 Localized swelling, mass and lump, trunk: Secondary | ICD-10-CM | POA: Insufficient documentation

## 2012-08-18 NOTE — Progress Notes (Signed)
  Subjective:    Patient ID: [name], female    DOB: Jan 09, 1928, 79 y.o.   MRN: [id number]  HPI # SDA. She is concerned about two areas under her breasts bilaterally and wanted them evaluated. She has had these "masses" for years. They are not getting pain, not painful.   Review of Systems Per HPI Denies fevers, chills Denies nausea/vomiting/constipation/abdominal discomfort Denies rash  Allergies, medication, past medical history reviewed.  Smoking status noted.  T2 DM, HLD, HTN S3CKD, secondary hyperparathyroidism, anemia COPD with asthma Atrial fibrillation on warfarin     Objective:   Physical Exam GEN: NAD; well-nourished and well-appearing for her age PSYCH: pleasant, appropriate to questions, alert and oriented NEURO: no focal deficits CV: irregularly irregular PULM: NI WOB CHEST: the areas she points to are at the anterior inferior edges of her rib cage; non-tender SKIN: no rash  ABD: soft, NT, ND; no hernia    Assessment & Plan:

## 2012-08-18 NOTE — Assessment & Plan Note (Signed)
She presents today concerned about two "masses" on her chest. These masses appear to be the anterior inferior edges of her rib cage. She was reassured.

## 2012-09-06 ENCOUNTER — Other Ambulatory Visit: Payer: Self-pay | Admitting: *Deleted

## 2012-09-06 DIAGNOSIS — J449 Chronic obstructive pulmonary disease, unspecified: Secondary | ICD-10-CM

## 2012-09-06 MED ORDER — GLIPIZIDE 5 MG PO TABS: 5.0000 mg | ORAL_TABLET | Freq: Every day | ORAL | Status: DC

## 2012-09-06 MED ORDER — AMLODIPINE BESYLATE 10 MG PO TABS: 10.0000 mg | ORAL_TABLET | Freq: Every day | ORAL | Status: DC

## 2012-09-06 MED ORDER — ATORVASTATIN CALCIUM 80 MG PO TABS: 80.0000 mg | ORAL_TABLET | Freq: Every day | ORAL | Status: DC

## 2012-09-06 MED ORDER — ALBUTEROL SULFATE HFA 108 (90 BASE) MCG/ACT IN AERS: 2.0000 | INHALATION_SPRAY | Freq: Four times a day (QID) | RESPIRATORY_TRACT | Status: DC | PRN

## 2012-09-06 MED ORDER — CARVEDILOL 6.25 MG PO TABS: 6.2500 mg | ORAL_TABLET | Freq: Two times a day (BID) | ORAL | Status: DC

## 2012-09-15 ENCOUNTER — Ambulatory Visit: Payer: Self-pay | Admitting: Internal Medicine

## 2012-09-15 DIAGNOSIS — Z7901 Long term (current) use of anticoagulants: Secondary | ICD-10-CM

## 2012-09-15 DIAGNOSIS — I4891 Unspecified atrial fibrillation: Secondary | ICD-10-CM

## 2012-10-06 ENCOUNTER — Other Ambulatory Visit: Payer: Self-pay | Admitting: Family Medicine

## 2012-10-06 DIAGNOSIS — G609 Hereditary and idiopathic neuropathy, unspecified: Secondary | ICD-10-CM

## 2012-10-06 MED ORDER — GABAPENTIN 100 MG PO CAPS: 100.0000 mg | ORAL_CAPSULE | Freq: Every day | ORAL | Status: DC

## 2012-10-08 ENCOUNTER — Ambulatory Visit (INDEPENDENT_AMBULATORY_CARE_PROVIDER_SITE_OTHER): Payer: PRIVATE HEALTH INSURANCE | Admitting: Family Medicine

## 2012-10-08 ENCOUNTER — Encounter: Payer: Self-pay | Admitting: Family Medicine

## 2012-10-08 VITALS — Ht 67.0 in | Wt 161.3 lb

## 2012-10-08 DIAGNOSIS — E119 Type 2 diabetes mellitus without complications: Secondary | ICD-10-CM

## 2012-10-08 MED ORDER — GLIPIZIDE 2.5 MG HALF TABLET: 2.5000 mg | ORAL_TABLET | Freq: Every day | ORAL | Status: DC

## 2012-10-08 NOTE — Patient Instructions (Addendum)
Please take glipizide if blood sugar greater than 150.   Also, bring your husband back in September to have his eyes checked!   Follow up in 1 month to check on your sugars,   Dr. Clinton Sawyer

## 2012-10-09 NOTE — Progress Notes (Signed)
  Subjective:    Patient ID: [name], female    DOB: 1928-03-14, 79 y.o.   MRN: [id number]  HPI  79 year old F with well controlled Type 2 DM (last A1C 6.1 in January 2014) who presents for diabetes management. She is currently prescribed glipizide 5 mg daily. She normally holds this if she is not going to eat three meals during the day. However, she has been holding it recently due to blood sugars in the 70's. She has not taken her medication in the last week and fasting blood sugars have been 115-120. She denies lightheadedness, diaphoresis, and syncope.   Review of Systems Negative unless stated in HPI      Objective:   Physical Exam Ht 5\' 7"  (1.702 m)  Wt 161 lb 4.8 oz (73.165 kg)  BMI 25.26 kg/m2  Gen: well appearing, non distressed CV: regularly irregular  Neuro: no tremulousness      Assessment & Plan:

## 2012-10-09 NOTE — Assessment & Plan Note (Signed)
Will hold glipizide unless fasting CBG > 150. Also, given Rx for 2.5 mg pills to avoid hypoglycemic events. Follow up 1 month.

## 2012-11-11 ENCOUNTER — Encounter: Payer: Self-pay | Admitting: Family Medicine

## 2012-11-11 ENCOUNTER — Ambulatory Visit: Payer: PRIVATE HEALTH INSURANCE | Admitting: Family Medicine

## 2012-11-11 ENCOUNTER — Ambulatory Visit (INDEPENDENT_AMBULATORY_CARE_PROVIDER_SITE_OTHER): Payer: PRIVATE HEALTH INSURANCE | Admitting: Family Medicine

## 2012-11-11 VITALS — BP 118/71 | HR 80 | Ht 67.0 in | Wt 159.0 lb

## 2012-11-11 DIAGNOSIS — E119 Type 2 diabetes mellitus without complications: Secondary | ICD-10-CM

## 2012-11-11 DIAGNOSIS — L570 Actinic keratosis: Secondary | ICD-10-CM

## 2012-11-11 LAB — POCT GLYCOSYLATED HEMOGLOBIN (HGB A1C): Hemoglobin A1C: 6.5

## 2012-11-11 NOTE — Patient Instructions (Addendum)
[name],   1. Diabetes - Your blood sugar is well controlled, so continue to use the medication only if the blood sugar is above 150.   2. Actinic Keratosis - These are pre-cancerous. We froze them off and will recheck them in 6 weeks. Change the band-aid on Saturday.   3. Lab tests - We need to draw your yearly labs and do a physical in June. Please come back after December 18, 2012 for a lab check. Then schedule an appointment with me within the next week.   Please call if you have any concerns.   Dr. Clinton Sawyer

## 2012-11-11 NOTE — Progress Notes (Signed)
  Subjective:    Patient ID: [name], female    DOB: 1928-03-09, 79 y.o.   MRN: [id number]  HPI  79 year old F with well controlled type 2 DM.   DM Follow up - Has not needed to take any glipizide since all fasting CBG < 150. Not taking any other meds. Continues to monitor diet.   Lesions on lower arm - red, scaly, unchanged over several years, patient worried they might be something like cancer, no hx of skin cancer per patient  Review of Systems     Objective:   Physical Exam  BP 118/71  Pulse 80  Ht 5\' 7"  (1.702 m)  Wt 159 lb (72.122 kg)  BMI 24.9 kg/m2 Gen: elderly female, well appearing, normal body habitus  Skin - red, scaly, mildly rough patches on forearms bilaterally - both approximately 1cm x 1cm       Assessment & Plan:

## 2012-11-12 ENCOUNTER — Telehealth: Payer: Self-pay | Admitting: Internal Medicine

## 2012-11-12 NOTE — Telephone Encounter (Signed)
[name] is asking for clarification on the amount of medication she is suppose to be taking of Furosemide-20mg ..   Thanks

## 2012-11-12 NOTE — Telephone Encounter (Signed)
Once a day is fine.  -[place]

## 2012-11-12 NOTE — Telephone Encounter (Signed)
Returned call.  Pt with concerns about her furosemide dose.  Stated her PCP printed off her med list and it stated she takes it twice a day, but she has never taken it twice a day.  Pt stated she has only taken one pill a day.  Pt informed Dr. Rennis Golden will be notified for further instructions and advised she continue taking furosemide once daily until instructed otherwise.  Pt verbalized understanding and agreed w/ plan.

## 2012-11-15 NOTE — Telephone Encounter (Signed)
Call to pt and pt informed per Dr. Rennis Golden.  Pt verbalized understanding and agreed w/ plan.

## 2012-11-16 ENCOUNTER — Telehealth: Payer: Self-pay | Admitting: Family Medicine

## 2012-11-16 NOTE — Telephone Encounter (Signed)
Patient was seen on 5/15 and had some places frozen.  She was told to change the bandages that Saturday and she noticed that places are bright pink and she would like to speak to the nurse about whether or not this is normal.

## 2012-11-16 NOTE — Telephone Encounter (Signed)
Returned call to pt and informed pt that this was normal and to keep area clean and dry/covered with bandage. Pt verbalized understanding. Wyatt Haste, RN-BSN

## 2012-11-17 ENCOUNTER — Ambulatory Visit: Payer: PRIVATE HEALTH INSURANCE | Admitting: Pharmacist Clinician (PhC)/ Clinical Pharmacy Specialist

## 2012-11-17 NOTE — Telephone Encounter (Signed)
There is nothing special that needs to be done. She can wash it as she regularly would. If there is tenderness, then she can use a band-aid to protect the surface, but otherwise, there is no bandage necessary. She should return sooner if there is severe pain, redness and swelling, or persistent drainage. Thank you.

## 2012-11-17 NOTE — Telephone Encounter (Signed)
Patient changed bandage on Saturday and has changed it twice since then.  Site "looks the same way it did when the doctor put the medicine on it."  Denies any oozing, drainage, or pain.  Has been keeping site dry.  Wants to know what else she is supposed to do to it and how long to keep it bandaged.  Will route to Dr. Clinton Sawyer for advice and call patient back.  [name], RN

## 2012-11-17 NOTE — Telephone Encounter (Signed)
Patient wants to know when does she need to stop wearing the bandages all together.

## 2012-11-17 NOTE — Telephone Encounter (Signed)
She just needs direction for how to treat until the area is completely healed.

## 2012-11-18 NOTE — Telephone Encounter (Signed)
Patient informed of message from Dr. Clinton Sawyer.  [name], RN

## 2012-11-20 NOTE — Assessment & Plan Note (Signed)
Patient without any CBG > 150 and A1C at goal with diet, so no changes. Continue glipizide 2.5 mg daily PRN for fasting CBG > 150.   Lab Results  Component Value Date   HGBA1C 6.5 11/11/2012

## 2012-11-20 NOTE — Assessment & Plan Note (Signed)
On left and right forearm. Both locations frozen with liquid nitrogen. Patient tolerated well and dressing applied. Patient to follow up in 4 weeks.

## 2012-11-23 ENCOUNTER — Ambulatory Visit (INDEPENDENT_AMBULATORY_CARE_PROVIDER_SITE_OTHER): Payer: PRIVATE HEALTH INSURANCE | Admitting: Pharmacist Clinician (PhC)/ Clinical Pharmacy Specialist

## 2012-11-23 VITALS — BP 138/60 | HR 72

## 2012-11-23 DIAGNOSIS — I4891 Unspecified atrial fibrillation: Secondary | ICD-10-CM

## 2012-11-23 DIAGNOSIS — Z7901 Long term (current) use of anticoagulants: Secondary | ICD-10-CM

## 2012-11-23 LAB — POCT INR: INR: 2.9

## 2012-12-06 ENCOUNTER — Telehealth: Payer: Self-pay | Admitting: Family Medicine

## 2012-12-06 NOTE — Telephone Encounter (Signed)
Patient is feeling like she is feeling off balance and she is sure there is something going on in her ears and wants to know if there is something she can do to treat at home.

## 2012-12-06 NOTE — Telephone Encounter (Signed)
Pt reports balance is off and she is having to walk with cane - this has been going on for over a year. " Can I do anything about it or do I just got to leave with it?" recommended continued use of cane for safety and take time getting from one position to another -pt verbalized understanding. NO further concerns - encouraged to call if she is having frequent falls ( pt denied) or dizziness becomes worse. Denies that it is interfering with ADLs. Wyatt Haste, RN-BSN

## 2012-12-20 ENCOUNTER — Other Ambulatory Visit: Payer: Self-pay | Admitting: Family Medicine

## 2012-12-20 ENCOUNTER — Other Ambulatory Visit: Payer: PRIVATE HEALTH INSURANCE

## 2012-12-20 DIAGNOSIS — E119 Type 2 diabetes mellitus without complications: Secondary | ICD-10-CM

## 2012-12-20 LAB — CBC
Hemoglobin: 12.3 g/dL (ref 12.0–15.0)
MCHC: 34.7 g/dL (ref 30.0–36.0)
Platelets: 196 10*3/uL (ref 150–400)
RDW: 14.3 % (ref 11.5–15.5)

## 2012-12-20 LAB — BASIC METABOLIC PANEL
Potassium: 4.1 mEq/L (ref 3.5–5.3)
Sodium: 140 mEq/L (ref 135–145)

## 2012-12-20 NOTE — Progress Notes (Signed)
BMP AND CBC DONE TODAY [name] 

## 2012-12-22 ENCOUNTER — Telehealth: Payer: Self-pay | Admitting: Family Medicine

## 2012-12-22 ENCOUNTER — Encounter: Payer: Self-pay | Admitting: Family Medicine

## 2012-12-22 ENCOUNTER — Ambulatory Visit (INDEPENDENT_AMBULATORY_CARE_PROVIDER_SITE_OTHER): Payer: PRIVATE HEALTH INSURANCE | Admitting: Pharmacist Clinician (PhC)/ Clinical Pharmacy Specialist

## 2012-12-22 VITALS — BP 120/56 | HR 64

## 2012-12-22 DIAGNOSIS — I4891 Unspecified atrial fibrillation: Secondary | ICD-10-CM

## 2012-12-22 DIAGNOSIS — Z7901 Long term (current) use of anticoagulants: Secondary | ICD-10-CM

## 2012-12-22 LAB — POCT INR: INR: 2.1

## 2012-12-22 NOTE — Telephone Encounter (Signed)
Will FWD to MD.  Shadrick Senne L, CMA  

## 2012-12-22 NOTE — Telephone Encounter (Signed)
Pt notified.  Kathia Covington L, CMA  

## 2012-12-22 NOTE — Telephone Encounter (Signed)
Patient was calling for her lab results, kidney and liver function. She stated that if she doesn't answer you could leave a message on her answering machine. JW

## 2012-12-22 NOTE — Telephone Encounter (Signed)
A letter was sent today to patient with the results. All were stable. In the future, she can expect a letter with the results for screening labs and does not have to call. Thank you.

## 2012-12-27 ENCOUNTER — Ambulatory Visit (INDEPENDENT_AMBULATORY_CARE_PROVIDER_SITE_OTHER): Payer: PRIVATE HEALTH INSURANCE | Admitting: Family Medicine

## 2012-12-27 ENCOUNTER — Encounter: Payer: Self-pay | Admitting: Family Medicine

## 2012-12-27 VITALS — BP 114/70 | HR 73 | Temp 99.4°F | Ht 67.0 in | Wt 158.0 lb

## 2012-12-27 DIAGNOSIS — L570 Actinic keratosis: Secondary | ICD-10-CM

## 2012-12-27 DIAGNOSIS — Z0000 Encounter for general adult medical examination without abnormal findings: Secondary | ICD-10-CM

## 2012-12-27 NOTE — Patient Instructions (Signed)
[name],   I think that you are doing great. We do not need to change anything as far as your management. Please follow up in 3-4 weeks to have the lesions on your arm frozen again. Also, you do not need to take Vitamin B50.   Sincerely,   Dr. Clinton Sawyer

## 2012-12-27 NOTE — Progress Notes (Signed)
Subjective:    Patient ID: [name], female    DOB: Nov 29, 1927, 79 y.o.   MRN: [id number]  HPI  79 year old F with DM type 2, CKD III and atrial fibrillation on chronic anticoagulation. She has no new complaints. She presents for an annual preventive care visit and would like to follow up on her lab results.   Health Screening History - Patient does not need a colonoscopy, had last mammography in 2012 that was negative - Patient notes previous bone density screening negative for osteoporosis, never had a fall or low impact fracture    Following sections updated below:  Past Medical History  Diagnosis Date  . Anemia     anemia of renal disease  . Diabetes mellitus   . Hypertension   . Chronic kidney disease     Stage III- followed by [facility]  . CHF (congestive heart failure)     Diastolic dys-- EF 60% july 2011  . Hyperlipidemia   . Arthritis     mutiple joints  . Cancer     Renal neoplasm- survellience via Urology  . Peripheral neuropathy   . COPD (chronic obstructive pulmonary disease)   . Pneumonia   . DIASTOLIC DYSFUNCTION 01/22/2010    Qualifier: Diagnosis of  By: Jeanice Lim MD, Kingsley Spittle    . NEOPLASM, KIDNEY 03/04/2007    Per patient- Urologist no longer following- no changes.      . Venous insufficiency 02/24/2011   Past Surgical History  Procedure Laterality Date  . Cholecystectomy      Current Outpatient Prescriptions on File Prior to Visit  Medication Sig Dispense Refill  . acetaminophen-codeine (TYLENOL #3) 300-30 MG per tablet       . albuterol (PROVENTIL HFA;VENTOLIN HFA) 108 (90 BASE) MCG/ACT inhaler Inhale 2 puffs into the lungs every 6 (six) hours as needed for wheezing or shortness of breath.  3 Inhaler  1  . albuterol (PROVENTIL) (2.5 MG/3ML) 0.083% nebulizer solution Take 3 mLs (2.5 mg total) by nebulization every 6 (six) hours as needed for wheezing.  150 mL  1  . amLODipine (NORVASC) 10 MG tablet Take 1 tablet (10 mg total) by mouth daily.  90  tablet  3  . aspirin 81 MG chewable tablet Chew 81 mg by mouth daily.       Marland Kitchen atorvastatin (LIPITOR) 80 MG tablet Take 1 tablet (80 mg total) by mouth at bedtime.  90 tablet  3  . Calcium Carbonate-Vitamin D (CALCIUM 600+D) 600-200 MG-UNIT TABS Take 1 tablet by mouth daily.       . carvedilol (COREG) 6.25 MG tablet Take 1 tablet (6.25 mg total) by mouth 2 (two) times daily with a meal.  180 tablet  3  . chlorhexidine (PERIDEX) 0.12 % solution       . ciclopirox (PENLAC) 8 % solution Apply topically at bedtime. Apply over nail and surrounding skin. Apply daily over previous coat. After seven (7) days, may remove with alcohol and continue cycle.  6.6 mL  0  . CINNAMON PO Take 1 tablet by mouth daily.       . furosemide (LASIX) 20 MG tablet Take 20 mg by mouth 2 (two) times daily.      Marland Kitchen gabapentin (NEURONTIN) 100 MG capsule Take 1 capsule (100 mg total) by mouth at bedtime.  90 capsule  3  . glipiZIDE (GLUCOTROL) 2.5 mg TABS Take 0.5 tablets (2.5 mg total) by mouth daily before breakfast.  90 tablet  3  .  losartan (COZAAR) 50 MG tablet Take 50 mg by mouth daily.      . Omega-3 Fatty Acids (FISH OIL) 1000 MG CAPS Take 1 capsule by mouth 2 (two) times daily.       Marland Kitchen omeprazole (PRILOSEC) 20 MG capsule Take 1 capsule (20 mg total) by mouth daily as needed. For acid reflux  90 capsule  1  . oxyCODONE-acetaminophen (PERCOCET/ROXICET) 5-325 MG per tablet       . penicillin v potassium (VEETID) 500 MG tablet Take 1 tablet (500 mg total) by mouth 4 (four) times daily.  28 tablet  0  . triamcinolone (KENALOG) 0.025 % ointment Apply topically 2 (two) times daily.  30 g  1  . warfarin (COUMADIN) 5 MG tablet Take 5-7.5 mg by mouth daily. Take 1.5 tablets (7.5mg ) on Mondays and Fridays Take 1 tablet (5 mg) all other days of the week.       Current Facility-Administered Medications on File Prior to Visit  Medication Dose Route Frequency Provider Last Rate Last Dose  . 0.9 %  sodium chloride infusion    Intravenous Continuous Chrystie Nose, MD      . sodium chloride 0.9 % injection 3 mL  3 mL Intravenous Q12H Chrystie Nose, MD           Review of Systems  Constitutional: Negative.   HENT: Negative.   Eyes: Positive for visual disturbance.  Respiratory: Negative.   Cardiovascular: Negative.   Gastrointestinal: Negative.   Endocrine: Negative.   Genitourinary: Negative.   Musculoskeletal: Positive for arthralgias.  Skin: Positive for color change.  Neurological: Negative.   Psychiatric/Behavioral: Negative.        Objective:   Physical Exam  BP 114/70  Pulse 73  Temp(Src) 99.4 F (37.4 C) (Oral)  Ht 5\' 7"  (1.702 m)  Wt 158 lb (71.668 kg)  BMI 24.74 kg/m2 Gen: elderly female, well appearing, NAD, pleasant and conversant HEENT: NCAT, PERRLA, EOMI, OP clear and moist, no lymphadenopathy, no thyroid tenderness, enlargement, or nodules CV: irregularly irregular, no m/r/g, no JVD or carotid bruits Pulm: normal WOB, CTA-B Abd: soft, NDNT, NABS Extremities: no edema or joint tenderness Skin: warm, dry, pink macules on distal upper extremity unchanged from previous  Neuro/Psych: A&Ox4, normal affect, speech, and thought content       Assessment & Plan:

## 2012-12-28 ENCOUNTER — Telehealth: Payer: Self-pay | Admitting: Family Medicine

## 2012-12-28 DIAGNOSIS — Z0000 Encounter for general adult medical examination without abnormal findings: Secondary | ICD-10-CM | POA: Insufficient documentation

## 2012-12-28 NOTE — Assessment & Plan Note (Signed)
The patient has numerous chronic illness that are well controlled. The patient is well connected to the medical system and compliant with medical therapy. We discussed risks and benefits of further cancer screening, and we decided that further screening would not likely be beneficial. Additionally, the patient does not want another bone density scan at this time, and no work-up for osteoporosis is indicated. Continue current medical therapy.

## 2012-12-28 NOTE — Telephone Encounter (Signed)
Called pt. lmvm to call back. Please ask pt, if she has seen a dermatologist before and the reason why she wants to see dermatologist. Thanks. [name], [name]

## 2012-12-28 NOTE — Telephone Encounter (Signed)
Patient forgot to ask for a referral for a Dermatologist when she was here yesterday.

## 2012-12-28 NOTE — Telephone Encounter (Signed)
Patient does not need referral to dermatology at this point. I will readdress them in the near future.

## 2012-12-28 NOTE — Telephone Encounter (Signed)
Patient is calling back to let the nurse know that she has not seen a Dermatologist before so whoever Dr. Clinton Sawyer would want her to go to is fine.  This is for the places on her arm.  She has one place on one arm and 3 places on the other arm.  She says Dr. Clinton Sawyer has seen them.

## 2012-12-28 NOTE — Assessment & Plan Note (Signed)
Appears unchanged s/p cryotherapy. Will return to clinic for repeat treatment.

## 2013-01-07 ENCOUNTER — Telehealth: Payer: Self-pay | Admitting: Family Medicine

## 2013-01-07 NOTE — Telephone Encounter (Signed)
Pt is requesting that Dr. Clinton Sawyer change her medication. She is taking Lipitor and has been taking this for a very long period. She is affraid to continue with this medication because of the side effects like liver failure. She seen commercial for a different type of medication called Simvastatin. She would really like to take this and also wants to have a liver tested to make she is okay. JW

## 2013-01-10 NOTE — Telephone Encounter (Signed)
Please call patient and ask her to come in for a visit to discuss this.

## 2013-01-10 NOTE — Telephone Encounter (Signed)
Called pt. Advised to schedule OV. Pt agreed. [name], [name]

## 2013-01-18 ENCOUNTER — Encounter: Payer: Self-pay | Admitting: Family Medicine

## 2013-01-18 ENCOUNTER — Ambulatory Visit (INDEPENDENT_AMBULATORY_CARE_PROVIDER_SITE_OTHER): Payer: PRIVATE HEALTH INSURANCE | Admitting: Family Medicine

## 2013-01-18 VITALS — BP 115/73 | HR 78 | Ht 67.0 in | Wt 158.0 lb

## 2013-01-18 DIAGNOSIS — E785 Hyperlipidemia, unspecified: Secondary | ICD-10-CM

## 2013-01-18 NOTE — Patient Instructions (Addendum)
Please come back tomorrow for a check of your cholesterol and liver function.   After that I will call you, and we can discuss what to do about the medication. You can stop Lipitor for right now.   Sincerely,   Dr. Clinton Sawyer

## 2013-01-18 NOTE — Progress Notes (Signed)
  Subjective:    Patient ID: [name], female    DOB: February 03, 1928, 79 y.o.   MRN: [id number]  HPI  79 year old F who presents to discuss use of lipitor for lipid lowering agent. She has been on lipitor 80 mg for at least 5 years. She is concerned that it might be damaging her liver based on advertisements on television. She does not have a history of liver damage or dysfunction. Her last lipid panel and liver function tests were measured in June 2013. Both were normal at that time. She currently denies RUQ pain, jaundice, or change in stool.   Patient Active Problem List   Diagnosis Date Noted  . Campath-induced atrial fibrillation 02/06/2011    Priority: High  . Onychomycosis of toenail 04/21/2012    Priority: Medium  . Secondary hyperparathyroidism 11/10/2011    Priority: Medium  . Warfarin anticoagulation 06/09/2011    Priority: Medium  . Chronic obstructive airway disease with asthma 01/04/2010    Priority: Medium  . RENAL DISEASE, CHRONIC, STAGE III 01/26/2007    Priority: Medium  . DIABETES MELLITUS II, UNCOMPLICATED 08/27/2006    Priority: Medium  . HYPERLIPIDEMIA 08/27/2006    Priority: Medium  . HYPERTENSION, BENIGN SYSTEMIC 08/27/2006    Priority: Medium  . OSTEOARTHRITIS, MULTI SITES 08/27/2006    Priority: Medium  . Actinic keratosis 06/29/2012    Priority: Low  . History of anemia 11/10/2011    Priority: Low  . Hypercalcemia 09/30/2010    Priority: Low  . LOW BACK PAIN, CHRONIC 05/02/2010    Priority: Low  . NEUROPATHY, PERIPHERAL 08/27/2006    Priority: Low  . Encounter for preventive health examination 12/28/2012   Past Medical History  Diagnosis Date  . Anemia     anemia of renal disease  . Diabetes mellitus   . Hypertension   . Chronic kidney disease     Stage III- followed by [facility]  . CHF (congestive heart failure)     Diastolic dys-- EF 60% july 2011  . Hyperlipidemia   . Arthritis     mutiple joints  . Cancer     Renal neoplasm-  survellience via Urology  . Peripheral neuropathy   . COPD (chronic obstructive pulmonary disease)   . Pneumonia   . DIASTOLIC DYSFUNCTION 01/22/2010    Qualifier: Diagnosis of  By: Jeanice Lim MD, Kingsley Spittle    . NEOPLASM, KIDNEY 03/04/2007    Per patient- Urologist no longer following- no changes.      . Venous insufficiency 02/24/2011     Review of Systems See HPI    Objective:   Physical Exam BP 115/73  Pulse 78  Ht 5\' 7"  (1.702 m)  Wt 158 lb (71.668 kg)  BMI 24.74 kg/m2 Gen: elederly female, non ill appearing, pleasant and conversant Eyes: no scleral icterus Abd: non distended, non tender, no hepatomegaly  Skin: no jaundice       Assessment & Plan:

## 2013-01-18 NOTE — Assessment & Plan Note (Signed)
Patient in area without clearly defined need for treatment, however she does not have an indication for stopping treatment. While she has type 2 diabetes (diet controlled) which would precipitate treatment with a statin in an attempt to lower heart disease risk, she is > age 79, so the ATP IV guidelines do not apply. I counseled the patient on her risk of injury to her liver with lipitor being < 06/4998, but I also acknowledged her concern. At her age and state of health, there are multiple options:  - Start with checking lipid panel and LFT's - If LFT's abnormal, likely stop all statin and wait for resolution - If LFT's normal and lipids WNL, then decrease dose of lipitor vs change to another statin based upon patient preference

## 2013-01-19 ENCOUNTER — Other Ambulatory Visit: Payer: PRIVATE HEALTH INSURANCE

## 2013-01-19 DIAGNOSIS — E785 Hyperlipidemia, unspecified: Secondary | ICD-10-CM

## 2013-01-19 NOTE — Progress Notes (Signed)
CMP AND FLP DONE TODAY [name] 

## 2013-01-20 ENCOUNTER — Other Ambulatory Visit: Payer: PRIVATE HEALTH INSURANCE

## 2013-01-20 ENCOUNTER — Telehealth: Payer: Self-pay | Admitting: Family Medicine

## 2013-01-20 ENCOUNTER — Ambulatory Visit (INDEPENDENT_AMBULATORY_CARE_PROVIDER_SITE_OTHER): Payer: PRIVATE HEALTH INSURANCE | Admitting: Pharmacist Clinician (PhC)/ Clinical Pharmacy Specialist

## 2013-01-20 VITALS — BP 128/54 | HR 68

## 2013-01-20 DIAGNOSIS — I4891 Unspecified atrial fibrillation: Secondary | ICD-10-CM

## 2013-01-20 DIAGNOSIS — Z7901 Long term (current) use of anticoagulants: Secondary | ICD-10-CM

## 2013-01-20 LAB — COMPREHENSIVE METABOLIC PANEL
Albumin: 4.3 g/dL (ref 3.5–5.2)
Alkaline Phosphatase: 54 U/L (ref 39–117)
BUN: 34 mg/dL — ABNORMAL HIGH (ref 6–23)
Glucose, Bld: 108 mg/dL — ABNORMAL HIGH (ref 70–99)
Total Bilirubin: 1.1 mg/dL (ref 0.3–1.2)

## 2013-01-20 LAB — LIPID PANEL
Cholesterol: 146 mg/dL (ref 0–200)
HDL: 52 mg/dL (ref >39)
Total CHOL/HDL Ratio: 2.8 Ratio
Triglycerides: 138 mg/dL (ref <150)

## 2013-01-20 LAB — POCT INR: INR: 3.1

## 2013-01-20 NOTE — Telephone Encounter (Signed)
I called patient to inform her that LFT's normal and cholesterol well controlled. Therefore, she would like to stay on Lipitor 80 mg. I am agreeable to this plan.

## 2013-02-02 ENCOUNTER — Other Ambulatory Visit: Payer: Self-pay | Admitting: Family Medicine

## 2013-02-02 ENCOUNTER — Other Ambulatory Visit: Payer: Self-pay | Admitting: Internal Medicine

## 2013-02-02 ENCOUNTER — Other Ambulatory Visit: Payer: Self-pay | Admitting: Pharmacist

## 2013-02-02 DIAGNOSIS — K219 Gastro-esophageal reflux disease without esophagitis: Secondary | ICD-10-CM

## 2013-02-02 MED ORDER — WARFARIN SODIUM 5 MG PO TABS: ORAL_TABLET | ORAL | Status: DC

## 2013-02-10 ENCOUNTER — Telehealth: Payer: Self-pay | Admitting: Internal Medicine

## 2013-02-10 ENCOUNTER — Telehealth: Payer: Self-pay | Admitting: Cardiology

## 2013-02-10 NOTE — Telephone Encounter (Signed)
Pt called after office hours concerned that she mistakenly took two 5 mg Coumadin tablets today by mistake. She states that she typically gets her INR checked at our office every 4 weeks and has a scheduled appointment next week. I informed the patient to watch for any abnormal signs of bleeding and to seek emergent medical treatment if she falls at home. I instructed her to call the office first thing in the morning to arrange for an INR check tomorrow with Phylis Bougie, RPH. She verbalized understanding. I will follow up with [name] myself tomorrow to ensure that the patient has followed up and received proper instruction.   Allayne Butcher, PA-C [facility]

## 2013-02-10 NOTE — Telephone Encounter (Signed)
Pt called stating that she forgot to take her medication last night and wants to know what to do. She asked that you please call her back.

## 2013-02-10 NOTE — Telephone Encounter (Signed)
Advised pt not to double up on warfarin, continue tonight at normal dose.  Pt voiced understanding

## 2013-02-17 ENCOUNTER — Ambulatory Visit (INDEPENDENT_AMBULATORY_CARE_PROVIDER_SITE_OTHER): Payer: PRIVATE HEALTH INSURANCE | Admitting: Pharmacist Clinician (PhC)/ Clinical Pharmacy Specialist

## 2013-02-17 VITALS — BP 120/60 | HR 68

## 2013-02-17 DIAGNOSIS — I4891 Unspecified atrial fibrillation: Secondary | ICD-10-CM

## 2013-02-17 DIAGNOSIS — Z7901 Long term (current) use of anticoagulants: Secondary | ICD-10-CM

## 2013-02-17 LAB — POCT INR: INR: 2.8

## 2013-03-01 ENCOUNTER — Other Ambulatory Visit: Payer: Self-pay | Admitting: Orthopedic Surgery

## 2013-03-01 DIAGNOSIS — M549 Dorsalgia, unspecified: Secondary | ICD-10-CM

## 2013-03-02 ENCOUNTER — Telehealth: Payer: Self-pay | Admitting: Pharmacist Clinician (PhC)/ Clinical Pharmacy Specialist

## 2013-03-02 NOTE — Telephone Encounter (Signed)
Please call-she needs to talk to you about stopping her coumadin.

## 2013-03-02 NOTE — Telephone Encounter (Signed)
Pt needs spinal injection - states MD sent request to Dr. Rennis Golden to hold warfarin x 5 days.  Will cancel appt for tomorrow, pt to reschedule after procedure

## 2013-03-02 NOTE — Telephone Encounter (Signed)
Pt to have lumbar facet injection by Dr. Charlett Blake, hasn't been scheduled yet, but needs to be off warfarin x 5 days.  Ok'd per Dr. Rennis Golden, will fax clearance letter to [facility] today

## 2013-03-03 ENCOUNTER — Telehealth: Payer: Self-pay | Admitting: *Deleted

## 2013-03-03 NOTE — Telephone Encounter (Signed)
I returned patient's call, and she stated that she is taking new medication from Dr. Charlett Blake, Hydrocodone-acetaminophen 5-325 mg q 6 hours PRN, and wants to know if that is going to hurt her liver. I assured her that liver function tests were normal in July and that if she is only taking 4 norco tablets per day, then there is no reason for me to think that she is going to have any liver problems. Continue current medications as prescribed.

## 2013-03-03 NOTE — Telephone Encounter (Signed)
Faxed permission to hold Coumadin for 4 days or until INR below 1.5 for procedure.

## 2013-03-03 NOTE — Telephone Encounter (Signed)
Pt reports that legs and hip are in "excruciating pain even with pain meds and lipitor - I know that both are bad for the liver - can I stop the lipitor and just take the pain meds. I am worried about my liver" Wyatt Haste, RN-BSN

## 2013-03-04 ENCOUNTER — Ambulatory Visit: Payer: PRIVATE HEALTH INSURANCE | Admitting: Family Medicine

## 2013-03-10 ENCOUNTER — Ambulatory Visit: Payer: PRIVATE HEALTH INSURANCE | Admitting: Pharmacist Clinician (PhC)/ Clinical Pharmacy Specialist

## 2013-03-11 ENCOUNTER — Ambulatory Visit (INDEPENDENT_AMBULATORY_CARE_PROVIDER_SITE_OTHER): Payer: PRIVATE HEALTH INSURANCE | Admitting: Pharmacist Clinician (PhC)/ Clinical Pharmacy Specialist

## 2013-03-11 ENCOUNTER — Ambulatory Visit
Admission: RE | Admit: 2013-03-11 | Discharge: 2013-03-11 | Disposition: A | Discharge status: Home / Self Care | Payer: PRIVATE HEALTH INSURANCE | Source: Ambulatory Visit | Attending: Orthopedic Surgery | Admitting: Orthopedic Surgery

## 2013-03-11 ENCOUNTER — Ambulatory Visit: Payer: PRIVATE HEALTH INSURANCE | Admitting: Pharmacist Clinician (PhC)/ Clinical Pharmacy Specialist

## 2013-03-11 VITALS — BP 120/62 | HR 72

## 2013-03-11 VITALS — BP 123/52 | HR 72

## 2013-03-11 DIAGNOSIS — I4891 Unspecified atrial fibrillation: Secondary | ICD-10-CM

## 2013-03-11 DIAGNOSIS — Z7901 Long term (current) use of anticoagulants: Secondary | ICD-10-CM

## 2013-03-11 DIAGNOSIS — M549 Dorsalgia, unspecified: Secondary | ICD-10-CM

## 2013-03-11 LAB — POCT INR: INR: 1.3

## 2013-03-11 MED ORDER — IOHEXOL 180 MG/ML  SOLN
1.0000 mL | Freq: Once | INTRAMUSCULAR | Status: AC | PRN
  Administered 2013-03-11: 1 mL via EPIDURAL

## 2013-03-11 MED ORDER — METHYLPREDNISOLONE ACETATE 40 MG/ML INJ SUSP (RADIOLOG
120.0000 mg | Freq: Once | INTRAMUSCULAR | Status: AC
  Administered 2013-03-11: 120 mg via EPIDURAL

## 2013-03-11 NOTE — Progress Notes (Addendum)
Pt reminded to resume coumadin to day.

## 2013-03-17 ENCOUNTER — Ambulatory Visit: Payer: PRIVATE HEALTH INSURANCE | Admitting: Pharmacist Clinician (PhC)/ Clinical Pharmacy Specialist

## 2013-03-21 ENCOUNTER — Other Ambulatory Visit: Payer: Self-pay | Admitting: Orthopedic Surgery

## 2013-03-21 DIAGNOSIS — M549 Dorsalgia, unspecified: Secondary | ICD-10-CM

## 2013-03-24 ENCOUNTER — Ambulatory Visit: Payer: PRIVATE HEALTH INSURANCE | Admitting: Pharmacist Clinician (PhC)/ Clinical Pharmacy Specialist

## 2013-03-24 ENCOUNTER — Telehealth: Payer: Self-pay | Admitting: Family Medicine

## 2013-03-24 DIAGNOSIS — E119 Type 2 diabetes mellitus without complications: Secondary | ICD-10-CM

## 2013-03-24 NOTE — Telephone Encounter (Signed)
Pt is asking for a refill of test strips, lances, and meter sent in to the pharmacy. JW

## 2013-03-24 NOTE — Telephone Encounter (Signed)
Please ask why the patient needs a new meter and what type of strips and lancets are being used.

## 2013-03-24 NOTE — Telephone Encounter (Signed)
Will fwd. To PCP for refills. [name], [name] Thank you.

## 2013-03-25 MED ORDER — ONETOUCH ULTRA 2 W/DEVICE KIT: PACK | Status: DC

## 2013-03-25 MED ORDER — GLUCOSE BLOOD VI STRP: ORAL_STRIP | Status: DC

## 2013-03-25 MED ORDER — ONETOUCH DELICA LANCETS 33G MISC: 1.0000 | Freq: Two times a day (BID) | Status: DC

## 2013-03-25 NOTE — Telephone Encounter (Signed)
Patient informed that prescription sent and she only needs to check her sugar once a day in the morning. She will also use the meter to check her husband's sugar once daily in the morning as well.

## 2013-03-25 NOTE — Telephone Encounter (Signed)
Called pt. She reports, that her glucometer is broken. She had it for a long time. Needs a new one sent to her pharmacy: She thinks, that it would be the ACCUCHECK GLUCOMETER. Also needs teststrips and lancets with it. Will fwd. To Dr.Williamson. Thank you. [name], [name]

## 2013-03-30 ENCOUNTER — Ambulatory Visit (INDEPENDENT_AMBULATORY_CARE_PROVIDER_SITE_OTHER): Payer: PRIVATE HEALTH INSURANCE | Admitting: Pharmacist Clinician (PhC)/ Clinical Pharmacy Specialist

## 2013-03-30 ENCOUNTER — Ambulatory Visit
Admission: RE | Admit: 2013-03-30 | Discharge: 2013-03-30 | Disposition: A | Discharge status: Home / Self Care | Payer: PRIVATE HEALTH INSURANCE | Source: Ambulatory Visit | Attending: Orthopedic Surgery | Admitting: Orthopedic Surgery

## 2013-03-30 ENCOUNTER — Other Ambulatory Visit: Payer: Self-pay | Admitting: Orthopedic Surgery

## 2013-03-30 ENCOUNTER — Ambulatory Visit: Payer: PRIVATE HEALTH INSURANCE | Admitting: Pharmacist Clinician (PhC)/ Clinical Pharmacy Specialist

## 2013-03-30 VITALS — BP 130/58 | HR 80

## 2013-03-30 DIAGNOSIS — M549 Dorsalgia, unspecified: Secondary | ICD-10-CM

## 2013-03-30 DIAGNOSIS — I4891 Unspecified atrial fibrillation: Secondary | ICD-10-CM

## 2013-03-30 DIAGNOSIS — Z7901 Long term (current) use of anticoagulants: Secondary | ICD-10-CM

## 2013-03-30 MED ORDER — IOHEXOL 180 MG/ML  SOLN
1.0000 mL | Freq: Once | INTRAMUSCULAR | Status: AC | PRN
  Administered 2013-03-30: 1 mL via EPIDURAL

## 2013-03-30 MED ORDER — METHYLPREDNISOLONE ACETATE 40 MG/ML INJ SUSP (RADIOLOG
120.0000 mg | Freq: Once | INTRAMUSCULAR | Status: AC
  Administered 2013-03-30: 120 mg via EPIDURAL

## 2013-03-31 ENCOUNTER — Telehealth: Payer: Self-pay | Admitting: Family Medicine

## 2013-03-31 ENCOUNTER — Telehealth: Payer: Self-pay | Admitting: Pharmacist Clinician (PhC)/ Clinical Pharmacy Specialist

## 2013-03-31 NOTE — Telephone Encounter (Signed)
Forgot to take her coumadin last night.Wants to know what she needs to do?

## 2013-03-31 NOTE — Telephone Encounter (Signed)
Pt called back. She is just going to take her usual dosage

## 2013-03-31 NOTE — Telephone Encounter (Signed)
Glucose was 175 this morning. Patient calls wanting to know if she should take 2 dosages of glipizide or just 1? Please call patient.

## 2013-03-31 NOTE — Telephone Encounter (Signed)
Advised pt to not take extra doses, continue tonight.  Pt voiced understanding

## 2013-04-01 ENCOUNTER — Ambulatory Visit: Payer: PRIVATE HEALTH INSURANCE | Admitting: Pharmacist Clinician (PhC)/ Clinical Pharmacy Specialist

## 2013-04-15 ENCOUNTER — Ambulatory Visit (INDEPENDENT_AMBULATORY_CARE_PROVIDER_SITE_OTHER): Payer: PRIVATE HEALTH INSURANCE | Admitting: Pharmacist Clinician (PhC)/ Clinical Pharmacy Specialist

## 2013-04-15 VITALS — BP 120/60 | HR 76

## 2013-04-15 DIAGNOSIS — Z7901 Long term (current) use of anticoagulants: Secondary | ICD-10-CM

## 2013-04-15 DIAGNOSIS — I4891 Unspecified atrial fibrillation: Secondary | ICD-10-CM

## 2013-04-15 LAB — POCT INR: INR: 3.5

## 2013-04-21 ENCOUNTER — Other Ambulatory Visit: Payer: Self-pay | Admitting: Neurosurgery

## 2013-04-25 ENCOUNTER — Encounter: Payer: Self-pay | Admitting: Internal Medicine

## 2013-04-25 ENCOUNTER — Telehealth: Payer: Self-pay | Admitting: *Deleted

## 2013-04-25 NOTE — Telephone Encounter (Signed)
Faxed cardiac clearance to [facility] in 04/25/13

## 2013-04-26 ENCOUNTER — Telehealth: Payer: Self-pay | Admitting: Pharmacist Clinician (PhC)/ Clinical Pharmacy Specialist

## 2013-04-26 NOTE — Telephone Encounter (Signed)
Please call-forgot to take her coumadin.

## 2013-04-27 ENCOUNTER — Encounter: Payer: Self-pay | Admitting: Family Medicine

## 2013-04-27 ENCOUNTER — Ambulatory Visit (INDEPENDENT_AMBULATORY_CARE_PROVIDER_SITE_OTHER): Payer: PRIVATE HEALTH INSURANCE | Admitting: Family Medicine

## 2013-04-27 VITALS — BP 108/67 | HR 74 | Ht 67.0 in | Wt 141.0 lb

## 2013-04-27 DIAGNOSIS — E119 Type 2 diabetes mellitus without complications: Secondary | ICD-10-CM

## 2013-04-27 DIAGNOSIS — R634 Abnormal weight loss: Secondary | ICD-10-CM

## 2013-04-27 LAB — COMPREHENSIVE METABOLIC PANEL
ALT: 16 U/L (ref 0–35)
AST: 16 U/L (ref 0–37)
CO2: 26 mEq/L (ref 19–32)
Calcium: 9.6 mg/dL (ref 8.4–10.5)
Chloride: 104 mEq/L (ref 96–112)
Creat: 1.67 mg/dL — ABNORMAL HIGH (ref 0.50–1.10)
Sodium: 139 mEq/L (ref 135–145)
Total Protein: 6.3 g/dL (ref 6.0–8.3)

## 2013-04-27 LAB — POCT GLYCOSYLATED HEMOGLOBIN (HGB A1C): Hemoglobin A1C: 7.1

## 2013-04-27 NOTE — Progress Notes (Signed)
Patient ID: [name], female   DOB: May 29, 1928, 79 y.o.   MRN: [id number]   Subjective:   Diabetes  Recent Issues: None    Medication Compliance: Diabetes medication: No patient not taking medication at this time  Taking ACE-I/ARB: Yes Taking statin: Yes  Behavioral: Home CBG Monitoring: Yes, CBG < 150  Diet changes: No Exercise: Yes   Weight Loss - new problem, pt concerned of weight loss secondary to decreased appetite and early satiety, she denies any diarrhea nausea or vomiting, no new medications, patient has access to food at home, denies depression or mood changes, but does admit to worrying a lot about what she eats and how that may impact her blood sugar    Wt Readings from Last 5 Encounters:  04/27/13 141 lb (63.957 kg)  01/18/13 158 lb (71.668 kg)  12/27/12 158 lb (71.668 kg)  11/11/12 159 lb (72.122 kg)  10/08/12 161 lb 4.8 oz (73.165 kg)       Current Outpatient Prescriptions on File Prior to Visit  Medication Sig Dispense Refill  . acetaminophen-codeine (TYLENOL #3) 300-30 MG per tablet       . albuterol (PROVENTIL HFA;VENTOLIN HFA) 108 (90 BASE) MCG/ACT inhaler Inhale 2 puffs into the lungs every 6 (six) hours as needed for wheezing or shortness of breath.  3 Inhaler  1  . albuterol (PROVENTIL) (2.5 MG/3ML) 0.083% nebulizer solution Take 3 mLs (2.5 mg total) by nebulization every 6 (six) hours as needed for wheezing.  150 mL  1  . amLODipine (NORVASC) 10 MG tablet Take 1 tablet (10 mg total) by mouth daily.  90 tablet  3  . aspirin 81 MG chewable tablet Chew 81 mg by mouth daily.       Marland Kitchen atorvastatin (LIPITOR) 80 MG tablet Take 1 tablet (80 mg total) by mouth at bedtime.  90 tablet  3  . Blood Glucose Monitoring Suppl (ONE TOUCH ULTRA 2) W/DEVICE KIT Use twice daily yo check blood sugar.  1 each  0  . Calcium Carbonate-Vitamin D (CALCIUM 600+D) 600-200 MG-UNIT TABS Take 1 tablet by mouth daily.       . carvedilol (COREG) 6.25 MG tablet Take 1 tablet  (6.25 mg total) by mouth 2 (two) times daily with a meal.  180 tablet  3  . chlorhexidine (PERIDEX) 0.12 % solution       . ciclopirox (PENLAC) 8 % solution Apply topically at bedtime. Apply over nail and surrounding skin. Apply daily over previous coat. After seven (7) days, may remove with alcohol and continue cycle.  6.6 mL  0  . CINNAMON PO Take 1 tablet by mouth daily.       . furosemide (LASIX) 20 MG tablet Take 20 mg by mouth 2 (two) times daily.      Marland Kitchen gabapentin (NEURONTIN) 100 MG capsule Take 1 capsule (100 mg total) by mouth at bedtime.  90 capsule  3  . glipiZIDE (GLUCOTROL) 2.5 mg TABS Take 0.5 tablets (2.5 mg total) by mouth daily before breakfast.  90 tablet  3  . glucose blood test strip Use as instructed  100 each  12  . losartan (COZAAR) 50 MG tablet Take 50 mg by mouth daily.      . Omega-3 Fatty Acids (FISH OIL) 1000 MG CAPS Take 1 capsule by mouth 2 (two) times daily.       Marland Kitchen omeprazole (PRILOSEC) 20 MG capsule Take 1 capsule by mouth  daily as needed for acid  reflux  90 capsule  3  . ONETOUCH DELICA LANCETS 33G MISC 1 each by Does not apply route 2 (two) times daily.  100 each  12  . oxyCODONE-acetaminophen (PERCOCET/ROXICET) 5-325 MG per tablet       . penicillin v potassium (VEETID) 500 MG tablet Take 1 tablet (500 mg total) by mouth 4 (four) times daily.  28 tablet  0  . triamcinolone (KENALOG) 0.025 % ointment Apply topically 2 (two) times daily.  30 g  1  . warfarin (COUMADIN) 5 MG tablet Take 1 to 1 and 1/2 tablet  by mouth daily  135 tablet  0      Review of Systems:  Pertinent items are noted in HPI.     Objective:   Physical Exam: BP 108/67  Pulse 74  Ht 5\' 7"  (1.702 m)  Wt 141 lb (63.957 kg)  BMI 22.08 kg/m2   General: alert, well appearing, and in no distress, oriented to person, place, and time and normal appearing weight Eyes: deferred Cardiovascular: RRR, nl S1 and S2 Pulmonary: clear to auscultation bilaterally   Labs:   Diabetic Labs:   Lab Results  Component Value Date   HGBA1C 7.1 04/27/2013   HGBA1C 6.5 11/11/2012   HGBA1C 6.2 08/05/2012   Lab Results  Component Value Date   LDLCALC 66 01/19/2013   CREATININE 1.42* 01/19/2013   Last microalbumin: No results found for this basename: MICROALBUR, [id number]        Assessment & Plan:

## 2013-04-27 NOTE — Patient Instructions (Signed)
[name],   It was great to see you. Please read below.   1. Diabetes - Yourr A1C is 7.1. Please start back on the glipizide at 2.5 mg with breakfast and with dinner.  2. Weight Loss - Let's check your nutrition status and then have you follow up after your surgery to discuss your appetite.   For you husband his total cholesterol is 137 and his LDL is 76.   Please come back after your surgery.   Sincerely,   Dr. Clinton Sawyer

## 2013-04-28 ENCOUNTER — Encounter: Payer: Self-pay | Admitting: Family Medicine

## 2013-04-28 LAB — PREALBUMIN: Prealbumin: 36.4 mg/dL — ABNORMAL HIGH (ref 17.0–34.0)

## 2013-04-30 DIAGNOSIS — R634 Abnormal weight loss: Secondary | ICD-10-CM | POA: Insufficient documentation

## 2013-04-30 NOTE — Assessment & Plan Note (Signed)
Assessment: slightly worsening glycemic control to 7.1, which is of great concern to patient who would prefer to have an A1C < 7.0 Plan: discussed restarting glipizide at 2.5 mg BID and need to avoid hypoglycemia

## 2013-04-30 NOTE — Assessment & Plan Note (Addendum)
Assessment: after patient left it was determined that clinic scale had not been calibrated for the day, thus today's weight is not correct, still there is concern for decreased PO intake causing weight loss which could be related to anxiety given no evidence of new systemic illness at this point   Plan: check nutritional status with pre-albumin and have patient follow up in 4 weeks for another weight check

## 2013-05-03 ENCOUNTER — Telehealth: Payer: Self-pay | Admitting: Pharmacist Clinician (PhC)/ Clinical Pharmacy Specialist

## 2013-05-03 NOTE — Telephone Encounter (Signed)
Please call her

## 2013-05-03 NOTE — Telephone Encounter (Signed)
Pt wants to take Tylenol Arthristis.  Informed her ok to take (acetaminophen 650mg ), but no more than 2 tabs per day.  Pt voiced understanding.

## 2013-05-05 ENCOUNTER — Encounter (HOSPITAL_COMMUNITY)
Admission: RE | Admit: 2013-05-05 | Discharge: 2013-05-05 | Disposition: A | Discharge status: Home / Self Care | Payer: PRIVATE HEALTH INSURANCE | Source: Ambulatory Visit | Attending: Neurosurgery | Admitting: Neurosurgery

## 2013-05-05 ENCOUNTER — Encounter (HOSPITAL_COMMUNITY): Payer: Self-pay

## 2013-05-05 ENCOUNTER — Encounter (HOSPITAL_COMMUNITY): Payer: Self-pay | Admitting: Pharmacy Technician

## 2013-05-05 DIAGNOSIS — Z01812 Encounter for preprocedural laboratory examination: Secondary | ICD-10-CM | POA: Insufficient documentation

## 2013-05-05 HISTORY — DX: Shortness of breath: R06.02

## 2013-05-05 HISTORY — DX: Unspecified atrial fibrillation: I48.91

## 2013-05-05 HISTORY — DX: Left bundle-branch block, unspecified: I44.7

## 2013-05-05 HISTORY — DX: Personal history of other diseases of the digestive system: Z87.19

## 2013-05-05 HISTORY — DX: Gastro-esophageal reflux disease without esophagitis: K21.9

## 2013-05-05 LAB — CBC
MCHC: 33.2 g/dL (ref 30.0–36.0)
MCV: 95.9 fL (ref 78.0–100.0)
Platelets: 173 10*3/uL (ref 150–400)
RDW: 14.1 % (ref 11.5–15.5)
WBC: 9.4 10*3/uL (ref 4.0–10.5)

## 2013-05-05 LAB — BASIC METABOLIC PANEL
CO2: 25 mEq/L (ref 19–32)
Calcium: 10 mg/dL (ref 8.4–10.5)
Creatinine, Ser: 1.38 mg/dL — ABNORMAL HIGH (ref 0.50–1.10)
GFR calc Af Amer: 40 mL/min — ABNORMAL LOW (ref >90)
Glucose, Bld: 111 mg/dL — ABNORMAL HIGH (ref 70–99)

## 2013-05-05 LAB — PROTIME-INR: INR: 2.47 — ABNORMAL HIGH (ref 0.00–1.49)

## 2013-05-05 LAB — APTT: aPTT: 37 seconds (ref 24–37)

## 2013-05-05 LAB — SURGICAL PCR SCREEN
MRSA, PCR: NEGATIVE
Staphylococcus aureus: NEGATIVE

## 2013-05-06 ENCOUNTER — Encounter (HOSPITAL_COMMUNITY): Payer: Self-pay

## 2013-05-06 NOTE — Progress Notes (Signed)
Anesthesia Chart Review:  Patient is a 79 year old female scheduled for left L4-5 laminotomy and microdiskectomy on 05/12/13 by Dr. Newell Coral.  History includes passive smoke exposure, anemia, DM2, HTN, HLD, renal tumor (complex right renal cyst) with no further testing per 09/15/12 [facility] note, CKD stage III-IV followed by Dr. Arrie Aran at [facility], CHF, diastolic dysfunction, afib, left BBB, COPD, GERD, hiatal hernia, venous insufficiency. PCP is with [facility]. Cardiologist Dr. Cyndy Freeze is aware of plans for surgery and cleared her with permission to hold Coumadin.   Echo on 10/12/11 showed: - Left ventricle: The cavity size was normal. Wall thickness was normal. Systolic function was normal. The estimated ejection fraction was in the range of 60% to 65%. Although no diagnostic regional wall motion abnormality was identified, this possibility cannot be completely excluded on the basis of this study. Atrial fibrillation limits evaluation of LV diastolic function. - Ventricular septum: Septal motion showed paradox. - Mitral valve: Moderately to severely calcified, mildly thickened annulus. Mild regurgitation. - Left atrium: The atrium was moderately dilated. - Right atrium: The atrium was mildly dilated. - Atrial septum: No defect or patent foramen ovale was identified. - Pericardium, extracardiac: A small, free-flowing pericardial effusion was identified circumferential to the heart. The fluid had no internal echoes.  Cardiac cath on 08/12/11 showed:  1. Calcified coronary artery artery is of large caliber without significant stenosis.  2. Nonischemic cardiomyopathy, secondary to atrial fibrillation and dyssynchrony due to left bundle branch block. (EF 40-45% by prior echo.) Plan: Medical management of nonischemic cardiomyopathy.  Preoperative labs noted.  Repeat PT/INR on the day of surgery.   She is for a CXR on the day of surgery.  EKG was requested from  Dr. Blanchie Dessert office, but if none received or > 1 year ago then this will also need to be done on the day of surgery. HR was 84 bpm at her PAT visit.  Her Cr appears stable.  She has been cleared by her cardiologist.  If follow-up INR is reasonable and no acute changes then I would anticipate that she could proceed as planned.   Velna Ochs [facility]/Anesthesiology Phone [phone] 05/06/2013 12:30 PM

## 2013-05-09 ENCOUNTER — Other Ambulatory Visit: Payer: Self-pay

## 2013-05-09 ENCOUNTER — Other Ambulatory Visit: Payer: Self-pay | Admitting: Family Medicine

## 2013-05-09 ENCOUNTER — Other Ambulatory Visit: Payer: Self-pay | Admitting: Internal Medicine

## 2013-05-09 ENCOUNTER — Ambulatory Visit: Payer: PRIVATE HEALTH INSURANCE | Admitting: Pharmacist Clinician (PhC)/ Clinical Pharmacy Specialist

## 2013-05-09 ENCOUNTER — Telehealth: Payer: Self-pay | Admitting: Family Medicine

## 2013-05-09 ENCOUNTER — Telehealth: Payer: Self-pay

## 2013-05-09 DIAGNOSIS — E119 Type 2 diabetes mellitus without complications: Secondary | ICD-10-CM

## 2013-05-09 MED ORDER — LOSARTAN POTASSIUM 50 MG PO TABS: 50.0000 mg | ORAL_TABLET | Freq: Every day | ORAL | Status: DC

## 2013-05-09 MED ORDER — FUROSEMIDE 20 MG PO TABS: 20.0000 mg | ORAL_TABLET | Freq: Every day | ORAL | Status: DC

## 2013-05-09 MED ORDER — WARFARIN SODIUM 5 MG PO TABS: ORAL_TABLET | ORAL | Status: DC

## 2013-05-09 MED ORDER — GLIPIZIDE ER 2.5 MG PO TB24: 2.5000 mg | ORAL_TABLET | Freq: Every day | ORAL | Status: DC

## 2013-05-09 NOTE — Telephone Encounter (Signed)
Please call patient and inform her that I have refilled glipizide. She should only take 1 tablet a day because it is the long-acting formula. She should not take 2 tablets.

## 2013-05-09 NOTE — Telephone Encounter (Signed)
Will FWD to PCP.  Khaleelah Yowell L, CMA  

## 2013-05-09 NOTE — Telephone Encounter (Signed)
Pt informed.  [name] L, CMA  

## 2013-05-09 NOTE — Telephone Encounter (Signed)
Refill request for glipizide.

## 2013-05-09 NOTE — Telephone Encounter (Signed)
Rx(s) for Losartan and Lasix sent to pharmacy electronically.  Rx for Warfarin routed to Phillips Hay, RPh for approval.

## 2013-05-09 NOTE — Telephone Encounter (Signed)
Pt called because she needs her Glipizide sent to [facility] on [address] instead of the online pharmacy. JW

## 2013-05-09 NOTE — Telephone Encounter (Signed)
Patient called and informed that a prescription will be sent to [facility]. Additionally she was told if she wants [place] in all of her prescriptions to [facility] she can call the mail-in pharmacy and they will transfer the prescriptions. She is agreeable to this plan.

## 2013-05-10 ENCOUNTER — Telehealth: Payer: Self-pay | Admitting: Family Medicine

## 2013-05-10 NOTE — Telephone Encounter (Signed)
Pt wants dr Burnard Bunting to send a list of all her medicines to [facility] on [address]. It was explained to her the previous note from dr Clinton Sawyer. She still wanted the list sent to [facility].

## 2013-05-10 NOTE — Telephone Encounter (Signed)
I have already explained that she can call the pharmacy where her prescriptions are and ask that they be sent to [facility]. Please call and ask if she has done this.

## 2013-05-11 MED ORDER — CEFAZOLIN SODIUM-DEXTROSE 2-3 GM-% IV SOLR
2.0000 g | INTRAVENOUS | Status: AC
  Administered 2013-05-12: 2 g via INTRAVENOUS
  Filled 2013-05-11: qty 50

## 2013-05-12 ENCOUNTER — Inpatient Hospital Stay (HOSPITAL_COMMUNITY): Payer: PRIVATE HEALTH INSURANCE | Admitting: Anesthesiology

## 2013-05-12 ENCOUNTER — Inpatient Hospital Stay (HOSPITAL_COMMUNITY): Payer: PRIVATE HEALTH INSURANCE

## 2013-05-12 ENCOUNTER — Encounter (HOSPITAL_COMMUNITY): Payer: Self-pay | Admitting: *Deleted

## 2013-05-12 ENCOUNTER — Encounter (HOSPITAL_COMMUNITY): Payer: PRIVATE HEALTH INSURANCE | Admitting: Vascular Surgery

## 2013-05-12 ENCOUNTER — Encounter (HOSPITAL_COMMUNITY)
Admission: RE | Disposition: A | Discharge status: Home / Self Care | Payer: Self-pay | Source: Ambulatory Visit | Attending: Neurosurgery

## 2013-05-12 ENCOUNTER — Inpatient Hospital Stay (HOSPITAL_COMMUNITY)
Admission: RE | Admit: 2013-05-12 | Discharge: 2013-05-13 | DRG: 520 | Disposition: A | Discharge status: Home / Self Care | Payer: PRIVATE HEALTH INSURANCE | Source: Ambulatory Visit | Attending: Neurosurgery | Admitting: Neurosurgery

## 2013-05-12 DIAGNOSIS — E119 Type 2 diabetes mellitus without complications: Secondary | ICD-10-CM | POA: Diagnosis present

## 2013-05-12 DIAGNOSIS — M5126 Other intervertebral disc displacement, lumbar region: Principal | ICD-10-CM | POA: Diagnosis present

## 2013-05-12 DIAGNOSIS — Z9089 Acquired absence of other organs: Secondary | ICD-10-CM

## 2013-05-12 DIAGNOSIS — Z9849 Cataract extraction status, unspecified eye: Secondary | ICD-10-CM

## 2013-05-12 DIAGNOSIS — N183 Chronic kidney disease, stage 3 unspecified: Secondary | ICD-10-CM | POA: Diagnosis present

## 2013-05-12 DIAGNOSIS — K219 Gastro-esophageal reflux disease without esophagitis: Secondary | ICD-10-CM | POA: Diagnosis present

## 2013-05-12 DIAGNOSIS — Z8261 Family history of arthritis: Secondary | ICD-10-CM

## 2013-05-12 DIAGNOSIS — G609 Hereditary and idiopathic neuropathy, unspecified: Secondary | ICD-10-CM | POA: Diagnosis present

## 2013-05-12 DIAGNOSIS — E785 Hyperlipidemia, unspecified: Secondary | ICD-10-CM | POA: Diagnosis present

## 2013-05-12 DIAGNOSIS — M51379 Other intervertebral disc degeneration, lumbosacral region without mention of lumbar back pain or lower extremity pain: Secondary | ICD-10-CM | POA: Diagnosis present

## 2013-05-12 DIAGNOSIS — M47817 Spondylosis without myelopathy or radiculopathy, lumbosacral region: Secondary | ICD-10-CM | POA: Diagnosis present

## 2013-05-12 DIAGNOSIS — Z7982 Long term (current) use of aspirin: Secondary | ICD-10-CM

## 2013-05-12 DIAGNOSIS — Z7901 Long term (current) use of anticoagulants: Secondary | ICD-10-CM

## 2013-05-12 DIAGNOSIS — J4489 Other specified chronic obstructive pulmonary disease: Secondary | ICD-10-CM | POA: Diagnosis present

## 2013-05-12 DIAGNOSIS — Z79899 Other long term (current) drug therapy: Secondary | ICD-10-CM

## 2013-05-12 DIAGNOSIS — M5137 Other intervertebral disc degeneration, lumbosacral region: Secondary | ICD-10-CM | POA: Diagnosis present

## 2013-05-12 DIAGNOSIS — G8929 Other chronic pain: Secondary | ICD-10-CM | POA: Diagnosis present

## 2013-05-12 DIAGNOSIS — I129 Hypertensive chronic kidney disease with stage 1 through stage 4 chronic kidney disease, or unspecified chronic kidney disease: Secondary | ICD-10-CM | POA: Diagnosis present

## 2013-05-12 DIAGNOSIS — D631 Anemia in chronic kidney disease: Secondary | ICD-10-CM | POA: Diagnosis present

## 2013-05-12 DIAGNOSIS — Z961 Presence of intraocular lens: Secondary | ICD-10-CM

## 2013-05-12 DIAGNOSIS — Z8249 Family history of ischemic heart disease and other diseases of the circulatory system: Secondary | ICD-10-CM

## 2013-05-12 DIAGNOSIS — J449 Chronic obstructive pulmonary disease, unspecified: Secondary | ICD-10-CM | POA: Diagnosis present

## 2013-05-12 HISTORY — PX: LUMBAR LAMINECTOMY/DECOMPRESSION MICRODISCECTOMY: SHX5026

## 2013-05-12 LAB — GLUCOSE, CAPILLARY
Glucose-Capillary: 116 mg/dL — ABNORMAL HIGH (ref 70–99)
Glucose-Capillary: 166 mg/dL — ABNORMAL HIGH (ref 70–99)

## 2013-05-12 SURGERY — LUMBAR LAMINECTOMY/DECOMPRESSION MICRODISCECTOMY 1 LEVEL
Anesthesia: General | Site: Back | Laterality: Left | Wound class: Clean

## 2013-05-12 MED ORDER — MENTHOL 3 MG MT LOZG: 1.0000 | LOZENGE | OROMUCOSAL | Status: DC | PRN

## 2013-05-12 MED ORDER — ACETAMINOPHEN 325 MG PO TABS: 650.0000 mg | ORAL_TABLET | ORAL | Status: DC | PRN

## 2013-05-12 MED ORDER — FENTANYL CITRATE 0.05 MG/ML IJ SOLN
INTRAMUSCULAR | Status: AC
  Filled 2013-05-12: qty 2

## 2013-05-12 MED ORDER — TRAMADOL HCL 50 MG PO TABS
100.0000 mg | ORAL_TABLET | Freq: Four times a day (QID) | ORAL | Status: DC | PRN
  Administered 2013-05-13: 100 mg via ORAL
  Filled 2013-05-12: qty 2

## 2013-05-12 MED ORDER — ZOLPIDEM TARTRATE 5 MG PO TABS: 5.0000 mg | ORAL_TABLET | Freq: Every evening | ORAL | Status: DC | PRN

## 2013-05-12 MED ORDER — OXYCODONE-ACETAMINOPHEN 5-325 MG PO TABS: 1.0000 | ORAL_TABLET | ORAL | Status: DC | PRN

## 2013-05-12 MED ORDER — PROMETHAZINE HCL 25 MG/ML IJ SOLN
INTRAMUSCULAR | Status: AC
  Filled 2013-05-12: qty 1

## 2013-05-12 MED ORDER — ALUM & MAG HYDROXIDE-SIMETH 200-200-20 MG/5ML PO SUSP: 30.0000 mL | Freq: Four times a day (QID) | ORAL | Status: DC | PRN

## 2013-05-12 MED ORDER — METHYLPREDNISOLONE ACETATE 80 MG/ML IJ SUSP
INTRAMUSCULAR | Status: DC | PRN
  Administered 2013-05-12: 80 mg via INTRALESIONAL

## 2013-05-12 MED ORDER — AMLODIPINE BESYLATE 10 MG PO TABS
10.0000 mg | ORAL_TABLET | Freq: Every day | ORAL | Status: DC
  Administered 2013-05-13: 10 mg via ORAL
  Filled 2013-05-12: qty 1

## 2013-05-12 MED ORDER — SODIUM CHLORIDE 0.9 % IV SOLN
INTRAVENOUS | Status: DC
  Administered 2013-05-12: 10 mL/h via INTRAVENOUS

## 2013-05-12 MED ORDER — FENTANYL CITRATE 0.05 MG/ML IJ SOLN
INTRAMUSCULAR | Status: DC | PRN
  Administered 2013-05-12: 100 ug via INTRAVENOUS

## 2013-05-12 MED ORDER — NEOSTIGMINE METHYLSULFATE 1 MG/ML IJ SOLN
INTRAMUSCULAR | Status: DC | PRN
  Administered 2013-05-12: 3 mg via INTRAVENOUS

## 2013-05-12 MED ORDER — BUPIVACAINE HCL (PF) 0.5 % IJ SOLN
INTRAMUSCULAR | Status: DC | PRN
  Administered 2013-05-12: 15 mL

## 2013-05-12 MED ORDER — DOCUSATE SODIUM 100 MG PO CAPS
200.0000 mg | ORAL_CAPSULE | Freq: Two times a day (BID) | ORAL | Status: DC
  Administered 2013-05-12 – 2013-05-13 (×2): 200 mg via ORAL
  Filled 2013-05-12 (×3): qty 2

## 2013-05-12 MED ORDER — LIDOCAINE-EPINEPHRINE 1 %-1:100000 IJ SOLN
INTRAMUSCULAR | Status: DC | PRN
  Administered 2013-05-12: 15 mL

## 2013-05-12 MED ORDER — SODIUM CHLORIDE 0.9 % IV SOLN: 250.0000 mL | INTRAVENOUS | Status: DC

## 2013-05-12 MED ORDER — SODIUM CHLORIDE 0.9 % IJ SOLN
3.0000 mL | Freq: Two times a day (BID) | INTRAMUSCULAR | Status: DC
  Administered 2013-05-12 – 2013-05-13 (×2): 3 mL via INTRAVENOUS

## 2013-05-12 MED ORDER — MORPHINE SULFATE 4 MG/ML IJ SOLN: 4.0000 mg | INTRAMUSCULAR | Status: DC | PRN

## 2013-05-12 MED ORDER — HYDROXYZINE HCL 25 MG PO TABS: 50.0000 mg | ORAL_TABLET | ORAL | Status: DC | PRN

## 2013-05-12 MED ORDER — LOSARTAN POTASSIUM 50 MG PO TABS
50.0000 mg | ORAL_TABLET | Freq: Every day | ORAL | Status: DC
  Administered 2013-05-12 – 2013-05-13 (×2): 50 mg via ORAL
  Filled 2013-05-12 (×2): qty 1

## 2013-05-12 MED ORDER — PANTOPRAZOLE SODIUM 40 MG PO TBEC
40.0000 mg | DELAYED_RELEASE_TABLET | Freq: Every day | ORAL | Status: DC
  Administered 2013-05-13: 40 mg via ORAL
  Filled 2013-05-12: qty 1

## 2013-05-12 MED ORDER — GLYCOPYRROLATE 0.2 MG/ML IJ SOLN
INTRAMUSCULAR | Status: DC | PRN
  Administered 2013-05-12: 0.4 mg via INTRAVENOUS

## 2013-05-12 MED ORDER — ATORVASTATIN CALCIUM 80 MG PO TABS
80.0000 mg | ORAL_TABLET | Freq: Every day | ORAL | Status: DC
  Administered 2013-05-12: 80 mg via ORAL
  Filled 2013-05-12 (×2): qty 1

## 2013-05-12 MED ORDER — FENTANYL CITRATE 0.05 MG/ML IJ SOLN
25.0000 ug | INTRAMUSCULAR | Status: DC | PRN
  Administered 2013-05-12: 25 ug via INTRAVENOUS

## 2013-05-12 MED ORDER — FUROSEMIDE 20 MG PO TABS
20.0000 mg | ORAL_TABLET | Freq: Every day | ORAL | Status: DC
  Administered 2013-05-12 – 2013-05-13 (×2): 20 mg via ORAL
  Filled 2013-05-12 (×2): qty 1

## 2013-05-12 MED ORDER — MAGNESIUM HYDROXIDE 400 MG/5ML PO SUSP: 30.0000 mL | Freq: Every day | ORAL | Status: DC | PRN

## 2013-05-12 MED ORDER — LIDOCAINE HCL (CARDIAC) 20 MG/ML IV SOLN
INTRAVENOUS | Status: DC | PRN
  Administered 2013-05-12: 50 mg via INTRAVENOUS

## 2013-05-12 MED ORDER — KCL IN DEXTROSE-NACL 20-5-0.45 MEQ/L-%-% IV SOLN: INTRAVENOUS | Status: DC

## 2013-05-12 MED ORDER — ACETAMINOPHEN 650 MG RE SUPP: 650.0000 mg | RECTAL | Status: DC | PRN

## 2013-05-12 MED ORDER — PROPOFOL 10 MG/ML IV BOLUS
INTRAVENOUS | Status: DC | PRN
  Administered 2013-05-12: 80 mg via INTRAVENOUS

## 2013-05-12 MED ORDER — SODIUM CHLORIDE 0.9 % IR SOLN
Status: DC | PRN
  Administered 2013-05-12: 13:00:00

## 2013-05-12 MED ORDER — ALBUTEROL SULFATE HFA 108 (90 BASE) MCG/ACT IN AERS
2.0000 | INHALATION_SPRAY | Freq: Four times a day (QID) | RESPIRATORY_TRACT | Status: DC | PRN
  Filled 2013-05-12: qty 6.7

## 2013-05-12 MED ORDER — GLIPIZIDE ER 2.5 MG PO TB24: 2.5000 mg | ORAL_TABLET | Freq: Every day | ORAL | Status: DC

## 2013-05-12 MED ORDER — ONDANSETRON HCL 4 MG/2ML IJ SOLN
INTRAMUSCULAR | Status: DC | PRN
  Administered 2013-05-12: 4 mg via INTRAVENOUS

## 2013-05-12 MED ORDER — HEMOSTATIC AGENTS (NO CHARGE) OPTIME
TOPICAL | Status: DC | PRN
  Administered 2013-05-12: 1 via TOPICAL

## 2013-05-12 MED ORDER — PROMETHAZINE HCL 25 MG/ML IJ SOLN
6.2500 mg | INTRAMUSCULAR | Status: DC | PRN
Start: 2013-05-12 — End: 2013-05-12
  Administered 2013-05-12: 6.25 mg via INTRAVENOUS

## 2013-05-12 MED ORDER — 0.9 % SODIUM CHLORIDE (POUR BTL) OPTIME
TOPICAL | Status: DC | PRN
  Administered 2013-05-12: 1000 mL

## 2013-05-12 MED ORDER — CARVEDILOL 6.25 MG PO TABS
6.2500 mg | ORAL_TABLET | Freq: Two times a day (BID) | ORAL | Status: DC
  Administered 2013-05-12 – 2013-05-13 (×2): 6.25 mg via ORAL
  Filled 2013-05-12 (×4): qty 1

## 2013-05-12 MED ORDER — ROCURONIUM BROMIDE 100 MG/10ML IV SOLN
INTRAVENOUS | Status: DC | PRN
  Administered 2013-05-12: 30 mg via INTRAVENOUS

## 2013-05-12 MED ORDER — PHENOL 1.4 % MT LIQD: 1.0000 | OROMUCOSAL | Status: DC | PRN

## 2013-05-12 MED ORDER — PHENYLEPHRINE HCL 10 MG/ML IJ SOLN
INTRAMUSCULAR | Status: DC | PRN
  Administered 2013-05-12: 80 ug via INTRAVENOUS
  Administered 2013-05-12: 40 ug via INTRAVENOUS

## 2013-05-12 MED ORDER — ONDANSETRON HCL 4 MG/2ML IJ SOLN: 4.0000 mg | Freq: Four times a day (QID) | INTRAMUSCULAR | Status: DC | PRN

## 2013-05-12 MED ORDER — FENTANYL CITRATE 0.05 MG/ML IJ SOLN
INTRAMUSCULAR | Status: DC | PRN
  Administered 2013-05-12 (×3): 50 ug via INTRAVENOUS

## 2013-05-12 MED ORDER — HYDROCODONE-ACETAMINOPHEN 5-325 MG PO TABS: 1.0000 | ORAL_TABLET | ORAL | Status: DC | PRN

## 2013-05-12 MED ORDER — GLIPIZIDE ER 2.5 MG PO TB24
2.5000 mg | ORAL_TABLET | Freq: Every day | ORAL | Status: DC
  Administered 2013-05-13: 2.5 mg via ORAL
  Filled 2013-05-12 (×2): qty 1

## 2013-05-12 MED ORDER — CYCLOBENZAPRINE HCL 10 MG PO TABS: 10.0000 mg | ORAL_TABLET | Freq: Three times a day (TID) | ORAL | Status: DC | PRN

## 2013-05-12 MED ORDER — SODIUM CHLORIDE 0.9 % IV SOLN: INTRAVENOUS | Status: DC

## 2013-05-12 MED ORDER — THROMBIN 5000 UNITS EX SOLR
CUTANEOUS | Status: DC | PRN
  Administered 2013-05-12 (×2): 5000 [IU] via TOPICAL

## 2013-05-12 MED ORDER — BISACODYL 10 MG RE SUPP: 10.0000 mg | Freq: Every day | RECTAL | Status: DC | PRN

## 2013-05-12 MED ORDER — SODIUM CHLORIDE 0.9 % IJ SOLN: 3.0000 mL | INTRAMUSCULAR | Status: DC | PRN

## 2013-05-12 SURGICAL SUPPLY — 62 items
1% LIDOCAINE WITH EPI 1:100,000 ×1 IMPLANT
ADH SKN CLS APL DERMABOND .7 (GAUZE/BANDAGES/DRESSINGS) ×1
APL SKNCLS STERI-STRIP NONHPOA (GAUZE/BANDAGES/DRESSINGS)
BAG DECANTER FOR FLEXI CONT (MISCELLANEOUS) ×2 IMPLANT
BENZOIN TINCTURE PRP APPL 2/3 (GAUZE/BANDAGES/DRESSINGS) IMPLANT
BLADE SURG ROTATE 9660 (MISCELLANEOUS) IMPLANT
BRUSH SCRUB EZ PLAIN DRY (MISCELLANEOUS) ×2 IMPLANT
BUR ACORN 6.0 ACORN (BURR) ×1 IMPLANT
BUR ACRON 5.0MM COATED (BURR) ×1 IMPLANT
BUR MATCHSTICK NEURO 3.0 LAGG (BURR) ×2 IMPLANT
CANISTER SUCT 3000ML (MISCELLANEOUS) ×2 IMPLANT
CONT SPEC 4OZ CLIKSEAL STRL BL (MISCELLANEOUS) ×1 IMPLANT
DERMABOND ADVANCED (GAUZE/BANDAGES/DRESSINGS) ×1
DERMABOND ADVANCED .7 DNX12 (GAUZE/BANDAGES/DRESSINGS) IMPLANT
DRAPE LAPAROTOMY 100X72X124 (DRAPES) ×2 IMPLANT
DRAPE MICROSCOPE LEICA (MISCELLANEOUS) ×2 IMPLANT
DRAPE POUCH INSTRU U-SHP 10X18 (DRAPES) ×2 IMPLANT
DRSG EMULSION OIL 3X3 NADH (GAUZE/BANDAGES/DRESSINGS) IMPLANT
ELECT REM PT RETURN 9FT ADLT (ELECTROSURGICAL) ×2
ELECTRODE REM PT RTRN 9FT ADLT (ELECTROSURGICAL) ×1 IMPLANT
GAUZE SPONGE 4X4 16PLY XRAY LF (GAUZE/BANDAGES/DRESSINGS) IMPLANT
GLOVE BIOGEL PI IND STRL 8 (GLOVE) ×1 IMPLANT
GLOVE BIOGEL PI IND STRL 8.5 (GLOVE) IMPLANT
GLOVE BIOGEL PI INDICATOR 8 (GLOVE) ×1
GLOVE BIOGEL PI INDICATOR 8.5 (GLOVE) ×1
GLOVE ECLIPSE 7.5 STRL STRAW (GLOVE) ×2 IMPLANT
GLOVE ECLIPSE 8.0 STRL XLNG CF (GLOVE) ×1 IMPLANT
GLOVE EXAM NITRILE LRG STRL (GLOVE) IMPLANT
GLOVE EXAM NITRILE MD LF STRL (GLOVE) IMPLANT
GLOVE EXAM NITRILE XL STR (GLOVE) IMPLANT
GLOVE EXAM NITRILE XS STR PU (GLOVE) IMPLANT
GLOVE SURG SS PI 8.0 STRL IVOR (GLOVE) ×2 IMPLANT
GOWN BRE IMP SLV AUR LG STRL (GOWN DISPOSABLE) ×2 IMPLANT
GOWN BRE IMP SLV AUR XL STRL (GOWN DISPOSABLE) ×1 IMPLANT
GOWN STRL REIN 2XL LVL4 (GOWN DISPOSABLE) ×1 IMPLANT
KIT BASIN OR (CUSTOM PROCEDURE TRAY) ×2 IMPLANT
KIT ROOM TURNOVER OR (KITS) ×2 IMPLANT
NDL HYPO 18GX1.5 BLUNT FILL (NEEDLE) IMPLANT
NDL SPNL 18GX3.5 QUINCKE PK (NEEDLE) ×1 IMPLANT
NDL SPNL 22GX3.5 QUINCKE BK (NEEDLE) ×1 IMPLANT
NEEDLE HYPO 18GX1.5 BLUNT FILL (NEEDLE) ×2 IMPLANT
NEEDLE SPNL 18GX3.5 QUINCKE PK (NEEDLE) ×2 IMPLANT
NEEDLE SPNL 22GX3.5 QUINCKE BK (NEEDLE) ×2 IMPLANT
NS IRRIG 1000ML POUR BTL (IV SOLUTION) ×2 IMPLANT
PACK LAMINECTOMY NEURO (CUSTOM PROCEDURE TRAY) ×2 IMPLANT
PAD ARMBOARD 7.5X6 YLW CONV (MISCELLANEOUS) ×6 IMPLANT
PATTIES SURGICAL .5 X1 (DISPOSABLE) ×1 IMPLANT
RUBBERBAND STERILE (MISCELLANEOUS) ×4 IMPLANT
SPONGE GAUZE 4X4 12PLY (GAUZE/BANDAGES/DRESSINGS) ×1 IMPLANT
SPONGE LAP 4X18 X RAY DECT (DISPOSABLE) IMPLANT
SPONGE SURGIFOAM ABS GEL SZ50 (HEMOSTASIS) ×2 IMPLANT
STRIP CLOSURE SKIN 1/2X4 (GAUZE/BANDAGES/DRESSINGS) IMPLANT
SUT PROLENE 6 0 BV (SUTURE) IMPLANT
SUT VIC AB 1 CT1 18XBRD ANBCTR (SUTURE) ×1 IMPLANT
SUT VIC AB 1 CT1 8-18 (SUTURE) ×2
SUT VIC AB 2-0 CP2 18 (SUTURE) ×3 IMPLANT
SUT VIC AB 3-0 SH 8-18 (SUTURE) ×1 IMPLANT
SYR 20ML ECCENTRIC (SYRINGE) ×2 IMPLANT
SYR 5ML LL (SYRINGE) ×1 IMPLANT
TOWEL OR 17X24 6PK STRL BLUE (TOWEL DISPOSABLE) ×2 IMPLANT
TOWEL OR 17X26 10 PK STRL BLUE (TOWEL DISPOSABLE) ×2 IMPLANT
WATER STERILE IRR 1000ML POUR (IV SOLUTION) ×2 IMPLANT

## 2013-05-12 NOTE — H&P (Signed)
Subjective: Patient is a 79 y.o. female who is admitted for treatment of left L4-5 lumbar disc herniation, the patient was lumbar degenerative disease and lumbar spondylosis at multiple levels of the lumbar spine, with varying degrees of multilevel multifactorial lumbar stenosis. However she presents with left lumbar radicular pain radiating to the lateral left hip, thigh, and leg. She does have some left-sided low back pain as well, but the worse the pain is in the left lower extremity. Patient had treated with spinal injections without relief. She is admitted now for a left L4-5 lumbar laminotomy and microdiscectomy.   Patient Active Problem List   Diagnosis Date Noted  . Loss of weight 04/30/2013  . Encounter for preventive health examination 12/28/2012  . Actinic keratosis 06/29/2012  . Onychomycosis of toenail 04/21/2012  . Secondary hyperparathyroidism 11/10/2011  . History of anemia 11/10/2011  . Warfarin anticoagulation 06/09/2011  . Campath-induced atrial fibrillation 02/06/2011  . Hypercalcemia 09/30/2010  . LOW BACK PAIN, CHRONIC 05/02/2010  . Chronic obstructive airway disease with asthma 01/04/2010  . RENAL DISEASE, CHRONIC, STAGE III 01/26/2007  . DIABETES MELLITUS II, UNCOMPLICATED 08/27/2006  . HYPERLIPIDEMIA 08/27/2006  . NEUROPATHY, PERIPHERAL 08/27/2006  . HYPERTENSION, BENIGN SYSTEMIC 08/27/2006  . OSTEOARTHRITIS, MULTI SITES 08/27/2006   Past Medical History  Diagnosis Date  . Anemia     anemia of renal disease  . Diabetes mellitus   . Hypertension   . Chronic kidney disease     Stage III- followed by [facility]  . CHF (congestive heart failure)     Diastolic dys-- EF 60% july 2011  . Hyperlipidemia   . Arthritis     mutiple joints  . Cancer     Renal neoplasm- survellience via Urology  . Peripheral neuropathy   . COPD (chronic obstructive pulmonary disease)   . Pneumonia   . DIASTOLIC DYSFUNCTION 01/22/2010    Qualifier: Diagnosis of  By: Jeanice Lim  MD, Kingsley Spittle    . NEOPLASM, KIDNEY 03/04/2007    complex right renal cyst; Per patient- Urologist no longer following- no changes.      . Venous insufficiency 02/24/2011  . Shortness of breath     Hx: of with exertion  . GERD (gastroesophageal reflux disease)   . H/O hiatal hernia   . Left bundle branch block   . A-fib     Past Surgical History  Procedure Laterality Date  . Cholecystectomy    . Cataract extraction w/ intraocular lens  implant, bilateral      Hx; of  . Tonsillectomy    . Abdominal hysterectomy    . Dilation and curettage of uterus      No prescriptions prior to admission   No Known Allergies  History  Substance Use Topics  . Smoking status: Passive Smoke Exposure - Never Smoker  . Smokeless tobacco: Never Used  . Alcohol Use: No    Family History  Problem Relation Age of Onset  . Heart disease Mother   . Arthritis Mother      Review of Systems A comprehensive review of systems was negative.  Objective: Vital signs in last 24 hours:    EXAM: Patient well-developed well-nourished white female in no acute distress. Lungs are clear to auscultation , the patient has symmetrical respiratory excursion. Heart has a regular rate and rhythm normal S1 and S2 no murmur.   Abdomen is soft nontender nondistended bowel sounds are present. Extremity examination shows no clubbing cyanosis or edema. Musculoskeletal examination shows no tenderness to palpation  over the lumbar spinous processes or paralumbar musculature. He had a flexed but I degrees and able to extend to about 10. Straight leg raising is negative bilaterally. Neurologic examination shows weakness in the distal left lower extremity. The iliopsoas and quadriceps are 5 bilaterally, and the right dorsiflexor, EHL, and plantar flexor are 5. However the left dorsiflexor is 4+, a left excisional soreness is to 3, and the left plantar flexor is 5. Sensation is intact to pinprick in the distal lower extremities. Reflexes  are one at the quadriceps, and absent at the gastrocnemius. They're symmetrical by. Toes are downgoing bilaterally. He is a normal gait and stance.  Data Review:CBC    Component Value Date/Time   WBC 9.4 05/05/2013 1507   RBC 4.34 05/05/2013 1507   HGB 13.8 05/05/2013 1507   HCT 41.6 05/05/2013 1507   PLT 173 05/05/2013 1507   MCV 95.9 05/05/2013 1507   MCH 31.8 05/05/2013 1507   MCHC 33.2 05/05/2013 1507   RDW 14.1 05/05/2013 1507   LYMPHSABS 1.5 10/11/2011 0820   MONOABS 0.9 10/11/2011 0820   EOSABS 0.0 10/11/2011 0820   BASOSABS 0.1 10/11/2011 0820                          BMET    Component Value Date/Time   NA 142 05/05/2013 1507   K 4.3 05/05/2013 1507   CL 106 05/05/2013 1507   CO2 25 05/05/2013 1507   GLUCOSE 111* 05/05/2013 1507   BUN 32* 05/05/2013 1507   CREATININE 1.38* 05/05/2013 1507   CREATININE 1.67* 04/27/2013 1659   CALCIUM 10.0 05/05/2013 1507   GFRNONAA 35* 05/05/2013 1507   GFRAA 40* 05/05/2013 1507     Assessment/Plan: Was admitted for treatment of right left L4-5 lumbar disc herniation with a resulting left lumbar radiculopathy with pain and weakness. She doesn't multilevel multifactorial lumbar stenosis secondary to lumbar degenerative disease and spondylosis, but is symptomatic from the radiculopathy due to the disc herniation.  I've discussed with the patient the nature of his condition, the nature the surgical procedure, the typical length of surgery, hospital stay, and overall recuperation. We discussed limitations postoperatively. I discussed risks of surgery including risks of infection, bleeding, possibly need for transfusion, the risk of nerve root dysfunction with pain, weakness, numbness, or paresthesias, or risk of dural tear and CSF leakage and possible need for further surgery, the risk of recurrent disc herniation and the possible need for further surgery, and the risk of anesthetic complications including myocardial infarction, stroke, pneumonia, and death.  Understanding all this the patient does wish to proceed with surgery and is admitted for such.    Hewitt Shorts, MD 05/12/2013 7:37 AM

## 2013-05-12 NOTE — Transfer of Care (Signed)
Immediate Anesthesia Transfer of Care Note  Patient: [name]  Procedure(s) Performed: Procedure(s) with comments: Left Lumbar four-five laminotomy and microdiskectomy (Left) - Left Lumbar four-five laminotomy and microdiskectomy  Patient Location: PACU  Anesthesia Type:General  Level of Consciousness: awake, alert , patient cooperative and confused  Airway & Oxygen Therapy: Patient Spontanous Breathing and Patient connected to nasal cannula oxygen  Post-op Assessment: Report given to PACU RN, Post -op Vital signs reviewed and stable and Patient moving all extremities  Post vital signs: Reviewed and stable  Complications: No apparent anesthesia complications

## 2013-05-12 NOTE — Anesthesia Postprocedure Evaluation (Signed)
  Anesthesia Post-op Note  Patient: [name]  Procedure(s) Performed: Procedure(s) with comments: Left Lumbar four-five laminotomy and microdiskectomy (Left) - Left Lumbar four-five laminotomy and microdiskectomy  Patient Location: PACU  Anesthesia Type:General  Level of Consciousness: awake  Airway and Oxygen Therapy: Patient Spontanous Breathing  Post-op Pain: mild  Post-op Assessment: Post-op Vital signs reviewed  Post-op Vital Signs: stable  Complications: No apparent anesthesia complications

## 2013-05-12 NOTE — Anesthesia Preprocedure Evaluation (Signed)
Anesthesia Evaluation  Patient identified by MRN, date of birth, ID band Patient awake    Reviewed: Allergy & Precautions, H&P , Patient's Chart, lab work & pertinent test results  Airway Mallampati: II      Dental  (+) Poor Dentition and Missing   Pulmonary shortness of breath, asthma , COPD breath sounds clear to auscultation        Cardiovascular hypertension, +CHF + dysrhythmias Atrial Fibrillation Rhythm:Irregular Rate:Normal     Neuro/Psych  Neuromuscular disease    GI/Hepatic hiatal hernia, GERD-  ,  Endo/Other  diabetes  Renal/GU Renal InsufficiencyRenal disease     Musculoskeletal   Abdominal   Peds  Hematology   Anesthesia Other Findings   Reproductive/Obstetrics                           Anesthesia Physical Anesthesia Plan  ASA: III  Anesthesia Plan: General   Post-op Pain Management:    Induction: Intravenous  Airway Management Planned: Oral ETT  Additional Equipment:   Intra-op Plan:   Post-operative Plan: Extubation in OR  Informed Consent: I have reviewed the patients History and Physical, chart, labs and discussed the procedure including the risks, benefits and alternatives for the proposed anesthesia with the patient or authorized representative who has indicated his/her understanding and acceptance.   Dental advisory given  Plan Discussed with: CRNA and Surgeon  Anesthesia Plan Comments:         Anesthesia Quick Evaluation

## 2013-05-12 NOTE — Progress Notes (Signed)
Notified Heather in neuro OR of patient's PT/ INR  Still pending.

## 2013-05-12 NOTE — Progress Notes (Signed)
Filed Vitals:   05/12/13 1600 05/12/13 1602 05/12/13 1615 05/12/13 1640  BP:  106/39  114/67  Pulse: 62 59 68 65  Temp: 97 F (36.1 C)  97 F (36.1 C) 97.6 F (36.4 C)  Resp: 16 18 14 16   SpO2: 100% 99% 96% 94%    Patient up and ambulating in the halls. Comfortable. Dressing clean dry. Moving all 4 extremity is well. Has voided.  Plan: Continued to progress through postoperative recovery.  Hewitt Shorts, MD 05/12/2013, 5:06 PM

## 2013-05-12 NOTE — Progress Notes (Signed)
Orthopedic Tech Progress Note Patient Details:  [name] September 07, 1927 [id number] Brace delivered by Irven Baltimore Patient ID: [name], female   DOB: Nov 12, 1927, 79 y.o.   MRN: [id number]   Jennye Moccasin 05/12/2013, 5:23 PM

## 2013-05-12 NOTE — Op Note (Signed)
05/12/2013  2:31 PM  PATIENT:  [name]  79 y.o. female  PRE-OPERATIVE DIAGNOSIS:  lumbar herniated disc  POST-OPERATIVE DIAGNOSIS:  lumbar herniated disc  PROCEDURE:  Procedure(s):  Left L5 hemi-laminectomy, inferior left L4 hemilaminotomy, superior left S1 hemilaminotomy, and left L4-5 microdiscectomy with microdissection, microsurgical technique, and the operating microscope  SURGEON:  Surgeon(s): Hewitt Shorts, MD Reinaldo Meeker, MD  ASSISTANTS: Aliene Beams, M.D.  ANESTHESIA:   general  EBL:    50 cc  BLOOD ADMINISTERED:none  COUNT: Correct per nursing staff  DICTATION: Patient was brought to the operating room and placed under general endotracheal anesthesia. Patient was turned to prone position the lumbar region was prepped with Betadine soap and solution and draped in a sterile fashion. The midline was infiltrated with local anesthetic with epinephrine. A localizing x-ray was taken and the L5 level was identified. Midline incision was made over the L5 level and was carried down through the subcutaneous tissue to the lumbar fascia. The lumbar fascia was incised on the left side and the paraspinal muscles were dissected from the spinous processes and lamina in a subperiosteal fashion. Another x-ray was taken and the L4-5 and L5-S1 intralaminar spaces were identified. The operating microscope was draped and brought into the field provided additional magnification, illumination, and visualization. Left L5 hemilaminectomy, inferior left L4 hemilaminotomy, and superior left S1 hemilaminotomy were performed using the high-speed drill and Kerrison punches. The ligamentum flavum was carefully resected. The underlying thecal sac and left L5 nerve root were identified. The disc herniation was identified, there was a free fragment behind the body of L5, extending into the left L5 nerve roots neural foramen, significantly compressing the exiting left L5 nerve root. This free fragment  disc herniation was removed in a piecemeal fashion. All loose fragments were removed from the epidural space, and good decompression of the thecal sac and exiting left L5 nerve root were achieved. We did not enter into the disc space, but rather slowly removed a free fragment. Once the discectomy was completed and good decompression of the thecal sac and nerve had been achieved hemostasis was established with the use of bipolar cautery and Gelfoam with thrombin. The Gelfoam was removed and hemostasis confirmed. We then instilled 2 cc of fentanyl and 80 mg of Depo-Medrol into the epidural space. Deep fascia was closed with interrupted undyed 1 Vicryl sutures. Scarpa's fascia was closed with interrupted undyed 2-0 Vicryl sutures in the subcutaneous and subcuticular layer were closed with interrupted inverted 2-0 undyed Vicryl sutures. The skin edges were approximated with Dermabond. A dressing of sterile gauze and Hypafix was applied. Following surgery the patient was turned back to a supine position to be reversed from the anesthetic extubated and transferred to the recovery room for further care.   PLAN OF CARE: Admit to inpatient   PATIENT DISPOSITION:  PACU - hemodynamically stable.   Delay start of Pharmacological VTE agent (>24hrs) due to surgical blood loss or risk of bleeding:  yes

## 2013-05-13 ENCOUNTER — Telehealth: Payer: Self-pay | Admitting: Family Medicine

## 2013-05-13 LAB — GLUCOSE, CAPILLARY
Glucose-Capillary: 94 mg/dL (ref 70–99)
Glucose-Capillary: 169 mg/dL — ABNORMAL HIGH (ref 70–99)

## 2013-05-13 MED ORDER — HYDROCODONE-ACETAMINOPHEN 5-325 MG PO TABS: 1.0000 | ORAL_TABLET | ORAL | Status: DC | PRN

## 2013-05-13 MED ORDER — TRAMADOL HCL 50 MG PO TABS: 50.0000 mg | ORAL_TABLET | Freq: Four times a day (QID) | ORAL | Status: DC | PRN

## 2013-05-13 NOTE — Progress Notes (Signed)
Pt and husband given D/C instructions with Rx's, verbal understanding of teaching was given. Pt D/C'd home via wheelchair @ 1220 per MD order. Pt stable @ D/C and there were no other needs @ this time. Rema Fendt, RN

## 2013-05-13 NOTE — Telephone Encounter (Signed)
Called to see how the surgery was. Patient states she feels fine and that she does need to wear a brace. No questions or concerns at this time.

## 2013-05-13 NOTE — Discharge Summary (Signed)
Physician Discharge Summary  Patient ID: [name] MRN: [id number] DOB/AGE: 77-Apr-1929 77 y.o.  Admit date: 05/12/2013 Discharge date: 05/13/2013  Admission Diagnoses:  Left L4-5 lumbar disc herniation, lumbar degenerative disease, lumbar spondylosis, lumbar stenosis, lumbar radiculopathy  Discharge Diagnoses:  Left L4-5 lumbar disc herniation, lumbar degenerative disease, lumbar spondylosis, lumbar stenosis, lumbar radiculopathy  Discharged Condition: good  Hospital Course: Patient was admitted underwent a left L5 hemilaminectomy, and left L4-5 microdiscectomy. She is done well following surgery, with good relief of her radicular pain. She is up and living actively in the halls. The wound is healing well. She is asking to be discharged to home. She has been given instructions regarding wound care and activities following discharge. She is to return for followup with me in 3 weeks. Instructed to resume Coumadin on Monday, November 17.  Discharge Exam: Blood pressure 129/68, pulse 85, temperature 98.7 F (37.1 C), temperature source Oral, resp. rate 18, SpO2 92.00%.  Disposition: Home     Medication List         albuterol 108 (90 BASE) MCG/ACT inhaler  Commonly known as:  PROVENTIL HFA;VENTOLIN HFA  Inhale 2 puffs into the lungs every 6 (six) hours as needed for wheezing or shortness of breath.     albuterol (2.5 MG/3ML) 0.083% nebulizer solution  Commonly known as:  PROVENTIL  Take 2.5 mg by nebulization every 6 (six) hours as needed for wheezing or shortness of breath.     amLODipine 10 MG tablet  Commonly known as:  NORVASC  Take 1 tablet by mouth  daily     aspirin EC 81 MG tablet  Take 81 mg by mouth daily.     atorvastatin 80 MG tablet  Commonly known as:  LIPITOR  Take 80 mg by mouth at bedtime.     CALCIUM 600+D 600-200 MG-UNIT Tabs  Generic drug:  Calcium Carbonate-Vitamin D  Take 1 tablet by mouth daily.     carvedilol 6.25 MG tablet  Commonly known as:   COREG  Take 1 tablet by mouth  twice a day with meals     CINNAMON PLUS CHROMIUM PO  Take 1 capsule by mouth 2 (two) times daily. 56mcg/500mg      docusate sodium 100 MG capsule  Commonly known as:  COLACE  Take 200 mg by mouth 2 (two) times daily.     Fish Oil 1000 MG Caps  Take 1 capsule by mouth 2 (two) times daily.     Flax Seed Oil 1000 MG Caps  Take 1,000 mg by mouth daily.     furosemide 20 MG tablet  Commonly known as:  LASIX  Take 1 tablet (20 mg total) by mouth daily.     glipiZIDE 2.5 MG 24 hr tablet  Commonly known as:  GLUCOTROL XL  Take 1 tablet (2.5 mg total) by mouth daily.     HYDROcodone-acetaminophen 5-325 MG per tablet  Commonly known as:  NORCO/VICODIN  Take 1 tablet by mouth every 4 (four) hours as needed for moderate pain or severe pain.     losartan 50 MG tablet  Commonly known as:  COZAAR  Take 1 tablet (50 mg total) by mouth daily.     omeprazole 20 MG capsule  Commonly known as:  PRILOSEC  Take 20 mg by mouth daily.     REFRESH OP  Place 1 drop into both eyes 3 (three) times daily as needed (dry eyes).     traMADol 50 MG tablet  Commonly known as:  ULTRAM  Take 1-2 tablets (50-100 mg total) by mouth every 6 (six) hours as needed for moderate pain.     traMADol 50 MG tablet  Commonly known as:  ULTRAM  Take 100 mg by mouth every 6 (six) hours as needed for moderate pain.     vitamin C 500 MG tablet  Commonly known as:  ASCORBIC ACID  Take 500 mg by mouth 2 (two) times daily.     warfarin 5 MG tablet  Commonly known as:  COUMADIN  Take 1 tablet by mouth daily or as directed         Signed: Hewitt Shorts, MD 05/13/2013, 11:50 AM

## 2013-05-16 ENCOUNTER — Encounter (HOSPITAL_COMMUNITY): Payer: Self-pay | Admitting: Neurosurgery

## 2013-05-16 LAB — GLUCOSE, CAPILLARY: Glucose-Capillary: 107 mg/dL — ABNORMAL HIGH (ref 70–99)

## 2013-05-23 ENCOUNTER — Telehealth: Payer: Self-pay | Admitting: Internal Medicine

## 2013-05-23 ENCOUNTER — Telehealth: Payer: Self-pay | Admitting: Family Medicine

## 2013-05-23 DIAGNOSIS — E1140 Type 2 diabetes mellitus with diabetic neuropathy, unspecified: Secondary | ICD-10-CM

## 2013-05-23 NOTE — Telephone Encounter (Signed)
Pt returned phone call.  

## 2013-05-23 NOTE — Telephone Encounter (Signed)
Called pt. Pt request medications for her pain in her feet. She reports, that she just was d/c from the hospital and she does not want to have an OV right now. She reports that Dr.Williamson visited her in the hospital, but she was not there. He knows about her problems and she would like for Dr.W. To send some medications to her pharmacy please. Fwd.  [name], [name]

## 2013-05-23 NOTE — Telephone Encounter (Signed)
Pt would like a nurse to call her about her feet. She has diabetes and some medicines she can not take. She is having pains in her feet that shoot up through her legs. jw

## 2013-05-23 NOTE — Telephone Encounter (Signed)
Wants to know when she needs to come in.  Has had surgery.  Please call

## 2013-05-24 ENCOUNTER — Ambulatory Visit: Payer: PRIVATE HEALTH INSURANCE | Admitting: Family Medicine

## 2013-05-24 NOTE — Telephone Encounter (Signed)
Pt restarted warfarin post surgery on 11/17.  Has appt scheduled for tomorrow Wed Nov 26.

## 2013-05-25 ENCOUNTER — Ambulatory Visit: Payer: PRIVATE HEALTH INSURANCE | Admitting: Pharmacist Clinician (PhC)/ Clinical Pharmacy Specialist

## 2013-05-25 MED ORDER — GABAPENTIN 300 MG PO CAPS: 300.0000 mg | ORAL_CAPSULE | Freq: Every day | ORAL | Status: DC

## 2013-05-25 NOTE — Telephone Encounter (Signed)
I spoke with the patient about her concern for peripheral neuropathy pain. We decided that she could take gabapentin 300 mg daily based on her creatinine clearance. She is agreeable to this plan.

## 2013-05-31 ENCOUNTER — Ambulatory Visit: Payer: PRIVATE HEALTH INSURANCE | Admitting: Pharmacist Clinician (PhC)/ Clinical Pharmacy Specialist

## 2013-06-02 ENCOUNTER — Ambulatory Visit (INDEPENDENT_AMBULATORY_CARE_PROVIDER_SITE_OTHER): Payer: PRIVATE HEALTH INSURANCE | Admitting: Pharmacist Clinician (PhC)/ Clinical Pharmacy Specialist

## 2013-06-02 VITALS — BP 130/58 | HR 64

## 2013-06-02 DIAGNOSIS — I4891 Unspecified atrial fibrillation: Secondary | ICD-10-CM

## 2013-06-02 DIAGNOSIS — Z7901 Long term (current) use of anticoagulants: Secondary | ICD-10-CM

## 2013-06-02 LAB — POCT INR: INR: 2.2

## 2013-06-03 ENCOUNTER — Telehealth: Payer: Self-pay | Admitting: Family Medicine

## 2013-06-03 NOTE — Telephone Encounter (Signed)
Pt called because she said that some days she has pig feet. She said it is hard to walk at times. jw

## 2013-06-03 NOTE — Telephone Encounter (Signed)
Will fwd. To PCP for review .[name]  

## 2013-06-06 NOTE — Telephone Encounter (Signed)
Called pt and instructed pt needs OV to discuss her swelling.  Pt verbalized understanding.  [name], Darlyne Russian, CMA

## 2013-06-06 NOTE — Telephone Encounter (Signed)
Please ask the pt to come in for evaluation.

## 2013-06-09 ENCOUNTER — Ambulatory Visit: Payer: PRIVATE HEALTH INSURANCE | Admitting: Family Medicine

## 2013-06-14 ENCOUNTER — Ambulatory Visit: Payer: PRIVATE HEALTH INSURANCE | Admitting: Family Medicine

## 2013-06-22 ENCOUNTER — Encounter: Payer: Self-pay | Admitting: Family Medicine

## 2013-06-22 ENCOUNTER — Ambulatory Visit (INDEPENDENT_AMBULATORY_CARE_PROVIDER_SITE_OTHER): Payer: PRIVATE HEALTH INSURANCE | Admitting: Family Medicine

## 2013-06-22 VITALS — BP 106/64 | HR 60 | Temp 97.7°F | Ht 67.0 in | Wt 161.0 lb

## 2013-06-22 DIAGNOSIS — R609 Edema, unspecified: Secondary | ICD-10-CM

## 2013-06-22 DIAGNOSIS — R600 Localized edema: Secondary | ICD-10-CM

## 2013-06-22 NOTE — Progress Notes (Signed)
   Subjective:    Patient ID: [name], female    DOB: Jul 09, 1927, 79 y.o.   MRN: [id number]  HPI  79 year old F with DM, CKF, CHF with preserved EF who presents with swelling of the feet. Has history venous insufficiency and wears hose daily. No abdominal pain or fullness. No history of hysterectomy or ovarian/uterine cancer. Albumin last checked in October and was normal. Last echo in April 2013 showed EF of 60-65%. Pt has been proper her feet daily and denies any swelling at this point. No pain or tenderness in calves.      Review of Systems See HPI    Objective:   Physical Exam  BP 106/64  Pulse 60  Temp(Src) 97.7 F (36.5 C) (Oral)  Ht 5\' 7"  (1.702 m)  Wt 161 lb (73.029 kg)  BMI 25.21 kg/m2  Gen: female, nondistressed, pleasant and conversant CV: irregulary irregular  Pulm: No crackles  Extr: 1+ pitting edema      Assessment & Plan:  Is likely dependent edema due to position and venous insufficiency. No concern for CHF exacerbation, DVT, pelvic mass, or hypoalbuminemia. Continue to follow.

## 2013-06-22 NOTE — Patient Instructions (Signed)
[name],   It was nice great to see you and Mr. Tippens today. I am glad that you swelling improved. If it comes back, please let me know and I will order the echocardiogram of your heart. For your swelling, continue the legs stockings.   Y'all have a merry christmas! Please come back in 1-2 months for the swelling.   Dr. Clinton Sawyer

## 2013-06-29 ENCOUNTER — Telehealth: Payer: Self-pay | Admitting: Pharmacist Clinician (PhC)/ Clinical Pharmacy Specialist

## 2013-06-29 NOTE — Telephone Encounter (Signed)
Forgot warfarin dose last night,  Advised to take 1.5 tablets today then resume 1 tablet daily,  Pt voiced understanding.

## 2013-07-01 ENCOUNTER — Ambulatory Visit: Payer: PRIVATE HEALTH INSURANCE | Admitting: Pharmacist Clinician (PhC)/ Clinical Pharmacy Specialist

## 2013-07-05 ENCOUNTER — Ambulatory Visit: Payer: PRIVATE HEALTH INSURANCE | Admitting: Pharmacist Clinician (PhC)/ Clinical Pharmacy Specialist

## 2013-07-14 ENCOUNTER — Ambulatory Visit (INDEPENDENT_AMBULATORY_CARE_PROVIDER_SITE_OTHER): Payer: PRIVATE HEALTH INSURANCE | Admitting: Pharmacist Clinician (PhC)/ Clinical Pharmacy Specialist

## 2013-07-14 VITALS — BP 120/50 | HR 64

## 2013-07-14 DIAGNOSIS — I4891 Unspecified atrial fibrillation: Secondary | ICD-10-CM

## 2013-07-14 DIAGNOSIS — Z7901 Long term (current) use of anticoagulants: Secondary | ICD-10-CM

## 2013-07-14 LAB — POCT INR: INR: 1.9

## 2013-07-15 ENCOUNTER — Telehealth: Payer: Self-pay | Admitting: Family Medicine

## 2013-07-15 NOTE — Telephone Encounter (Signed)
Pt called and would like her prescription on her gabapentin changed to 90 qty because it is cheaper. When she get 30 qty it is 120.00 if she gets 90 qty it is 120.00. She is out so can we change this today. jw

## 2013-07-15 NOTE — Telephone Encounter (Signed)
Per Dr. Maricela Bo, I called pharmacy and changed Rx to Gabapentin 300 mg one by mouth once a day, disp #90 with one refill.  Pt notified.  Chrissa Meetze, Loralyn Fresh[place], [place]

## 2013-08-10 ENCOUNTER — Ambulatory Visit: Payer: PRIVATE HEALTH INSURANCE | Admitting: Family Medicine

## 2013-08-11 ENCOUNTER — Ambulatory Visit: Payer: PRIVATE HEALTH INSURANCE | Admitting: Pharmacist Clinician (PhC)/ Clinical Pharmacy Specialist

## 2013-08-11 ENCOUNTER — Ambulatory Visit (INDEPENDENT_AMBULATORY_CARE_PROVIDER_SITE_OTHER): Payer: PRIVATE HEALTH INSURANCE | Admitting: Pharmacist Clinician (PhC)/ Clinical Pharmacy Specialist

## 2013-08-11 VITALS — BP 140/60 | HR 68

## 2013-08-11 DIAGNOSIS — I4891 Unspecified atrial fibrillation: Secondary | ICD-10-CM

## 2013-08-11 DIAGNOSIS — Z7901 Long term (current) use of anticoagulants: Secondary | ICD-10-CM

## 2013-08-11 LAB — POCT INR: INR: 1.9

## 2013-08-19 ENCOUNTER — Ambulatory Visit: Payer: PRIVATE HEALTH INSURANCE | Admitting: Pharmacist Clinician (PhC)/ Clinical Pharmacy Specialist

## 2013-08-26 ENCOUNTER — Ambulatory Visit (INDEPENDENT_AMBULATORY_CARE_PROVIDER_SITE_OTHER): Payer: PRIVATE HEALTH INSURANCE | Admitting: Family Medicine

## 2013-08-26 VITALS — BP 128/44 | HR 80 | Temp 98.2°F | Wt 162.0 lb

## 2013-08-26 DIAGNOSIS — E119 Type 2 diabetes mellitus without complications: Secondary | ICD-10-CM

## 2013-08-26 LAB — POCT GLYCOSYLATED HEMOGLOBIN (HGB A1C): Hemoglobin A1C: 6.2

## 2013-08-26 NOTE — Patient Instructions (Signed)
Ms. Flavell,   I love seeing you today. Thanks for coming to the office to see me. Please take the glipizide only if your sugar is > 200. Your A1c today is 6.2.   Come back in 3 months or sooner if needed. Mr. Labuda can come back in April.   Sincerely,   Dr. Maricela Bo

## 2013-08-26 NOTE — Progress Notes (Signed)
Patient ID: [name], female   DOB: 1927/12/06, 80 y.o.   MRN: [id number]  Subjective:   Diabetes  Recent Issues:  Hypoglycemia: No   Medication Compliance: Diabetes medication: Yes - taking glipizide 2.5 mg 2-3 times in last 3 mos for CBG > 150 Taking ACE-I: Yes Taking statin: Yes  Behavioral: Home CBG Monitoring: Yes, CBG 136  Diet changes: Yes Exercise: No  Health Maintenance: Foot ulcers: No    Review of Systems:  Constitutional: negative for anorexia, fatigue and fevers Respiratory: negative for dyspnea on exertion Cardiovascular: negative for chest pain and exertional chest pressure/discomfort     Objective:   Physical Exam: There were no vitals taken for this visit.  General: alert, well appearing, and in no distress, oriented to person, place, and time and normal appearing weight  Eyes: deferred  Cardiovascular: irregular rhythm, regular rate  Pulmonary: clear to auscultation bilaterally   Labs:   Diabetic Labs:  Lab Results  Component Value Date   HGBA1C 6.2 08/26/2013   HGBA1C 7.1 04/27/2013   HGBA1C 6.5 11/11/2012   Lab Results  Component Value Date   LDLCALC 66 01/19/2013   CREATININE 1.38* 05/05/2013   Last microalbumin: No results found for this basename: MICROALBUR,  [id number]        Assessment & Plan:

## 2013-08-27 NOTE — Assessment & Plan Note (Addendum)
A: well controlled P: only take glipizide 2.5 mg PO if CBG < 200

## 2013-09-07 ENCOUNTER — Ambulatory Visit (INDEPENDENT_AMBULATORY_CARE_PROVIDER_SITE_OTHER): Payer: PRIVATE HEALTH INSURANCE | Admitting: Pharmacist Clinician (PhC)/ Clinical Pharmacy Specialist

## 2013-09-07 DIAGNOSIS — I4891 Unspecified atrial fibrillation: Secondary | ICD-10-CM

## 2013-09-07 DIAGNOSIS — Z7901 Long term (current) use of anticoagulants: Secondary | ICD-10-CM

## 2013-09-07 LAB — POCT INR: INR: 3

## 2013-09-28 ENCOUNTER — Encounter: Payer: Self-pay | Admitting: *Deleted

## 2013-10-04 ENCOUNTER — Ambulatory Visit: Payer: PRIVATE HEALTH INSURANCE | Admitting: Internal Medicine

## 2013-10-05 ENCOUNTER — Encounter: Payer: Self-pay | Admitting: Internal Medicine

## 2013-10-05 ENCOUNTER — Ambulatory Visit (INDEPENDENT_AMBULATORY_CARE_PROVIDER_SITE_OTHER): Payer: PRIVATE HEALTH INSURANCE | Admitting: Pharmacist Clinician (PhC)/ Clinical Pharmacy Specialist

## 2013-10-05 ENCOUNTER — Ambulatory Visit (INDEPENDENT_AMBULATORY_CARE_PROVIDER_SITE_OTHER): Payer: PRIVATE HEALTH INSURANCE | Admitting: Internal Medicine

## 2013-10-05 VITALS — BP 90/52 | HR 75 | Ht 67.0 in | Wt 168.0 lb

## 2013-10-05 DIAGNOSIS — Z7901 Long term (current) use of anticoagulants: Secondary | ICD-10-CM

## 2013-10-05 DIAGNOSIS — I4891 Unspecified atrial fibrillation: Secondary | ICD-10-CM

## 2013-10-05 DIAGNOSIS — N183 Chronic kidney disease, stage 3 unspecified: Secondary | ICD-10-CM

## 2013-10-05 DIAGNOSIS — I10 Essential (primary) hypertension: Secondary | ICD-10-CM

## 2013-10-05 DIAGNOSIS — E785 Hyperlipidemia, unspecified: Secondary | ICD-10-CM

## 2013-10-05 LAB — POCT INR: INR: 3

## 2013-10-05 MED ORDER — AMLODIPINE BESYLATE 5 MG PO TABS
ORAL_TABLET | ORAL | Status: DC
Start: 2013-10-05 — End: 2015-04-26

## 2013-10-05 NOTE — Progress Notes (Signed)
OFFICE NOTE  Chief Complaint:  Fatigue  Primary Care Physician: Dewain Penning, MD  HPI:  [name] is an 80 year old female with history of AFib, left bundle branch block. She recently had an echo in our office in January 2013 which showed an of 40% to 45%. Subsequently, her medications were adjusted. She ended up being hospitalized, and in-hospital echo showed an improvement in the EF up to 60% to 65%. She has also had some improvement in her serum creatinine, which has come down, but does have stage III chronic kidney disease (however, she reports her creatinine has improved to 1.2 recently). Her AFib rate has been well controlled at 73. Overall, she is asymptomatic. Denies any heart failure symptoms, chest pain, palpitations, presyncope, or syncopal symptoms.  She does report some fatigue recently and her blood pressure is noted to be low today.  He has some occasional lower extremity swelling worse on the left than the right leg.  PMHx:  Past Medical History  Diagnosis Date  . Anemia     anemia of renal disease  . Diabetes mellitus   . Hypertension   . Chronic kidney disease     Stage III- followed by [facility]  . CHF (congestive heart failure)     Diastolic dys-- EF 10% july 2011  . Hyperlipidemia   . Arthritis     mutiple joints  . Cancer     Renal neoplasm- survellience via Urology  . Peripheral neuropathy   . COPD (chronic obstructive pulmonary disease)   . DIASTOLIC DYSFUNCTION 2/72/5366    Qualifier: Diagnosis of  By: Buelah Manis MD, Lonell Grandchild    . NEOPLASM, KIDNEY 03/04/2007    complex right renal cyst; Per patient- Urologist no longer following- no changes.      . Venous insufficiency 02/24/2011  . Shortness of breath     Hx: of with exertion  . GERD (gastroesophageal reflux disease)   . H/O hiatal hernia   . Left bundle branch block   . A-fib   . Pneumonia   . Nonischemic cardiomyopathy     Past Surgical History  Procedure Laterality Date  .  Cholecystectomy  1987  . Cataract extraction w/ intraocular lens  implant, bilateral    . Tonsillectomy    . Abdominal hysterectomy    . Dilation and curettage of uterus    . Lumbar laminectomy/decompression microdiscectomy Left 05/12/2013    Procedure: Left Lumbar four-five laminotomy and microdiskectomy;  Surgeon: Hosie Spangle, MD;  Location: [facility];  Service: Neurosurgery;  Laterality: Left;  Left Lumbar four-five laminotomy and microdiskectomy  . Transthoracic echocardiogram  09/2011    EF 60-65%; MV mod-severely calcified, mildly thickened MV annulus, mild MR; LA mod dilated; RA mildly dilated  . Nm myocar perf wall motion  05/2011    lexiscan myoview - fixed septal defect r/t LBBB; normal pattern of perfusion in all regions; low risk scan  . Cardiac catheterization  08/12/2011    heavily calcified L main; LAD with no stenosis; 1st diagonal free of stenosis; L Cfx with no stenosis; OM1/2 without stenosis; PDA without stenosis; RCA without significant stenosis; non-ischemic cardiomyopathy (Dr. Kathy Breach)    FAMHx:  Family History  Problem Relation Age of Onset  . Heart disease Mother   . Arthritis Mother     SOCHx:   reports that she has been passively smoking.  She has never used smokeless tobacco. She reports that she does not drink alcohol or use illicit drugs.  ALLERGIES:  No Known Allergies  ROS: A comprehensive review of systems was negative except for: Cardiovascular: positive for dyspnea, fatigue and lower extremity edema  HOME MEDS: Current Outpatient Prescriptions  Medication Sig Dispense Refill  . albuterol (PROVENTIL HFA;VENTOLIN HFA) 108 (90 BASE) MCG/ACT inhaler Inhale 2 puffs into the lungs every 6 (six) hours as needed for wheezing or shortness of breath.  3 Inhaler  1  . albuterol (PROVENTIL) (2.5 MG/3ML) 0.083% nebulizer solution Take 2.5 mg by nebulization every 6 (six) hours as needed for wheezing or shortness of breath.      Marland Kitchen amLODipine  (NORVASC) 5 MG tablet Take 1 tablet by mouth  daily  90 tablet  3  . aspirin EC 81 MG tablet Take 81 mg by mouth daily.      Marland Kitchen atorvastatin (LIPITOR) 80 MG tablet Take 80 mg by mouth at bedtime.      . Calcium Carbonate-Vitamin D (CALCIUM 600+D) 600-200 MG-UNIT TABS Take 1 tablet by mouth daily.       . carvedilol (COREG) 6.25 MG tablet Take 1 tablet by mouth  twice a day with meals  180 tablet  3  . Chromium-Cinnamon (CINNAMON PLUS CHROMIUM PO) Take 1 capsule by mouth 2 (two) times daily. 88mcg/500mg       . CINNAMON PO Take by mouth daily.      Marland Kitchen docusate sodium (COLACE) 100 MG capsule Take 200 mg by mouth 2 (two) times daily.      . furosemide (LASIX) 20 MG tablet Take 1 tablet (20 mg total) by mouth daily.  90 tablet  1  . gabapentin (NEURONTIN) 300 MG capsule Take 1 capsule (300 mg total) by mouth daily.  30 capsule  5  . losartan (COZAAR) 50 MG tablet Take 1 tablet (50 mg total) by mouth daily.  90 tablet  1  . Omega-3 Fatty Acids (FISH OIL) 1000 MG CAPS Take 1 capsule by mouth 2 (two) times daily.       Marland Kitchen omeprazole (PRILOSEC) 20 MG capsule Take 20 mg by mouth daily.      . Polyvinyl Alcohol-Povidone (REFRESH OP) Place 1 drop into both eyes 3 (three) times daily as needed (dry eyes).      . vitamin C (ASCORBIC ACID) 500 MG tablet Take 500 mg by mouth 2 (two) times daily.      Marland Kitchen warfarin (COUMADIN) 5 MG tablet Take 1 tablet by mouth daily or as directed  90 tablet  1   No current facility-administered medications for this visit.   Facility-Administered Medications Ordered in Other Visits  Medication Dose Route Frequency Provider Last Rate Last Dose  . 0.9 %  sodium chloride infusion   Intravenous Continuous Pixie Casino, MD      . sodium chloride 0.9 % injection 3 mL  3 mL Intravenous Q12H Pixie Casino, MD        LABS/IMAGING: Results for orders placed in visit on 10/05/13 (from the past 48 hour(s))  POCT INR     Status: None   Collection Time    10/05/13  3:41 PM       Result Value Ref Range   INR 3     No results found.  VITALS: BP 90/52  Pulse 75  Ht 5\' 7"  (1.702 m)  Wt 168 lb (76.204 kg)  BMI 26.31 kg/m2  EXAM: General appearance: alert and no distress Neck: no carotid bruit and no JVD Lungs: clear to auscultation bilaterally Heart: irregularly irregular rhythm Abdomen: soft,  non-tender; bowel sounds normal; no masses,  no organomegaly Extremities: edema trace LLE edema Pulses: 2+ and symmetric Skin: Skin color, texture, turgor normal. No rashes or lesions Neurologic: Grossly normal Psych: Mood, affect normal  EKG: Atrial fibrillation at 75  ASSESSMENT: 1. Fatigue 2. Hypotension 3. Persistent if not permanent atrial fibrillation 4. Left bundle-branch block  PLAN: 1.   Mrs. Corbit is doing well except she is somewhat fatigued. Her blood pressure is low today. I recommend decreasing her amlodipine to 5 mg daily which should also help with her lower extremity swelling. Plan to see her back annually or sooner if necessary.  Pixie Casino, MD, [facility] Attending Cardiologist [place] 10/05/2013, 5:38 PM

## 2013-10-05 NOTE — Patient Instructions (Signed)
Your physician has recommended you make the following change in your medication: DECREASE amlodipine to 5mg  once daily.   Your physician wants you to follow-up in: 1 year. You will receive a reminder letter in the mail two months in advance. If you don't receive a letter, please call our office to schedule the follow-up appointment.

## 2013-10-06 ENCOUNTER — Other Ambulatory Visit: Payer: Self-pay | Admitting: Internal Medicine

## 2013-10-06 ENCOUNTER — Other Ambulatory Visit: Payer: Self-pay | Admitting: *Deleted

## 2013-10-06 MED ORDER — ATORVASTATIN CALCIUM 80 MG PO TABS: 80.0000 mg | ORAL_TABLET | Freq: Every day | ORAL | Status: DC

## 2013-10-07 NOTE — Addendum Note (Signed)
Addended by: Fidel Levy on: 10/07/2013 02:17 PM   Modules accepted: Orders

## 2013-10-10 ENCOUNTER — Telehealth: Payer: Self-pay | Admitting: Family Medicine

## 2013-10-10 NOTE — Telephone Encounter (Signed)
Patient states quantity of Lipitor was incorrect. She be getting 90 day supply. Patient would like this corrected.

## 2013-10-11 MED ORDER — ATORVASTATIN CALCIUM 80 MG PO TABS: 80.0000 mg | ORAL_TABLET | Freq: Every day | ORAL | Status: DC

## 2013-10-11 NOTE — Telephone Encounter (Signed)
Pt notified.  [place]

## 2013-10-11 NOTE — Telephone Encounter (Signed)
Refilled lipitor 80mg  for 90 days. Sent it to [facility]. Please let patient know. Thank you!  Liam Graham, PGY-3 Family Medicine Resident

## 2013-10-17 ENCOUNTER — Telehealth: Payer: Self-pay | Admitting: Family Medicine

## 2013-10-17 NOTE — Telephone Encounter (Signed)
Instructed patient to please make an appt to discuss referral with PCP.  Pt verbalized understanding.  [place]

## 2013-10-17 NOTE — Telephone Encounter (Signed)
Patient requesting a referral to a Foot care center for neuropathy in leg & feet.

## 2013-10-24 ENCOUNTER — Ambulatory Visit (INDEPENDENT_AMBULATORY_CARE_PROVIDER_SITE_OTHER): Payer: PRIVATE HEALTH INSURANCE | Admitting: Family Medicine

## 2013-10-24 ENCOUNTER — Encounter: Payer: Self-pay | Admitting: Family Medicine

## 2013-10-24 VITALS — BP 108/65 | HR 72 | Ht 67.0 in | Wt 158.0 lb

## 2013-10-24 DIAGNOSIS — G609 Hereditary and idiopathic neuropathy, unspecified: Secondary | ICD-10-CM

## 2013-10-24 NOTE — Assessment & Plan Note (Signed)
Stable. No focal deficits. Counseled on changing med to lyrica. Pt will consider. F/u PRN.

## 2013-10-24 NOTE — Progress Notes (Signed)
Subjective:    Patient ID: [name], female    DOB: 02-14-1928, 80 y.o.   MRN: [id number]  HPI  Evaluation of feet. Pt is concerned about her diabetic neuropathy. She is concerned about pain and numbness in her legs. Her left leg is "numb" and she says for a "long time." She is taking gabapentin, but does not think that it helps. No new trauma or weakness.    Current Outpatient Prescriptions on File Prior to Visit  Medication Sig Dispense Refill  . albuterol (PROVENTIL HFA;VENTOLIN HFA) 108 (90 BASE) MCG/ACT inhaler Inhale 2 puffs into the lungs every 6 (six) hours as needed for wheezing or shortness of breath.  3 Inhaler  1  . albuterol (PROVENTIL) (2.5 MG/3ML) 0.083% nebulizer solution Take 2.5 mg by nebulization every 6 (six) hours as needed for wheezing or shortness of breath.      Marland Kitchen amLODipine (NORVASC) 5 MG tablet Take 1 tablet by mouth  daily  90 tablet  3  . aspirin EC 81 MG tablet Take 81 mg by mouth daily.      Marland Kitchen atorvastatin (LIPITOR) 80 MG tablet Take 1 tablet (80 mg total) by mouth at bedtime.  90 tablet  5  . Calcium Carbonate-Vitamin D (CALCIUM 600+D) 600-200 MG-UNIT TABS Take 1 tablet by mouth daily.       . carvedilol (COREG) 6.25 MG tablet Take 1 tablet by mouth  twice a day with meals  180 tablet  3  . Chromium-Cinnamon (CINNAMON PLUS CHROMIUM PO) Take 1 capsule by mouth 2 (two) times daily. 2mcg/500mg       . CINNAMON PO Take by mouth daily.      Marland Kitchen docusate sodium (COLACE) 100 MG capsule Take 200 mg by mouth 2 (two) times daily.      . furosemide (LASIX) 20 MG tablet take 1 tablet by mouth once daily  90 tablet  3  . gabapentin (NEURONTIN) 300 MG capsule Take 1 capsule (300 mg total) by mouth daily.  30 capsule  5  . losartan (COZAAR) 50 MG tablet take 1 tablet by mouth once daily  90 tablet  3  . Omega-3 Fatty Acids (FISH OIL) 1000 MG CAPS Take 1 capsule by mouth 2 (two) times daily.       Marland Kitchen omeprazole (PRILOSEC) 20 MG capsule Take 20 mg by mouth daily.      .  Polyvinyl Alcohol-Povidone (REFRESH OP) Place 1 drop into both eyes 3 (three) times daily as needed (dry eyes).      . vitamin C (ASCORBIC ACID) 500 MG tablet Take 500 mg by mouth 2 (two) times daily.      Marland Kitchen warfarin (COUMADIN) 5 MG tablet Take 1 tablet by mouth daily or as directed  90 tablet  1   Current Facility-Administered Medications on File Prior to Visit  Medication Dose Route Frequency Provider Last Rate Last Dose  . 0.9 %  sodium chloride infusion   Intravenous Continuous Pixie Casino, MD      . sodium chloride 0.9 % injection 3 mL  3 mL Intravenous Q12H Pixie Casino, MD         Review of Systems     Objective:   Physical Exam BP 108/65  Pulse 72  Ht 5\' 7"  (1.702 m)  Wt 158 lb (71.668 kg)  BMI 24.74 kg/m2  Gen: elderly female, non ill appearing  Feet: hammer toe of left 2nd toe with healing corn on distal pad of foot;  Neuro: decreased sensation of left anterior shin, but sensation still intact with monofilament testing, no foot drop      Assessment & Plan:

## 2013-10-24 NOTE — Patient Instructions (Signed)
[name], Show to see you.   Neuropathy - We don't need to do anything different today, but we can consider lyrica in the future.   For [name], he can come anytime to see me anytime.   Come back in 1 month.   Dr. Maricela Bo

## 2013-11-02 ENCOUNTER — Other Ambulatory Visit: Payer: Self-pay | Admitting: *Deleted

## 2013-11-02 ENCOUNTER — Ambulatory Visit: Payer: PRIVATE HEALTH INSURANCE | Admitting: Pharmacist Clinician (PhC)/ Clinical Pharmacy Specialist

## 2013-11-02 DIAGNOSIS — E1140 Type 2 diabetes mellitus with diabetic neuropathy, unspecified: Secondary | ICD-10-CM

## 2013-11-02 MED ORDER — OMEPRAZOLE 20 MG PO CPDR: 20.0000 mg | DELAYED_RELEASE_CAPSULE | Freq: Every day | ORAL | Status: DC

## 2013-11-02 MED ORDER — GABAPENTIN 300 MG PO CAPS: 300.0000 mg | ORAL_CAPSULE | Freq: Every day | ORAL | Status: DC

## 2013-11-02 MED ORDER — AMLODIPINE BESYLATE 5 MG PO TABS: ORAL_TABLET | ORAL | Status: DC

## 2013-11-02 MED ORDER — CARVEDILOL 6.25 MG PO TABS: ORAL_TABLET | ORAL | Status: DC

## 2013-11-02 MED ORDER — ATORVASTATIN CALCIUM 80 MG PO TABS: 80.0000 mg | ORAL_TABLET | Freq: Every day | ORAL | Status: DC

## 2013-11-03 ENCOUNTER — Other Ambulatory Visit: Payer: Self-pay | Admitting: *Deleted

## 2013-11-03 MED ORDER — LOSARTAN POTASSIUM 50 MG PO TABS: 50.0000 mg | ORAL_TABLET | Freq: Every day | ORAL | Status: DC

## 2013-11-03 MED ORDER — FUROSEMIDE 20 MG PO TABS: 20.0000 mg | ORAL_TABLET | Freq: Every day | ORAL | Status: DC

## 2013-11-03 NOTE — Telephone Encounter (Signed)
Rx was sent to pharmacy electronically. 

## 2013-11-04 ENCOUNTER — Ambulatory Visit: Payer: PRIVATE HEALTH INSURANCE | Admitting: Family Medicine

## 2013-11-07 ENCOUNTER — Ambulatory Visit: Payer: PRIVATE HEALTH INSURANCE | Admitting: Family Medicine

## 2013-11-09 ENCOUNTER — Ambulatory Visit: Payer: PRIVATE HEALTH INSURANCE | Admitting: Pharmacist Clinician (PhC)/ Clinical Pharmacy Specialist

## 2013-11-15 ENCOUNTER — Ambulatory Visit: Payer: PRIVATE HEALTH INSURANCE | Admitting: Pharmacist Clinician (PhC)/ Clinical Pharmacy Specialist

## 2013-11-16 ENCOUNTER — Ambulatory Visit (INDEPENDENT_AMBULATORY_CARE_PROVIDER_SITE_OTHER): Payer: PRIVATE HEALTH INSURANCE | Admitting: Pharmacist Clinician (PhC)/ Clinical Pharmacy Specialist

## 2013-11-16 ENCOUNTER — Ambulatory Visit: Payer: PRIVATE HEALTH INSURANCE | Admitting: Pharmacist Clinician (PhC)/ Clinical Pharmacy Specialist

## 2013-11-16 DIAGNOSIS — Z7901 Long term (current) use of anticoagulants: Secondary | ICD-10-CM

## 2013-11-16 DIAGNOSIS — I4891 Unspecified atrial fibrillation: Secondary | ICD-10-CM

## 2013-11-16 LAB — POCT INR: INR: 3.6

## 2013-11-23 ENCOUNTER — Ambulatory Visit (INDEPENDENT_AMBULATORY_CARE_PROVIDER_SITE_OTHER): Payer: PRIVATE HEALTH INSURANCE | Admitting: Family Medicine

## 2013-11-23 ENCOUNTER — Encounter: Payer: Self-pay | Admitting: Family Medicine

## 2013-11-23 VITALS — BP 123/51 | HR 66 | Temp 98.1°F | Ht 67.0 in | Wt 161.3 lb

## 2013-11-23 DIAGNOSIS — E119 Type 2 diabetes mellitus without complications: Secondary | ICD-10-CM

## 2013-11-23 DIAGNOSIS — I10 Essential (primary) hypertension: Secondary | ICD-10-CM

## 2013-11-23 LAB — POCT GLYCOSYLATED HEMOGLOBIN (HGB A1C): HEMOGLOBIN A1C: 7.2

## 2013-11-23 NOTE — Progress Notes (Signed)
Subjective:    Patient ID: [name], female    DOB: [birth year], 80 y.o.   MRN: [id number]  HPI  Diabetes  Recent Issues:  Medication Compliance:  Diabetes medication: Yes - taking glipizide 2.5 mg 2-3 times in last 3 mos for CBG > 150  Taking ACE-I: Yes  Taking statin: Yes  Behavioral:  Home CBG Monitoring: Yes, CBG 136  Diet changes: Yes  Exercise: No  Health Maintenance:  Foot ulcers: No Vaccinations: had pneumovax, needs prevnar 13 Eye Exam: none recent, patient would like a referral to [facility]  Hypertension - patient is worried that her BP is too low, she denies syncope or lightheadedness   Office BP: BP Readings from Last 3 Encounters:  11/23/13 123/51  10/24/13 108/65  10/05/13 90/52    Prescribed meds: amlodipine, furosemide, losartan  Hypertension ROS:  Taking medications as prescribed:Yes Chest pain: No Shortness of breath: No Swelling of extremities: No TIA symptoms: No Regular exercise: No Low Na+ diet: No Alcohol/tobacco/drug use: No   Current Outpatient Prescriptions on File Prior to Visit  Medication Sig Dispense Refill  . albuterol (PROVENTIL HFA;VENTOLIN HFA) 108 (90 BASE) MCG/ACT inhaler Inhale 2 puffs into the lungs every 6 (six) hours as needed for wheezing or shortness of breath.  3 Inhaler  1  . albuterol (PROVENTIL) (2.5 MG/3ML) 0.083% nebulizer solution Take 2.5 mg by nebulization every 6 (six) hours as needed for wheezing or shortness of breath.      Marland Kitchen amLODipine (NORVASC) 5 MG tablet Take 1 tablet by mouth  daily  90 tablet  3  . aspirin EC 81 MG tablet Take 81 mg by mouth daily.      Marland Kitchen atorvastatin (LIPITOR) 80 MG tablet Take 1 tablet (80 mg total) by mouth at bedtime.  90 tablet  3  . Calcium Carbonate-Vitamin D (CALCIUM 600+D) 600-200 MG-UNIT TABS Take 1 tablet by mouth daily.       . carvedilol (COREG) 6.25 MG tablet Take 1 tablet by mouth  twice a day with meals  180 tablet  3  . Chromium-Cinnamon (CINNAMON  PLUS CHROMIUM PO) Take 1 capsule by mouth 2 (two) times daily. 71mcg/500mg       . CINNAMON PO Take by mouth daily.      Marland Kitchen docusate sodium (COLACE) 100 MG capsule Take 200 mg by mouth 2 (two) times daily.      . furosemide (LASIX) 20 MG tablet Take 1 tablet (20 mg total) by mouth daily.  90 tablet  3  . gabapentin (NEURONTIN) 300 MG capsule Take 1 capsule (300 mg total) by mouth daily.  30 capsule  5  . losartan (COZAAR) 50 MG tablet Take 1 tablet (50 mg total) by mouth daily.  90 tablet  3  . Omega-3 Fatty Acids (FISH OIL) 1000 MG CAPS Take 1 capsule by mouth 2 (two) times daily.       Marland Kitchen omeprazole (PRILOSEC) 20 MG capsule Take 1 capsule (20 mg total) by mouth daily.  30 capsule  5  . Polyvinyl Alcohol-Povidone (REFRESH OP) Place 1 drop into both eyes 3 (three) times daily as needed (dry eyes).      . vitamin C (ASCORBIC ACID) 500 MG tablet Take 500 mg by mouth 2 (two) times daily.      Marland Kitchen warfarin (COUMADIN) 5 MG tablet Take 1 tablet by mouth daily or as directed  90 tablet  1   Current Facility-Administered Medications on File Prior to Visit  Medication Dose  Route Frequency Provider Last Rate Last Dose  . 0.9 %  sodium chloride infusion   Intravenous Continuous Pixie Casino, MD      . sodium chloride 0.9 % injection 3 mL  3 mL Intravenous Q12H Pixie Casino, MD         Review of Systems See HPI    Objective:   Physical Exam BP 123/51  Pulse 66  Temp(Src) 98.1 F (36.7 C) (Oral)  Ht 5\' 7"  (1.702 m)  Wt 161 lb 4.8 oz (73.165 kg)  BMI 25.26 kg/m2   General: alert, well appearing, and in no distress, oriented to person, place, and time and normal appearing weight  Eyes: deferred  Cardiovascular: irregularly irregular   Pulmonary: clear to auscultation bilaterally Extremities: 1+ pre-tibial edema  Foot: sensation intact, no ulcers or calcifications        Assessment & Plan:

## 2013-11-23 NOTE — Patient Instructions (Signed)
Dear [name],   Thank you for coming to clinic today. Please read below regarding the issues that we discussed.   1. Diabetes - Please continue to use th glipizide only when your sugar is over 150. The A1C today is 7.2, which is fine.   2. Eye Exam - I will refer you to [facility] opthalmology for an eye check.   3. Blood Pressure - Continue the same medications, but bring them back in 2 weeks.   Please follow up in clinic in 2 weeks. Please call earlier if you have any questions or concerns.   Sincerely,   Dr. Maricela Bo

## 2013-11-24 NOTE — Assessment & Plan Note (Signed)
Pt may be over-treated with medication for BP. Consider decreasing losartan to 25 mg daily. Recheck in 2 weeks.

## 2013-11-24 NOTE — Assessment & Plan Note (Signed)
A: well controlled with A1C of 7.2 (goal is 7.5) P:  - cont glipizide 2.5 mg PO PRN for CBG > 150 since patient very concerned about sugar getting too high - counseled patient on dangers of low CBG in elderly

## 2013-12-04 ENCOUNTER — Telehealth: Payer: Self-pay | Admitting: Cardiology

## 2013-12-04 NOTE — Telephone Encounter (Signed)
Pt called to report she had forgotten to take her coumadin on Thursday and Friday.  She did take 5 mg yesterday Sat. I asked her to take 7.5 mg today (Sunday) and then back to 5 mg daily.  She is to be seen in Coumadin clinic on Wed.  She was agreeable to this plan.

## 2013-12-05 ENCOUNTER — Ambulatory Visit: Payer: PRIVATE HEALTH INSURANCE | Admitting: Family Medicine

## 2013-12-06 ENCOUNTER — Other Ambulatory Visit: Payer: Self-pay | Admitting: Orthopedic Surgery

## 2013-12-06 DIAGNOSIS — M545 Low back pain, unspecified: Secondary | ICD-10-CM

## 2013-12-07 ENCOUNTER — Ambulatory Visit: Payer: PRIVATE HEALTH INSURANCE | Admitting: Family Medicine

## 2013-12-07 ENCOUNTER — Ambulatory Visit (INDEPENDENT_AMBULATORY_CARE_PROVIDER_SITE_OTHER): Payer: PRIVATE HEALTH INSURANCE | Admitting: Pharmacist Clinician (PhC)/ Clinical Pharmacy Specialist

## 2013-12-07 DIAGNOSIS — I4891 Unspecified atrial fibrillation: Secondary | ICD-10-CM

## 2013-12-07 DIAGNOSIS — Z7901 Long term (current) use of anticoagulants: Secondary | ICD-10-CM

## 2013-12-07 LAB — POCT INR: INR: 1.7

## 2013-12-19 ENCOUNTER — Ambulatory Visit
Admission: RE | Admit: 2013-12-19 | Discharge: 2013-12-19 | Disposition: A | Discharge status: Home / Self Care | Payer: PRIVATE HEALTH INSURANCE | Source: Ambulatory Visit | Attending: Orthopedic Surgery | Admitting: Orthopedic Surgery

## 2013-12-19 DIAGNOSIS — M545 Low back pain, unspecified: Secondary | ICD-10-CM

## 2013-12-28 ENCOUNTER — Ambulatory Visit (INDEPENDENT_AMBULATORY_CARE_PROVIDER_SITE_OTHER): Payer: PRIVATE HEALTH INSURANCE | Admitting: Pharmacist Clinician (PhC)/ Clinical Pharmacy Specialist

## 2013-12-28 DIAGNOSIS — I4891 Unspecified atrial fibrillation: Secondary | ICD-10-CM

## 2013-12-28 DIAGNOSIS — Z7901 Long term (current) use of anticoagulants: Secondary | ICD-10-CM

## 2013-12-28 LAB — POCT INR: INR: 4.1

## 2014-01-02 ENCOUNTER — Other Ambulatory Visit: Payer: Self-pay | Admitting: *Deleted

## 2014-01-03 ENCOUNTER — Other Ambulatory Visit: Payer: Self-pay | Admitting: Family Medicine

## 2014-01-05 ENCOUNTER — Other Ambulatory Visit: Payer: Self-pay | Admitting: Family Medicine

## 2014-01-05 NOTE — Telephone Encounter (Signed)
Refill form completed and placed into the faxed basket Warfarin 5 mg 90 tablets one refill Glipizide 2.5 mg by mouth when necessary for blood sugars greater than 150  Rosemarie Ax, MD PGY-2, [facility] 01/05/2014, 11:57 AM

## 2014-01-09 ENCOUNTER — Telehealth: Payer: Self-pay | Admitting: Cardiology

## 2014-01-09 NOTE — Telephone Encounter (Signed)
Ms. Curvin called to report she just "belched and "a little blood came up."  "Looked like spit with some blood." She looked in her mouth and didn't see a sore or cut. Denies trauma to the mouth, recent brushing of teeth. No abdominal pain, nausea, vomiting. No dark stools. No blood on TP paper during recent BMs. No history of GI bleeds. Feels "good I...don't feel nothing."  Ate banana muffin for dinner only. Didn't eat anything red for dinner.   Of note, INR = 4.1 on 7/1. No recheck since per patient.   She was apparently told to take 1/2 of her usual dose this morning.  She takes warfarin in AM rather than PM.    Plan - OK to stay at home unless she has a recurrence or feels any change in which case she is to come to the ER immediately. - Hold warfarin tomorrow morning - Call Dr. Lysbeth Penner office to arrange to get CBC and INR checked in the morning  Pt in agreement with above plan.

## 2014-01-10 ENCOUNTER — Ambulatory Visit: Payer: PRIVATE HEALTH INSURANCE | Admitting: *Deleted

## 2014-01-10 ENCOUNTER — Telehealth: Payer: Self-pay | Admitting: Internal Medicine

## 2014-01-10 ENCOUNTER — Ambulatory Visit (INDEPENDENT_AMBULATORY_CARE_PROVIDER_SITE_OTHER): Payer: PRIVATE HEALTH INSURANCE | Admitting: Pharmacist

## 2014-01-10 DIAGNOSIS — R042 Hemoptysis: Secondary | ICD-10-CM

## 2014-01-10 DIAGNOSIS — I4891 Unspecified atrial fibrillation: Secondary | ICD-10-CM

## 2014-01-10 DIAGNOSIS — Z7901 Long term (current) use of anticoagulants: Secondary | ICD-10-CM

## 2014-01-10 LAB — CBC
HCT: 36.4 % (ref 36.0–46.0)
Hemoglobin: 12.3 g/dL (ref 12.0–15.0)
MCH: 30.6 pg (ref 26.0–34.0)
MCHC: 33.8 g/dL (ref 30.0–36.0)
MCV: 90.5 fL (ref 78.0–100.0)
PLATELETS: 202 10*3/uL (ref 150–400)
RBC: 4.02 MIL/uL (ref 3.87–5.11)
RDW: 14.6 % (ref 11.5–15.5)
WBC: 7.9 10*3/uL (ref 4.0–10.5)

## 2014-01-10 LAB — POCT INR: INR: 1.2

## 2014-01-10 NOTE — Patient Instructions (Addendum)
  GO TO LAB AND HAVE CBC DRAW.  INFORMED PATIENT THE OFFICE WILL CALL WITH INSTRUCTION ON HOW TO TAKE WARFARIN IN THE COMING WEEK

## 2014-01-10 NOTE — Telephone Encounter (Signed)
Spoke to patient . Per Dr Luiz Ochoa, schedule INR CHECK, and CBC will be order. Patient will come into the office today.

## 2014-01-10 NOTE — Progress Notes (Signed)
Patient spoke to doctor on call last night Per order, INR done today. Lab slip for CBC given to patient,  Patient states it was only one episode- last evening "with a little blood in sputum after belching. No other symptoms.  Patient states she did not tke warfarin this morning per order of on-call doctor.  Result INR is 1.2  Will send information to COUMADIN CLINIC/[facility]  To dose.  Patient is aware.

## 2014-01-10 NOTE — Telephone Encounter (Signed)
Was Spitting up a little blood on last night ans was told by her doctor to come in today and have her coumadin check . Please call    Thanks

## 2014-01-10 NOTE — Progress Notes (Signed)
Spoke with pt.  Explained bleeding was likely not related to Coumadin given INR of 1.2.  Increased Coumadin dose and pt is scheduled to have INR rechecked next week.   See anticoag note for details.

## 2014-01-10 NOTE — Telephone Encounter (Signed)
Patient had INR  Checked 1.2. Information sent to coumadin clinic staff

## 2014-01-13 ENCOUNTER — Other Ambulatory Visit: Payer: Self-pay | Admitting: *Deleted

## 2014-01-17 ENCOUNTER — Telehealth: Payer: Self-pay | Admitting: Family Medicine

## 2014-01-17 NOTE — Telephone Encounter (Signed)
Pt called and needs Dr. Raeford Razor to make sure all her medications are called in at 47 qty for the insurance to cover this and also her husband [name] also must be called in at 34 qty. Please call her to discuss. jw

## 2014-01-17 NOTE — Telephone Encounter (Signed)
Second request.  Derl Barrow, RN

## 2014-01-18 ENCOUNTER — Telehealth: Payer: Self-pay | Admitting: Family Medicine

## 2014-01-18 ENCOUNTER — Ambulatory Visit (INDEPENDENT_AMBULATORY_CARE_PROVIDER_SITE_OTHER): Payer: PRIVATE HEALTH INSURANCE | Admitting: Pharmacist Clinician (PhC)/ Clinical Pharmacy Specialist

## 2014-01-18 DIAGNOSIS — E0843 Diabetes mellitus due to underlying condition with diabetic autonomic (poly)neuropathy: Secondary | ICD-10-CM

## 2014-01-18 DIAGNOSIS — Z7901 Long term (current) use of anticoagulants: Secondary | ICD-10-CM

## 2014-01-18 DIAGNOSIS — I4891 Unspecified atrial fibrillation: Secondary | ICD-10-CM

## 2014-01-18 LAB — POCT INR: INR: 3.2

## 2014-01-18 MED ORDER — ALBUTEROL SULFATE HFA 108 (90 BASE) MCG/ACT IN AERS: 2.0000 | INHALATION_SPRAY | Freq: Four times a day (QID) | RESPIRATORY_TRACT | Status: DC | PRN

## 2014-01-18 MED ORDER — GABAPENTIN 300 MG PO CAPS: 300.0000 mg | ORAL_CAPSULE | Freq: Every day | ORAL | Status: DC

## 2014-01-18 NOTE — Telephone Encounter (Signed)
Patient requesting refill for nebulizer meds to use for her bronchitis PRN. Please advise.

## 2014-01-18 NOTE — Telephone Encounter (Signed)
Refilled patient's nebulizer. All medications have 90 count other than gabapentin. Will send this.  Rosemarie Ax, MD PGY-2, [facility] 01/18/2014, 12:35 PM

## 2014-01-18 NOTE — Telephone Encounter (Signed)
Pt called back to give the doctor the number to call for the nebulizer. [phone]. jw

## 2014-01-27 ENCOUNTER — Telehealth: Payer: Self-pay | Admitting: Pharmacist Clinician (PhC)/ Clinical Pharmacy Specialist

## 2014-01-27 NOTE — Telephone Encounter (Signed)
Pt took double dose yesterday, advised to hold dose today, resume tomorrow.  Pt voiced understanding

## 2014-02-01 ENCOUNTER — Ambulatory Visit: Payer: PRIVATE HEALTH INSURANCE | Admitting: Pharmacist Clinician (PhC)/ Clinical Pharmacy Specialist

## 2014-02-03 ENCOUNTER — Ambulatory Visit (INDEPENDENT_AMBULATORY_CARE_PROVIDER_SITE_OTHER): Payer: PRIVATE HEALTH INSURANCE | Admitting: Pharmacist Clinician (PhC)/ Clinical Pharmacy Specialist

## 2014-02-03 VITALS — BP 138/60 | HR 76

## 2014-02-03 DIAGNOSIS — Z7901 Long term (current) use of anticoagulants: Secondary | ICD-10-CM

## 2014-02-03 DIAGNOSIS — I4891 Unspecified atrial fibrillation: Secondary | ICD-10-CM

## 2014-02-03 LAB — POCT INR: INR: 3.3

## 2014-02-08 ENCOUNTER — Ambulatory Visit: Payer: PRIVATE HEALTH INSURANCE | Admitting: Family Medicine

## 2014-02-17 ENCOUNTER — Ambulatory Visit (INDEPENDENT_AMBULATORY_CARE_PROVIDER_SITE_OTHER): Payer: PRIVATE HEALTH INSURANCE | Admitting: Pharmacist Clinician (PhC)/ Clinical Pharmacy Specialist

## 2014-02-17 VITALS — BP 124/60 | HR 76

## 2014-02-17 DIAGNOSIS — I4891 Unspecified atrial fibrillation: Secondary | ICD-10-CM

## 2014-02-17 DIAGNOSIS — Z7901 Long term (current) use of anticoagulants: Secondary | ICD-10-CM

## 2014-02-17 LAB — POCT INR: INR: 3.1

## 2014-02-21 LAB — HM DIABETES EYE EXAM

## 2014-03-05 ENCOUNTER — Telehealth: Payer: Self-pay | Admitting: Family Medicine

## 2014-03-05 DIAGNOSIS — J209 Acute bronchitis, unspecified: Secondary | ICD-10-CM

## 2014-03-05 MED ORDER — ALBUTEROL SULFATE (2.5 MG/3ML) 0.083% IN NEBU: 2.5000 mg | INHALATION_SOLUTION | Freq: Four times a day (QID) | RESPIRATORY_TRACT | Status: DC | PRN

## 2014-03-05 NOTE — Telephone Encounter (Signed)
Emergency Line / After Hours Call  Patient requesting nebulizer therapy. She reports having bronchitis. Her symptoms of having breathing problems. Having cough with no production. Recently had a cold. Last used the inhaler at 0500 and last used the nebulizer for years. This feels similar to previous episodes of bronchitis. No fevers, nausea, vomiting. No leg swelling and no palpitations.   Has appt with PCP on March 21, 2014. Nebulizer solution 75 mL with no refills with precautions given on going go the ED.   Clearance Coots, MD Family Medicine PGY-2

## 2014-03-07 ENCOUNTER — Telehealth: Payer: Self-pay | Admitting: Family Medicine

## 2014-03-07 NOTE — Telephone Encounter (Signed)
Pt called and can not find her Nebulizer so she needs the doctor to call one into [facility] at [phone]. She needs this ASAP because she has been some breathing issues. jw

## 2014-03-08 ENCOUNTER — Telehealth: Payer: Self-pay | Admitting: Family Medicine

## 2014-03-08 NOTE — Telephone Encounter (Signed)
Called patient and told her it looks like her albuterol inhaler was sent to the [facility] 3 days ago. She is going to check to make sure that have it then go pick it up.[place], Kevin Fenton

## 2014-03-08 NOTE — Telephone Encounter (Signed)
Pls send to [facility] at [phone].

## 2014-03-08 NOTE — Telephone Encounter (Signed)
Pt called back today because she still has not received her nebulizer. jw

## 2014-03-08 NOTE — Telephone Encounter (Signed)
Pt called back and needs the actual nebulizer machine called in. jw

## 2014-03-08 NOTE — Telephone Encounter (Signed)
Forward to PCP for Rx

## 2014-03-09 NOTE — Telephone Encounter (Signed)
Pt called again about the nebulizer

## 2014-03-09 NOTE — Telephone Encounter (Signed)
The nebulizer needs to be called in to [facility] has a solution.

## 2014-03-10 ENCOUNTER — Telehealth: Payer: Self-pay | Admitting: Family Medicine

## 2014-03-10 DIAGNOSIS — J209 Acute bronchitis, unspecified: Secondary | ICD-10-CM

## 2014-03-10 MED ORDER — ALBUTEROL SULFATE (2.5 MG/3ML) 0.083% IN NEBU: 2.5000 mg | INHALATION_SOLUTION | Freq: Four times a day (QID) | RESPIRATORY_TRACT | Status: DC | PRN

## 2014-03-10 NOTE — Telephone Encounter (Signed)
Faxed out scripts

## 2014-03-10 NOTE — Telephone Encounter (Signed)
Will reprint rx and fax to them today

## 2014-03-10 NOTE — Telephone Encounter (Signed)
Pt has been calling for a few days now to get the nebulizer machine for the solution. She needs this desperately this since she is having trouble breathing. Pls send to [facility] at [phone]. Blima Rich

## 2014-03-13 NOTE — Telephone Encounter (Signed)
Needs nebulizer really bad. She is wheezing pretty bad

## 2014-03-14 ENCOUNTER — Other Ambulatory Visit: Payer: Self-pay | Admitting: Family Medicine

## 2014-03-14 DIAGNOSIS — J209 Acute bronchitis, unspecified: Secondary | ICD-10-CM

## 2014-03-14 MED ORDER — ALBUTEROL SULFATE (2.5 MG/3ML) 0.083% IN NEBU: 2.5000 mg | INHALATION_SOLUTION | Freq: Four times a day (QID) | RESPIRATORY_TRACT | Status: DC | PRN

## 2014-03-14 NOTE — Telephone Encounter (Signed)
PLEASE get her nebulizer called into [facility] [phone] fax number [phone] phone number She has the solution but not the machine

## 2014-03-14 NOTE — Telephone Encounter (Signed)
Called [facility] to inquire about nebulizer machine; pt misplaced her machine. Per Huey Romans she can't get another machine until 2016.  Referral sent to [facility] for nebulizer along with Rx.  Derl Barrow, RN

## 2014-03-15 ENCOUNTER — Ambulatory Visit (INDEPENDENT_AMBULATORY_CARE_PROVIDER_SITE_OTHER): Payer: PRIVATE HEALTH INSURANCE | Admitting: Pharmacist Clinician (PhC)/ Clinical Pharmacy Specialist

## 2014-03-15 DIAGNOSIS — Z7901 Long term (current) use of anticoagulants: Secondary | ICD-10-CM

## 2014-03-15 DIAGNOSIS — I4891 Unspecified atrial fibrillation: Secondary | ICD-10-CM

## 2014-03-15 LAB — POCT INR: INR: 2.1

## 2014-03-21 ENCOUNTER — Ambulatory Visit (INDEPENDENT_AMBULATORY_CARE_PROVIDER_SITE_OTHER): Payer: PRIVATE HEALTH INSURANCE | Admitting: Family Medicine

## 2014-03-21 ENCOUNTER — Encounter: Payer: Self-pay | Admitting: Family Medicine

## 2014-03-21 VITALS — BP 129/55 | HR 75 | Temp 97.4°F | Resp 18 | Wt 159.0 lb

## 2014-03-21 DIAGNOSIS — E1169 Type 2 diabetes mellitus with other specified complication: Secondary | ICD-10-CM

## 2014-03-21 DIAGNOSIS — Z23 Encounter for immunization: Secondary | ICD-10-CM

## 2014-03-21 DIAGNOSIS — I10 Essential (primary) hypertension: Secondary | ICD-10-CM

## 2014-03-21 DIAGNOSIS — E119 Type 2 diabetes mellitus without complications: Secondary | ICD-10-CM

## 2014-03-21 LAB — POCT GLYCOSYLATED HEMOGLOBIN (HGB A1C): HEMOGLOBIN A1C: 6.6

## 2014-03-21 NOTE — Patient Instructions (Addendum)
Thank you for coming in,   We will call you with the results from today's labs.    Please feel free to call with any questions or concerns at any time, at [phone]. --Dr. Raeford Razor  Diet Recommendations for Diabetes   Starchy (carb) foods include: Bread, rice, pasta, potatoes, corn, crackers, bagels, muffins, all baked goods.   Protein foods include: Meat, fish, poultry, eggs, dairy foods, and beans such as pinto and kidney beans (beans also provide carbohydrate).   1. Eat at least 3 meals and 1-2 snacks per day. Never go more than 4-5 hours while awake without eating.  2. Limit starchy foods to TWO per meal and ONE per snack. ONE portion of a starchy  food is equal to the following:   - ONE slice of bread (or its equivalent, such as half of a hamburger bun).   - 1/2 cup of a "scoopable" starchy food such as potatoes or rice.   - 1 OUNCE (28 grams) of starchy snack foods such as crackers or pretzels (look on label).   - 15 grams of carbohydrate as shown on food label.  3. Both lunch and dinner should include a protein food, a carb food, and vegetables.   - Obtain twice as many veg's as protein or carbohydrate foods for both lunch and dinner.   - Try to keep frozen veg's on hand for a quick vegetable serving.     - Fresh or frozen veg's are best.  4. Breakfast should always include protein.

## 2014-03-21 NOTE — Progress Notes (Signed)
Subjective:    Patient ID: [name], female    DOB: [birth year], 80 y.o.   MRN: [id number]  HPI  [name] is here for f/u for HTN, DM, and bronchitis.   HTN Home BP Monitoring checked at INR Chest pain- no    Dyspnea- no Medications: losartan, amldoidpine  Compliance-  Yes . Lightheadedness-  Yes   Edema- sometimes if she has to stand on them a lot.   CHRONIC DIABETES  Disease Monitoring  Blood Sugar Ranges: only takes glipizide if blood sugar is above 150  Polyuria: yes, drinks a lot of water.    Visual problems: no   Medication Compliance: yes  Medication Side Effects  Hypoglycemia: no    Preventitive Health Care  Eye Exam: recently checked   Foot Exam: today   Diet pattern: balanced   Exercise: none   She reports having bronchitis. She has shortness of breath but this is relieved with the nebulizer inhaler. She feels much better today. She has no complaints.     Current Outpatient Prescriptions on File Prior to Visit  Medication Sig Dispense Refill  . albuterol (PROVENTIL HFA;VENTOLIN HFA) 108 (90 BASE) MCG/ACT inhaler Inhale 2 puffs into the lungs every 6 (six) hours as needed for wheezing or shortness of breath.  3 Inhaler  1  . albuterol (PROVENTIL) (2.5 MG/3ML) 0.083% nebulizer solution Take 3 mLs (2.5 mg total) by nebulization every 6 (six) hours as needed for wheezing or shortness of breath.  120 vial  6  . amLODipine (NORVASC) 5 MG tablet Take 1 tablet by mouth  daily  90 tablet  3  . aspirin EC 81 MG tablet Take 81 mg by mouth daily.      Marland Kitchen atorvastatin (LIPITOR) 80 MG tablet Take 1 tablet (80 mg total) by mouth at bedtime.  90 tablet  3  . Calcium Carbonate-Vitamin D (CALCIUM 600+D) 600-200 MG-UNIT TABS Take 1 tablet by mouth daily.       . carvedilol (COREG) 6.25 MG tablet Take 1 tablet by mouth  twice a day with meals  180 tablet  3  . Chromium-Cinnamon (CINNAMON PLUS CHROMIUM PO) Take 1 capsule by mouth 2 (two) times daily. 15mcg/500mg       .  CINNAMON PO Take by mouth daily.      Marland Kitchen docusate sodium (COLACE) 100 MG capsule Take 200 mg by mouth 2 (two) times daily.      Marland Kitchen gabapentin (NEURONTIN) 300 MG capsule Take 1 capsule (300 mg total) by mouth daily.  90 capsule  5  . losartan (COZAAR) 50 MG tablet Take 1 tablet (50 mg total) by mouth daily.  90 tablet  3  . Omega-3 Fatty Acids (FISH OIL) 1000 MG CAPS Take 1 capsule by mouth 2 (two) times daily.       Marland Kitchen omeprazole (PRILOSEC) 20 MG capsule Take 1 capsule (20 mg total) by mouth daily.  30 capsule  5  . Polyvinyl Alcohol-Povidone (REFRESH OP) Place 1 drop into both eyes 3 (three) times daily as needed (dry eyes).      . vitamin C (ASCORBIC ACID) 500 MG tablet Take 500 mg by mouth 2 (two) times daily.      Marland Kitchen warfarin (COUMADIN) 5 MG tablet Take 1 tablet by mouth daily or as directed  90 tablet  1  . furosemide (LASIX) 20 MG tablet Take 1 tablet (20 mg total) by mouth daily.  90 tablet  3   Current Facility-Administered Medications on  File Prior to Visit  Medication Dose Route Frequency Provider Last Rate Last Dose  . 0.9 %  sodium chloride infusion   Intravenous Continuous Pixie Casino, MD      . sodium chloride 0.9 % injection 3 mL  3 mL Intravenous Q12H Pixie Casino, MD       Health Maintenance: to receive flu vaccine    Review of Systems See HPI     Objective:   Physical Exam BP 129/55  Pulse 75  Temp(Src) 97.4 F (36.3 C) (Oral)  Resp 18  Wt 159 lb (72.122 kg)  SpO2 97% Gen: NAD, alert, cooperative with exam, well-appearing CV: RRR, good S1/S2, no murmur, no edema,  Resp: CTABL, no wheezes, non-labored Ext: normal grip strength, normal gait but needs help stepping upon exam table.  Skin: no rashes, normal turgor  Neuro: no gross deficits.       Assessment & Plan:

## 2014-03-22 LAB — BASIC METABOLIC PANEL
BUN: 34 mg/dL — ABNORMAL HIGH (ref 6–23)
CALCIUM: 9.4 mg/dL (ref 8.4–10.5)
CO2: 26 meq/L (ref 19–32)
CREATININE: 1.55 mg/dL — AB (ref 0.50–1.10)
Chloride: 108 mEq/L (ref 96–112)
Glucose, Bld: 142 mg/dL — ABNORMAL HIGH (ref 70–99)
Potassium: 3.9 mEq/L (ref 3.5–5.3)
Sodium: 142 mEq/L (ref 135–145)

## 2014-03-23 ENCOUNTER — Telehealth: Payer: Self-pay | Admitting: Family Medicine

## 2014-03-23 NOTE — Telephone Encounter (Signed)
Request lab results from Ashley 9/22, along with her husband Ahniya Mitchum.

## 2014-03-24 NOTE — Telephone Encounter (Signed)
Spoke with Mrs. Helgeson and informed her of results for her and her husband

## 2014-03-24 NOTE — Telephone Encounter (Signed)
Message copied by Johny Shears on Fri Mar 24, 2014  9:05 AM ------      Message from: Rosemarie Ax      Created: Fri Mar 24, 2014  8:14 AM       Please call patient and inform her that her labs are at her baseline. Thank you. ------

## 2014-03-24 NOTE — Assessment & Plan Note (Addendum)
Controlled.  - Continue current regimen. Glipizide 2.5 mg PO PRN CBG >150

## 2014-03-24 NOTE — Assessment & Plan Note (Signed)
Controlled.  - continue current treatment.

## 2014-03-27 ENCOUNTER — Telehealth: Payer: Self-pay | Admitting: Family Medicine

## 2014-03-27 ENCOUNTER — Other Ambulatory Visit: Payer: Self-pay | Admitting: Family Medicine

## 2014-03-27 DIAGNOSIS — E1165 Type 2 diabetes mellitus with hyperglycemia: Principal | ICD-10-CM

## 2014-03-27 DIAGNOSIS — IMO0001 Reserved for inherently not codable concepts without codable children: Secondary | ICD-10-CM

## 2014-03-27 NOTE — Telephone Encounter (Signed)
Copied from Dr. Raeford Razor:  Please call patient and inform her that her Hgb A1c showed that her sugars are controlled. Future order was placed to have her liver function tested. Thank you.

## 2014-03-27 NOTE — Telephone Encounter (Signed)
Would like liver test because she takes Lipitor Also would like result on sugar

## 2014-03-27 NOTE — Telephone Encounter (Signed)
Pt informed.  Lab appt scheduled for tomorrow. [name], Nancy [name]

## 2014-03-28 ENCOUNTER — Other Ambulatory Visit: Payer: PRIVATE HEALTH INSURANCE

## 2014-03-28 DIAGNOSIS — IMO0001 Reserved for inherently not codable concepts without codable children: Secondary | ICD-10-CM

## 2014-03-28 DIAGNOSIS — E1165 Type 2 diabetes mellitus with hyperglycemia: Principal | ICD-10-CM

## 2014-03-28 LAB — HEPATIC FUNCTION PANEL
ALT: 13 U/L (ref 0–35)
AST: 15 U/L (ref 0–37)
Albumin: 4 g/dL (ref 3.5–5.2)
Alkaline Phosphatase: 57 U/L (ref 39–117)
BILIRUBIN DIRECT: 0.1 mg/dL (ref 0.0–0.3)
BILIRUBIN INDIRECT: 0.6 mg/dL (ref 0.2–1.2)
TOTAL PROTEIN: 6.2 g/dL (ref 6.0–8.3)
Total Bilirubin: 0.7 mg/dL (ref 0.2–1.2)

## 2014-03-28 NOTE — Progress Notes (Signed)
HEPATIC PANEL DONE TODAY Florine Sprenkle

## 2014-04-12 ENCOUNTER — Ambulatory Visit (INDEPENDENT_AMBULATORY_CARE_PROVIDER_SITE_OTHER): Payer: PRIVATE HEALTH INSURANCE | Admitting: Pharmacist Clinician (PhC)/ Clinical Pharmacy Specialist

## 2014-04-12 DIAGNOSIS — I4891 Unspecified atrial fibrillation: Secondary | ICD-10-CM

## 2014-04-12 DIAGNOSIS — Z7901 Long term (current) use of anticoagulants: Secondary | ICD-10-CM

## 2014-04-12 LAB — POCT INR: INR: 2.4

## 2014-05-10 ENCOUNTER — Ambulatory Visit (INDEPENDENT_AMBULATORY_CARE_PROVIDER_SITE_OTHER): Payer: PRIVATE HEALTH INSURANCE | Admitting: Pharmacist Clinician (PhC)/ Clinical Pharmacy Specialist

## 2014-05-10 DIAGNOSIS — Z7901 Long term (current) use of anticoagulants: Secondary | ICD-10-CM

## 2014-05-10 DIAGNOSIS — I4891 Unspecified atrial fibrillation: Secondary | ICD-10-CM

## 2014-05-10 LAB — POCT INR: INR: 2.1

## 2014-05-11 ENCOUNTER — Telehealth: Payer: Self-pay | Admitting: Family Medicine

## 2014-05-11 NOTE — Telephone Encounter (Signed)
Pt called and wanted to know if she needs to come in for an pneumonia shot. She had heard on TV that it is every 5 years. jw

## 2014-05-17 ENCOUNTER — Telehealth: Payer: Self-pay | Admitting: Family Medicine

## 2014-05-17 NOTE — Telephone Encounter (Signed)
Pt says she was told to speak with Vinnie Level to update her and her husband's medications. Says she has been trying to call her for several days.

## 2014-05-18 NOTE — Telephone Encounter (Signed)
Returned phone call to patient to review filling husbands lisinopril before January 2016.

## 2014-06-02 ENCOUNTER — Other Ambulatory Visit: Payer: Self-pay | Admitting: Family Medicine

## 2014-06-02 DIAGNOSIS — I4891 Unspecified atrial fibrillation: Secondary | ICD-10-CM

## 2014-06-02 DIAGNOSIS — K219 Gastro-esophageal reflux disease without esophagitis: Secondary | ICD-10-CM

## 2014-06-07 ENCOUNTER — Ambulatory Visit: Payer: PRIVATE HEALTH INSURANCE | Admitting: Pharmacist Clinician (PhC)/ Clinical Pharmacy Specialist

## 2014-06-07 MED ORDER — OMEPRAZOLE 20 MG PO CPDR: 20.0000 mg | DELAYED_RELEASE_CAPSULE | Freq: Every day | ORAL | Status: DC

## 2014-06-07 NOTE — Telephone Encounter (Signed)
2nd request.  Ishika Chesterfield L, RN  

## 2014-06-08 ENCOUNTER — Encounter (HOSPITAL_COMMUNITY): Payer: Self-pay | Admitting: Internal Medicine

## 2014-06-09 ENCOUNTER — Ambulatory Visit: Payer: PRIVATE HEALTH INSURANCE | Admitting: Pharmacist Clinician (PhC)/ Clinical Pharmacy Specialist

## 2014-06-12 ENCOUNTER — Ambulatory Visit (INDEPENDENT_AMBULATORY_CARE_PROVIDER_SITE_OTHER): Payer: PRIVATE HEALTH INSURANCE | Admitting: Pharmacist Clinician (PhC)/ Clinical Pharmacy Specialist

## 2014-06-12 DIAGNOSIS — I4891 Unspecified atrial fibrillation: Secondary | ICD-10-CM

## 2014-06-12 DIAGNOSIS — Z7901 Long term (current) use of anticoagulants: Secondary | ICD-10-CM

## 2014-06-12 LAB — POCT INR: INR: 3.2

## 2014-06-21 ENCOUNTER — Encounter: Payer: Self-pay | Admitting: Family Medicine

## 2014-06-21 ENCOUNTER — Ambulatory Visit: Payer: PRIVATE HEALTH INSURANCE | Admitting: Family Medicine

## 2014-06-21 ENCOUNTER — Ambulatory Visit (INDEPENDENT_AMBULATORY_CARE_PROVIDER_SITE_OTHER): Payer: PRIVATE HEALTH INSURANCE | Admitting: Family Medicine

## 2014-06-21 VITALS — BP 127/66 | HR 77 | Temp 97.9°F | Ht 67.0 in | Wt 164.6 lb

## 2014-06-21 DIAGNOSIS — E1142 Type 2 diabetes mellitus with diabetic polyneuropathy: Secondary | ICD-10-CM

## 2014-06-21 DIAGNOSIS — I4891 Unspecified atrial fibrillation: Secondary | ICD-10-CM

## 2014-06-21 DIAGNOSIS — E119 Type 2 diabetes mellitus without complications: Secondary | ICD-10-CM

## 2014-06-21 DIAGNOSIS — I10 Essential (primary) hypertension: Secondary | ICD-10-CM

## 2014-06-21 LAB — POCT GLYCOSYLATED HEMOGLOBIN (HGB A1C): Hemoglobin A1C: 6.7

## 2014-06-21 NOTE — Assessment & Plan Note (Signed)
Well controlled with A1c of 6.7 today  continue when necessary glipizide with CBG greater than 150 , very low-dose at 2.5 times. Although I would not normally get glipizide to such an elderly lady she seems to be safe and have a decreased anxiety level due to being able to take this medicine.  She would like to bring in her glucose meter for a check on next visit I stated this is probably okay.  follow-up 3 months

## 2014-06-21 NOTE — Assessment & Plan Note (Signed)
Rate controlled and a symptomatic INR slightly supratherapeutic at 3.2, has follow-up in Coumadin clinic Continue Coumadin and carvedilol

## 2014-06-21 NOTE — Patient Instructions (Signed)
Great to meet you!  Please come back in 3 months for a follow up for diabetes

## 2014-06-21 NOTE — Assessment & Plan Note (Signed)
Well-controlled No red flags Continue Lasix, Cozaar, Norvasc, Coreg Follow-up 3 months

## 2014-06-21 NOTE — Assessment & Plan Note (Signed)
Doing well on gabapentin, continue ?

## 2014-06-21 NOTE — Progress Notes (Signed)
Patient ID: [name], female   DOB: 06-28-1928, 80 y.o.   MRN: [id number]   HPI  Patient presents today for  All of diabetes and hypertension  Diabetes Taking her meds everyday, watching her carb intake on most days Does not exercise  denies any hyperglycemia  takes 1 pill of glipizide, 2.5 mg, for fasting CBG greater than 150  Hypertension No chest pain, dyspnea, palpitations, headache Has intermittent  Leg edema which after she's been standing on her feet for several hours which always resolves by morning. Taking her meds everyday   concern for calcium Patient here concerned about calcium that she takes omeprazole an calcium has been reading her pamphlet which explains that  Omeprazole may cause calcium dysregulation and taking calcium may cause problems as well. I provided reassurance.  Smoking status noted ROS: Per HPI  Objective: BP 127/66 mmHg  Pulse 77  Temp(Src) 97.9 F (36.6 C) (Oral)  Ht 5\' 7"  (1.702 m)  Wt 164 lb 9.6 oz (74.662 kg)  BMI 25.77 kg/m2 Gen: NAD, alert, cooperative with exam HEENT: NCAT CV: RRR, good S1/S2, no murmur Resp: CTABL, no wheezes, non-labored Ext: No edema, warm Neuro: Alert and oriented, No gross deficits   foot exam She declines today  Assessment and plan:  Campath-induced atrial fibrillation Rate controlled and a symptomatic INR slightly supratherapeutic at 3.2, has follow-up in Coumadin clinic Continue Coumadin and carvedilol  HYPERTENSION, BENIGN SYSTEMIC Well-controlled No red flags Continue Lasix, Cozaar, Norvasc, Coreg Follow-up 3 months   Diabetic neuropathy  Doing well on gabapentin, continue  Type 2 diabetes mellitus  Well controlled with A1c of 6.7 today  continue when necessary glipizide with CBG greater than 150 , very low-dose at 2.5 times. Although I would not normally get glipizide to such an elderly lady she seems to be safe and have a decreased anxiety level due to being able to take this medicine.  She would like to bring in her glucose meter for a check on next visit I stated this is probably okay.  follow-up 3 months   Orders Placed This Encounter  Procedures  . HgB A1c

## 2014-07-05 ENCOUNTER — Ambulatory Visit (INDEPENDENT_AMBULATORY_CARE_PROVIDER_SITE_OTHER): Payer: Medicare Other | Admitting: Pharmacist Clinician (PhC)/ Clinical Pharmacy Specialist

## 2014-07-05 DIAGNOSIS — Z7901 Long term (current) use of anticoagulants: Secondary | ICD-10-CM

## 2014-07-05 DIAGNOSIS — I4891 Unspecified atrial fibrillation: Secondary | ICD-10-CM

## 2014-07-05 LAB — POCT INR: INR: 3.5

## 2014-07-21 ENCOUNTER — Telehealth: Payer: Self-pay | Admitting: Family Medicine

## 2014-07-21 NOTE — Telephone Encounter (Signed)
Emergency Line / After Hours Call  Pt called the emergency line because she accidentally took one each of her husband's pills (metformin 1000mg  and glipizide 10mg ). Feeling well, just ate a big bowl of chocolate icecream. Has glucometer at home and knows she can check her sugar. Advised that if she begins to feel bad she should check her CBG and if less than 70, eat something sweet. If she starts to feel very bad, can come to ER for evaluation. Pt understood and appreciated these recommendations.  Chrisandra Netters, MD Family Medicine PGY-3

## 2014-07-26 ENCOUNTER — Ambulatory Visit (INDEPENDENT_AMBULATORY_CARE_PROVIDER_SITE_OTHER): Payer: Medicare Other | Admitting: Pharmacist Clinician (PhC)/ Clinical Pharmacy Specialist

## 2014-07-26 DIAGNOSIS — I4891 Unspecified atrial fibrillation: Secondary | ICD-10-CM

## 2014-07-26 DIAGNOSIS — Z7901 Long term (current) use of anticoagulants: Secondary | ICD-10-CM

## 2014-07-26 LAB — POCT INR: INR: 1.7

## 2014-08-04 ENCOUNTER — Telehealth: Payer: Self-pay | Admitting: Family Medicine

## 2014-08-04 DIAGNOSIS — E0843 Diabetes mellitus due to underlying condition with diabetic autonomic (poly)neuropathy: Secondary | ICD-10-CM

## 2014-08-04 MED ORDER — GABAPENTIN 300 MG PO CAPS: ORAL_CAPSULE | ORAL | Status: DC

## 2014-08-04 NOTE — Telephone Encounter (Signed)
Spoke with patient and she informed me that her nephrologist said it would be ok to take two 300 mg gabapentin capsules at night for diabetic neuropathy.   Medication sent to pharmacy.   Rosemarie Ax, MD PGY-2, [facility] 08/04/2014, 12:18 PM

## 2014-08-04 NOTE — Telephone Encounter (Signed)
Pt called and needs a refill on her gabapentin. Her kidney doctor said that she should take 2 of these so can we change the prescription. jw

## 2014-08-09 ENCOUNTER — Ambulatory Visit (INDEPENDENT_AMBULATORY_CARE_PROVIDER_SITE_OTHER): Payer: Medicare Other | Admitting: Pharmacist Clinician (PhC)/ Clinical Pharmacy Specialist

## 2014-08-09 DIAGNOSIS — N183 Chronic kidney disease, stage 3 (moderate): Secondary | ICD-10-CM | POA: Diagnosis not present

## 2014-08-09 DIAGNOSIS — Z7901 Long term (current) use of anticoagulants: Secondary | ICD-10-CM | POA: Diagnosis not present

## 2014-08-09 DIAGNOSIS — I4891 Unspecified atrial fibrillation: Secondary | ICD-10-CM

## 2014-08-09 DIAGNOSIS — D638 Anemia in other chronic diseases classified elsewhere: Secondary | ICD-10-CM | POA: Diagnosis not present

## 2014-08-09 LAB — POCT INR: INR: 2.4

## 2014-08-11 ENCOUNTER — Other Ambulatory Visit: Payer: Self-pay | Admitting: Family Medicine

## 2014-08-11 ENCOUNTER — Telehealth: Payer: Self-pay | Admitting: Family Medicine

## 2014-08-11 ENCOUNTER — Other Ambulatory Visit: Payer: Self-pay | Admitting: Internal Medicine

## 2014-08-11 NOTE — Telephone Encounter (Signed)
Rx(s) sent to pharmacy electronically.  

## 2014-08-11 NOTE — Telephone Encounter (Signed)
Left VM for patient. I wanted to discuss the use of omeprazole. May not want to continue this medication due to the increased risk of osteoporosis if she has been on it for a while.   Rosemarie Ax, MD PGY-2, [facility] 08/11/2014, 2:58 PM

## 2014-08-11 NOTE — Telephone Encounter (Signed)
Pt called because her prescription of Gabapentin needs to be at 180 qty for a three month supple so that her insurance will cover this. Please re-send to her pharmacy. jw

## 2014-08-14 DIAGNOSIS — J4520 Mild intermittent asthma, uncomplicated: Secondary | ICD-10-CM | POA: Diagnosis not present

## 2014-08-16 ENCOUNTER — Telehealth: Payer: Self-pay | Admitting: *Deleted

## 2014-08-16 DIAGNOSIS — K219 Gastro-esophageal reflux disease without esophagitis: Secondary | ICD-10-CM

## 2014-08-16 DIAGNOSIS — E0843 Diabetes mellitus due to underlying condition with diabetic autonomic (poly)neuropathy: Secondary | ICD-10-CM

## 2014-08-16 DIAGNOSIS — I10 Essential (primary) hypertension: Secondary | ICD-10-CM

## 2014-08-16 NOTE — Telephone Encounter (Signed)
Pt is returning Dr. Raeford Razor call. jw

## 2014-08-18 MED ORDER — AMLODIPINE BESYLATE 5 MG PO TABS: ORAL_TABLET | ORAL | Status: DC

## 2014-08-18 MED ORDER — CARVEDILOL 6.25 MG PO TABS: ORAL_TABLET | ORAL | Status: DC

## 2014-08-18 MED ORDER — GABAPENTIN 300 MG PO CAPS: ORAL_CAPSULE | ORAL | Status: DC

## 2014-08-18 MED ORDER — OMEPRAZOLE 20 MG PO CPDR: 20.0000 mg | DELAYED_RELEASE_CAPSULE | Freq: Every day | ORAL | Status: DC

## 2014-08-18 NOTE — Telephone Encounter (Signed)
Spoke with patient. She wants gabapentin called into [facility] on [address].   Gabapentin 300 mg QD.   Rosemarie Ax, MD PGY-2, [facility] 08/18/2014, 5:09 PM

## 2014-08-18 NOTE — Telephone Encounter (Signed)
Will only refill Prilosec for one month. Need to have conversation about the use of this given her age and risk of osteoporosis.   Rosemarie Ax, MD PGY-2, [facility] 08/18/2014, 5:00 PM

## 2014-08-18 NOTE — Addendum Note (Signed)
Addended by: Rosemarie Ax on: 08/18/2014 05:10 PM   Modules accepted: Orders

## 2014-08-18 NOTE — Telephone Encounter (Signed)
Kidney dr said she can only take 300 mg per day.  She needs one pill per day and a total of 90 pills

## 2014-08-31 ENCOUNTER — Telehealth: Payer: Self-pay | Admitting: Family Medicine

## 2014-08-31 DIAGNOSIS — K219 Gastro-esophageal reflux disease without esophagitis: Secondary | ICD-10-CM

## 2014-08-31 NOTE — Telephone Encounter (Signed)
Pt called because a 2-3 years ago she seen a ENT doctor but she doesn't remember who it was. Can we let her know who she seen. jw

## 2014-09-06 ENCOUNTER — Ambulatory Visit (INDEPENDENT_AMBULATORY_CARE_PROVIDER_SITE_OTHER): Payer: Medicare Other | Admitting: Pharmacist Clinician (PhC)/ Clinical Pharmacy Specialist

## 2014-09-06 DIAGNOSIS — I4891 Unspecified atrial fibrillation: Secondary | ICD-10-CM | POA: Diagnosis not present

## 2014-09-06 DIAGNOSIS — Z7901 Long term (current) use of anticoagulants: Secondary | ICD-10-CM

## 2014-09-06 LAB — POCT INR: INR: 2.2

## 2014-09-07 ENCOUNTER — Encounter: Payer: Self-pay | Admitting: Family Medicine

## 2014-09-07 ENCOUNTER — Ambulatory Visit (INDEPENDENT_AMBULATORY_CARE_PROVIDER_SITE_OTHER): Payer: Medicare Other | Admitting: Family Medicine

## 2014-09-07 VITALS — BP 138/53 | HR 76 | Temp 98.1°F | Ht 67.0 in | Wt 160.5 lb

## 2014-09-07 DIAGNOSIS — Z87898 Personal history of other specified conditions: Secondary | ICD-10-CM | POA: Diagnosis not present

## 2014-09-07 DIAGNOSIS — E1142 Type 2 diabetes mellitus with diabetic polyneuropathy: Secondary | ICD-10-CM

## 2014-09-07 DIAGNOSIS — M810 Age-related osteoporosis without current pathological fracture: Secondary | ICD-10-CM

## 2014-09-07 DIAGNOSIS — N183 Chronic kidney disease, stage 3 unspecified: Secondary | ICD-10-CM

## 2014-09-07 DIAGNOSIS — Z9189 Other specified personal risk factors, not elsewhere classified: Secondary | ICD-10-CM

## 2014-09-07 DIAGNOSIS — K219 Gastro-esophageal reflux disease without esophagitis: Secondary | ICD-10-CM | POA: Diagnosis not present

## 2014-09-07 DIAGNOSIS — E2839 Other primary ovarian failure: Secondary | ICD-10-CM | POA: Diagnosis not present

## 2014-09-07 DIAGNOSIS — I10 Essential (primary) hypertension: Secondary | ICD-10-CM

## 2014-09-07 LAB — POCT GLYCOSYLATED HEMOGLOBIN (HGB A1C): HEMOGLOBIN A1C: 6.6

## 2014-09-07 MED ORDER — RANITIDINE HCL 150 MG PO TABS: 150.0000 mg | ORAL_TABLET | Freq: Two times a day (BID) | ORAL | Status: DC

## 2014-09-07 NOTE — Progress Notes (Signed)
Subjective:    Patient ID: [name], female    DOB: [birth year], 81 y.o.   MRN: [id number]  HPI  [name] is here for f/u.  HTN Disease Monitoring: Home BP Monitoring no Chest pain- no    Dyspnea- yes but has history of bronchitis. Comes and goes  Medications: losartan, amlodipine, coreg  Compliance-  Yes . Lightheadedness-  no  Edema- no  CHRONIC DIABETES  Disease Monitoring  Blood Sugar Ranges: below 150   Polyuria: no   Visual problems: no   Medication Compliance: yes  Medication Side Effects  Hypoglycemia: no   Preventitive Health Care  Eye Exam: checked eyes last year.   Foot Exam: today   Diet pattern: 24 hr recall sandwich, eggs and toast,  Has been taking prilosec for reflux. She is unsure how long she has been taking it. High risk for fracture on falls based on FORE calculator. She takes coumadin for afib and also CKD III.   CKD III: her gabapentin dose was recently changed by her Nephrologist. She reports compliance to this new regimen.    Current Outpatient Prescriptions on File Prior to Visit  Medication Sig Dispense Refill  . albuterol (PROVENTIL HFA;VENTOLIN HFA) 108 (90 BASE) MCG/ACT inhaler Inhale 2 puffs into the lungs every 6 (six) hours as needed for wheezing or shortness of breath. 3 Inhaler 1  . albuterol (PROVENTIL) (2.5 MG/3ML) 0.083% nebulizer solution Take 3 mLs (2.5 mg total) by nebulization every 6 (six) hours as needed for wheezing or shortness of breath. 120 vial 6  . amLODipine (NORVASC) 5 MG tablet Take 1 tablet by mouth  daily 90 tablet 1  . aspirin EC 81 MG tablet Take 81 mg by mouth daily.    Marland Kitchen atorvastatin (LIPITOR) 80 MG tablet Take 1 tablet (80 mg total) by mouth at bedtime. 90 tablet 3  . Calcium Carbonate-Vitamin D (CALCIUM 600+D) 600-200 MG-UNIT TABS Take 1 tablet by mouth daily.     . carvedilol (COREG) 6.25 MG tablet Take 1 tablet by mouth  twice a day with meals 180 tablet 1  . Chromium-Cinnamon (CINNAMON PLUS  CHROMIUM PO) Take 1 capsule by mouth 2 (two) times daily. 9mcg/500mg     . CINNAMON PO Take by mouth daily.    Marland Kitchen docusate sodium (COLACE) 100 MG capsule Take 200 mg by mouth 2 (two) times daily.    . furosemide (LASIX) 20 MG tablet Take 1 tablet by mouth  daily 90 tablet 0  . gabapentin (NEURONTIN) 300 MG capsule Take one 300 mg capsule nightly. 90 capsule 1  . losartan (COZAAR) 50 MG tablet Take 1 tablet by mouth  daily 90 tablet 0  . Omega-3 Fatty Acids (FISH OIL) 1000 MG CAPS Take 1 capsule by mouth 2 (two) times daily.     Marland Kitchen omeprazole (PRILOSEC) 20 MG capsule Take 1 capsule (20 mg total) by mouth daily. 30 capsule 0  . ONE TOUCH ULTRA TEST test strip TEST twice a day FOR SUGAR 100 each 9  . Polyvinyl Alcohol-Povidone (REFRESH OP) Place 1 drop into both eyes 3 (three) times daily as needed (dry eyes).    . vitamin C (ASCORBIC ACID) 500 MG tablet Take 500 mg by mouth 2 (two) times daily.    Marland Kitchen warfarin (COUMADIN) 5 MG tablet Take 1 tablet by mouth  daily 90 tablet 3   Current Facility-Administered Medications on File Prior to Visit  Medication Dose Route Frequency Provider Last Rate Last Dose  . 0.9 %  sodium chloride infusion   Intravenous Continuous Pixie Casino, MD      . sodium chloride 0.9 % injection 3 mL  3 mL Intravenous Q12H Pixie Casino, MD        SHx: Lives wit her husband.   Health Maintenance: Needs Dexa scan    Review of Systems See HPI     Objective:   Physical Exam BP 138/53 mmHg  Pulse 76  Temp(Src) 98.1 F (36.7 C) (Oral)  Ht 5\' 7"  (1.702 m)  Wt 160 lb 8 oz (72.802 kg)  BMI 25.13 kg/m2 Gen: NAD, alert, elderly frail female.  CV: irregularly irregular, no murmurs   Resp: CTABL, no wheezes, non-labored Skin: no rashes, normal turgor  Neuro: normal sensation in feet b/l, neurovascularly intact, proprioception intact of great toes b/l       Assessment & Plan:

## 2014-09-07 NOTE — Patient Instructions (Signed)
Thank you for coming in,   Stop taking the prilosec. I will send in a medication called ranitidine for reflux.   We will schedule the bone density scan before you leave. Once I get those results, I'll let you know if there is anything we need to do.    Please feel free to call with any questions or concerns at any time, at [phone]. --Dr. Raeford Razor

## 2014-09-11 ENCOUNTER — Other Ambulatory Visit: Payer: Medicare Other

## 2014-09-11 DIAGNOSIS — M810 Age-related osteoporosis without current pathological fracture: Secondary | ICD-10-CM | POA: Insufficient documentation

## 2014-09-11 NOTE — Assessment & Plan Note (Signed)
High Risk based on FORE score. Complicated with CKD stage III and on warfarin. Stopped PPI and started ranitidine.  - ordered DEXA scan.

## 2014-09-11 NOTE — Assessment & Plan Note (Signed)
Gabapentin dose to once per day based on stage of CKD. Avoiding NSAIDS as well.

## 2014-09-11 NOTE — Assessment & Plan Note (Signed)
Controlled.  Continue current medications.

## 2014-09-11 NOTE — Assessment & Plan Note (Signed)
Well controlled.  No changes in medication.

## 2014-09-12 ENCOUNTER — Ambulatory Visit
Admission: RE | Admit: 2014-09-12 | Discharge: 2014-09-12 | Disposition: A | Discharge status: Home / Self Care | Payer: Medicare Other | Source: Ambulatory Visit | Attending: Family Medicine | Admitting: Family Medicine

## 2014-09-12 DIAGNOSIS — M353 Polymyalgia rheumatica: Secondary | ICD-10-CM | POA: Diagnosis not present

## 2014-09-12 DIAGNOSIS — S329XXA Fracture of unspecified parts of lumbosacral spine and pelvis, initial encounter for closed fracture: Secondary | ICD-10-CM | POA: Diagnosis not present

## 2014-09-12 DIAGNOSIS — M159 Polyosteoarthritis, unspecified: Secondary | ICD-10-CM | POA: Diagnosis not present

## 2014-09-12 DIAGNOSIS — E2839 Other primary ovarian failure: Secondary | ICD-10-CM

## 2014-09-12 DIAGNOSIS — M810 Age-related osteoporosis without current pathological fracture: Secondary | ICD-10-CM | POA: Diagnosis not present

## 2014-09-12 DIAGNOSIS — J4520 Mild intermittent asthma, uncomplicated: Secondary | ICD-10-CM | POA: Diagnosis not present

## 2014-09-15 ENCOUNTER — Telehealth: Payer: Self-pay | Admitting: Family Medicine

## 2014-09-15 NOTE — Telephone Encounter (Signed)
Called patient and discussed the results of her recent Dexa scan. It suggests treating. She will call today and schedule an appt on April 4 at 3:15 and we will talk about starting alendronate.   Rosemarie Ax, MD PGY-2, [facility] 09/15/2014, 11:21 AM

## 2014-09-25 ENCOUNTER — Other Ambulatory Visit: Payer: Self-pay | Admitting: *Deleted

## 2014-09-25 DIAGNOSIS — K219 Gastro-esophageal reflux disease without esophagitis: Secondary | ICD-10-CM

## 2014-09-26 MED ORDER — RANITIDINE HCL 150 MG PO TABS: 150.0000 mg | ORAL_TABLET | Freq: Two times a day (BID) | ORAL | Status: DC

## 2014-09-27 MED ORDER — RANITIDINE HCL 150 MG PO TABS: 150.0000 mg | ORAL_TABLET | Freq: Two times a day (BID) | ORAL | Status: DC

## 2014-09-27 NOTE — Telephone Encounter (Signed)
Received another request for Ranitidine.  Refill approved 09/26/2014, but stated fax.  Refill request resent electronically.  Derl Barrow, RN

## 2014-09-27 NOTE — Addendum Note (Signed)
Addended by: Derl Barrow on: 09/27/2014 11:11 AM   Modules accepted: Orders

## 2014-09-29 MED ORDER — RANITIDINE HCL 150 MG PO CAPS: 150.0000 mg | ORAL_CAPSULE | Freq: Two times a day (BID) | ORAL | Status: DC

## 2014-09-29 NOTE — Telephone Encounter (Signed)
Need to have refill on the ranitidine for 39mo supply.  She's taking 2 daily for her reflux.  Also need to have all her rxs sent to pharmacy for 46m supply because it cost less that way.  Can send everything to the [facility] on [address].

## 2014-09-29 NOTE — Telephone Encounter (Signed)
LVM for pt to call back to let her know that he sent in her medicines and also to ask her about the ENT question she had and let her know below, (message from Dr.). Katharina Caper, April D

## 2014-10-02 ENCOUNTER — Telehealth: Payer: Self-pay | Admitting: Family Medicine

## 2014-10-02 ENCOUNTER — Ambulatory Visit (INDEPENDENT_AMBULATORY_CARE_PROVIDER_SITE_OTHER): Payer: Medicare Other | Admitting: Family Medicine

## 2014-10-02 ENCOUNTER — Encounter: Payer: Self-pay | Admitting: Family Medicine

## 2014-10-02 VITALS — BP 149/50 | HR 68 | Temp 98.2°F | Ht 67.0 in | Wt 162.0 lb

## 2014-10-02 DIAGNOSIS — N2581 Secondary hyperparathyroidism of renal origin: Secondary | ICD-10-CM

## 2014-10-02 DIAGNOSIS — N183 Chronic kidney disease, stage 3 unspecified: Secondary | ICD-10-CM

## 2014-10-02 DIAGNOSIS — M810 Age-related osteoporosis without current pathological fracture: Secondary | ICD-10-CM

## 2014-10-02 LAB — COMPREHENSIVE METABOLIC PANEL
ALT: 24 U/L (ref 0–35)
AST: 14 U/L (ref 0–37)
Albumin: 4 g/dL (ref 3.5–5.2)
Alkaline Phosphatase: 61 U/L (ref 39–117)
BUN: 33 mg/dL — AB (ref 6–23)
CALCIUM: 8.7 mg/dL (ref 8.4–10.5)
CO2: 25 meq/L (ref 19–32)
Chloride: 104 mEq/L (ref 96–112)
Creat: 1.33 mg/dL — ABNORMAL HIGH (ref 0.50–1.10)
Glucose, Bld: 283 mg/dL — ABNORMAL HIGH (ref 70–99)
Potassium: 4.9 mEq/L (ref 3.5–5.3)
Sodium: 141 mEq/L (ref 135–145)
Total Bilirubin: 1 mg/dL (ref 0.2–1.2)
Total Protein: 6.1 g/dL (ref 6.0–8.3)

## 2014-10-02 LAB — PHOSPHORUS: Phosphorus: 3.9 mg/dL (ref 2.3–4.6)

## 2014-10-02 NOTE — Progress Notes (Signed)
Subjective:    Patient ID: [name], female    DOB: 08-Oct-1927, 81 y.o.   MRN: [id number]  HPI  [name] is here for dexa scan f/u.   She is here for f/u results of her Dexa scan. Her omeprazole was recently stopped and switched to ranitidine. She is followed by [facility] for her CKD. She follows up with them roughly on a yearly basis since her kidney function has improved. She has not had any fractures to date.    Current Outpatient Prescriptions on File Prior to Visit  Medication Sig Dispense Refill  . albuterol (PROVENTIL HFA;VENTOLIN HFA) 108 (90 BASE) MCG/ACT inhaler Inhale 2 puffs into the lungs every 6 (six) hours as needed for wheezing or shortness of breath. 3 Inhaler 1  . albuterol (PROVENTIL) (2.5 MG/3ML) 0.083% nebulizer solution Take 3 mLs (2.5 mg total) by nebulization every 6 (six) hours as needed for wheezing or shortness of breath. 120 vial 6  . amLODipine (NORVASC) 5 MG tablet Take 1 tablet by mouth  daily 90 tablet 1  . aspirin EC 81 MG tablet Take 81 mg by mouth daily.    Marland Kitchen atorvastatin (LIPITOR) 80 MG tablet Take 1 tablet (80 mg total) by mouth at bedtime. 90 tablet 3  . Calcium Carbonate-Vitamin D (CALCIUM 600+D) 600-200 MG-UNIT TABS Take 1 tablet by mouth daily.     . carvedilol (COREG) 6.25 MG tablet Take 1 tablet by mouth  twice a day with meals 180 tablet 1  . Chromium-Cinnamon (CINNAMON PLUS CHROMIUM PO) Take 1 capsule by mouth 2 (two) times daily. 41mcg/500mg     . CINNAMON PO Take by mouth daily.    Marland Kitchen docusate sodium (COLACE) 100 MG capsule Take 200 mg by mouth 2 (two) times daily.    . furosemide (LASIX) 20 MG tablet Take 1 tablet by mouth  daily 90 tablet 0  . gabapentin (NEURONTIN) 300 MG capsule Take one 300 mg capsule nightly. 90 capsule 1  . losartan (COZAAR) 50 MG tablet Take 1 tablet by mouth  daily 90 tablet 0  . Omega-3 Fatty Acids (FISH OIL) 1000 MG CAPS Take 1 capsule by mouth 2 (two) times daily.     Marland Kitchen omeprazole (PRILOSEC) 20 MG  capsule Take 1 capsule (20 mg total) by mouth daily. 30 capsule 0  . ONE TOUCH ULTRA TEST test strip TEST twice a day FOR SUGAR 100 each 9  . Polyvinyl Alcohol-Povidone (REFRESH OP) Place 1 drop into both eyes 3 (three) times daily as needed (dry eyes).    . ranitidine (ZANTAC) 150 MG capsule Take 1 capsule (150 mg total) by mouth 2 (two) times daily. 180 capsule 1  . vitamin C (ASCORBIC ACID) 500 MG tablet Take 500 mg by mouth 2 (two) times daily.    Marland Kitchen warfarin (COUMADIN) 5 MG tablet Take 1 tablet by mouth  daily 90 tablet 3   Current Facility-Administered Medications on File Prior to Visit  Medication Dose Route Frequency Provider Last Rate Last Dose  . 0.9 %  sodium chloride infusion   Intravenous Continuous Pixie Casino, MD      . sodium chloride 0.9 % injection 3 mL  3 mL Intravenous Q12H Pixie Casino, MD        SHx: Lives with husband   Health Maintenance: Received PNA vac at [facility]     Review of Systems See HPI     Objective:   Physical Exam BP 149/50 mmHg  Pulse  68  Temp(Src) 98.2 F (36.8 C)  Ht 5\' 7"  (1.702 m)  Wt 162 lb (73.483 kg)  BMI 25.37 kg/m2 Gen: NAD, alert, cooperative with exam, well-appearing CV: RRR, good S1/S2, no murmur, trace edema   Resp: CTABL, no wheezes, non-labored Skin: no rashes,  Neuro: no gross deficits.       Assessment & Plan:

## 2014-10-02 NOTE — Telephone Encounter (Signed)
Pt says we left her a vm to call back, needs to know if she needs to do anything before her appt today

## 2014-10-02 NOTE — Telephone Encounter (Signed)
Spoke with pt and informed her that her meds had been filled and also inquired about her ENT question she stated she went there due to wax in her ear and that they had told her to follow up in 6 mos but she was not sure which group it is.  Told her we would try and figure that out for her and let her know at her visit this afternoon with Dr. Raeford Razor. Katharina Caper, Demetria Lightsey D'

## 2014-10-02 NOTE — Patient Instructions (Signed)
Thank you for coming in,   I'll give you a call with the lab results.   I will call Dr. Lady Saucier and ask him what he thinks about starting a medication to help with your bone density.    Please feel free to call with any questions or concerns at any time, at [phone]. --Dr. Raeford Razor

## 2014-10-03 LAB — PARATHYROID HORMONE, INTACT (NO CA): PTH: 110 pg/mL — ABNORMAL HIGH (ref 14–64)

## 2014-10-03 NOTE — Assessment & Plan Note (Signed)
Starting treatment is complicated by her CKD. Plan outlined in secondary hyperparathyroidism A/P.

## 2014-10-03 NOTE — Assessment & Plan Note (Signed)
PTH 110 with normal Phos and Calcium. This is complicating her treatment of osteoporosis. eGFR >30. Will ask Dr. Marval Regal if he thinks bisphosphonates are the best initial treatment or if treatment with vitamin D supplementation would be more beneficial.

## 2014-10-04 ENCOUNTER — Telehealth: Payer: Self-pay | Admitting: Family Medicine

## 2014-10-04 NOTE — Telephone Encounter (Signed)
Discussed with [name] her lab findings and let her know that I am awaiting to hear from her kidney doctor for input in treating her osteoporosis.   Rosemarie Ax, MD PGY-2, [facility] 10/04/2014, 2:45 PM

## 2014-10-10 ENCOUNTER — Telehealth: Payer: Self-pay | Admitting: Family Medicine

## 2014-10-10 NOTE — Telephone Encounter (Signed)
Pt called and wanted to know if the doctor had faxed a letter about what her medications are to her heart doctor. If not please do. jw

## 2014-10-12 DIAGNOSIS — D638 Anemia in other chronic diseases classified elsewhere: Secondary | ICD-10-CM | POA: Diagnosis not present

## 2014-10-12 DIAGNOSIS — N183 Chronic kidney disease, stage 3 (moderate): Secondary | ICD-10-CM | POA: Diagnosis not present

## 2014-10-12 MED ORDER — CALCITRIOL 0.25 MCG PO CAPS: ORAL_CAPSULE | ORAL | Status: DC

## 2014-10-12 NOTE — Telephone Encounter (Signed)
Spoke with Dr. Marval Regal thismorning about her most recent labs and Dexa scan. He suggested calcitriol 0.25 mcg every M, W, F with 3 month monitoring of her Calcium, phosphorus and PTH.   Called Patient and informed of the new medication.   Rosemarie Ax, MD PGY-2, [facility] 10/12/2014, 8:49 AM

## 2014-10-12 NOTE — Telephone Encounter (Signed)
Spoke with patient today and answered her questions.   Rosemarie Ax, MD PGY-2, [facility] 10/12/2014, 8:30 AM

## 2014-10-12 NOTE — Telephone Encounter (Signed)
Faxed most recent labs to [facility] per Dr. Raeford Razor. Katharina Caper, Lewie Deman D

## 2014-10-13 ENCOUNTER — Telehealth: Payer: Self-pay | Admitting: Family Medicine

## 2014-10-13 DIAGNOSIS — J4520 Mild intermittent asthma, uncomplicated: Secondary | ICD-10-CM | POA: Diagnosis not present

## 2014-10-13 DIAGNOSIS — N2581 Secondary hyperparathyroidism of renal origin: Secondary | ICD-10-CM

## 2014-10-13 MED ORDER — CALCITRIOL 0.25 MCG PO CAPS: ORAL_CAPSULE | ORAL | Status: DC

## 2014-10-13 NOTE — Telephone Encounter (Signed)
Order changed to #30 per refill, which would be equivalent to a 90 day supply. Please call and let her know this was changed. -Dr. Lamar Benes

## 2014-10-13 NOTE — Telephone Encounter (Signed)
Pt is calling re: her calcitriol, says she is only getting 15 pills per month since she takes it 3 days a week but wants to know if the doctor can prescribe more pills per month so that she can get it cheaper. Pt says its more expensive to get it the way it is prescribed.

## 2014-10-16 NOTE — Telephone Encounter (Signed)
Pt informed. Alexandre Faries, CMA  

## 2014-10-17 ENCOUNTER — Ambulatory Visit: Payer: Medicare Other | Admitting: Family Medicine

## 2014-10-18 ENCOUNTER — Ambulatory Visit: Payer: Medicare Other | Admitting: Pharmacist Clinician (PhC)/ Clinical Pharmacy Specialist

## 2014-10-18 DIAGNOSIS — M25562 Pain in left knee: Secondary | ICD-10-CM | POA: Diagnosis not present

## 2014-10-18 DIAGNOSIS — M19012 Primary osteoarthritis, left shoulder: Secondary | ICD-10-CM | POA: Diagnosis not present

## 2014-10-18 DIAGNOSIS — M25512 Pain in left shoulder: Secondary | ICD-10-CM | POA: Diagnosis not present

## 2014-10-20 ENCOUNTER — Ambulatory Visit (INDEPENDENT_AMBULATORY_CARE_PROVIDER_SITE_OTHER): Payer: Medicare Other | Admitting: Pharmacist Clinician (PhC)/ Clinical Pharmacy Specialist

## 2014-10-20 DIAGNOSIS — I4891 Unspecified atrial fibrillation: Secondary | ICD-10-CM | POA: Diagnosis not present

## 2014-10-20 DIAGNOSIS — Z7901 Long term (current) use of anticoagulants: Secondary | ICD-10-CM | POA: Diagnosis not present

## 2014-10-20 LAB — POCT INR: INR: 4.1

## 2014-10-31 ENCOUNTER — Telehealth: Payer: Self-pay | Admitting: Family Medicine

## 2014-10-31 NOTE — Telephone Encounter (Signed)
Needs referral to Dr Minna Merritts  [facility] Fax [phone]

## 2014-11-03 ENCOUNTER — Ambulatory Visit (INDEPENDENT_AMBULATORY_CARE_PROVIDER_SITE_OTHER): Payer: Medicare Other | Admitting: Pharmacist Clinician (PhC)/ Clinical Pharmacy Specialist

## 2014-11-03 DIAGNOSIS — I4891 Unspecified atrial fibrillation: Secondary | ICD-10-CM

## 2014-11-03 DIAGNOSIS — Z7901 Long term (current) use of anticoagulants: Secondary | ICD-10-CM

## 2014-11-03 LAB — POCT INR: INR: 2.2

## 2014-11-03 NOTE — Telephone Encounter (Signed)
Spoke with patient about request for referral. She is stating that she has ear pain. She has not seen Dr. Ernesto Rutherford before for this reason. She will schedule an appt with me to be seen and determine if a referral is necessary.   Rosemarie Ax, MD PGY-2, [facility] 11/03/2014, 12:35 PM

## 2014-11-12 DIAGNOSIS — J4520 Mild intermittent asthma, uncomplicated: Secondary | ICD-10-CM | POA: Diagnosis not present

## 2014-11-13 ENCOUNTER — Telehealth: Payer: Self-pay | Admitting: Family Medicine

## 2014-11-13 NOTE — Telephone Encounter (Signed)
Will forward to MD to send in a new rx. Jazmin Hartsell,CMA

## 2014-11-13 NOTE — Telephone Encounter (Signed)
Needs to let pharmacy know it is ok for her to take something similar to Warfarin. It seems that the mfg has changed. Her pharmacy is [facility]  [phone]  -not sure if this is the fax or phone number

## 2014-11-16 ENCOUNTER — Ambulatory Visit: Payer: Medicare Other | Admitting: Family Medicine

## 2014-11-17 ENCOUNTER — Ambulatory Visit (INDEPENDENT_AMBULATORY_CARE_PROVIDER_SITE_OTHER): Payer: Medicare Other | Admitting: Family Medicine

## 2014-11-17 ENCOUNTER — Encounter: Payer: Self-pay | Admitting: Family Medicine

## 2014-11-17 VITALS — BP 106/62 | HR 70 | Temp 97.6°F | Ht 67.0 in | Wt 158.0 lb

## 2014-11-17 DIAGNOSIS — R739 Hyperglycemia, unspecified: Secondary | ICD-10-CM

## 2014-11-17 DIAGNOSIS — E118 Type 2 diabetes mellitus with unspecified complications: Secondary | ICD-10-CM

## 2014-11-17 DIAGNOSIS — Z7901 Long term (current) use of anticoagulants: Secondary | ICD-10-CM | POA: Diagnosis not present

## 2014-11-17 DIAGNOSIS — E785 Hyperlipidemia, unspecified: Secondary | ICD-10-CM | POA: Diagnosis not present

## 2014-11-17 DIAGNOSIS — I959 Hypotension, unspecified: Secondary | ICD-10-CM

## 2014-11-17 LAB — LIPID PANEL
CHOLESTEROL: 151 mg/dL (ref 0–200)
HDL: 51 mg/dL (ref >=46)
LDL Cholesterol: 68 mg/dL (ref 0–99)
TRIGLYCERIDES: 158 mg/dL — AB (ref <150)
Total CHOL/HDL Ratio: 3 Ratio
VLDL: 32 mg/dL (ref 0–40)

## 2014-11-17 LAB — GLUCOSE, CAPILLARY: Glucose-Capillary: 108 mg/dL — ABNORMAL HIGH (ref 65–99)

## 2014-11-17 NOTE — Patient Instructions (Signed)
Thank you for coming in,   Let's hold your Amlodipine (Norvasc). I will have you stop taking it for now.   I will also have you hold your lasix (Furosemide) for two days.   I will call you with the lab results.   I will also want you to come to clinic and schedule a nurse visit to have your blood pressure checked or go to a pharmacy to check your blood pressure and call to let me know what it was.   Please bring all of your medications with you to each visit.    Please feel free to call with any questions or concerns at any time, at [phone]. --Dr. Raeford Razor

## 2014-11-17 NOTE — Assessment & Plan Note (Signed)
Blood sugar normal here. Not concerned if her blood sugars are in the 200's.  - f/u in one month for Hgb A1c check.

## 2014-11-17 NOTE — Assessment & Plan Note (Signed)
Taking statin. Will get lipid panel today. Normal LFT's a month ago.

## 2014-11-17 NOTE — Progress Notes (Signed)
Subjective:    Patient ID: [name], female    DOB: Jan 02, 1928, 81 y.o.   MRN: [id number]  HPI  [name] is here for Dm2.  DM2: she hasn't been taking her diabetic medications since she was having problems with her blood sugar going. She has an appt for her eye exam. Her blood sugar was over 200 a couple of times.    Anticoagulation: Hx of Afib. She has been on it for a few years.  She goes to cardiology clinic to check her INR.   HLD: she needs her cholesterol as it hasn't been checked in sometime.  She takes lipitor. Her liver enzymes were checked in April and were normal.   Low BP: asymptomatic today. She is taking amlodipine, lasix and losartan. She has been compliant with her medications. Drinking normally and voiding normally. Reports no lightheadedness or leg edema.   Current Outpatient Prescriptions on File Prior to Visit  Medication Sig Dispense Refill  . albuterol (PROVENTIL HFA;VENTOLIN HFA) 108 (90 BASE) MCG/ACT inhaler Inhale 2 puffs into the lungs every 6 (six) hours as needed for wheezing or shortness of breath. 3 Inhaler 1  . albuterol (PROVENTIL) (2.5 MG/3ML) 0.083% nebulizer solution Take 3 mLs (2.5 mg total) by nebulization every 6 (six) hours as needed for wheezing or shortness of breath. 120 vial 6  . amLODipine (NORVASC) 5 MG tablet Take 1 tablet by mouth  daily 90 tablet 1  . aspirin EC 81 MG tablet Take 81 mg by mouth daily.    Marland Kitchen atorvastatin (LIPITOR) 80 MG tablet Take 1 tablet (80 mg total) by mouth at bedtime. 90 tablet 3  . calcitRIOL (ROCALTROL) 0.25 MCG capsule Take one capsule every Monday, Wednesday and Friday. 30 capsule 2  . Calcium Carbonate-Vitamin D (CALCIUM 600+D) 600-200 MG-UNIT TABS Take 1 tablet by mouth daily.     . carvedilol (COREG) 6.25 MG tablet Take 1 tablet by mouth  twice a day with meals 180 tablet 1  . Chromium-Cinnamon (CINNAMON PLUS CHROMIUM PO) Take 1 capsule by mouth 2 (two) times daily. 25mcg/500mg     . CINNAMON PO Take  by mouth daily.    Marland Kitchen docusate sodium (COLACE) 100 MG capsule Take 200 mg by mouth 2 (two) times daily.    . furosemide (LASIX) 20 MG tablet Take 1 tablet by mouth  daily 90 tablet 0  . gabapentin (NEURONTIN) 300 MG capsule Take one 300 mg capsule nightly. 90 capsule 1  . losartan (COZAAR) 50 MG tablet Take 1 tablet by mouth  daily 90 tablet 0  . Omega-3 Fatty Acids (FISH OIL) 1000 MG CAPS Take 1 capsule by mouth 2 (two) times daily.     Marland Kitchen omeprazole (PRILOSEC) 20 MG capsule Take 1 capsule (20 mg total) by mouth daily. 30 capsule 0  . ONE TOUCH ULTRA TEST test strip TEST twice a day FOR SUGAR 100 each 9  . Polyvinyl Alcohol-Povidone (REFRESH OP) Place 1 drop into both eyes 3 (three) times daily as needed (dry eyes).    . ranitidine (ZANTAC) 150 MG capsule Take 1 capsule (150 mg total) by mouth 2 (two) times daily. 180 capsule 1  . vitamin C (ASCORBIC ACID) 500 MG tablet Take 500 mg by mouth 2 (two) times daily.    Marland Kitchen warfarin (COUMADIN) 5 MG tablet Take 1 tablet by mouth  daily 90 tablet 3   Current Facility-Administered Medications on File Prior to Visit  Medication Dose Route Frequency Provider Last Rate Last  Dose  . 0.9 %  sodium chloride infusion   Intravenous Continuous Pixie Casino, MD      . sodium chloride 0.9 % injection 3 mL  3 mL Intravenous Q12H Pixie Casino, MD        SHx: Lives with her husband.   Health Maintenance: Up to date.   Review of Systems See HPI     Objective:   Physical Exam BP 81/46 mmHg  Pulse 70  Temp(Src) 97.6 F (36.4 C) (Oral)  Ht 5\' 7"  (1.702 m)  Wt 158 lb (71.668 kg)  BMI 24.74 kg/m2 Gen: NAD, alert, cooperative with exam, well-appearing CV: Regular rate, irregularly irregular rhythm, good S1/S2, no murmur, no edema,  Resp: CTABL, no wheezes, non-labored Skin: no rashes, normal turgor  Neuro: no gross deficits.   Blood pressure recheck 106/62      Assessment & Plan:

## 2014-11-17 NOTE — Assessment & Plan Note (Signed)
Patient is asymptomatic today. Reports compliance with her medications. No vomiting or diarrhea.  Thought to be 2/2 medication  - will stop the amlodipine for now.  - will hold the lasix for 2 days.  - Encouraged to get her bp checked at a pharmacy or here in a nurse visit.  - I will call her back with her labs in a few days and ask what her bp is.

## 2014-11-17 NOTE — Assessment & Plan Note (Signed)
The manufacturer is changing that makes her warfarin. I have sent in a sheet regarding this already.  She will call back if she needs to me to call about this.

## 2014-11-20 ENCOUNTER — Telehealth: Payer: Self-pay | Admitting: Family Medicine

## 2014-11-20 NOTE — Telephone Encounter (Signed)
Called patient.  She denies LE swelling, dizziness, CP or SOB. She restarted her diuretic yesterday. Advised her restart Norvasc given her BP have been elevated. Also asked her to check her BPs twice a day and call the clinic with her numbers this Thursday/friday.

## 2014-11-20 NOTE — Telephone Encounter (Signed)
[name] is calling to report her blood pressure and pulse for the weekend: Saturday: 167/78 pulse: 77; Sunday: 164/79 pulse:77; Monday: 170/65 pulse: 78. Sadie Reynolds, ASA

## 2014-11-20 NOTE — Telephone Encounter (Signed)
Will forward to MD. Jazmin Hartsell,CMA  

## 2014-11-23 NOTE — Telephone Encounter (Signed)
BP remains elevated but i'm hesitant to increase medications in elderly with history of dizziness. Will have her follow-up with PCP to verify her home BP readings. If BP remains elevated would conider ambulatory BP monitoring for better assessment vs adding medication and monitoring for symptoms of hypotension.

## 2014-11-23 NOTE — Telephone Encounter (Signed)
[name] wanted to give the stats on her bp levels  Sat at 12:45 pm 167/78  HR 77  Sun at 4:30 pm  164/79  HR 77  Mon at 8:30 pm  170/77  HR 78  Tues at 9:30 am  165/74  HR 77- took medication early that am  Wed at 9:15 am  138/63  HR 78  Thurs at 7:00 am  178/79  HR 75  She is concerned about the readings and wanted to ask if she could get another pill to help with getting the numbers down.  We discussed what she's eating and she said she was going to try to eat more healthier (LOL).

## 2014-11-24 NOTE — Telephone Encounter (Signed)
Spoke with pt and she understands not increasing meds at this time. She is not having Chest pain and SOB at this time.  She will continue to check her BPs and bring them to her appt on 12/22/14.  If she starts to have CP or SOB she will call here or go to the ED immediately. [name], Salome [name]

## 2014-11-27 ENCOUNTER — Telehealth: Payer: Self-pay | Admitting: Internal Medicine

## 2014-11-27 DIAGNOSIS — K5900 Constipation, unspecified: Secondary | ICD-10-CM | POA: Insufficient documentation

## 2014-11-27 MED ORDER — SENNOSIDES-DOCUSATE SODIUM 8.6-50 MG PO TABS: 1.0000 | ORAL_TABLET | Freq: Two times a day (BID) | ORAL | Status: DC | PRN

## 2014-11-27 NOTE — Telephone Encounter (Signed)
IM resident paged stating after hours call was routed to them. Pt was complaining of constipation unrelieved by colace, and informed patient they would send in senna before realizing she was a [facility] patient. I agree with this as a course of action and sent in rx for senna.   Tawanna Sat, MD 11/27/2014, 2:39 PM PGY-2, [place]

## 2014-11-27 NOTE — Telephone Encounter (Signed)
Patient calling about hard stools today, took colace but didn't help very much. Has very scant blood w/ stool. Discussed prescribing a different stool softener, however, after the phone conversation, realized she is a family medicine patient. Discussed w/ Family Medicine, will manage prescription.   Natasha Bence, MD PGY-2, Internal Medicine Pager: [phone]

## 2014-11-30 ENCOUNTER — Encounter: Payer: Self-pay | Admitting: Family Medicine

## 2014-12-01 ENCOUNTER — Ambulatory Visit: Payer: Medicare Other | Admitting: Pharmacist Clinician (PhC)/ Clinical Pharmacy Specialist

## 2014-12-01 ENCOUNTER — Ambulatory Visit (INDEPENDENT_AMBULATORY_CARE_PROVIDER_SITE_OTHER): Payer: Medicare Other | Admitting: Pharmacist Clinician (PhC)/ Clinical Pharmacy Specialist

## 2014-12-01 DIAGNOSIS — Z7901 Long term (current) use of anticoagulants: Secondary | ICD-10-CM

## 2014-12-01 DIAGNOSIS — I4891 Unspecified atrial fibrillation: Secondary | ICD-10-CM | POA: Diagnosis not present

## 2014-12-01 LAB — POCT INR: INR: 3

## 2014-12-07 ENCOUNTER — Telehealth: Payer: Self-pay | Admitting: Family Medicine

## 2014-12-07 NOTE — Telephone Encounter (Signed)
Pt called because her BP is all over the place up and down. She is taking her medication but thinks she needs additional medication to help control her BP. Please call to discuss with the patient. jw

## 2014-12-08 DIAGNOSIS — H18413 Arcus senilis, bilateral: Secondary | ICD-10-CM | POA: Diagnosis not present

## 2014-12-08 DIAGNOSIS — H02839 Dermatochalasis of unspecified eye, unspecified eyelid: Secondary | ICD-10-CM | POA: Diagnosis not present

## 2014-12-08 DIAGNOSIS — E119 Type 2 diabetes mellitus without complications: Secondary | ICD-10-CM | POA: Diagnosis not present

## 2014-12-08 DIAGNOSIS — I10 Essential (primary) hypertension: Secondary | ICD-10-CM | POA: Diagnosis not present

## 2014-12-08 NOTE — Telephone Encounter (Signed)
She reports that her blood pressure has been up and down. She was restarted on her amlodipine and continued taking losartan. She will bring her bp cuff and compare to the one here when she comes to her appt on Monday.   Rosemarie Ax, MD PGY-2, [facility] 12/08/2014, 3:29 PM

## 2014-12-11 ENCOUNTER — Ambulatory Visit (INDEPENDENT_AMBULATORY_CARE_PROVIDER_SITE_OTHER): Payer: Medicare Other | Admitting: *Deleted

## 2014-12-11 ENCOUNTER — Other Ambulatory Visit (INDEPENDENT_AMBULATORY_CARE_PROVIDER_SITE_OTHER): Payer: Medicare Other

## 2014-12-11 VITALS — BP 150/70 | HR 78

## 2014-12-11 DIAGNOSIS — E1142 Type 2 diabetes mellitus with diabetic polyneuropathy: Secondary | ICD-10-CM | POA: Diagnosis not present

## 2014-12-11 DIAGNOSIS — Z136 Encounter for screening for cardiovascular disorders: Secondary | ICD-10-CM

## 2014-12-11 DIAGNOSIS — Z0130 Encounter for examination of blood pressure without abnormal findings: Secondary | ICD-10-CM

## 2014-12-11 LAB — GLUCOSE, CAPILLARY: GLUCOSE-CAPILLARY: 107 mg/dL — AB (ref 65–99)

## 2014-12-11 NOTE — Progress Notes (Signed)
   Pt in nurse clinic for blood pressure check to compare her monitor to manual reading.  BP manually 150/70- right arm, heart rate 78.  BP taken with patient monitor left arm 157/61, heart 73 and right arm 143/68, heart rate 74.  Will forward to PCP.  Derl Barrow, RN

## 2014-12-11 NOTE — Progress Notes (Signed)
Checked and compared our glucometer with patient's per Dr Raeford Razor request. Patient's read 166 compared to our 107.[place]ick, [name]

## 2014-12-13 DIAGNOSIS — J4520 Mild intermittent asthma, uncomplicated: Secondary | ICD-10-CM | POA: Diagnosis not present

## 2014-12-14 ENCOUNTER — Telehealth: Payer: Self-pay | Admitting: Family Medicine

## 2014-12-14 NOTE — Telephone Encounter (Signed)
Pt called because she needs to speak to a nurse about her blood pressure. She said her reading are all over the place. Some days it is high and other days it very low. She would like to know what the range should be. jw

## 2014-12-15 NOTE — Telephone Encounter (Signed)
Spoke with pt, advised that bp should be under 140/90.  She will write down BPs and bring them with her to fridays appt.  She will go to ED immediately if CP, SOB etc. Teena Mangus, Salome [name]

## 2014-12-22 ENCOUNTER — Encounter: Payer: Self-pay | Admitting: Family Medicine

## 2014-12-22 ENCOUNTER — Ambulatory Visit (INDEPENDENT_AMBULATORY_CARE_PROVIDER_SITE_OTHER): Payer: Medicare Other | Admitting: Family Medicine

## 2014-12-22 VITALS — BP 118/47 | HR 76 | Temp 98.0°F | Wt 156.0 lb

## 2014-12-22 DIAGNOSIS — I10 Essential (primary) hypertension: Secondary | ICD-10-CM | POA: Diagnosis not present

## 2014-12-22 DIAGNOSIS — N2581 Secondary hyperparathyroidism of renal origin: Secondary | ICD-10-CM | POA: Diagnosis not present

## 2014-12-22 DIAGNOSIS — N183 Chronic kidney disease, stage 3 (moderate): Secondary | ICD-10-CM | POA: Diagnosis not present

## 2014-12-22 DIAGNOSIS — D638 Anemia in other chronic diseases classified elsewhere: Secondary | ICD-10-CM | POA: Diagnosis not present

## 2014-12-22 NOTE — Progress Notes (Signed)
Subjective:    Patient ID: [name], female    DOB: [birth year], 81 y.o.   MRN: [id number]  HPI  [name] is here for f/u.   HTN She has her home BP measurement and it was tested in nurse's clinic. Today the measurement is off. He bp cuff is reading 157/62 and our machine was measuring 118/47.  Disease Monitoring: Home BP Monitoring: yes  Chest pain- no    Dyspnea- no Medications:losartan Compliance-  Yes . Lightheadedness-  no  Edema- no   Current Outpatient Prescriptions on File Prior to Visit  Medication Sig Dispense Refill  . albuterol (PROVENTIL HFA;VENTOLIN HFA) 108 (90 BASE) MCG/ACT inhaler Inhale 2 puffs into the lungs every 6 (six) hours as needed for wheezing or shortness of breath. 3 Inhaler 1  . albuterol (PROVENTIL) (2.5 MG/3ML) 0.083% nebulizer solution Take 3 mLs (2.5 mg total) by nebulization every 6 (six) hours as needed for wheezing or shortness of breath. 120 vial 6  . amLODipine (NORVASC) 5 MG tablet Take 1 tablet by mouth  daily 90 tablet 1  . aspirin EC 81 MG tablet Take 81 mg by mouth daily.    Marland Kitchen atorvastatin (LIPITOR) 80 MG tablet Take 1 tablet (80 mg total) by mouth at bedtime. 90 tablet 3  . calcitRIOL (ROCALTROL) 0.25 MCG capsule Take one capsule every Monday, Wednesday and Friday. 30 capsule 2  . Calcium Carbonate-Vitamin D (CALCIUM 600+D) 600-200 MG-UNIT TABS Take 1 tablet by mouth daily.     . carvedilol (COREG) 6.25 MG tablet Take 1 tablet by mouth  twice a day with meals 180 tablet 1  . Chromium-Cinnamon (CINNAMON PLUS CHROMIUM PO) Take 1 capsule by mouth 2 (two) times daily. 73mcg/500mg     . CINNAMON PO Take by mouth daily.    Marland Kitchen docusate sodium (COLACE) 100 MG capsule Take 200 mg by mouth 2 (two) times daily.    . furosemide (LASIX) 20 MG tablet Take 1 tablet by mouth  daily 90 tablet 0  . gabapentin (NEURONTIN) 300 MG capsule Take one 300 mg capsule nightly. 90 capsule 1  . losartan (COZAAR) 50 MG tablet Take 1 tablet by mouth  daily 90  tablet 0  . Omega-3 Fatty Acids (FISH OIL) 1000 MG CAPS Take 1 capsule by mouth 2 (two) times daily.     Marland Kitchen omeprazole (PRILOSEC) 20 MG capsule Take 1 capsule (20 mg total) by mouth daily. 30 capsule 0  . ONE TOUCH ULTRA TEST test strip TEST twice a day FOR SUGAR 100 each 9  . Polyvinyl Alcohol-Povidone (REFRESH OP) Place 1 drop into both eyes 3 (three) times daily as needed (dry eyes).    . ranitidine (ZANTAC) 150 MG capsule Take 1 capsule (150 mg total) by mouth 2 (two) times daily. 180 capsule 1  . senna-docusate (SENOKOT-S) 8.6-50 MG per tablet Take 1 tablet by mouth 2 (two) times daily as needed for mild constipation. 60 tablet 0  . vitamin C (ASCORBIC ACID) 500 MG tablet Take 500 mg by mouth 2 (two) times daily.    Marland Kitchen warfarin (COUMADIN) 5 MG tablet Take 1 tablet by mouth  daily 90 tablet 3   Current Facility-Administered Medications on File Prior to Visit  Medication Dose Route Frequency Provider Last Rate Last Dose  . 0.9 %  sodium chloride infusion   Intravenous Continuous Pixie Casino, MD      . sodium chloride 0.9 % injection 3 mL  3 mL Intravenous Q12H Nadean Corwin  Hilty, MD        Review of Systems See HPI     Objective:   Physical Exam BP 118/47 mmHg  Pulse 76  Temp(Src) 98 F (36.7 C) (Oral)  Wt 156 lb (70.761 kg) Gen: NAD, alert, cooperative with exam, well-appearing CV: irregularly irregular, regular rate, good S1/S2, no murmur, no edema, capillary refill brisk  Resp: CTABL, no wheezes, non-labored  Manual BP: 118/50     Assessment & Plan:

## 2014-12-25 ENCOUNTER — Encounter: Payer: Self-pay | Admitting: Family Medicine

## 2014-12-25 NOTE — Assessment & Plan Note (Signed)
Controlled. There seem to be some inconsistencies with her home bp monitoring compared to the machines in clinic.  I also took a manual bp and recorded the same reading as the machines in our clinic.  She will try to obtain a different home bp monitoring.  - will continue current regimen for now.

## 2014-12-27 DIAGNOSIS — E1129 Type 2 diabetes mellitus with other diabetic kidney complication: Secondary | ICD-10-CM | POA: Diagnosis not present

## 2014-12-27 DIAGNOSIS — I129 Hypertensive chronic kidney disease with stage 1 through stage 4 chronic kidney disease, or unspecified chronic kidney disease: Secondary | ICD-10-CM | POA: Diagnosis not present

## 2014-12-27 DIAGNOSIS — N183 Chronic kidney disease, stage 3 (moderate): Secondary | ICD-10-CM | POA: Diagnosis not present

## 2014-12-27 DIAGNOSIS — E1140 Type 2 diabetes mellitus with diabetic neuropathy, unspecified: Secondary | ICD-10-CM | POA: Diagnosis not present

## 2014-12-29 ENCOUNTER — Ambulatory Visit (INDEPENDENT_AMBULATORY_CARE_PROVIDER_SITE_OTHER): Payer: Medicare Other | Admitting: Pharmacist Clinician (PhC)/ Clinical Pharmacy Specialist

## 2014-12-29 DIAGNOSIS — Z7901 Long term (current) use of anticoagulants: Secondary | ICD-10-CM | POA: Diagnosis not present

## 2014-12-29 DIAGNOSIS — I4891 Unspecified atrial fibrillation: Secondary | ICD-10-CM | POA: Diagnosis not present

## 2014-12-29 LAB — POCT INR: INR: 2.8

## 2015-01-04 ENCOUNTER — Other Ambulatory Visit: Payer: Self-pay | Admitting: Family Medicine

## 2015-01-04 MED ORDER — ATORVASTATIN CALCIUM 80 MG PO TABS: 80.0000 mg | ORAL_TABLET | Freq: Every day | ORAL | Status: DC

## 2015-01-04 NOTE — Telephone Encounter (Signed)
Pt called and needs a refill on her Lipitor called in to her local pharmacy. [facility] on [address]

## 2015-01-11 ENCOUNTER — Telehealth: Payer: Self-pay | Admitting: *Deleted

## 2015-01-11 NOTE — Telephone Encounter (Signed)
Medical Clearance for Dental Services from [facility]  1. Type of surgery: unspecified - "our mutual patient has chosen to proceed with their dental care in our office." 2. Date of surgery: unspecified 3. Surgeon: unspecified  4. Medications that need to be held & how long: per clearance note, "patient reports being taken off of coumadin: please complete [form]" 5. Fax and/or Phone: (p): [phone] (f) [phone]  Questions: 1. Does this patient require pre-medication prior to dental treatment? 2. Has the patient been prescribed a blood thinner?  What type?  May this patient undergo general dentistry while taking this medications?  How many days does patient need to be off blood thinner prior to extractions?  How many days does patient need to be off blood thinner prior to deep cleaning procedures?  Routed to Dr. Marily Lente, PharmD

## 2015-01-11 NOTE — Telephone Encounter (Signed)
Faxed clearance however fax failed, so faxed it again electronically - 7/14 @ 1:50 pm.  Attempted to call office again to confirm receipt of fax however no answer and unable to leave VM.

## 2015-01-11 NOTE — Telephone Encounter (Signed)
Patient requesting office to fax letter to her dentist re: Coumadin status, as she has an appointment for a cleaning tomorrow and they need to receive confirmation that Coumadin does not need to be held for cleanings. Told patient we would call them now and fax letter over to them. Patient verbalized appreciation.    Attempted to call Dentist office. No answer and unable to leave voicemail. Will try again in a few minutes. Will fax note over with Dr. Lysbeth Penner directives noted below.

## 2015-01-11 NOTE — Telephone Encounter (Signed)
Do not need to hold coumadin, except for dental extraction / root canals (hold 5 days prior to this). Cleaning and "Deep cleaning" are okay to do without holding warfarin.  Dr. Lemmie Evens

## 2015-01-11 NOTE — Telephone Encounter (Signed)
lease call patient regarding a letter to her dentist stating she does not have to stop her Coumadin before her deep cleaning tomorrow.  She states she has spoken with someone about this before.

## 2015-01-12 DIAGNOSIS — J4520 Mild intermittent asthma, uncomplicated: Secondary | ICD-10-CM | POA: Diagnosis not present

## 2015-01-16 ENCOUNTER — Encounter: Payer: Self-pay | Admitting: Internal Medicine

## 2015-01-16 ENCOUNTER — Encounter: Payer: Self-pay | Admitting: *Deleted

## 2015-01-16 NOTE — Telephone Encounter (Signed)
Faxed letter with info requested to dental office via EPIC and traditional fax to [phone]

## 2015-01-17 ENCOUNTER — Other Ambulatory Visit: Payer: Self-pay | Admitting: Family Medicine

## 2015-01-18 ENCOUNTER — Telehealth: Payer: Self-pay | Admitting: Family Medicine

## 2015-01-18 NOTE — Telephone Encounter (Signed)
Pt called and needs the solution for her nebulizer called in. jw

## 2015-01-23 ENCOUNTER — Other Ambulatory Visit: Payer: Self-pay | Admitting: Family Medicine

## 2015-01-23 ENCOUNTER — Other Ambulatory Visit: Payer: Self-pay | Admitting: Internal Medicine

## 2015-01-29 ENCOUNTER — Other Ambulatory Visit: Payer: Self-pay | Admitting: Family Medicine

## 2015-01-29 DIAGNOSIS — J209 Acute bronchitis, unspecified: Secondary | ICD-10-CM

## 2015-01-29 MED ORDER — ALBUTEROL SULFATE (2.5 MG/3ML) 0.083% IN NEBU: 2.5000 mg | INHALATION_SOLUTION | Freq: Four times a day (QID) | RESPIRATORY_TRACT | Status: DC | PRN

## 2015-01-29 NOTE — Telephone Encounter (Signed)
Needs refill on albuterol solution, Was only given one refill but had always been given 3 [facility]

## 2015-02-02 ENCOUNTER — Other Ambulatory Visit: Payer: Self-pay | Admitting: Family Medicine

## 2015-02-02 MED ORDER — ALBUTEROL SULFATE HFA 108 (90 BASE) MCG/ACT IN AERS: 2.0000 | INHALATION_SPRAY | Freq: Four times a day (QID) | RESPIRATORY_TRACT | Status: DC | PRN

## 2015-02-02 NOTE — Telephone Encounter (Signed)
Needs refill on albuterol sent to [facility] on [address]. She states that she usually gets a 3 mo. Supply. Please contact pt. Once complete. Sadie Reynolds, ASA

## 2015-02-02 NOTE — Telephone Encounter (Signed)
Pt calling again and would like to have this today, as the weekend is approaching and it is hard for her to breathe. Thank you, Fonda Kinder, ASA

## 2015-02-05 ENCOUNTER — Telehealth: Payer: Self-pay | Admitting: *Deleted

## 2015-02-05 NOTE — Telephone Encounter (Signed)
Prior Authorization received from [facility] pharmacy for Albuterol neb solution. PA completed and approved until 06/30/15 Medicare part B.Reference number: [id number]. Derl Barrow, RN

## 2015-02-06 ENCOUNTER — Telehealth: Payer: Self-pay | Admitting: Family Medicine

## 2015-02-06 DIAGNOSIS — K219 Gastro-esophageal reflux disease without esophagitis: Secondary | ICD-10-CM

## 2015-02-06 DIAGNOSIS — N2581 Secondary hyperparathyroidism of renal origin: Secondary | ICD-10-CM

## 2015-02-06 NOTE — Telephone Encounter (Signed)
Pt called because the albuterol we received PA for was sent to [facility] on the 5th they still didn't receive. The patient is out and really would like [place] to call or re-fax this to them so she can get it today. jw

## 2015-02-06 NOTE — Telephone Encounter (Signed)
Patient is also requesting 3 month supply of the albuterol. She has to pay the same amount.

## 2015-02-08 NOTE — Telephone Encounter (Signed)
Need to also have these refills sent to [facility] on [address].    Warfarin Amlodipine Carvedilol Calcitriol Furosemide Losartan Atorvastatin Ranitidine  Still waiting for her refill also for the albuterol.  Need to get 50mos supply on the box of vials.  Cost less to get it that way.

## 2015-02-09 ENCOUNTER — Ambulatory Visit: Payer: Medicare Other | Admitting: Pharmacist Clinician (PhC)/ Clinical Pharmacy Specialist

## 2015-02-12 ENCOUNTER — Ambulatory Visit (INDEPENDENT_AMBULATORY_CARE_PROVIDER_SITE_OTHER): Payer: Medicare Other | Admitting: Pharmacist Clinician (PhC)/ Clinical Pharmacy Specialist

## 2015-02-12 ENCOUNTER — Ambulatory Visit: Payer: Medicare Other | Admitting: Pharmacist Clinician (PhC)/ Clinical Pharmacy Specialist

## 2015-02-12 DIAGNOSIS — Z7901 Long term (current) use of anticoagulants: Secondary | ICD-10-CM | POA: Diagnosis not present

## 2015-02-12 DIAGNOSIS — J4520 Mild intermittent asthma, uncomplicated: Secondary | ICD-10-CM | POA: Diagnosis not present

## 2015-02-12 DIAGNOSIS — I4891 Unspecified atrial fibrillation: Secondary | ICD-10-CM

## 2015-02-12 LAB — POCT INR: INR: 1.6

## 2015-02-12 NOTE — Telephone Encounter (Signed)
Left VM for patient. If she calls back please have her speak with a nurse/CMA and ask why she needs refills of these medications. They were just filled at the end of July for three month supplies.   If any questions then please take the best time and phone number to call and I will try to call her back.   Rosemarie Ax, MD PGY-3, [facility] 02/12/2015, 8:19 PM

## 2015-02-13 ENCOUNTER — Telehealth: Payer: Self-pay | Admitting: Family Medicine

## 2015-02-13 NOTE — Telephone Encounter (Signed)
Spoke to pt. Patient states that  All meds have been moved to [facility] on [address]. The only one now she needs is the Albuterol.  [facility] has never received the Rx. Ottis Stain, CMA

## 2015-02-13 NOTE — Telephone Encounter (Signed)
Needs glucometer. Says it is accustrip---maybe. Uses [facility] on [address] Please advise

## 2015-02-13 NOTE — Telephone Encounter (Signed)
Pt is returning Dr. Raeford Razor call. She said the reason she needs refills is that her insurance for her medication changed and she can not get them for the original pharmacy so new prescriptions have to be called in to [facility] on [address]. jw

## 2015-02-14 MED ORDER — ALBUTEROL SULFATE HFA 108 (90 BASE) MCG/ACT IN AERS
2.0000 | INHALATION_SPRAY | Freq: Four times a day (QID) | RESPIRATORY_TRACT | Status: DC | PRN
Start: 2015-02-14 — End: 2015-08-06

## 2015-02-14 MED ORDER — ALBUTEROL SULFATE (2.5 MG/3ML) 0.083% IN NEBU: INHALATION_SOLUTION | RESPIRATORY_TRACT | Status: DC

## 2015-02-14 NOTE — Telephone Encounter (Signed)
Pt calling and states that the correct name that she needs is the ACCU-CHEK glucometer. Sadie Reynolds, ASA

## 2015-02-15 NOTE — Telephone Encounter (Signed)
Pt called to check the status of her Accu-Chek Glucometer. She needs this ASAP. Blima Rich

## 2015-02-16 NOTE — Telephone Encounter (Signed)
[name] is here wondering the status of her glucometer. Needs it urgently. Sadie Reynolds, ASA

## 2015-02-16 NOTE — Telephone Encounter (Signed)
Standardized Order form completed for meter and signed by Dr. Ree Kida.  Derl Barrow, RN

## 2015-03-07 ENCOUNTER — Telehealth: Payer: Self-pay | Admitting: Pharmacist Clinician (PhC)/ Clinical Pharmacy Specialist

## 2015-03-07 ENCOUNTER — Ambulatory Visit: Payer: Medicare Other | Admitting: Pharmacist Clinician (PhC)/ Clinical Pharmacy Specialist

## 2015-03-07 NOTE — Telephone Encounter (Signed)
Closed encounter °

## 2015-03-08 ENCOUNTER — Ambulatory Visit: Payer: Medicare Other | Admitting: Pharmacist Clinician (PhC)/ Clinical Pharmacy Specialist

## 2015-03-09 ENCOUNTER — Ambulatory Visit (INDEPENDENT_AMBULATORY_CARE_PROVIDER_SITE_OTHER): Payer: Medicare Other | Admitting: Pharmacist Clinician (PhC)/ Clinical Pharmacy Specialist

## 2015-03-09 DIAGNOSIS — Z7901 Long term (current) use of anticoagulants: Secondary | ICD-10-CM | POA: Diagnosis not present

## 2015-03-09 DIAGNOSIS — I4891 Unspecified atrial fibrillation: Secondary | ICD-10-CM

## 2015-03-09 LAB — POCT INR: INR: 2

## 2015-03-15 DIAGNOSIS — J4520 Mild intermittent asthma, uncomplicated: Secondary | ICD-10-CM | POA: Diagnosis not present

## 2015-03-16 ENCOUNTER — Other Ambulatory Visit: Payer: Self-pay | Admitting: Internal Medicine

## 2015-03-16 ENCOUNTER — Other Ambulatory Visit: Payer: Self-pay | Admitting: Family Medicine

## 2015-03-16 DIAGNOSIS — N2581 Secondary hyperparathyroidism of renal origin: Secondary | ICD-10-CM

## 2015-03-16 NOTE — Telephone Encounter (Signed)
REFILL 

## 2015-03-19 ENCOUNTER — Other Ambulatory Visit: Payer: Medicare Other

## 2015-03-19 DIAGNOSIS — N2581 Secondary hyperparathyroidism of renal origin: Secondary | ICD-10-CM

## 2015-03-19 LAB — COMPLETE METABOLIC PANEL WITH GFR
ALT: 11 U/L (ref 6–29)
AST: 15 U/L (ref 10–35)
Albumin: 4 g/dL (ref 3.6–5.1)
Alkaline Phosphatase: 57 U/L (ref 33–130)
BUN: 39 mg/dL — ABNORMAL HIGH (ref 7–25)
CHLORIDE: 108 mmol/L (ref 98–110)
CO2: 25 mmol/L (ref 20–31)
Calcium: 9.1 mg/dL (ref 8.6–10.4)
Creat: 1.72 mg/dL — ABNORMAL HIGH (ref 0.60–0.88)
GFR, EST NON AFRICAN AMERICAN: 27 mL/min — AB (ref >=60)
GFR, Est African American: 31 mL/min — ABNORMAL LOW (ref >=60)
GLUCOSE: 105 mg/dL — AB (ref 65–99)
POTASSIUM: 4.7 mmol/L (ref 3.5–5.3)
SODIUM: 144 mmol/L (ref 135–146)
Total Bilirubin: 0.8 mg/dL (ref 0.2–1.2)
Total Protein: 6.2 g/dL (ref 6.1–8.1)

## 2015-03-19 NOTE — Telephone Encounter (Signed)
Spoke with patient. Informed that she needs to have her labs checked before I refill her medication. She will call and make a lab appt.   Rosemarie Ax, MD PGY-3, [facility] 03/19/2015, 11:26 AM

## 2015-03-19 NOTE — Progress Notes (Signed)
VIT D, CMP AND PTH DONE TODAY [name]

## 2015-03-20 LAB — VITAMIN D 25 HYDROXY (VIT D DEFICIENCY, FRACTURES): VIT D 25 HYDROXY: 31 ng/mL (ref 30–100)

## 2015-03-20 LAB — PARATHYROID HORMONE, INTACT (NO CA): PTH: 91 pg/mL — AB (ref 14–64)

## 2015-03-21 ENCOUNTER — Telehealth: Payer: Self-pay | Admitting: Family Medicine

## 2015-03-21 DIAGNOSIS — N2581 Secondary hyperparathyroidism of renal origin: Secondary | ICD-10-CM

## 2015-03-21 MED ORDER — CALCITRIOL 0.25 MCG PO CAPS: ORAL_CAPSULE | ORAL | Status: DC

## 2015-03-21 NOTE — Telephone Encounter (Signed)
Pt called because she is wanting to know what her lab results were. She also said that the doctor was waiting for the lab results before giving her some medication. jw

## 2015-03-21 NOTE — Telephone Encounter (Signed)
Spoke with patient about her labs. Has had improvement in PTH. Will refill her calcitriol and she is taking a vit D/calcium supplement. She has f/u with Dr. Marval Regal ([facility]) and he can make adjustments if needed.   Rosemarie Ax, MD PGY-3, [facility] 03/21/2015, 11:26 AM

## 2015-03-23 ENCOUNTER — Telehealth: Payer: Self-pay | Admitting: Family Medicine

## 2015-03-23 NOTE — Telephone Encounter (Signed)
Pt called and would like to know how many of the vitamins she is suppose to be taking. jw

## 2015-03-29 NOTE — Telephone Encounter (Signed)
Per Dr. Doristine Locks note, seems like patient's nephrology is managing this. Will defer to patient's nephrologist for this question.

## 2015-04-03 NOTE — Telephone Encounter (Signed)
Spoke to patient, informed her the question needs to be asked to her nephrologist.  Patient understands and will give them a call. Ottis Stain, CMA

## 2015-04-06 ENCOUNTER — Ambulatory Visit: Payer: Medicare Other | Admitting: Pharmacist Clinician (PhC)/ Clinical Pharmacy Specialist

## 2015-04-09 ENCOUNTER — Ambulatory Visit (INDEPENDENT_AMBULATORY_CARE_PROVIDER_SITE_OTHER): Payer: Medicare Other | Admitting: Pharmacist Clinician (PhC)/ Clinical Pharmacy Specialist

## 2015-04-09 DIAGNOSIS — N183 Chronic kidney disease, stage 3 (moderate): Secondary | ICD-10-CM | POA: Diagnosis not present

## 2015-04-09 DIAGNOSIS — I4891 Unspecified atrial fibrillation: Secondary | ICD-10-CM

## 2015-04-09 DIAGNOSIS — D638 Anemia in other chronic diseases classified elsewhere: Secondary | ICD-10-CM | POA: Diagnosis not present

## 2015-04-09 DIAGNOSIS — Z7901 Long term (current) use of anticoagulants: Secondary | ICD-10-CM | POA: Diagnosis not present

## 2015-04-09 LAB — POCT INR: INR: 2

## 2015-04-14 DIAGNOSIS — J4520 Mild intermittent asthma, uncomplicated: Secondary | ICD-10-CM | POA: Diagnosis not present

## 2015-04-26 ENCOUNTER — Other Ambulatory Visit: Payer: Self-pay | Admitting: Internal Medicine

## 2015-05-01 ENCOUNTER — Other Ambulatory Visit: Payer: Self-pay | Admitting: Family Medicine

## 2015-05-01 ENCOUNTER — Other Ambulatory Visit: Payer: Self-pay | Admitting: Internal Medicine

## 2015-05-01 NOTE — Telephone Encounter (Signed)
Pt was supposed to receive a 90 day supply of amlodipine but only got 30.   Please advise

## 2015-05-01 NOTE — Telephone Encounter (Signed)
Dr Ree Kida wasn't the one that prescribed the amlodipine.  Pt wants to know if he could start prescribing the medication instead of the other dr

## 2015-05-02 DIAGNOSIS — M1711 Unilateral primary osteoarthritis, right knee: Secondary | ICD-10-CM | POA: Diagnosis not present

## 2015-05-07 ENCOUNTER — Ambulatory Visit: Payer: Medicare Other | Admitting: Pharmacist Clinician (PhC)/ Clinical Pharmacy Specialist

## 2015-05-10 ENCOUNTER — Ambulatory Visit (INDEPENDENT_AMBULATORY_CARE_PROVIDER_SITE_OTHER): Payer: Medicare Other | Admitting: Pharmacist Clinician (PhC)/ Clinical Pharmacy Specialist

## 2015-05-10 ENCOUNTER — Ambulatory Visit: Payer: Medicare Other | Admitting: Obstetrics and Gynecology

## 2015-05-10 DIAGNOSIS — I4891 Unspecified atrial fibrillation: Secondary | ICD-10-CM | POA: Diagnosis not present

## 2015-05-10 DIAGNOSIS — Z7901 Long term (current) use of anticoagulants: Secondary | ICD-10-CM

## 2015-05-10 LAB — POCT INR: INR: 1.8

## 2015-06-01 ENCOUNTER — Ambulatory Visit (INDEPENDENT_AMBULATORY_CARE_PROVIDER_SITE_OTHER): Payer: Medicare Other | Admitting: Pharmacist Clinician (PhC)/ Clinical Pharmacy Specialist

## 2015-06-01 ENCOUNTER — Other Ambulatory Visit: Payer: Self-pay | Admitting: Pharmacist Clinician (PhC)/ Clinical Pharmacy Specialist

## 2015-06-01 DIAGNOSIS — Z7901 Long term (current) use of anticoagulants: Secondary | ICD-10-CM | POA: Diagnosis not present

## 2015-06-01 DIAGNOSIS — I4891 Unspecified atrial fibrillation: Secondary | ICD-10-CM

## 2015-06-01 LAB — POCT INR: INR: 3.5

## 2015-06-01 MED ORDER — AMLODIPINE BESYLATE 5 MG PO TABS: ORAL_TABLET | ORAL | Status: DC

## 2015-06-07 ENCOUNTER — Other Ambulatory Visit: Payer: Self-pay | Admitting: Internal Medicine

## 2015-06-07 MED ORDER — AMLODIPINE BESYLATE 5 MG PO TABS: 5.0000 mg | ORAL_TABLET | Freq: Every day | ORAL | Status: DC

## 2015-06-07 NOTE — Telephone Encounter (Signed)
Rx(s) sent to pharmacy electronically.  

## 2015-06-07 NOTE — Addendum Note (Signed)
Addended by: Diana Eves on: 06/07/2015 05:38 PM   Modules accepted: Orders

## 2015-06-07 NOTE — Telephone Encounter (Signed)
°*  STAT* If patient is at the pharmacy, call can be transferred to refill team.   1. Which medications need to be refilled? (please list name of each medication and dose if known) Amlodipine 5 mg   2. Which pharmacy/location (including street and city if local pharmacy) is medication to be sent to? [facility] on [address]   3. Do they need a 30 day or 90 day supply? [place]

## 2015-06-14 ENCOUNTER — Other Ambulatory Visit: Payer: Self-pay | Admitting: Family Medicine

## 2015-06-14 ENCOUNTER — Other Ambulatory Visit: Payer: Self-pay | Admitting: Pharmacist Clinician (PhC)/ Clinical Pharmacy Specialist

## 2015-06-15 ENCOUNTER — Encounter: Payer: Medicare Other | Admitting: Pharmacist Clinician (PhC)/ Clinical Pharmacy Specialist

## 2015-06-15 MED ORDER — CARVEDILOL 6.25 MG PO TABS: ORAL_TABLET | ORAL | Status: DC

## 2015-06-21 ENCOUNTER — Ambulatory Visit (INDEPENDENT_AMBULATORY_CARE_PROVIDER_SITE_OTHER): Payer: Medicare Other | Admitting: Pharmacist Clinician (PhC)/ Clinical Pharmacy Specialist

## 2015-06-21 DIAGNOSIS — Z7901 Long term (current) use of anticoagulants: Secondary | ICD-10-CM | POA: Diagnosis not present

## 2015-06-21 DIAGNOSIS — I4891 Unspecified atrial fibrillation: Secondary | ICD-10-CM

## 2015-06-21 LAB — POCT INR: INR: 3.6

## 2015-06-26 ENCOUNTER — Telehealth: Payer: Self-pay | Admitting: Family Medicine

## 2015-06-26 NOTE — Telephone Encounter (Signed)
She will wait to see her kidney doctor to draw her labs.   Rosemarie Ax, MD PGY-3, [facility] 06/26/2015, 3:46 PM

## 2015-06-26 NOTE — Telephone Encounter (Signed)
Pt called and would like the doctor to put in orders for her to have labs done. She wants a full work up everything. jw

## 2015-06-27 ENCOUNTER — Other Ambulatory Visit: Payer: Self-pay | Admitting: Internal Medicine

## 2015-06-27 NOTE — Telephone Encounter (Signed)
REFILL 

## 2015-07-11 ENCOUNTER — Encounter: Payer: Medicare Other | Admitting: Pharmacist Clinician (PhC)/ Clinical Pharmacy Specialist

## 2015-07-17 ENCOUNTER — Ambulatory Visit (INDEPENDENT_AMBULATORY_CARE_PROVIDER_SITE_OTHER): Payer: Medicare Other | Admitting: Pharmacist Clinician (PhC)/ Clinical Pharmacy Specialist

## 2015-07-17 DIAGNOSIS — I4891 Unspecified atrial fibrillation: Secondary | ICD-10-CM

## 2015-07-17 DIAGNOSIS — Z7901 Long term (current) use of anticoagulants: Secondary | ICD-10-CM

## 2015-07-17 LAB — POCT INR: INR: 3.8

## 2015-07-17 MED ORDER — APIXABAN 2.5 MG PO TABS: 2.5000 mg | ORAL_TABLET | Freq: Two times a day (BID) | ORAL | Status: DC

## 2015-07-18 ENCOUNTER — Other Ambulatory Visit: Payer: Self-pay | Admitting: Internal Medicine

## 2015-07-18 MED ORDER — AMLODIPINE BESYLATE 5 MG PO TABS: 5.0000 mg | ORAL_TABLET | Freq: Every day | ORAL | Status: DC

## 2015-07-18 NOTE — Telephone Encounter (Signed)
Rx request sent to pharmacy.  

## 2015-07-18 NOTE — Telephone Encounter (Signed)
°*  STAT* If patient is at the pharmacy, call can be transferred to refill team.   1. Which medications need to be refilled? (please list name of each medication and dose if known) Amlodipine   2. Which pharmacy/location (including street and city if local pharmacy) is medication to be sent to? [facility] on [address]   3. Do they need a 30 day or 90 day supply? [place]

## 2015-07-18 NOTE — Telephone Encounter (Signed)
Rx(s) sent to pharmacy electronically.  

## 2015-07-26 ENCOUNTER — Encounter: Payer: Self-pay | Admitting: Internal Medicine

## 2015-07-26 ENCOUNTER — Ambulatory Visit (INDEPENDENT_AMBULATORY_CARE_PROVIDER_SITE_OTHER): Payer: Medicare Other | Admitting: Internal Medicine

## 2015-07-26 VITALS — BP 114/56 | HR 68 | Ht 67.0 in | Wt 158.1 lb

## 2015-07-26 DIAGNOSIS — R0602 Shortness of breath: Secondary | ICD-10-CM | POA: Diagnosis not present

## 2015-07-26 DIAGNOSIS — J45909 Unspecified asthma, uncomplicated: Secondary | ICD-10-CM

## 2015-07-26 DIAGNOSIS — I447 Left bundle-branch block, unspecified: Secondary | ICD-10-CM | POA: Diagnosis not present

## 2015-07-26 DIAGNOSIS — D638 Anemia in other chronic diseases classified elsewhere: Secondary | ICD-10-CM | POA: Diagnosis not present

## 2015-07-26 DIAGNOSIS — I4891 Unspecified atrial fibrillation: Secondary | ICD-10-CM | POA: Diagnosis not present

## 2015-07-26 DIAGNOSIS — N183 Chronic kidney disease, stage 3 (moderate): Secondary | ICD-10-CM | POA: Diagnosis not present

## 2015-07-26 DIAGNOSIS — N2581 Secondary hyperparathyroidism of renal origin: Secondary | ICD-10-CM | POA: Diagnosis not present

## 2015-07-26 DIAGNOSIS — E785 Hyperlipidemia, unspecified: Secondary | ICD-10-CM

## 2015-07-26 DIAGNOSIS — I10 Essential (primary) hypertension: Secondary | ICD-10-CM

## 2015-07-26 DIAGNOSIS — Z7901 Long term (current) use of anticoagulants: Secondary | ICD-10-CM

## 2015-07-26 DIAGNOSIS — J449 Chronic obstructive pulmonary disease, unspecified: Secondary | ICD-10-CM

## 2015-07-26 NOTE — Patient Instructions (Signed)
Your physician has requested that you have an echocardiogram. Echocardiography is a painless test that uses sound waves to create images of your heart. It provides your doctor with information about the size and shape of your heart and how well your heart's chambers and valves are working. This procedure takes approximately one hour. There are no restrictions for this procedure.  Your physician recommends that you return for lab work TOMORROW.  Dr Debara Pickett recommends that you schedule a follow-up appointment after your echocardiogram.   If you need a refill on your cardiac medications before your next appointment, please call your pharmacy.

## 2015-07-27 ENCOUNTER — Telehealth: Payer: Self-pay | Admitting: Internal Medicine

## 2015-07-27 LAB — COMPREHENSIVE METABOLIC PANEL
ALT: 8 U/L (ref 6–29)
AST: 12 U/L (ref 10–35)
Albumin: 3.9 g/dL (ref 3.6–5.1)
Alkaline Phosphatase: 48 U/L (ref 33–130)
BILIRUBIN TOTAL: 1.3 mg/dL — AB (ref 0.2–1.2)
BUN: 45 mg/dL — ABNORMAL HIGH (ref 7–25)
CALCIUM: 9.2 mg/dL (ref 8.6–10.4)
CO2: 27 mmol/L (ref 20–31)
Chloride: 106 mmol/L (ref 98–110)
Creat: 1.67 mg/dL — ABNORMAL HIGH (ref 0.60–0.88)
GLUCOSE: 102 mg/dL — AB (ref 65–99)
POTASSIUM: 4.6 mmol/L (ref 3.5–5.3)
Sodium: 141 mmol/L (ref 135–146)
Total Protein: 6.3 g/dL (ref 6.1–8.1)

## 2015-07-27 LAB — CBC
HCT: 37.4 % (ref 36.0–46.0)
HEMOGLOBIN: 12 g/dL (ref 12.0–15.0)
MCH: 30.6 pg (ref 26.0–34.0)
MCHC: 32.1 g/dL (ref 30.0–36.0)
MCV: 95.4 fL (ref 78.0–100.0)
MPV: 10.3 fL (ref 8.6–12.4)
Platelets: 197 10*3/uL (ref 150–400)
RBC: 3.92 MIL/uL (ref 3.87–5.11)
RDW: 14 % (ref 11.5–15.5)
WBC: 6.9 10*3/uL (ref 4.0–10.5)

## 2015-07-27 MED ORDER — AMLODIPINE BESYLATE 5 MG PO TABS: 5.0000 mg | ORAL_TABLET | Freq: Every day | ORAL | Status: DC

## 2015-07-27 NOTE — Telephone Encounter (Signed)
Refills sent

## 2015-07-27 NOTE — Telephone Encounter (Signed)
New message       *STAT* If patient is at the pharmacy, call can be transferred to refill team.   1. Which medications need to be refilled? (please list name of each medication and dose if known) amlodipine 5mg  2. Which pharmacy/location (including street and city if local pharmacy) is medication to be sent to? [facility] on [address] 3. Do they need a 30 day or 90 day supply? Pt want 80 tablets  Also, pt did not get her labs drawn yesterday.  She will go downstairs to the lab today after 4 to have them drawn

## 2015-07-27 NOTE — Telephone Encounter (Signed)
Spoke with pt, aware okay to get labs drawn today. orders are placed. Lab hours discussed with pt.

## 2015-07-27 NOTE — Telephone Encounter (Signed)
New message    Patient wants to come in for blood work for today. Would like a call back

## 2015-07-28 LAB — BRAIN NATRIURETIC PEPTIDE: Brain Natriuretic Peptide: 199.9 pg/mL — ABNORMAL HIGH (ref 0.0–100.0)

## 2015-07-29 NOTE — Progress Notes (Addendum)
OFFICE NOTE  Chief Complaint:  Mild dyspnea with exertion  Primary Care Physician: Clearance Coots, MD  HPI:  [name] is an 82 year old female with history of AFib, left bundle branch block. She recently had an echo in our office in January 2013 which showed an of 40% to 45%. Subsequently, her medications were adjusted. She ended up being hospitalized, and in-hospital echo showed an improvement in the EF up to 60% to 65%. She has also had some improvement in her serum creatinine, which has come down, but does have stage III chronic kidney disease (however, she reports her creatinine has improved to 1.2 recently). Her AFib rate has been well controlled at 73. Overall, she is asymptomatic. Denies any heart failure symptoms, chest pain, palpitations, presyncope, or syncopal symptoms.  She does report some fatigue recently and her blood pressure is noted to be low today.  He has some occasional lower extremity swelling worse on the left than the right leg.  Kianah returns today for follow-up. She has some mild dyspnea with exertion. She denies chest pain. Blood pressure is well-controlled today. Weight is stable. Her appetite is good. She remains in A. Fib with adequate rate control in the 60s. She switch from warfarin to Eliquis due to difficulty controlling her INRs and she says this is worked out well for her.  PMHx:  Past Medical History  Diagnosis Date  . Anemia     anemia of renal disease  . Diabetes mellitus   . Hypertension   . Chronic kidney disease     Stage III- followed by [facility]  . CHF (congestive heart failure) ([place])     Diastolic dys-- EF 16% july 2011  . Hyperlipidemia   . Arthritis     mutiple joints  . Cancer [facility])     Renal neoplasm- survellience via Urology  . Peripheral neuropathy ([place])   . COPD (chronic obstructive pulmonary disease) ([place])   . DIASTOLIC DYSFUNCTION 07/09/6043    Qualifier: Diagnosis of  By: Buelah Manis MD, Lonell Grandchild    . NEOPLASM, KIDNEY  03/04/2007    complex right renal cyst; Per patient- Urologist no longer following- no changes.      . Venous insufficiency 02/24/2011  . Shortness of breath     Hx: of with exertion  . GERD (gastroesophageal reflux disease)   . H/O hiatal hernia   . Left bundle branch block   . A-fib ([place])   . Pneumonia   . Nonischemic cardiomyopathy [facility])     Past Surgical History  Procedure Laterality Date  . Cholecystectomy  1987  . Cataract extraction w/ intraocular lens  implant, bilateral    . Tonsillectomy    . Abdominal hysterectomy    . Dilation and curettage of uterus    . Lumbar laminectomy/decompression microdiscectomy Left 05/12/2013    Procedure: Left Lumbar four-five laminotomy and microdiskectomy;  Surgeon: Hosie Spangle, MD;  Location: [facility];  Service: Neurosurgery;  Laterality: Left;  Left Lumbar four-five laminotomy and microdiskectomy  . Transthoracic echocardiogram  09/2011    EF 60-65%; MV mod-severely calcified, mildly thickened MV annulus, mild MR; LA mod dilated; RA mildly dilated  . Nm myocar perf wall motion  05/2011    lexiscan myoview - fixed septal defect r/t LBBB; normal pattern of perfusion in all regions; low risk scan  . Cardiac catheterization  08/12/2011    heavily calcified L main; LAD with no stenosis; 1st diagonal free of stenosis; L Cfx with no  stenosis; OM1/2 without stenosis; PDA without stenosis; RCA without significant stenosis; non-ischemic cardiomyopathy (Dr. Kathy Breach)  . Left heart catheterization with coronary angiogram N/A 08/12/2011    Procedure: LEFT HEART CATHETERIZATION WITH CORONARY ANGIOGRAM;  Surgeon: Pixie Casino, MD;  Location: [facility] CATH LAB;  Service: Cardiovascular;  Laterality: N/A;    FAMHx:  Family History  Problem Relation Age of Onset  . Heart disease Mother   . Arthritis Mother     SOCHx:   reports that she has been passively smoking.  She has never used smokeless tobacco. She reports that she does not drink alcohol  or use illicit drugs.  ALLERGIES:  No Known Allergies  ROS: Pertinent items noted in HPI and remainder of comprehensive ROS otherwise negative.  HOME MEDS: Current Outpatient Prescriptions  Medication Sig Dispense Refill  . albuterol (PROVENTIL HFA;VENTOLIN HFA) 108 (90 BASE) MCG/ACT inhaler Inhale 2 puffs into the lungs every 6 (six) hours as needed for wheezing or shortness of breath. 3 Inhaler 1  . albuterol (PROVENTIL) (2.5 MG/3ML) 0.083% nebulizer solution inhale contents of 1 vial in nebulizer every 6 hours if needed for wheezing or shortness of breath 75 vial 0  . apixaban (ELIQUIS) 2.5 MG TABS tablet Take 1 tablet (2.5 mg total) by mouth 2 (two) times daily. 60 tablet 5  . aspirin EC 81 MG tablet Take 81 mg by mouth daily.    Marland Kitchen atorvastatin (LIPITOR) 80 MG tablet Take 1 tablet (80 mg total) by mouth at bedtime. 90 tablet 3  . Blood Glucose Monitoring Suppl (ACCU-CHEK AVIVA PLUS) W/DEVICE KIT use as directed 1 kit 0  . calcitRIOL (ROCALTROL) 0.25 MCG capsule take 1 capsule by mouth EVERY MONDAY, WEDNESDAY AND FRIDAY 15 capsule 2  . Calcium Carbonate-Vitamin D (CALCIUM 600+D) 600-200 MG-UNIT TABS Take 1 tablet by mouth daily.     . carvedilol (COREG) 6.25 MG tablet Take 1 tablet by mouth two  times daily with meals 180 tablet 0  . Chromium-Cinnamon (CINNAMON PLUS CHROMIUM PO) Take 1 capsule by mouth 2 (two) times daily. 79mg/500mg    . CINNAMON PO Take by mouth daily.    .Marland Kitchendocusate sodium (COLACE) 100 MG capsule Take 200 mg by mouth 2 (two) times daily.    . furosemide (LASIX) 20 MG tablet take 1 tablet by mouth once daily 90 tablet 0  . gabapentin (NEURONTIN) 300 MG capsule Take one 300 mg capsule nightly. 90 capsule 1  . losartan (COZAAR) 50 MG tablet Take 1 tablet (50 mg total) by mouth daily. KEEP APPOINTMENT FOR MORE REFILLS. 30 tablet 0  . Omega-3 Fatty Acids (FISH OIL) 1000 MG CAPS Take 1 capsule by mouth 2 (two) times daily.     .Marland Kitchenomeprazole (PRILOSEC) 20 MG capsule Take 1  capsule (20 mg total) by mouth daily. 30 capsule 0  . ONE TOUCH ULTRA TEST test strip TEST twice a day FOR SUGAR 100 each 9  . Polyvinyl Alcohol-Povidone (REFRESH OP) Place 1 drop into both eyes 3 (three) times daily as needed (dry eyes).    . ranitidine (ZANTAC) 150 MG tablet take 1 tablet by mouth twice a day 180 tablet 1  . senna-docusate (SENOKOT-S) 8.6-50 MG per tablet Take 1 tablet by mouth 2 (two) times daily as needed for mild constipation. 60 tablet 0  . vitamin C (ASCORBIC ACID) 500 MG tablet Take 500 mg by mouth 2 (two) times daily.    .Marland KitchenamLODipine (NORVASC) 5 MG tablet Take 1 tablet (5 mg total)  by mouth daily. 90 tablet 0   No current facility-administered medications for this visit.   Facility-Administered Medications Ordered in Other Visits  Medication Dose Route Frequency Provider Last Rate Last Dose  . 0.9 %  sodium chloride infusion   Intravenous Continuous Pixie Casino, MD      . sodium chloride 0.9 % injection 3 mL  3 mL Intravenous Q12H Pixie Casino, MD        LABS/IMAGING: No results found for this or any previous visit (from the past 48 hour(s)). No results found.  VITALS: BP 114/56 mmHg  Pulse 68  Ht _0  (1.702 m)  Wt 158 lb 2 oz (71.725 kg)  BMI 24.76 kg/m2  EXAM: General appearance: alert and no distress Neck: no carotid bruit and no JVD Lungs: clear to auscultation bilaterally Heart: irregularly irregular rhythm and systolic murmur: holosystolic 3/6, crescendo at apex Abdomen: soft, non-tender; bowel sounds normal; no masses,  no organomegaly Extremities: edema trace LLE edema Pulses: 2+ and symmetric Skin: Skin color, texture, turgor normal. No rashes or lesions Neurologic: Grossly normal Psych: Mood, affect normal  EKG: Atrial fibrillation at 68  ASSESSMENT: 1. Persistent if not permanent atrial fibrillation - CHADSVASC of 5 (anticoagulated on Eliquis) 2. Left bundle-branch block 3. Hypertension 4. Dyslipidemia 5. Mitral  murmur  PLAN: 1.   [name] is mildly dyspneic. Her A. Fib is rate controlled. Her blood pressure is at goal. She likes Eliquis much better than warfarin and she was not well controlled on warfarin. Left bundle branch block is stable. Cholesterol is followed by her primary care provider. There is a mitral murmur on exam today. Her last echo was many years ago. I like to reassess that as she had severe calcification and mild regurgitation at that time. Would also like to see if there has been a change in LV function to explain dyspnea. Check labs, including CBC, BNP and BMET. Plan to see her back after the studies for follow-up.  Pixie Casino, MD, [facility] Attending Cardiologist [place] 07/29/2015, 5:20 PM

## 2015-07-30 ENCOUNTER — Telehealth: Payer: Self-pay | Admitting: Internal Medicine

## 2015-07-30 MED ORDER — FUROSEMIDE 20 MG PO TABS: 20.0000 mg | ORAL_TABLET | Freq: Every day | ORAL | Status: DC

## 2015-07-30 NOTE — Telephone Encounter (Signed)
Pt given instructions regarding increased lasix for 3 days then resuming normal dose based on Dr. Lysbeth Penner recommendations. Refill sent to pharmacy at patient's request.

## 2015-07-30 NOTE — Telephone Encounter (Signed)
Pt called in stating that she had some lab work done on 1/27 and she wanted to know if the results were available. Please f/u with her  Thanks

## 2015-07-31 ENCOUNTER — Ambulatory Visit: Payer: Medicare Other | Admitting: Family Medicine

## 2015-07-31 ENCOUNTER — Telehealth: Payer: Self-pay | Admitting: Family Medicine

## 2015-07-31 MED ORDER — ATORVASTATIN CALCIUM 80 MG PO TABS: 80.0000 mg | ORAL_TABLET | Freq: Every day | ORAL | Status: DC

## 2015-07-31 MED ORDER — CALCITRIOL 0.25 MCG PO CAPS: ORAL_CAPSULE | ORAL | Status: DC

## 2015-07-31 MED ORDER — RANITIDINE HCL 150 MG PO TABS: 150.0000 mg | ORAL_TABLET | Freq: Two times a day (BID) | ORAL | Status: DC

## 2015-07-31 MED ORDER — CARVEDILOL 6.25 MG PO TABS: ORAL_TABLET | ORAL | Status: DC

## 2015-07-31 NOTE — Telephone Encounter (Signed)
Spoke with patient this morning. She is requesting 90 day supple of these medications.   eliquis Amlodipine Lasix The above medications were recently filled by Dr. Debara Pickett.   Coreg Zantac Refilled the above two for 90 days   Losartan This was filled recently by Dr. Debara Pickett and needs to keep her appt for more refills.    Calcitriol lipitor Refilled the above two with 90 day supplies   Rosemarie Ax, MD PGY-3, [facility] 07/31/2015, 9:10 AM

## 2015-08-01 ENCOUNTER — Telehealth: Payer: Self-pay | Admitting: Internal Medicine

## 2015-08-01 ENCOUNTER — Telehealth: Payer: Self-pay | Admitting: Family Medicine

## 2015-08-01 MED ORDER — APIXABAN 2.5 MG PO TABS: 2.5000 mg | ORAL_TABLET | Freq: Two times a day (BID) | ORAL | Status: DC

## 2015-08-01 NOTE — Telephone Encounter (Signed)
New message       *STAT* If patient is at the pharmacy, call can be transferred to refill team.   1. Which medications need to be refilled? (please list name of each medication and dose if known) eliquis 2.5mg   2. Which pharmacy/location (including street and city if local pharmacy) is medication to be sent to[facility] on [address]  3. Do they need a 30 day or 90 day supply? 180 pills

## 2015-08-01 NOTE — Telephone Encounter (Signed)
Refill sent to pharmacy.   

## 2015-08-01 NOTE — Telephone Encounter (Signed)
EMERGENCY PHONE LINE:  Contacted by Mrs. Garringer, who reported accidentally taking two doses of Eliquis, Calcitriol, Carvedilol, and Atorvastatin. Took first dose at 6pm and second dose at 7:15pm. Feels fine, denying shortness of breath, dizziness, lightheadedness.   [place] to review medications. Stated Eliquis saturates binding sites and would not cause problems without trauma. Cautioned on being extra careful to not fall or cut herself. Husband to keep a close eye on her. To postpone morning dose of Eliquis to 11-12pm and then resume normal schedule. No adverse effects expected from Atorvastatin or Calcitriol. Due to Carvedilol, will monitor HR and BP at 8pm (peaks in 1-2hr) and before going to bed. Has blood pressure cuff and reviewed process to check HR. To call back or go to ED if BP <100/60 or HR<55. Cautioned to be careful rising from seated position to to wait a few seconds before ambulating. Patient repeated back instructions accurately and wrote them down to ensure she was able to remember plan. Instructed to go to ED if symptoms of dizziness, weakness, or lightheadedness occur.  Dr. Gerlean Ren 08/01/15, 7:53 PM

## 2015-08-02 ENCOUNTER — Telehealth: Payer: Self-pay | Admitting: Internal Medicine

## 2015-08-02 NOTE — Telephone Encounter (Signed)
New message      Talk to the nurse regarding her eliquis.  She said the doctor needs to call and tell the pharmacist that she really needs this medication.  Not sure what is going on.  Please call pt

## 2015-08-02 NOTE — Telephone Encounter (Signed)
Returned call to patient.  She states pharmacy gave her notification that a prior authorization would be required for the Eliquis.  She has ~10 days worth of pills left. I informed her I would provide samples at the front desk and we would address the prior authorization. Pt aware.  Spoke to [place] and routed for [employer].

## 2015-08-02 NOTE — Telephone Encounter (Signed)
Follow up     Pt calls back to say that the pharmacist needs prior authorization on her eliquis.

## 2015-08-03 NOTE — Telephone Encounter (Signed)
Prior authorization for Eliquis 2.5 mg sent to patient's insurance company via covermymeds. Awaiting approval.

## 2015-08-03 NOTE — Telephone Encounter (Signed)
Called patient to request pharmacy number on the back of her insurance card.

## 2015-08-06 ENCOUNTER — Other Ambulatory Visit: Payer: Self-pay | Admitting: *Deleted

## 2015-08-06 NOTE — Telephone Encounter (Signed)
Prior authorization for Eliquis 2.5 mg has been approved through 06/29/2016. Patient notified.

## 2015-08-06 NOTE — Telephone Encounter (Signed)
Pt aware any potential risks of taking Eliquis outweighed by benefits. Note she is on lower dose of Eliquis due to age related/renal factors.  Pt aware Eliquis PA was submitted last week and anticipate status update on the PA this week.

## 2015-08-06 NOTE — Telephone Encounter (Signed)
New Message   Pt called states that she received medicine for her heart and she is having kidney problems. Request a call back to discuss to a call back to discuss if she should discontinue taking the eliquis. Please call back to discuss.

## 2015-08-08 MED ORDER — ALBUTEROL SULFATE HFA 108 (90 BASE) MCG/ACT IN AERS: 2.0000 | INHALATION_SPRAY | Freq: Four times a day (QID) | RESPIRATORY_TRACT | Status: DC | PRN

## 2015-08-09 ENCOUNTER — Other Ambulatory Visit: Payer: Self-pay | Admitting: *Deleted

## 2015-08-10 ENCOUNTER — Other Ambulatory Visit (HOSPITAL_COMMUNITY): Payer: Medicare Other

## 2015-08-14 ENCOUNTER — Other Ambulatory Visit: Payer: Self-pay | Admitting: *Deleted

## 2015-08-14 DIAGNOSIS — L84 Corns and callosities: Secondary | ICD-10-CM | POA: Diagnosis not present

## 2015-08-14 DIAGNOSIS — L602 Onychogryphosis: Secondary | ICD-10-CM | POA: Diagnosis not present

## 2015-08-14 DIAGNOSIS — M2042 Other hammer toe(s) (acquired), left foot: Secondary | ICD-10-CM | POA: Diagnosis not present

## 2015-08-20 ENCOUNTER — Other Ambulatory Visit: Payer: Self-pay | Admitting: Internal Medicine

## 2015-08-20 ENCOUNTER — Telehealth: Payer: Self-pay | Admitting: Family Medicine

## 2015-08-20 NOTE — Telephone Encounter (Signed)
Pt called because she is taking Lipitor and wanted to know does she need to come in for any test since this medication can damage the liver. jw

## 2015-08-20 NOTE — Telephone Encounter (Signed)
Rx refill sent to pharmacy. 

## 2015-08-21 NOTE — Telephone Encounter (Signed)
Pt informed of Dr. Raeford Razor comment below. Katharina Caper, [employer], [place]

## 2015-08-24 ENCOUNTER — Other Ambulatory Visit: Payer: Self-pay

## 2015-08-24 ENCOUNTER — Ambulatory Visit (HOSPITAL_COMMUNITY): Payer: Medicare Other | Attending: Cardiovascular Disease

## 2015-08-24 ENCOUNTER — Telehealth: Payer: Self-pay | Admitting: Internal Medicine

## 2015-08-24 DIAGNOSIS — I313 Pericardial effusion (noninflammatory): Secondary | ICD-10-CM | POA: Diagnosis not present

## 2015-08-24 DIAGNOSIS — R0600 Dyspnea, unspecified: Secondary | ICD-10-CM | POA: Diagnosis present

## 2015-08-24 DIAGNOSIS — R0602 Shortness of breath: Secondary | ICD-10-CM | POA: Diagnosis not present

## 2015-08-24 DIAGNOSIS — E119 Type 2 diabetes mellitus without complications: Secondary | ICD-10-CM | POA: Insufficient documentation

## 2015-08-24 DIAGNOSIS — I059 Rheumatic mitral valve disease, unspecified: Secondary | ICD-10-CM | POA: Insufficient documentation

## 2015-08-24 DIAGNOSIS — I110 Hypertensive heart disease with heart failure: Secondary | ICD-10-CM | POA: Diagnosis not present

## 2015-08-24 DIAGNOSIS — E785 Hyperlipidemia, unspecified: Secondary | ICD-10-CM | POA: Diagnosis not present

## 2015-08-24 DIAGNOSIS — I071 Rheumatic tricuspid insufficiency: Secondary | ICD-10-CM | POA: Diagnosis not present

## 2015-08-24 DIAGNOSIS — I509 Heart failure, unspecified: Secondary | ICD-10-CM | POA: Diagnosis not present

## 2015-08-24 DIAGNOSIS — I4891 Unspecified atrial fibrillation: Secondary | ICD-10-CM

## 2015-08-24 DIAGNOSIS — I447 Left bundle-branch block, unspecified: Secondary | ICD-10-CM

## 2015-08-24 NOTE — Telephone Encounter (Signed)
Returned call to patient. She states she will be able to make it for her echocardiogram today, but notes increasing difficulty in getting to appts. She is not sure if she will be able to make f/u appt - already had to reschedule once.  Pt does not have her own transportation and relies on neighbor to drive her.  Informed patient i would communicate this to Dr. Debara Pickett. Mentioned we could look at SCAT or other transportation alternatives. Pt amenable to this.

## 2015-08-24 NOTE — Telephone Encounter (Signed)
I will support any medicare transportation options for her to get to appointments. Not sure how to order this.  Dr. Lemmie Evens

## 2015-08-24 NOTE — Telephone Encounter (Signed)
Please call,pt says it is hard for her to come to her appointments. She says she wants to talk to the nurse,so she can see what can be done.

## 2015-08-24 NOTE — Telephone Encounter (Signed)
LM that provider portion of SCAT application will be completed by MD then mailed to her for completion and submission.

## 2015-08-27 ENCOUNTER — Ambulatory Visit: Payer: Medicare Other | Admitting: Internal Medicine

## 2015-08-27 ENCOUNTER — Telehealth: Payer: Self-pay | Admitting: Internal Medicine

## 2015-08-27 NOTE — Telephone Encounter (Signed)
Pt wants echo from you from 08-24-15 please.

## 2015-08-27 NOTE — Telephone Encounter (Signed)
Routed to Jenna 

## 2015-08-27 NOTE — Telephone Encounter (Signed)
Called patient - spoke with husband - provided echo results to husband listed on DPR

## 2015-08-28 NOTE — Telephone Encounter (Signed)
Called patient regarding OV 3/1 and SCAT application Patient states she will not be able to make her appt on 3/1 and would like to cancel her appt - done Informed patient I will mail her SCAT application for her to complete and submit. She voiced understanding.

## 2015-08-29 ENCOUNTER — Ambulatory Visit: Payer: Medicare Other | Admitting: Internal Medicine

## 2015-09-06 ENCOUNTER — Other Ambulatory Visit: Payer: Self-pay | Admitting: *Deleted

## 2015-09-07 MED ORDER — ALBUTEROL SULFATE (2.5 MG/3ML) 0.083% IN NEBU: INHALATION_SOLUTION | RESPIRATORY_TRACT | Status: DC

## 2015-09-07 NOTE — Telephone Encounter (Signed)
Pt calls and she needs this refilled asap.  She is completely out and does not want to go to the hospital. [place], Salome [name]

## 2015-09-07 NOTE — Telephone Encounter (Signed)
Pt informed that rx was called in. Onya Eutsler, Salome [name], CMA

## 2015-09-12 ENCOUNTER — Encounter: Payer: Self-pay | Admitting: Family Medicine

## 2015-09-12 ENCOUNTER — Ambulatory Visit (INDEPENDENT_AMBULATORY_CARE_PROVIDER_SITE_OTHER): Payer: Medicare Other | Admitting: Family Medicine

## 2015-09-12 VITALS — BP 116/87 | HR 74 | Temp 97.9°F | Wt 159.0 lb

## 2015-09-12 DIAGNOSIS — J441 Chronic obstructive pulmonary disease with (acute) exacerbation: Secondary | ICD-10-CM | POA: Diagnosis not present

## 2015-09-12 MED ORDER — PREDNISONE 50 MG PO TABS: 50.0000 mg | ORAL_TABLET | Freq: Every day | ORAL | Status: DC

## 2015-09-12 MED ORDER — DOXYCYCLINE HYCLATE 100 MG PO TABS: 100.0000 mg | ORAL_TABLET | Freq: Two times a day (BID) | ORAL | Status: DC

## 2015-09-12 NOTE — Progress Notes (Signed)
   Subjective:    Patient ID: [name], female    DOB: March 27, 1928, 82 y.o.   MRN: [id number]  HPI 82 yo frail patient who comes in stating she needs to be hospitalized.  Has chronic DX of COPD with asthma, not on any controler.  States only has flair every couple of years but needs to be hospitalized each time.  States she has had wheezing for 1 week.  No fever.  Yes SOB.  Mininimal non productive cough.  No infectious exposure.  Denies leg swelling. Denies CP    Review of Systems     Objective:   Physical Exam No respiratory distress.  Normal rate.  Speaks in full senteences.   Pulse ox = 98% Lungs no wheeze.  Prolonged exp phase. Cardiac.  RRR without m or g Ext no edema       Assessment & Plan:

## 2015-09-12 NOTE — Patient Instructions (Signed)
See me tomorrow to make sure you are getting better.  Make an appointment on the way out. Stop on the way home and get the two prescriptions.  I want you to start right away.

## 2015-09-12 NOTE — Assessment & Plan Note (Signed)
Seems like a mild exacerbation.  I was able to convince that she did not need hospitalization.  Will RX with prednisone and doxy.  Given extremes of age and general frailty, will recheck in morning to make sure she is improving.

## 2015-09-13 ENCOUNTER — Ambulatory Visit: Payer: Medicare Other | Admitting: Family Medicine

## 2015-09-23 ENCOUNTER — Other Ambulatory Visit: Payer: Self-pay | Admitting: Internal Medicine

## 2015-09-24 NOTE — Telephone Encounter (Signed)
Rx refill sent to pharmacy. 

## 2015-09-27 ENCOUNTER — Ambulatory Visit (INDEPENDENT_AMBULATORY_CARE_PROVIDER_SITE_OTHER): Payer: Medicare Other | Admitting: Family Medicine

## 2015-09-27 VITALS — BP 138/66 | HR 70 | Temp 98.1°F | Ht 67.0 in | Wt 156.8 lb

## 2015-09-27 DIAGNOSIS — J302 Other seasonal allergic rhinitis: Secondary | ICD-10-CM | POA: Diagnosis not present

## 2015-09-27 MED ORDER — FLUTICASONE PROPIONATE 50 MCG/ACT NA SUSP: 2.0000 | Freq: Every day | NASAL | Status: DC

## 2015-09-27 MED ORDER — LORATADINE 10 MG PO TABS: 10.0000 mg | ORAL_TABLET | Freq: Every day | ORAL | Status: DC

## 2015-09-27 NOTE — Patient Instructions (Signed)
Thank you for coming to see me today. It was a pleasure. Today we talked about:   Congestion: I will prescribe a nasal spray, Flonase, and Claritin. If no improvement, please return for follow-up  If you have any questions or concerns, please do not hesitate to call the office at [phone].  Sincerely,  Cordelia Poche, MD

## 2015-09-27 NOTE — Progress Notes (Signed)
    Subjective   [name] is a 82 y.o. female that presents for a same Nancy visit  1. Nasal congestion: Symptoms started two weeks ago she reports nasal congestion with associated rhinorrhea, sneezing and intermittent somewhat productive coughing. No headache or fevers. No sick contacts. She has some itchy eyes with tearing  ROS Per HPI  Social History  Substance Use Topics  . Smoking status: Passive Smoke Exposure - Never Smoker  . Smokeless tobacco: Never Used  . Alcohol Use: No    No Known Allergies  Objective   BP 138/66 mmHg  Pulse 70  Temp(Src) 98.1 F (36.7 C) (Oral)  Ht 5\' 7"  (1.702 m)  Wt 156 lb 12.8 oz (71.124 kg)  BMI 24.55 kg/m2  SpO2 95%  General: Well appearing, no distress HEENT:   Head:  Normocephalic  Eyes: Pupils equal and reactive to light/accomodation. Extraocular movements intact bilaterally.  Ears: Tympanic membranes normal bilaterally.  Nose/Throat: Nares patent bilaterally with edematous nasal mucosa. Oropharnx clear and moist.  Neck: No cervical adenopathy bilaterally Respiratory/Chest: Clear to auscultation bilaterally. Unlabored work of breathing. No wheezing or rales.  Assessment and Plan   1. Seasonal allergies Symptoms appear secondary to allergies. - fluticasone (FLONASE) 50 MCG/ACT nasal spray; Place 2 sprays into both nostrils daily.  Dispense: 16 g; Refill: 6 - loratadine (CLARITIN) 10 MG tablet; Take 1 tablet (10 mg total) by mouth daily.  Dispense: 30 tablet; Refill: 11

## 2015-10-11 ENCOUNTER — Emergency Department (HOSPITAL_COMMUNITY): Payer: Medicare Other

## 2015-10-11 ENCOUNTER — Encounter (HOSPITAL_COMMUNITY): Payer: Self-pay

## 2015-10-11 ENCOUNTER — Emergency Department (HOSPITAL_COMMUNITY)
Admission: EM | Admit: 2015-10-11 | Discharge: 2015-10-11 | Disposition: A | Discharge status: Home / Self Care | Payer: Medicare Other | Attending: Emergency Medicine | Admitting: Emergency Medicine

## 2015-10-11 DIAGNOSIS — G629 Polyneuropathy, unspecified: Secondary | ICD-10-CM | POA: Insufficient documentation

## 2015-10-11 DIAGNOSIS — K219 Gastro-esophageal reflux disease without esophagitis: Secondary | ICD-10-CM | POA: Diagnosis not present

## 2015-10-11 DIAGNOSIS — S42292A Other displaced fracture of upper end of left humerus, initial encounter for closed fracture: Secondary | ICD-10-CM | POA: Diagnosis not present

## 2015-10-11 DIAGNOSIS — Z7982 Long term (current) use of aspirin: Secondary | ICD-10-CM | POA: Diagnosis not present

## 2015-10-11 DIAGNOSIS — S42121A Displaced fracture of acromial process, right shoulder, initial encounter for closed fracture: Secondary | ICD-10-CM | POA: Diagnosis not present

## 2015-10-11 DIAGNOSIS — M1990 Unspecified osteoarthritis, unspecified site: Secondary | ICD-10-CM | POA: Diagnosis not present

## 2015-10-11 DIAGNOSIS — Z9889 Other specified postprocedural states: Secondary | ICD-10-CM | POA: Insufficient documentation

## 2015-10-11 DIAGNOSIS — Y998 Other external cause status: Secondary | ICD-10-CM | POA: Insufficient documentation

## 2015-10-11 DIAGNOSIS — Z862 Personal history of diseases of the blood and blood-forming organs and certain disorders involving the immune mechanism: Secondary | ICD-10-CM | POA: Diagnosis not present

## 2015-10-11 DIAGNOSIS — S42202A Unspecified fracture of upper end of left humerus, initial encounter for closed fracture: Secondary | ICD-10-CM

## 2015-10-11 DIAGNOSIS — I129 Hypertensive chronic kidney disease with stage 1 through stage 4 chronic kidney disease, or unspecified chronic kidney disease: Secondary | ICD-10-CM | POA: Insufficient documentation

## 2015-10-11 DIAGNOSIS — E119 Type 2 diabetes mellitus without complications: Secondary | ICD-10-CM | POA: Diagnosis not present

## 2015-10-11 DIAGNOSIS — Z8701 Personal history of pneumonia (recurrent): Secondary | ICD-10-CM | POA: Insufficient documentation

## 2015-10-11 DIAGNOSIS — I509 Heart failure, unspecified: Secondary | ICD-10-CM | POA: Insufficient documentation

## 2015-10-11 DIAGNOSIS — S42212A Unspecified displaced fracture of surgical neck of left humerus, initial encounter for closed fracture: Secondary | ICD-10-CM | POA: Diagnosis not present

## 2015-10-11 DIAGNOSIS — J449 Chronic obstructive pulmonary disease, unspecified: Secondary | ICD-10-CM | POA: Diagnosis not present

## 2015-10-11 DIAGNOSIS — Z79899 Other long term (current) drug therapy: Secondary | ICD-10-CM | POA: Insufficient documentation

## 2015-10-11 DIAGNOSIS — I4891 Unspecified atrial fibrillation: Secondary | ICD-10-CM | POA: Insufficient documentation

## 2015-10-11 DIAGNOSIS — E785 Hyperlipidemia, unspecified: Secondary | ICD-10-CM | POA: Diagnosis not present

## 2015-10-11 DIAGNOSIS — S4992XA Unspecified injury of left shoulder and upper arm, initial encounter: Secondary | ICD-10-CM | POA: Diagnosis present

## 2015-10-11 DIAGNOSIS — Y9389 Activity, other specified: Secondary | ICD-10-CM | POA: Diagnosis not present

## 2015-10-11 DIAGNOSIS — Z7952 Long term (current) use of systemic steroids: Secondary | ICD-10-CM | POA: Insufficient documentation

## 2015-10-11 DIAGNOSIS — Y9289 Other specified places as the place of occurrence of the external cause: Secondary | ICD-10-CM | POA: Diagnosis not present

## 2015-10-11 DIAGNOSIS — Z7951 Long term (current) use of inhaled steroids: Secondary | ICD-10-CM | POA: Diagnosis not present

## 2015-10-11 DIAGNOSIS — W010XXA Fall on same level from slipping, tripping and stumbling without subsequent striking against object, initial encounter: Secondary | ICD-10-CM | POA: Diagnosis not present

## 2015-10-11 DIAGNOSIS — Z85528 Personal history of other malignant neoplasm of kidney: Secondary | ICD-10-CM | POA: Diagnosis not present

## 2015-10-11 DIAGNOSIS — M79632 Pain in left forearm: Secondary | ICD-10-CM | POA: Diagnosis not present

## 2015-10-11 DIAGNOSIS — N183 Chronic kidney disease, stage 3 (moderate): Secondary | ICD-10-CM | POA: Diagnosis not present

## 2015-10-11 MED ORDER — HYDROCODONE-ACETAMINOPHEN 5-325 MG PO TABS
1.0000 | ORAL_TABLET | Freq: Once | ORAL | Status: AC
  Administered 2015-10-11: 1 via ORAL
  Filled 2015-10-11: qty 1

## 2015-10-11 MED ORDER — OXYCODONE HCL 5 MG PO TABS: 5.0000 mg | ORAL_TABLET | Freq: Four times a day (QID) | ORAL | Status: DC | PRN

## 2015-10-11 NOTE — Discharge Instructions (Signed)
Humerus Fracture Treated With Immobilization °The humerus is the large bone in your upper arm. You have a broken (fractured) humerus. These fractures are easily diagnosed with X-rays. °TREATMENT  °Simple fractures which will heal without disability are treated with simple immobilization. Immobilization means you will wear a cast, splint, or sling. You have a fracture which will do well with immobilization. The fracture will heal well simply by being held in a good position until it is stable enough to begin range of motion exercises. Do not take part in activities which would further injure your arm.  °HOME CARE INSTRUCTIONS  °· Put ice on the injured area. °¨ Put ice in a plastic bag. °¨ Place a towel between your skin and the bag. °¨ Leave the ice on for 15-20 minutes, 03-04 times a day. °· If you have a cast: °¨ Do not scratch the skin under the cast using sharp or pointed objects. °¨ Check the skin around the cast every day. You may put lotion on any red or sore areas. °¨ Keep your cast dry and clean. °· If you have a splint: °¨ Wear the splint as directed. °¨ Keep your splint dry and clean. °¨ You may loosen the elastic around the splint if your fingers become numb, tingle, or turn cold or blue. °· If you have a sling: °¨ Wear the sling as directed. °· Do not put pressure on any part of your cast or splint until it is fully hardened. °· Your cast or splint can be protected during bathing with a plastic bag. Do not lower the cast or splint into water. °· Only take over-the-counter or prescription medicines for pain, discomfort, or fever as directed by your caregiver. °· Do range of motion exercises as instructed by your caregiver. °· Follow up as directed by your caregiver. This is very important in order to avoid permanent injury or disability and chronic pain. °SEEK IMMEDIATE MEDICAL CARE IF:  °· Your skin or nails in the injured arm turn blue or gray. °· Your arm feels cold or numb. °· You develop severe pain  in the injured arm. °· You are having problems with the medicines you were given. °MAKE SURE YOU:  °· Understand these instructions. °· Will watch your condition. °· Will get help right away if you are not doing well or get worse. °  °This information is not intended to replace advice given to you by your health care provider. Make sure you discuss any questions you have with your health care provider. °  °Document Released: 09/22/2000 Document Revised: 07/07/2014 Document Reviewed: 11/08/2014 °Elsevier Interactive Patient Education ©2016 Elsevier Inc. ° °

## 2015-10-11 NOTE — ED Notes (Signed)
Pt reports she tripped over her feet and fell onto her left side. No LOC. She reports left arm pain and left back pain. + radial pulse and is able to wiggle her fingers. Denies pain anywhere else.

## 2015-10-11 NOTE — ED Provider Notes (Signed)
CSN: [id number]     Arrival date & time 10/11/15  1744 History   First MD Initiated Contact with Patient 10/11/15 2135     Chief Complaint  Patient presents with  . Fall  . Arm Pain     (Consider location/radiation/quality/duration/timing/severity/associated sxs/prior Treatment) Patient is a 82 y.o. female presenting with fall. The history is provided by the patient.  Fall This is a new problem. The current episode started today. Episode frequency: once. The problem has been unchanged. Associated symptoms include arthralgias (left shoulder). Pertinent negatives include no abdominal pain, change in bowel habit, chest pain, chills, coughing, diaphoresis, fever, headaches, myalgias, nausea, neck pain, numbness, rash, vertigo, visual change, vomiting or weakness. Exacerbated by: moving her injured arm. She has tried nothing for the symptoms. The treatment provided no relief.    Past Medical History  Diagnosis Date  . Anemia     anemia of renal disease  . Diabetes mellitus   . Hypertension   . Chronic kidney disease     Stage III- followed by [facility]  . CHF (congestive heart failure) ([place])     Diastolic dys-- EF 63% july 2011  . Hyperlipidemia   . Arthritis     mutiple joints  . Cancer [facility])     Renal neoplasm- survellience via Urology  . Peripheral neuropathy ([place])   . COPD (chronic obstructive pulmonary disease) ([place])   . DIASTOLIC DYSFUNCTION 3/35/4562    Qualifier: Diagnosis of  By: Buelah Manis MD, Lonell Grandchild    . NEOPLASM, KIDNEY 03/04/2007    complex right renal cyst; Per patient- Urologist no longer following- no changes.      . Venous insufficiency 02/24/2011  . Shortness of breath     Hx: of with exertion  . GERD (gastroesophageal reflux disease)   . H/O hiatal hernia   . Left bundle branch block   . A-fib ([place])   . Pneumonia   . Nonischemic cardiomyopathy [facility])    Past Surgical History  Procedure Laterality Date  . Cholecystectomy  1987  . Cataract extraction w/  intraocular lens  implant, bilateral    . Tonsillectomy    . Abdominal hysterectomy    . Dilation and curettage of uterus    . Lumbar laminectomy/decompression microdiscectomy Left 05/12/2013    Procedure: Left Lumbar four-five laminotomy and microdiskectomy;  Surgeon: Hosie Spangle, MD;  Location: [place] NEUR[facility] ORS;  Service: Neurosurgery;  Laterality: Left;  Left Lumbar four-five laminotomy and microdiskectomy  . Transthoracic echocardiogram  09/2011    EF 60-65%; MV mod-severely calcified, mildly thickened MV annulus, mild MR; LA mod dilated; RA mildly dilated  . Nm myocar perf wall motion  05/2011    lexiscan myoview - fixed septal defect r/t LBBB; normal pattern of perfusion in all regions; low risk scan  . Cardiac catheterization  08/12/2011    heavily calcified L main; LAD with no stenosis; 1st diagonal free of stenosis; L Cfx with no stenosis; OM1/2 without stenosis; PDA without stenosis; RCA without significant stenosis; non-ischemic cardiomyopathy (Dr. Kathy Breach)  . Left heart catheterization with coronary angiogram N/A 08/12/2011    Procedure: LEFT HEART CATHETERIZATION WITH CORONARY ANGIOGRAM;  Surgeon: Pixie Casino, MD;  Location: [facility] CATH LAB;  Service: Cardiovascular;  Laterality: N/A;   Family History  Problem Relation Age of Onset  . Heart disease Mother   . Arthritis Mother    Social History  Substance Use Topics  . Smoking status: Passive Smoke Exposure - Never Smoker  . Smokeless  tobacco: Never Used  . Alcohol Use: No   OB History    No data available     Review of Systems  Constitutional: Negative for fever, chills and diaphoresis.  Respiratory: Negative for cough, chest tightness, shortness of breath and wheezing.   Cardiovascular: Negative for chest pain.  Gastrointestinal: Negative for nausea, vomiting, abdominal pain, diarrhea and change in bowel habit.  Musculoskeletal: Positive for arthralgias (left shoulder). Negative for myalgias, back pain, gait  problem, neck pain and neck stiffness.  Skin: Negative for rash and wound.  Neurological: Negative for dizziness, vertigo, syncope, weakness, light-headedness, numbness and headaches.  Psychiatric/Behavioral: Negative for confusion.  All other systems reviewed and are negative.     Allergies  Review of patient's allergies indicates no known allergies.  Home Medications   Prior to Admission medications   Medication Sig Start Date End Date Taking? Authorizing Provider  albuterol (PROVENTIL HFA;VENTOLIN HFA) 108 (90 Base) MCG/ACT inhaler Inhale 2 puffs into the lungs every 6 (six) hours as needed for wheezing or shortness of breath. 08/08/15   Rosemarie Ax, MD  albuterol (PROVENTIL) (2.5 MG/3ML) 0.083% nebulizer solution inhale contents of 1 vial in nebulizer every 6 hours if needed for wheezing or shortness of breath 09/07/15   Rosemarie Ax, MD  amLODipine (NORVASC) 5 MG tablet Take 1 tablet (5 mg total) by mouth daily. 07/27/15   Pixie Casino, MD  apixaban (ELIQUIS) 2.5 MG TABS tablet Take 1 tablet (2.5 mg total) by mouth 2 (two) times daily. 08/01/15   Pixie Casino, MD  aspirin EC 81 MG tablet Take 81 mg by mouth daily.    Historical Provider, MD  atorvastatin (LIPITOR) 80 MG tablet Take 1 tablet (80 mg total) by mouth at bedtime. 07/31/15   Rosemarie Ax, MD  Blood Glucose Monitoring Suppl (ACCU-CHEK AVIVA PLUS) W/DEVICE KIT use as directed 05/01/15   Rosemarie Ax, MD  calcitRIOL (ROCALTROL) 0.25 MCG capsule take 1 capsule by mouth EVERY MONDAY, [facility] AND FRIDAY 07/31/15   Rosemarie Ax, MD  Calcium Carbonate-Vitamin D (CALCIUM 600+D) 600-200 MG-UNIT TABS Take 1 tablet by mouth daily.     Historical Provider, MD  carvedilol (COREG) 6.25 MG tablet Take 1 tablet by mouth two  times daily with meals 07/31/15   Rosemarie Ax, MD  Chromium-Cinnamon (CINNAMON PLUS CHROMIUM PO) Take 1 capsule by mouth 2 (two) times daily. 64mg/500mg    Historical Provider, MD  CINNAMON PO  Take by mouth daily.    Historical Provider, MD  docusate sodium (COLACE) 100 MG capsule Take 200 mg by mouth 2 (two) times daily.    Historical Provider, MD  doxycycline (VIBRA-TABS) 100 MG tablet Take 1 tablet (100 mg total) by mouth 2 (two) times daily. 09/12/15   WZenia Resides MD  fluticasone (FLONASE) 50 MCG/ACT nasal spray Place 2 sprays into both nostrils daily. 09/27/15   RMariel Aloe MD  furosemide (LASIX) 20 MG tablet Take 1 tablet (20 mg total) by mouth daily. 07/30/15   KPixie Casino MD  gabapentin (NEURONTIN) 300 MG capsule Take one 300 mg capsule nightly. 08/18/14   JRosemarie Ax MD  loratadine (CLARITIN) 10 MG tablet Take 1 tablet (10 mg total) by mouth daily. 09/27/15   RMariel Aloe MD  losartan (COZAAR) 50 MG tablet take 1 tablet by mouth once daily **MUST KEEP APPOINTMENT FOR MORE REFILLS** 09/24/15   KPixie Casino MD  Omega-3 Fatty Acids (FISH OIL) 1000 MG CAPS  Take 1 capsule by mouth 2 (two) times daily.     Historical Provider, MD  omeprazole (PRILOSEC) 20 MG capsule Take 1 capsule (20 mg total) by mouth daily. 08/18/14   Rosemarie Ax, MD  ONE TOUCH ULTRA TEST test strip TEST twice a day FOR SUGAR 03/27/14   Rosemarie Ax, MD  oxyCODONE (ROXICODONE) 5 MG immediate release tablet Take 1 tablet (5 mg total) by mouth every 6 (six) hours as needed for severe pain. 10/11/15   Zenovia Jarred, DO  Polyvinyl Alcohol-Povidone (REFRESH OP) Place 1 drop into both eyes 3 (three) times daily as needed (dry eyes).    Historical Provider, MD  predniSONE (DELTASONE) 50 MG tablet Take 1 tablet (50 mg total) by mouth daily with breakfast. Start today 09/12/15   Zenia Resides, MD  ranitidine (ZANTAC) 150 MG tablet Take 1 tablet (150 mg total) by mouth 2 (two) times daily. 07/31/15   Rosemarie Ax, MD  senna-docusate (SENOKOT-S) 8.6-50 MG per tablet Take 1 tablet by mouth 2 (two) times daily as needed for mild constipation. 11/27/14   Leone Brand, MD  vitamin C (ASCORBIC  ACID) 500 MG tablet Take 500 mg by mouth 2 (two) times daily.    Historical Provider, MD   BP 121/51 mmHg  Pulse 73  Temp(Src) 98.2 F (36.8 C) (Oral)  Resp 16  SpO2 98% Physical Exam  Constitutional: She is oriented to person, place, and time. She appears well-developed and well-nourished. No distress.  HENT:  Head: Normocephalic and atraumatic.  Nose: Nose normal.  Mouth/Throat: Oropharynx is clear and moist.  Eyes: Conjunctivae and EOM are normal. Pupils are equal, round, and reactive to light.  Neck: Neck supple.  Cardiovascular: Normal rate, regular rhythm, normal heart sounds and intact distal pulses.   Pulmonary/Chest: Effort normal and breath sounds normal. She exhibits no tenderness.  Abdominal: Soft. She exhibits no distension. There is no tenderness.  Musculoskeletal: She exhibits tenderness. She exhibits no edema.       Left shoulder: She exhibits decreased range of motion, tenderness, bony tenderness, crepitus and pain. She exhibits no swelling, no deformity, no spasm, normal pulse and normal strength.  No C/T/L spine tenderness  Neurological: She is alert and oriented to person, place, and time. She has normal strength. No cranial nerve deficit or sensory deficit. Coordination normal. GCS eye subscore is 4. GCS verbal subscore is 5. GCS motor subscore is 6.  Skin: Skin is warm and dry. No rash noted. She is not diaphoretic.  Closed fx  Nursing note and vitals reviewed.   ED Course  Procedures (including critical care time) Labs Review Labs Reviewed - No data to display  Imaging Review Dg Forearm Left  10/11/2015  CLINICAL DATA:  Fall onto left arm and shoulder today with pain in the shoulder to wrist. Initial encounter. EXAM: LEFT FOREARM - 2 VIEW COMPARISON:  None. FINDINGS: Essentially only lateral imaging was obtained. Patient was unable to move the arm in the setting of humerus fracture. No fracture deformity or subluxation. Atherosclerosis and osteopenia.  IMPRESSION: Limited study due to patient immobility. No evidence of fracture at the forearm. Electronically Signed   By: Monte Fantasia M.D.   On: 10/11/2015 18:46   Dg Shoulder Left  10/11/2015  CLINICAL DATA:  [place] on LEFT arm and shoulder today, injury, pain LEFT shoulder to wrist, initial encounter EXAM: LEFT SHOULDER - 2+ VIEW COMPARISON:  None FINDINGS: Marked osseous demineralization. AC joint alignment normal. Advanced LEFT  glenohumeral degenerative changes. Displaced fracture at surgical neck of LEFT humerus. No dislocation. Visualized ribs intact. IMPRESSION: Displaced fracture at surgical neck LEFT humerus. Osseous demineralization and glenohumeral degenerative changes but no glenohumeral dislocation. Electronically Signed   By: Lavonia Dana M.D.   On: 10/11/2015 18:47   Dg Humerus Left  10/11/2015  CLINICAL DATA:  [place] on LEFT arm and shoulder today, injury, pain from shoulder to wrist, initial encounter EXAM: LEFT HUMERUS - 2+ VIEW COMPARISON:  LEFT shoulder radiographs 10/11/2015 FINDINGS: Displaced fracture at surgical neck LEFT humerus again identified. LEFT glenohumeral degenerative changes without dislocation. AC joint alignment normal. Remainder of LEFT humerus appears intact. No additional fracture or bone destruction. IMPRESSION: Displaced fracture at surgical neck LEFT humerus. Electronically Signed   By: Lavonia Dana M.D.   On: 10/11/2015 18:48   I have personally reviewed and evaluated these images and lab results as part of my medical decision-making.   EKG Interpretation None      MDM  82 y.o. female on eliquis presents to the ED after she had a mechanical fall while walking down her carpeted hallway, landing on her left shoulder. She denied hitting her head, LOC, nor amnesia of the event. She denies any focal neurologic deficits. Physical exam significant for prominent tenderness to palpation with swelling and subtle crepitus noted at the proximal left humerus.   Neurovascularly intact distal to injury. Neurologically intact with no focal deficits noted. Given reassuring exam with no hx of head injury do not feel that advanced imaging is necessary at this time. She had significant improvement in her pain with one Norco in the ED. X-ray was done and showed a displaced fracture of the surgical neck of the left humerus. Orthopedics was consulted and recommended placing her in a shoulder immobilizer and recommended follow-up in clinic in 1 week. She was prescribed pain medication for further symptomatic relief.  This plan was discussed with the patient at the bedside and she stated both understanding and agreement.     Final diagnoses:  Proximal humerus fracture, left, closed, initial encounter        Zenovia Jarred, DO 10/12/15 0120  Orlie Dakin, MD 10/13/15 [phone]

## 2015-10-11 NOTE — ED Provider Notes (Signed)
Patient tripped and fell in her home 3:45 PM today injuring her left shoulder. No other associated injuries. She denies back pain denies loss of consciousness during her head. On exam alert no distress Glasgow Coma Score 15 HEENT exam was 5 atraumatic neck supple nontender chest is nontender abdomen soft nontender left upper extremity tender at shoulder with soft tissue swelling. Radial pulse 2+. All other extremities a contusion abrasion or tenderness neurovascularly intact X-rays viewed by me  Orlie Dakin, MD 10/11/15 2246

## 2015-10-18 DIAGNOSIS — S42492A Other displaced fracture of lower end of left humerus, initial encounter for closed fracture: Secondary | ICD-10-CM | POA: Diagnosis not present

## 2015-10-23 ENCOUNTER — Other Ambulatory Visit: Payer: Self-pay | Admitting: Internal Medicine

## 2015-10-23 NOTE — Telephone Encounter (Signed)
Rx has been sent to the pharmacy electronically. ° °

## 2015-10-26 ENCOUNTER — Telehealth: Payer: Self-pay | Admitting: Family Medicine

## 2015-10-26 NOTE — Telephone Encounter (Signed)
Return call to patient regarding dizziness.  Patient stated she is having dizziness when she stands up. Dizziness is new onset for the patient. Advised patient when she is getting ready to get up to get slowly. Offered patient an appointment to have orthostatic blood pressures and to evaluated by a provider, declined today.  Patient will call back when she get a ride.  Derl Barrow, RN

## 2015-10-26 NOTE — Telephone Encounter (Signed)
Patient would like to speak to nurse/MD about dizzy spells. Please advise.

## 2015-10-29 ENCOUNTER — Ambulatory Visit: Payer: Medicare Other | Admitting: Family Medicine

## 2015-10-30 ENCOUNTER — Other Ambulatory Visit: Payer: Self-pay | Admitting: Family Medicine

## 2015-10-30 DIAGNOSIS — H02839 Dermatochalasis of unspecified eye, unspecified eyelid: Secondary | ICD-10-CM | POA: Diagnosis not present

## 2015-10-30 DIAGNOSIS — H18413 Arcus senilis, bilateral: Secondary | ICD-10-CM | POA: Diagnosis not present

## 2015-10-30 DIAGNOSIS — E119 Type 2 diabetes mellitus without complications: Secondary | ICD-10-CM | POA: Diagnosis not present

## 2015-10-30 DIAGNOSIS — I10 Essential (primary) hypertension: Secondary | ICD-10-CM | POA: Diagnosis not present

## 2015-11-01 ENCOUNTER — Other Ambulatory Visit: Payer: Self-pay | Admitting: *Deleted

## 2015-11-01 DIAGNOSIS — N2581 Secondary hyperparathyroidism of renal origin: Secondary | ICD-10-CM

## 2015-11-01 DIAGNOSIS — S42482D Torus fracture of lower end of left humerus, subsequent encounter for fracture with routine healing: Secondary | ICD-10-CM | POA: Diagnosis not present

## 2015-11-05 NOTE — Telephone Encounter (Signed)
Unable to leave VM for patient as phone was busy. Will send message to call her and ask the last time she was seen by her nephrologist. If she has not seen them in some time then she will need to come to our clinic in order to have some blood work completed, vitamin D, PTH, CMP.   If any questions then please take the best time and phone number to call and I will try to call her back.   Rosemarie Ax, MD PGY-3, [facility] 11/05/2015, 12:22 PM

## 2015-11-06 NOTE — Telephone Encounter (Signed)
Attempted to reach patient to discuss message below Line repeatedly busy. Velora Heckler, RN

## 2015-11-07 DIAGNOSIS — E1129 Type 2 diabetes mellitus with other diabetic kidney complication: Secondary | ICD-10-CM | POA: Diagnosis not present

## 2015-11-07 DIAGNOSIS — I129 Hypertensive chronic kidney disease with stage 1 through stage 4 chronic kidney disease, or unspecified chronic kidney disease: Secondary | ICD-10-CM | POA: Diagnosis not present

## 2015-11-07 DIAGNOSIS — N2581 Secondary hyperparathyroidism of renal origin: Secondary | ICD-10-CM | POA: Diagnosis not present

## 2015-11-07 DIAGNOSIS — N183 Chronic kidney disease, stage 3 (moderate): Secondary | ICD-10-CM | POA: Diagnosis not present

## 2015-11-07 DIAGNOSIS — D638 Anemia in other chronic diseases classified elsewhere: Secondary | ICD-10-CM | POA: Diagnosis not present

## 2015-11-07 NOTE — Telephone Encounter (Signed)
Patient returned call and states that she had her nephrology appointment today and that they did draw labs.  She also said that she was given medication for her arm pain and that it is not helping.  She would like to know if she can get something stronger. Will forward to MD to make aware. Kordelia Severin,CMA

## 2015-11-08 ENCOUNTER — Telehealth: Payer: Self-pay | Admitting: Family Medicine

## 2015-11-08 MED ORDER — CALCITRIOL 0.25 MCG PO CAPS
ORAL_CAPSULE | ORAL | Status: DC
Start: 2015-11-08 — End: 2016-03-12

## 2015-11-08 NOTE — Telephone Encounter (Signed)
I am covering inbox for Dr Raeford Razor. Received recent conversation that patient had a proximal humerus fracture 09/2015, she was given oxycodone 5mg  #20 tabs by ED 10/11/15, she has since seen nephrologist had labwork, I have refilled her Calcitriol as requested. Per patient via phone to [facility] CMA on 11/07/15, patient was also asking if she could get something stronger for her arm pain, as he prior pain medication is not helping.  I see that she missed an appointment with Dr Raeford Razor on 10/29/15. For stronger pain medicine she would probably have to come in to be seen, >1 month after fracture. Additionally, she should have been established with Orthopedics, and I am not sure if they have prescribed her pain medicine or scheduled follow-up.  Could you call patient back, find out which pain medicine she is referencing as taking (strength, how many times a day, who rx it) and also let her know that stronger pain medicines are controlled substances and she would likely have to be evaluated in clinic for her arm / OR she can follow-up with her orthopedic doctor (as they should probably be involved as well).  Nobie Putnam, [place], PGY-3

## 2015-11-15 DIAGNOSIS — S42492D Other displaced fracture of lower end of left humerus, subsequent encounter for fracture with routine healing: Secondary | ICD-10-CM | POA: Diagnosis not present

## 2015-11-16 ENCOUNTER — Telehealth: Payer: Self-pay | Admitting: Student

## 2015-11-16 NOTE — Telephone Encounter (Signed)
Patient called asking for more oxycodone for her arm pain. She ran out of her medication. She has not been trying any other form of pain control. She was counseled to try tylenol 250-500 mg q4-6 hr as needed for pain. She will call the office to make an appointment if this does not control her pain

## 2015-11-18 ENCOUNTER — Other Ambulatory Visit: Payer: Self-pay | Admitting: Internal Medicine

## 2015-11-20 NOTE — Telephone Encounter (Signed)
Rx(s) sent to pharmacy electronically.  

## 2015-11-22 NOTE — Telephone Encounter (Signed)
Pt contacted and appointment scheduled for 11/27/15 to evaluate the arm pain. Katharina Caper, Marrissa Dai D, [place]

## 2015-11-27 ENCOUNTER — Ambulatory Visit: Payer: Medicare Other | Admitting: Family Medicine

## 2015-11-28 ENCOUNTER — Ambulatory Visit: Payer: Medicare Other | Admitting: Physical Therapy

## 2015-12-02 ENCOUNTER — Other Ambulatory Visit: Payer: Self-pay | Admitting: Internal Medicine

## 2015-12-05 ENCOUNTER — Other Ambulatory Visit: Payer: Self-pay | Admitting: Internal Medicine

## 2015-12-11 ENCOUNTER — Ambulatory Visit: Payer: Medicare Other | Admitting: Physical Therapy

## 2015-12-18 ENCOUNTER — Ambulatory Visit: Payer: Medicare Other | Admitting: Physical Therapy

## 2015-12-20 ENCOUNTER — Ambulatory Visit (INDEPENDENT_AMBULATORY_CARE_PROVIDER_SITE_OTHER): Payer: Medicare Other | Admitting: Family Medicine

## 2015-12-20 VITALS — BP 112/58 | HR 81 | Temp 97.7°F | Ht 67.0 in | Wt 143.6 lb

## 2015-12-20 DIAGNOSIS — K921 Melena: Secondary | ICD-10-CM

## 2015-12-20 DIAGNOSIS — I10 Essential (primary) hypertension: Secondary | ICD-10-CM | POA: Diagnosis not present

## 2015-12-20 DIAGNOSIS — E1142 Type 2 diabetes mellitus with diabetic polyneuropathy: Secondary | ICD-10-CM | POA: Diagnosis not present

## 2015-12-20 LAB — POCT GLYCOSYLATED HEMOGLOBIN (HGB A1C): Hemoglobin A1C: 5.8

## 2015-12-20 NOTE — Progress Notes (Signed)
   Subjective:    [name] - 82 y.o. female MRN [id number]  Date of birth: Aug 05, 1927  CC Dm2  HPI  [name] is here for Dm2.   CHRONIC DIABETES She has been discontinued off her diabetic medications a few months ago.  Disease Monitoring  Blood Sugar Ranges: n/a  Polyuria: no   Visual problems: no   Medication Compliance: n/a  Medication Side Effects  Hypoglycemia: no   Preventitive Health Care  Eye Exam: get eyes checked yearly   Foot Exam: she deferred today   Diet pattern: inconsistent   Exercise: none  Bright red blood per rectum:  Had it happen two times.  She has a history of hemorrhoids.  It was non painful  Has some itching  She is unsure of the amount of blood she saw.   HTN Disease Monitoring: Home BP Monitoring none  Medications: amlodipine, coreg  Chest pain- no     Dyspnea- no Compliance-  Yes .  Lightheadedness-  no   Edema- no  PMH: COPD, HLD, HTN, OA, osteoporosis, DM2, afib on eliquis  SH: no alcohol or tobacco  FH: no history of CAD   Health Maintenance:  Health Maintenance Due  Topic Date Due  . PNA vac Low Risk Adult (2 of 2 - PCV13) 06/30/1998  . OPHTHALMOLOGY EXAM  02/22/2015  . FOOT EXAM  09/07/2015    Review of Systems See HPI     Objective:   Physical Exam BP 112/58 mmHg  Pulse 81  Temp(Src) 97.7 F (36.5 C) (Oral)  Ht 5\' 7"  (1.702 m)  Wt 143 lb 9.6 oz (65.137 kg)  BMI 22.49 kg/m2  SpO2 98% Gen: NAD, alert, cooperative with exam, frail appearing elderly female  HEENT: NCAT, EOMI, clear conjunctiva,  CV: RRR, good S1/S2, no murmur, no edema, capillary refill brisk  Resp: CTABL, no wheezes, non-labored Skin: no rashes, normal turgor  Neuro: no gross deficits.     Assessment & Plan:   Type 2 diabetes mellitus Her Hgb A1c continues to drop which is more likely related to her diet.  Her medications have been stopped for some time.  Unlikely to benefit from A1c monitoring going forward.   HYPERTENSION,  BENIGN SYSTEMIC Controlled.  Will stop the amlodipine today at the risk of hypotension  Will continue with coreg for now.  If there is a concern with fluid status may want to keep the amlodipine and stop the coreg.  Doesn't appear that she has had an MI or HF to warrant the coreg.   Hematochezia Reports minimal amounts of red blood from her rectum.  She denied an exam today She is on Eliquis for afib.  Denied CBC today  Given indications for emergent help and return to clinic.

## 2015-12-20 NOTE — Patient Instructions (Addendum)
Thank you for coming in,   Lets stop the amlodipine which is for your blood pressure.   Please follow up in a nurse clinic to have your blood pressure checked.   Please bring all of your medications with you to each visit.   Health maintenance items that are due.  Health Maintenance  Topic Date Due  . Pneumonia vaccines (2 of 2 - PCV13) 06/30/1998  . Eye exam for diabetics  02/22/2015  . Hemoglobin A1C  03/10/2015  . Complete foot exam   09/07/2015  . Flu Shot  01/29/2016  . Tetanus Vaccine  12/09/2020  . DEXA scan (bone density measurement)  Completed  . Shingles Vaccine  Completed     Sign up for My Chart to have easy access to your labs results, and communication with your Primary care physician   Please feel free to call with any questions or concerns at any time, at [phone]. --Dr. Raeford Razor

## 2015-12-22 ENCOUNTER — Other Ambulatory Visit: Payer: Self-pay | Admitting: Internal Medicine

## 2015-12-24 DIAGNOSIS — K921 Melena: Secondary | ICD-10-CM | POA: Insufficient documentation

## 2015-12-24 NOTE — Telephone Encounter (Signed)
Rx request sent to pharmacy.  

## 2015-12-24 NOTE — Assessment & Plan Note (Addendum)
Reports minimal amounts of red blood from her rectum.  She denied an exam today She is on Eliquis for afib.  Denied CBC today  Given indications for emergent help and return to clinic.

## 2015-12-24 NOTE — Assessment & Plan Note (Signed)
Her Hgb A1c continues to drop which is more likely related to her diet.  Her medications have been stopped for some time.  Unlikely to benefit from A1c monitoring going forward.

## 2015-12-24 NOTE — Assessment & Plan Note (Signed)
Controlled.  Will stop the amlodipine today at the risk of hypotension  Will continue with coreg for now.  If there is a concern with fluid status may want to keep the amlodipine and stop the coreg.  Doesn't appear that she has had an MI or HF to warrant the coreg.

## 2015-12-26 ENCOUNTER — Encounter: Payer: Self-pay | Admitting: Family Medicine

## 2015-12-26 ENCOUNTER — Emergency Department (HOSPITAL_COMMUNITY)
Admission: EM | Admit: 2015-12-26 | Discharge: 2015-12-26 | Disposition: A | Discharge status: Home / Self Care | Payer: Medicare Other | Attending: Emergency Medicine | Admitting: Emergency Medicine

## 2015-12-26 ENCOUNTER — Ambulatory Visit (INDEPENDENT_AMBULATORY_CARE_PROVIDER_SITE_OTHER): Payer: Medicare Other | Admitting: Family Medicine

## 2015-12-26 VITALS — BP 124/62 | HR 90 | Temp 97.6°F | Wt 144.0 lb

## 2015-12-26 DIAGNOSIS — R4189 Other symptoms and signs involving cognitive functions and awareness: Secondary | ICD-10-CM

## 2015-12-26 DIAGNOSIS — E785 Hyperlipidemia, unspecified: Secondary | ICD-10-CM | POA: Insufficient documentation

## 2015-12-26 DIAGNOSIS — K921 Melena: Secondary | ICD-10-CM | POA: Diagnosis not present

## 2015-12-26 DIAGNOSIS — K5900 Constipation, unspecified: Secondary | ICD-10-CM | POA: Insufficient documentation

## 2015-12-26 DIAGNOSIS — Z79899 Other long term (current) drug therapy: Secondary | ICD-10-CM | POA: Insufficient documentation

## 2015-12-26 DIAGNOSIS — Z7722 Contact with and (suspected) exposure to environmental tobacco smoke (acute) (chronic): Secondary | ICD-10-CM | POA: Diagnosis not present

## 2015-12-26 DIAGNOSIS — Z7982 Long term (current) use of aspirin: Secondary | ICD-10-CM | POA: Diagnosis not present

## 2015-12-26 DIAGNOSIS — Z85528 Personal history of other malignant neoplasm of kidney: Secondary | ICD-10-CM | POA: Diagnosis not present

## 2015-12-26 DIAGNOSIS — N183 Chronic kidney disease, stage 3 (moderate): Secondary | ICD-10-CM | POA: Diagnosis not present

## 2015-12-26 DIAGNOSIS — K625 Hemorrhage of anus and rectum: Secondary | ICD-10-CM | POA: Diagnosis not present

## 2015-12-26 DIAGNOSIS — E1122 Type 2 diabetes mellitus with diabetic chronic kidney disease: Secondary | ICD-10-CM | POA: Insufficient documentation

## 2015-12-26 DIAGNOSIS — I509 Heart failure, unspecified: Secondary | ICD-10-CM | POA: Insufficient documentation

## 2015-12-26 DIAGNOSIS — Z7901 Long term (current) use of anticoagulants: Secondary | ICD-10-CM | POA: Insufficient documentation

## 2015-12-26 DIAGNOSIS — I130 Hypertensive heart and chronic kidney disease with heart failure and stage 1 through stage 4 chronic kidney disease, or unspecified chronic kidney disease: Secondary | ICD-10-CM | POA: Insufficient documentation

## 2015-12-26 DIAGNOSIS — J449 Chronic obstructive pulmonary disease, unspecified: Secondary | ICD-10-CM | POA: Insufficient documentation

## 2015-12-26 LAB — BASIC METABOLIC PANEL
Anion gap: 7 (ref 5–15)
BUN: 34 mg/dL — ABNORMAL HIGH (ref 6–20)
CALCIUM: 9.6 mg/dL (ref 8.9–10.3)
CHLORIDE: 108 mmol/L (ref 101–111)
CO2: 26 mmol/L (ref 22–32)
CREATININE: 1.76 mg/dL — AB (ref 0.44–1.00)
GFR, EST AFRICAN AMERICAN: 29 mL/min — AB (ref >60)
GFR, EST NON AFRICAN AMERICAN: 25 mL/min — AB (ref >60)
Glucose, Bld: 111 mg/dL — ABNORMAL HIGH (ref 65–99)
Potassium: 4 mmol/L (ref 3.5–5.1)
SODIUM: 141 mmol/L (ref 135–145)

## 2015-12-26 LAB — HEMOCCULT GUIAC POC 1CARD (OFFICE): FECAL OCCULT BLD: NEGATIVE

## 2015-12-26 LAB — CBC
HCT: 37.5 % (ref 36.0–46.0)
Hemoglobin: 11.8 g/dL — ABNORMAL LOW (ref 12.0–15.0)
MCH: 29.9 pg (ref 26.0–34.0)
MCHC: 31.5 g/dL (ref 30.0–36.0)
MCV: 94.9 fL (ref 78.0–100.0)
Platelets: 227 10*3/uL (ref 150–400)
RBC: 3.95 MIL/uL (ref 3.87–5.11)
RDW: 13.5 % (ref 11.5–15.5)
WBC: 9.4 10*3/uL (ref 4.0–10.5)

## 2015-12-26 MED ORDER — FLEET ENEMA 7-19 GM/118ML RE ENEM
1.0000 | ENEMA | Freq: Once | RECTAL | Status: AC
  Administered 2015-12-26: 1 via RECTAL
  Filled 2015-12-26: qty 1

## 2015-12-26 NOTE — ED Notes (Signed)
Patient here from family practice for constipation. States she was sent here for enema. No distess

## 2015-12-26 NOTE — Patient Instructions (Signed)
I am sending you down to the ER to be evaluated there They will be able to give you an enema to help get your bowels cleaned out They will also do labwork to check your blood counts  I am referring you to a stomach doctor for your bleeding. You will get a phone call to schedule this appointment.   Be well, Dr. Ardelia Mems

## 2015-12-26 NOTE — ED Notes (Signed)
Pt denies concerns with dc 

## 2015-12-26 NOTE — Assessment & Plan Note (Signed)
Difficulty with remembering details of rectal bleeding raises concern for underlying cognitive impairment Recommend MOCA at next office visit with PCP to more objectively evaluate cognitive function.

## 2015-12-26 NOTE — Assessment & Plan Note (Addendum)
Reporting rectal bleeding 3 weeks ago. Hemodynamically stable with benign abdominal exam. Rectal exam significant for hard stool impaction. Has never had colonoscopy, so ruling out colonic neoplasm is important. 10lb weight loss since January noted. Some degree of bleeding potentially attributable to chronic anticoagulation with Eliquis. - After attempting manual disimpaction, small amount of blood noted on stool removed so disimpaction was stopped. - Suspect patient would benefit from administration of enema to manually disimpact, though she is unable to administer this to herself at home & has no family to help her - Discussed options with patient. Recommend going to ER for labwork, KUB to assess extent of impaction, and enema administration. She can then also be monitored there for any further bleeding. If becomes hemodynamically unstable or has further bleeding, would recommend admission for GI consultation. Patient was accompanied to ER by our clinic staff. - Outpatient referral to GI for evaluation & likely colonoscopy placed.

## 2015-12-26 NOTE — ED Provider Notes (Signed)
CSN: [id number]     Arrival date & time 12/26/15  0940 History   First MD Initiated Contact with Patient 12/26/15 1036     Chief Complaint  Patient presents with  . Constipation    HPI Pt went to her doctors office today because she noticed a little bit of blood in her stool.  She is not sure when she first noticed it.  Jeronimo Greaves is not sure if it is just blood or blood mixed with the stool.   No abdominal pain.   No vomiting.  No syncope.  At the office today, she had an exam.  She had a fecal impaction on rectal exam.  They noticed some blood on exam and sent her to the ED to have an enema and blood testing. Past Medical History  Diagnosis Date  . Anemia     anemia of renal disease  . Diabetes mellitus   . Hypertension   . Chronic kidney disease     Stage III- followed by [facility]  . CHF (congestive heart failure) ([place])     Diastolic dys-- EF 13% july 2011  . Hyperlipidemia   . Arthritis     mutiple joints  . Cancer [facility])     Renal neoplasm- survellience via Urology  . Peripheral neuropathy ([place])   . COPD (chronic obstructive pulmonary disease) ([place])   . DIASTOLIC DYSFUNCTION 2/44/0102    Qualifier: Diagnosis of  By: Buelah Manis MD, Lonell Grandchild    . NEOPLASM, KIDNEY 03/04/2007    complex right renal cyst; Per patient- Urologist no longer following- no changes.      . Venous insufficiency 02/24/2011  . Shortness of breath     Hx: of with exertion  . GERD (gastroesophageal reflux disease)   . H/O hiatal hernia   . Left bundle branch block   . A-fib ([place])   . Pneumonia   . Nonischemic cardiomyopathy [facility])    Past Surgical History  Procedure Laterality Date  . Cholecystectomy  1987  . Cataract extraction w/ intraocular lens  implant, bilateral    . Tonsillectomy    . Abdominal hysterectomy    . Dilation and curettage of uterus    . Lumbar laminectomy/decompression microdiscectomy Left 05/12/2013    Procedure: Left Lumbar four-five laminotomy and microdiskectomy;  Surgeon: Hosie Spangle, MD;  Location: [place] [facility];  Service: Neurosurgery;  Laterality: Left;  Left Lumbar four-five laminotomy and microdiskectomy  . Transthoracic echocardiogram  09/2011    EF 60-65%; MV mod-severely calcified, mildly thickened MV annulus, mild MR; LA mod dilated; RA mildly dilated  . Nm myocar perf wall motion  05/2011    lexiscan myoview - fixed septal defect r/t LBBB; normal pattern of perfusion in all regions; low risk scan  . Cardiac catheterization  08/12/2011    heavily calcified L main; LAD with no stenosis; 1st diagonal free of stenosis; L Cfx with no stenosis; OM1/2 without stenosis; PDA without stenosis; RCA without significant stenosis; non-ischemic cardiomyopathy (Dr. Kathy Breach)  . Left heart catheterization with coronary angiogram N/A 08/12/2011    Procedure: LEFT HEART CATHETERIZATION WITH CORONARY ANGIOGRAM;  Surgeon: Pixie Casino, MD;  Location: [facility] CATH LAB;  Service: Cardiovascular;  Laterality: N/A;   Family History  Problem Relation Age of Onset  . Heart disease Mother   . Arthritis Mother    Social History  Substance Use Topics  . Smoking status: Passive Smoke Exposure - Never Smoker  . Smokeless tobacco: Never Used  . Alcohol  Use: No   OB History    No data available     Review of Systems    Allergies  Review of patient's allergies indicates no known allergies.  Home Medications   Prior to Admission medications   Medication Sig Start Date End Date Taking? Authorizing Provider  ACCU-CHEK SOFTCLIX LANCETS lancets TEST once daily 10/30/15  Yes Rosemarie Ax, MD  albuterol (PROVENTIL HFA;VENTOLIN HFA) 108 (90 Base) MCG/ACT inhaler Inhale 2 puffs into the lungs every 6 (six) hours as needed for wheezing or shortness of breath. 08/08/15  Yes Rosemarie Ax, MD  albuterol (PROVENTIL) (2.5 MG/3ML) 0.083% nebulizer solution inhale contents of 1 vial in nebulizer every 6 hours if needed for wheezing or shortness of breath 09/07/15  Yes Rosemarie Ax,  MD  amLODipine (NORVASC) 5 MG tablet take 1 tablet by mouth daily *CONTACT OFFICE FOR ADDITIONAL REFILLS 12/05/15  Yes Pixie Casino, MD  apixaban (ELIQUIS) 2.5 MG TABS tablet Take 1 tablet (2.5 mg total) by mouth 2 (two) times daily. 08/01/15  Yes Pixie Casino, MD  aspirin EC 81 MG tablet Take 81 mg by mouth daily.   Yes Historical Provider, MD  atorvastatin (LIPITOR) 80 MG tablet Take 1 tablet (80 mg total) by mouth at bedtime. 07/31/15  Yes Rosemarie Ax, MD  Blood Glucose Monitoring Suppl (ACCU-CHEK AVIVA PLUS) W/DEVICE KIT use as directed 05/01/15  Yes Rosemarie Ax, MD  calcitRIOL (ROCALTROL) 0.25 MCG capsule take 1 capsule by mouth EVERY MONDAY, [facility] AND FRIDAY 11/08/15  Yes Olin Hauser, DO  Calcium Carbonate-Vitamin D (CALCIUM 600+D) 600-200 MG-UNIT TABS Take 1 tablet by mouth daily.    Yes Historical Provider, MD  carvedilol (COREG) 6.25 MG tablet Take 1 tablet by mouth two  times daily with meals Patient taking differently: Take 6.25 mg by mouth 2 (two) times daily with a meal.  07/31/15  Yes Rosemarie Ax, MD  Chromium-Cinnamon (CINNAMON PLUS CHROMIUM PO) Take 1 capsule by mouth 2 (two) times daily. 43mg/500mg   Yes Historical Provider, MD  docusate sodium (COLACE) 100 MG capsule Take 200 mg by mouth 2 (two) times daily. Reported on 12/20/2015   Yes Historical Provider, MD  furosemide (LASIX) 20 MG tablet take 1 tablet by mouth once daily 12/24/15  Yes KPixie Casino MD  loratadine (CLARITIN) 10 MG tablet Take 1 tablet (10 mg total) by mouth daily. 09/27/15  Yes RMariel Aloe MD  losartan (COZAAR) 50 MG tablet take 1 tablet by mouth once daily **MUST KEEP APPOINTMENT FOR MORE REFILLS** 09/24/15  Yes KPixie Casino MD  Omega-3 Fatty Acids (FISH OIL) 1000 MG CAPS Take 1 capsule by mouth 2 (two) times daily.    Yes Historical Provider, MD  ONE TOUCH ULTRA TEST test strip TEST twice a day FOR SUGAR 03/27/14  Yes JRosemarie Ax MD  oxyCODONE (ROXICODONE) 5 MG  immediate release tablet Take 1 tablet (5 mg total) by mouth every 6 (six) hours as needed for severe pain. 10/11/15  Yes WZenovia Jarred DO  Polyvinyl Alcohol-Povidone (REFRESH OP) Place 1 drop into both eyes 3 (three) times daily as needed (dry eyes).   Yes Historical Provider, MD  senna-docusate (SENOKOT-S) 8.6-50 MG per tablet Take 1 tablet by mouth 2 (two) times daily as needed for mild constipation. 11/27/14  Yes ALeone Brand MD  vitamin C (ASCORBIC ACID) 500 MG tablet Take 500 mg by mouth 2 (two) times daily.   Yes Historical Provider,  MD  doxycycline (VIBRA-TABS) 100 MG tablet Take 1 tablet (100 mg total) by mouth 2 (two) times daily. Patient not taking: Reported on 12/20/2015 09/12/15   Zenia Resides, MD  fluticasone [facility]) 50 MCG/ACT nasal spray Place 2 sprays into both nostrils daily. Patient not taking: Reported on 12/26/2015 09/27/15   Mariel Aloe, MD  gabapentin (NEURONTIN) 300 MG capsule Take one 300 mg capsule nightly. Patient not taking: Reported on 12/26/2015 08/18/14   Rosemarie Ax, MD  omeprazole (PRILOSEC) 20 MG capsule Take 1 capsule (20 mg total) by mouth daily. Patient not taking: Reported on 12/26/2015 08/18/14   Rosemarie Ax, MD  predniSONE (DELTASONE) 50 MG tablet Take 1 tablet (50 mg total) by mouth daily with breakfast. Start today Patient not taking: Reported on 12/26/2015 09/12/15   Zenia Resides, MD  ranitidine (ZANTAC) 150 MG tablet Take 1 tablet (150 mg total) by mouth 2 (two) times daily. Patient not taking: Reported on 12/26/2015 07/31/15   Rosemarie Ax, MD   BP 166/73 mmHg  Pulse 80  Temp(Src) 98 F (36.7 C) (Oral)  Resp 18  Wt 65.318 kg  SpO2 100% Physical Exam  Constitutional: No distress.  HENT:  Head: Normocephalic and atraumatic.  Right Ear: External ear normal.  Left Ear: External ear normal.  Eyes: Conjunctivae are normal. Right eye exhibits no discharge. Left eye exhibits no discharge. No scleral icterus.  Neck: Neck supple. No  tracheal deviation present.  Cardiovascular: Normal rate, regular rhythm and intact distal pulses.   Pulmonary/Chest: Effort normal and breath sounds normal. No stridor. No respiratory distress. She has no wheezes. She has no rales.  Abdominal: Soft. Bowel sounds are normal. She exhibits no distension. There is no tenderness. There is no rebound and no guarding.  Musculoskeletal: She exhibits no edema or tenderness.  Neurological: She is alert. She has normal strength. No cranial nerve deficit (no facial droop, extraocular movements intact, no slurred speech) or sensory deficit. She exhibits normal muscle tone. She displays no seizure activity. Coordination normal.  Skin: Skin is warm and dry. No rash noted. She is not diaphoretic.  Psychiatric: She has a normal mood and affect.  Nursing note and vitals reviewed.   ED Course  Procedures (including critical care time) Labs Review Labs Reviewed  CBC - Abnormal; Notable for the following:    Hemoglobin 11.8 (*)    All other components within normal limits  BASIC METABOLIC PANEL - Abnormal; Notable for the following:    Glucose, Bld 111 (*)    BUN 34 (*)    Creatinine, Ser 1.76 (*)    GFR calc non Af Amer 25 (*)    GFR calc Af Amer 29 (*)    All other components within normal limits      MDM   Final diagnoses:  Constipation, unspecified constipation type    Labs reassuring.  Pt had a BM in the ED and is feeling much better. Doubt GI bleed.  Stable for outpatient follow up    Dorie Rank, MD 12/27/15 1557

## 2015-12-26 NOTE — Progress Notes (Signed)
Date of Visit: 12/26/2015   HPI:  Patient presents for a same day appointment to discuss rectal bleeding.  Reports that a few weeks ago she had an episode of rectal bleeding. She discussed this with her PCP at recent office visit, but wanted a female physician to do the rectal exam.   A few weeks ago some blood dripped out then it stopped. Blood was red. Thinks it occurred just 2 times, though she cannot remember the details fully. May have been dark at one point as well. Denies rectal pain or severe itching, has occasional itching. Does have history of hemorrhoids. No abdominal pain. Has never had a colonoscopy, appears she has refused it whenever offered in the past. Does endorse mild lightehadedness and dizzines that are new. No vaginal bleeding, is sure it came from her rectum. Unsure how often she has a bowel movement, but does report having to strain to stool. Takes eliquis for chronic afib. Thinks it's been 2-3 days since her last bowel movement. No recurrent bleeding since a few weeks ago.  ROS: See HPI  [place]: history of hyperlipidemia, diabetic neuropathy, CKD3, asthma, afib on chronic anticoagulation, type 2 diabetes, hypertension, secondary hyperparathyroidism  PHYSICAL EXAM: BP 124/62 mmHg  Pulse 90  Temp(Src) 97.6 F (36.4 C) (Oral)  Wt 144 lb (65.318 kg) Gen: NAD, pleasant, cooperative HEENT: normocephalic, atraumatic, mmm Heart: regular, no murmur Lungs: clear to auscultation bilaterally, normal work of breathing  Abdomen: soft, nontender to palpation. Normoactive bowel sounds. No masses or organomegaly Neuro: alert. Has trouble remembering details. Rectal: external non-thrombosed hemorrhoid present. No fissures noted. Anoscopy reveals hard impacted stool approximately 4 cm from the anus. No blood in rectal vault or source of bleeding seen. Digital examination reveals slightly decreased rectal tone, but sensation intact & able to bear down. Hard impacted stool palpated and  manual disimpaction attempted. Two segments of stool removed, the second of which had small amount of red blood on it. At this point upon visualizing blood, manual disimpaction was stopped.  ASSESSMENT/PLAN:  Hematochezia Reporting rectal bleeding 3 weeks ago. Hemodynamically stable with benign abdominal exam. Rectal exam significant for hard stool impaction. Has never had colonoscopy, so ruling out colonic neoplasm is important. 10lb weight loss since January noted. Some degree of bleeding potentially attributable to chronic anticoagulation with Eliquis. - After attempting manual disimpaction, small amount of blood noted on stool removed so disimpaction was stopped. - Suspect patient would benefit from administration of enema to manually disimpact, though she is unable to administer this to herself at home & has no family to help her - Discussed options with patient. Recommend going to ER for labwork, KUB to assess extent of impaction, and enema administration. She can then also be monitored there for any further bleeding. If becomes hemodynamically unstable or has further bleeding, would recommend admission for GI consultation. Patient was accompanied to ER by our clinic staff. - Outpatient referral to GI for evaluation & likely colonoscopy placed.  Cognitive impairment Difficulty with remembering details of rectal bleeding raises concern for underlying cognitive impairment Recommend MOCA at next office visit with PCP to more objectively evaluate cognitive function.    FOLLOW UP: Going to ER for further evaluation Referring to GI  [place] J. Ardelia Mems, [place]

## 2015-12-26 NOTE — ED Notes (Signed)
Gave Pt [place] sandwich and a coke. Pt will get dressed after she is done eating.

## 2015-12-26 NOTE — Discharge Instructions (Signed)

## 2015-12-27 ENCOUNTER — Telehealth: Payer: Self-pay | Admitting: Internal Medicine

## 2015-12-27 ENCOUNTER — Encounter: Payer: Self-pay | Admitting: Gastroenterology

## 2015-12-27 ENCOUNTER — Ambulatory Visit: Payer: Medicare Other | Admitting: Family Medicine

## 2015-12-27 ENCOUNTER — Telehealth: Payer: Self-pay | Admitting: Family Medicine

## 2015-12-27 NOTE — Telephone Encounter (Signed)
Pt wants to talk to dr schmitz.  She has a question

## 2015-12-27 NOTE — Telephone Encounter (Signed)
Per pt call she has questions about rectal bleeding she would like a call back please.

## 2015-12-27 NOTE — Telephone Encounter (Signed)
Returned call. Pt seen recently (yesterday) for GI bleed at PCP and ER. She was hemodynamically stable. Note that a GI referral was placed by PCP. We discussed findings, concerns, and when/if pt should consider emergency eval. She is aware to call her PCP or GI to discuss concerns related to rectal bleed. Pt voiced thanks and had no further concerns.

## 2015-12-27 NOTE — Telephone Encounter (Signed)
Spoke with patient about her recent visit. She is unsure if she needs to go to GI. She will determine if she will go in the future.   Rosemarie Ax, MD PGY-3, [facility] 12/27/2015, 11:50 AM

## 2015-12-31 ENCOUNTER — Ambulatory Visit: Payer: Medicare Other | Admitting: Internal Medicine

## 2016-01-02 ENCOUNTER — Telehealth: Payer: Self-pay | Admitting: Internal Medicine

## 2016-01-02 NOTE — Telephone Encounter (Signed)
Patient Name: [name] DOB: 07-30-1927 Initial Comment Caller is having rectal bleeding Nurse Assessment Nurse: Vallery Sa, RN, [name] Date/Time (Eastern Time): 01/02/2016 7:22:49 AM Confirm and document reason for call. If symptomatic, describe symptoms. You must click the next button to save text entered. ---[name] states she developed rectal bleeding about two months ago that she thinks is worse again today. No fever. No pain. Alert and responsive. Has the patient traveled out of the country within the last 30 days? ---No Does the patient have any new or worsening symptoms? ---Yes Will a triage be completed? ---Yes Related visit to physician within the last 2 weeks? ---Yes Does the PT have any chronic conditions? (i.e. diabetes, asthma, etc.) ---Yes List chronic conditions. ---Diabetes in the past, Arthritis, Kidney Disease Is this a behavioral health or substance abuse call? ---No Guidelines Guideline Title Affirmed Question Affirmed Notes Rectal Bleeding Taking Coumadin (warfarin) or other strong blood thinner, or known bleeding disorder (e.g., thrombocytopenia) Final Disposition User Go to ED Now (or PCP triage) Vallery Sa, RN, [name] Referrals [place] REFUSED Disagree/Comply: Disagree Disagree/Comply Reason: Disagree with instructions [name] declined the Go to ER disposition because she states she was just in the ER and needs to follow up with her MD. I tried to schedule a quick appointment, but [name] states she has to call family to arrange transportation and that she will call back.

## 2016-01-03 ENCOUNTER — Ambulatory Visit: Payer: Medicare Other | Attending: Orthopedic Surgery | Admitting: Physical Therapy

## 2016-01-10 DIAGNOSIS — S42492D Other displaced fracture of lower end of left humerus, subsequent encounter for fracture with routine healing: Secondary | ICD-10-CM | POA: Diagnosis not present

## 2016-01-22 ENCOUNTER — Telehealth: Payer: Self-pay | Admitting: Family Medicine

## 2016-01-22 NOTE — Telephone Encounter (Signed)
Attempted to call again NA

## 2016-01-22 NOTE — Telephone Encounter (Signed)
Called home number ot invite to [facility] clinic.  Rang a number of times with NA.  Phone clicked and I left message but not sure if was recorded  Will attempt to call again

## 2016-01-23 NOTE — Telephone Encounter (Signed)
Called no answer

## 2016-01-31 ENCOUNTER — Ambulatory Visit (INDEPENDENT_AMBULATORY_CARE_PROVIDER_SITE_OTHER): Payer: Medicare Other | Admitting: Internal Medicine

## 2016-01-31 ENCOUNTER — Encounter: Payer: Self-pay | Admitting: Internal Medicine

## 2016-01-31 VITALS — BP 104/39 | HR 78 | Temp 98.6°F | Wt 149.0 lb

## 2016-01-31 DIAGNOSIS — Z862 Personal history of diseases of the blood and blood-forming organs and certain disorders involving the immune mechanism: Secondary | ICD-10-CM

## 2016-01-31 DIAGNOSIS — I10 Essential (primary) hypertension: Secondary | ICD-10-CM

## 2016-01-31 DIAGNOSIS — R4189 Other symptoms and signs involving cognitive functions and awareness: Secondary | ICD-10-CM | POA: Diagnosis not present

## 2016-01-31 DIAGNOSIS — F0151 Vascular dementia with behavioral disturbance: Secondary | ICD-10-CM

## 2016-01-31 DIAGNOSIS — F01518 Vascular dementia, unspecified severity, with other behavioral disturbance: Secondary | ICD-10-CM

## 2016-01-31 HISTORY — DX: Vascular dementia, unspecified severity, with other behavioral disturbance: F01.518

## 2016-01-31 HISTORY — DX: Vascular dementia with behavioral disturbance: F01.51

## 2016-01-31 LAB — CBC
HEMATOCRIT: 35.8 % (ref 35.0–45.0)
Hemoglobin: 11.6 g/dL — ABNORMAL LOW (ref 11.7–15.5)
MCH: 30.9 pg (ref 27.0–33.0)
MCHC: 32.4 g/dL (ref 32.0–36.0)
MCV: 95.2 fL (ref 80.0–100.0)
MPV: 9.9 fL (ref 7.5–12.5)
PLATELETS: 209 10*3/uL (ref 140–400)
RBC: 3.76 MIL/uL — ABNORMAL LOW (ref 3.80–5.10)
RDW: 15 % (ref 11.0–15.0)
WBC: 8.7 10*3/uL (ref 3.8–10.8)

## 2016-01-31 LAB — TSH: TSH: 2.92 mIU/L

## 2016-01-31 NOTE — Progress Notes (Addendum)
   [facility] Kerrin Mo, MD Phone: [phone]  Reason For Visit: [facility]   # Goals and Concerns of Patient  1. Patient is concerned about her rectal bleeding and would like further follow-up with this 2. Patient is currently seeing a kidney doctor for her kidney function 3. Patient has had memory loss acutely in over the course of the past 8 months. Would like to have this evaluated further 4. Previously seen by cardiologist. However has not seen him recently in the past year. Would like a new referral   Past Medical History Reviewed problem list.  Medications- reviewed and updated No additions to family history Social history- patient is not a smoker  Objective: BP (!) 104/39 (BP Location: Right Arm, Patient Position: Sitting, Cuff Size: Normal)   Pulse 78   Temp 98.6 F (37 C) (Oral)   Wt 149 lb (67.6 kg)   BMI 23.34 kg/m  Gen: NAD, alert, cooperative with exam HEENT: NCAT Ext: No edema,  Normal gait,  moves UE/LE spontaneously  Assessment/Plan: See problem based a/p  Cognitive impairment - Concern for cognitive decline over the past 8 months  - At next visit will attempt [place] and consider CT imaging  - Blood work today - TSH, CBC, BASIC METABOLIC PANEL WITH GFR, RPR, Vitamin B12, Folate  History of anemia Concern for GI bleed. Currently on Eliqius for A-fib. Will consider stopping if patient hgb low. Check ferritin for iron deficiency to determine chronic vs acute blood loss. Follow up with GI on September 5th. Will follow up with me in the next two weeks.   HYPERTENSION, BENIGN SYSTEMIC Stopped Lasix due low blood pressure. Will consider decreased losartan at follow up in two weeks.   At least 40 minutes was spent counseling patient

## 2016-01-31 NOTE — Assessment & Plan Note (Addendum)
Concern for GI bleed. Currently on Eliqius for A-fib. Will consider stopping if patient hgb low. Check ferritin for iron deficiency to determine chronic vs acute blood loss. Follow up with GI on September 5th. Will follow up with me in the next two weeks.

## 2016-01-31 NOTE — Patient Instructions (Addendum)
It was very nice to meet you. Please follow up with me in the next two weeks.  I want you to stop taking the lasix. I will let you know the result of your labs.

## 2016-01-31 NOTE — Assessment & Plan Note (Signed)
Stopped Lasix due low blood pressure. Will consider decreased losartan at follow up in two weeks.

## 2016-01-31 NOTE — Assessment & Plan Note (Signed)
-   Concern for cognitive decline over the past 8 months  - At next visit will attempt [place] and consider CT imaging  - Blood work today - TSH, CBC, BASIC METABOLIC PANEL WITH GFR, RPR, Vitamin B12, Folate

## 2016-02-01 ENCOUNTER — Encounter: Payer: Self-pay | Admitting: Internal Medicine

## 2016-02-01 ENCOUNTER — Telehealth: Payer: Self-pay | Admitting: Internal Medicine

## 2016-02-01 DIAGNOSIS — L602 Onychogryphosis: Secondary | ICD-10-CM | POA: Diagnosis not present

## 2016-02-01 DIAGNOSIS — L84 Corns and callosities: Secondary | ICD-10-CM | POA: Diagnosis not present

## 2016-02-01 LAB — BASIC METABOLIC PANEL WITH GFR
BUN: 34 mg/dL — AB (ref 7–25)
CALCIUM: 9.2 mg/dL (ref 8.6–10.4)
CO2: 23 mmol/L (ref 20–31)
CREATININE: 1.73 mg/dL — AB (ref 0.60–0.88)
Chloride: 109 mmol/L (ref 98–110)
GFR, EST AFRICAN AMERICAN: 31 mL/min — AB (ref >=60)
GFR, Est Non African American: 27 mL/min — ABNORMAL LOW (ref >=60)
GLUCOSE: 99 mg/dL (ref 65–99)
Potassium: 4.6 mmol/L (ref 3.5–5.3)
Sodium: 144 mmol/L (ref 135–146)

## 2016-02-01 LAB — RPR

## 2016-02-01 LAB — FOLATE: Folate: 9 ng/mL (ref >5.4)

## 2016-02-01 LAB — VITAMIN B12: Vitamin B-12: 373 pg/mL (ref 200–1100)

## 2016-02-01 LAB — FERRITIN: FERRITIN: 321 ng/mL — AB (ref 20–288)

## 2016-02-01 NOTE — Telephone Encounter (Signed)
Pt is confused about what medication she was told to quit taking. Pt also wanted her lab results. Pt was also confused/concerned with her discharge paperwork from yesterday, did not understand what "done today" was for. Please advise. Thank you! ep

## 2016-02-01 NOTE — Telephone Encounter (Signed)
Called patient to let her know that we stopped patient's lasix yesterday. Discussed results of her labs today. Noted that patient's hemoglobin remains stable despite the fact that patient has noted bleeding per rectum. Will continue to follow patient's blood work. GI visit, September 5th.

## 2016-02-18 ENCOUNTER — Ambulatory Visit: Payer: Medicare Other | Admitting: Internal Medicine

## 2016-02-20 DIAGNOSIS — N183 Chronic kidney disease, stage 3 (moderate): Secondary | ICD-10-CM | POA: Diagnosis not present

## 2016-02-20 DIAGNOSIS — N2581 Secondary hyperparathyroidism of renal origin: Secondary | ICD-10-CM | POA: Diagnosis not present

## 2016-02-20 DIAGNOSIS — D638 Anemia in other chronic diseases classified elsewhere: Secondary | ICD-10-CM | POA: Diagnosis not present

## 2016-02-29 ENCOUNTER — Encounter: Payer: Self-pay | Admitting: Gastroenterology

## 2016-02-29 ENCOUNTER — Other Ambulatory Visit: Payer: Self-pay | Admitting: *Deleted

## 2016-02-29 MED ORDER — RANITIDINE HCL 150 MG PO TABS: 150.0000 mg | ORAL_TABLET | Freq: Two times a day (BID) | ORAL | 1 refills | Status: DC

## 2016-03-04 ENCOUNTER — Ambulatory Visit: Payer: Medicare Other | Admitting: Gastroenterology

## 2016-03-12 ENCOUNTER — Other Ambulatory Visit: Payer: Self-pay | Admitting: *Deleted

## 2016-03-12 DIAGNOSIS — N2581 Secondary hyperparathyroidism of renal origin: Secondary | ICD-10-CM

## 2016-03-12 MED ORDER — LOSARTAN POTASSIUM 50 MG PO TABS: ORAL_TABLET | ORAL | 0 refills | Status: DC

## 2016-03-12 MED ORDER — APIXABAN 2.5 MG PO TABS: 2.5000 mg | ORAL_TABLET | Freq: Two times a day (BID) | ORAL | 0 refills | Status: DC

## 2016-03-13 MED ORDER — CALCITRIOL 0.25 MCG PO CAPS: ORAL_CAPSULE | ORAL | 1 refills | Status: DC

## 2016-03-14 ENCOUNTER — Other Ambulatory Visit: Payer: Self-pay | Admitting: *Deleted

## 2016-03-14 MED ORDER — AMLODIPINE BESYLATE 10 MG PO TABS: 10.0000 mg | ORAL_TABLET | Freq: Every day | ORAL | 3 refills | Status: DC

## 2016-03-14 MED ORDER — ATORVASTATIN CALCIUM 80 MG PO TABS: 80.0000 mg | ORAL_TABLET | Freq: Every day | ORAL | 1 refills | Status: DC

## 2016-03-14 MED ORDER — CARVEDILOL 6.25 MG PO TABS: 6.2500 mg | ORAL_TABLET | Freq: Two times a day (BID) | ORAL | 1 refills | Status: DC

## 2016-03-14 NOTE — Telephone Encounter (Signed)
Please call patient to have them make an appointment for a office. I will refill medications, but need them to come in to be seen

## 2016-03-20 ENCOUNTER — Ambulatory Visit (INDEPENDENT_AMBULATORY_CARE_PROVIDER_SITE_OTHER): Payer: Medicare Other | Admitting: Internal Medicine

## 2016-03-20 ENCOUNTER — Encounter: Payer: Self-pay | Admitting: Internal Medicine

## 2016-03-20 VITALS — BP 130/80 | HR 63 | Temp 97.7°F | Ht 67.0 in | Wt 153.4 lb

## 2016-03-20 DIAGNOSIS — K921 Melena: Secondary | ICD-10-CM | POA: Diagnosis not present

## 2016-03-20 DIAGNOSIS — I10 Essential (primary) hypertension: Secondary | ICD-10-CM

## 2016-03-20 DIAGNOSIS — E1142 Type 2 diabetes mellitus with diabetic polyneuropathy: Secondary | ICD-10-CM

## 2016-03-20 LAB — POCT GLYCOSYLATED HEMOGLOBIN (HGB A1C): HEMOGLOBIN A1C: 6.4

## 2016-03-20 MED ORDER — AMLODIPINE BESYLATE 10 MG PO TABS: 10.0000 mg | ORAL_TABLET | Freq: Every day | ORAL | 3 refills | Status: DC

## 2016-03-20 NOTE — Patient Instructions (Addendum)
High Blood Pressure I am going to restart your Norvasc 10 mg daily, I want you to please stop taking lasix.  Your goal is to have a BP average < 150/90   Homework: You need to make an appointment for a physical with me in the next month   Come back to see [place] in: 4 weeks   If you need anything for your hemorrhoids please let me know

## 2016-03-20 NOTE — Progress Notes (Signed)
   [facility] Kerrin Mo, MD Phone: [phone]  Reason For Visit:Work-in/Missed her appointment, HTN and Rectal Bleeding   CHRONIC HTN: Reports: Patient reports only taking Losartan. She has not been taking her Norvasc and is not able to tell me why she stopped it. Patient is currently no Lasix as well. She is taking this medication however she is not sure why she needs this medication. Not currently on her medicine list in the clinic.  Current Meds - Norvasc, Losartan, and Lasix  Reports good compliance, took losartan and lasix today. Tolerating well, w/o complaints. Denies CP, dyspnea, HA, edema, dizziness / lightheadedness  Rectal Bleeding - Indicates having hemorrhoids, chronically  - Ongoing for about 5-6 months per patient  - Intermittently has a couple of drops of blood in her stool, especially when she has constipation.  - No recent rectal bleeding   Past Medical History Reviewed problem list.  Medications- reviewed and updated No additions to family history Social history- patient is a non-smoker  Objective: BP 130/80   Pulse 63   Temp 97.7 F (36.5 C) (Oral)   Ht 5\' 7"  (1.702 m)   Wt 153 lb 6.4 oz (69.6 kg)   BMI 24.03 kg/m  Gen: NAD, alert, cooperative with exam Cardio: regular rate and rhythm, S1S2 heard, no murmurs appreciated Pulm: clear to auscultation bilaterally, no wheezes, rhonchi or rales Skin: dry, intact, no rashes or lesions Neuro: Strength and sensation grossly intact   Assessment/Plan: See problem based a/p

## 2016-03-21 ENCOUNTER — Telehealth: Payer: Self-pay | Admitting: Internal Medicine

## 2016-03-21 NOTE — Telephone Encounter (Signed)
Pt called because she went to pick up her Amlodipine and the pharmacy said that this was canceled. She is not sure why this was canceled. Can we call and check the status. Please inform the patient of the status of her medication. jw

## 2016-03-21 NOTE — Assessment & Plan Note (Addendum)
Chronic HTN, goal 150/90. Initially BP 170/73 > recheck 130/80  - Restarted Norvasc, stopped lasix (no hx of congestive heart failure and no leg swelling )  - Continue Losartan  - Follow up within the next month to see how patient is fairing on this new regiment

## 2016-03-22 NOTE — Telephone Encounter (Signed)
I am not sure what the issue is I gave [name] a printed script for this prescription.  Please tell her she simple needs to give them the script to have it filled

## 2016-03-31 ENCOUNTER — Telehealth: Payer: Self-pay | Admitting: Internal Medicine

## 2016-03-31 NOTE — Telephone Encounter (Signed)
That's ok with me .Marland Kitchen Whatever makes her happy.  Dr. Lemmie Evens

## 2016-03-31 NOTE — Telephone Encounter (Signed)
That is fine 

## 2016-03-31 NOTE — Telephone Encounter (Signed)
New message      Pt want to switch from Dr Debara Pickett to Dr Oval Linsey.  Is this ok with you both?

## 2016-04-03 ENCOUNTER — Ambulatory Visit: Payer: Medicare Other | Admitting: Internal Medicine

## 2016-04-07 ENCOUNTER — Ambulatory Visit: Payer: Medicare Other | Admitting: Internal Medicine

## 2016-04-15 ENCOUNTER — Other Ambulatory Visit: Payer: Self-pay | Admitting: *Deleted

## 2016-04-15 DIAGNOSIS — S329XXA Fracture of unspecified parts of lumbosacral spine and pelvis, initial encounter for closed fracture: Secondary | ICD-10-CM | POA: Diagnosis not present

## 2016-04-15 DIAGNOSIS — J4520 Mild intermittent asthma, uncomplicated: Secondary | ICD-10-CM | POA: Diagnosis not present

## 2016-04-15 MED ORDER — ATORVASTATIN CALCIUM 80 MG PO TABS: 80.0000 mg | ORAL_TABLET | Freq: Every day | ORAL | 1 refills | Status: DC

## 2016-04-18 ENCOUNTER — Ambulatory Visit (INDEPENDENT_AMBULATORY_CARE_PROVIDER_SITE_OTHER): Payer: Medicare Other | Admitting: Internal Medicine

## 2016-04-18 ENCOUNTER — Encounter: Payer: Self-pay | Admitting: Internal Medicine

## 2016-04-18 VITALS — BP 144/77 | HR 77 | Temp 97.8°F | Ht 67.0 in | Wt 151.6 lb

## 2016-04-18 DIAGNOSIS — F0390 Unspecified dementia without behavioral disturbance: Secondary | ICD-10-CM | POA: Diagnosis not present

## 2016-04-18 DIAGNOSIS — L989 Disorder of the skin and subcutaneous tissue, unspecified: Secondary | ICD-10-CM

## 2016-04-18 DIAGNOSIS — I4891 Unspecified atrial fibrillation: Secondary | ICD-10-CM | POA: Diagnosis not present

## 2016-04-18 DIAGNOSIS — E1142 Type 2 diabetes mellitus with diabetic polyneuropathy: Secondary | ICD-10-CM

## 2016-04-18 NOTE — Progress Notes (Deleted)
   [name] Kerrin Mo, MD Phone: [phone]  Reason For Visit:   # Dementia -  -   Past Medical History Reviewed problem list.  Medications- reviewed and updated No additions to family history Social history- patient is a *** smoker  Objective: BP (!) 144/77 (BP Location: Right Arm, Patient Position: Sitting, Cuff Size: Normal)   Pulse 77   Temp 97.8 F (36.6 C) (Oral)   Ht 5\' 7"  (1.702 m)   Wt 151 lb 9.6 oz (68.8 kg)   LMP  (Exact Date)   BMI 23.74 kg/m  Gen: NAD, alert, cooperative with exam HEENT: Normal    Neck: No masses palpated. No lymphadenopathy    Ears: Tympanic membranes intact, normal light reflex, no erythema, no bulging    Eyes: PERRLA, EOMI    Nose: nasal turbinates moist    Throat: moist mucus membranes, no erythema Cardio: regular rate and rhythm, S1S2 heard, no murmurs appreciated Pulm: clear to auscultation bilaterally, no wheezes, rhonchi or rales GI: soft, non-tender, non-distended, bowel sounds present, no hepatomegaly, no splenomegaly GU: external vaginal tissue ***, cervix ***, *** punctate lesions on cervix appreciated, *** discharge from cervical os, *** bleeding, *** cervical motion tenderness, *** abdominal/ adnexal masses Extremities: warm, well perfused, No edema, cyanosis or clubbing;  MSK: Normal gait and station Skin: dry, intact, no rashes or lesions Neuro: Strength and sensation grossly intact   Assessment/Plan: See problem based a/p

## 2016-04-18 NOTE — Patient Instructions (Addendum)
I want you to get a lesion on your neck looked at, this could possibly be a skin cancer. I have provided you the appointment. I want you to try to make an appointment with opthalmology to get your eyes checked. Final I have set up an appointment for your head CT, you will need to have this a done due to your memory loss. You will need to make an appointment for physical in 2 months

## 2016-04-18 NOTE — Progress Notes (Signed)
   [facility] Kerrin Mo, MD Phone: [phone]  Reason For Visit: F/U  # Dementia - MOCHA performed today  # Afib - currently taking Eliquis and Coreg - no concerns. No issues with bleeding. No palpitations, no chest pain, no SOB   # DIABETES Issues: None, diet controlled  Polyuria/phagia/dipsia- none  Visual problems- Has not seen opthalmology recently.   Past Medical History Reviewed problem list.  Medications- reviewed and updated No additions to family history Social history- patient is a non-smoker  Objective: BP (!) 144/77 (BP Location: Right Arm, Patient Position: Sitting, Cuff Size: Normal)   Pulse 77   Temp 97.8 F (36.6 C) (Oral)   Ht 5\' 7"  (1.702 m)   Wt 151 lb 9.6 oz (68.8 kg)   LMP  (Exact Date)   BMI 23.74 kg/m  Gen: NAD, alert, cooperative with exam Cardio: regular rate and rhythm, S1S2 heard, no murmurs appreciated Pulm: clear to auscultation bilaterally, no wheezes, rhonchi or rales Skin: lesion on right upper neck with erythematous base 10 mm x 3 mm, small yellow grow in the middle of this erythematous base.    Assessment/Plan: See problem based a/p  Campath-induced atrial fibrillation Currently on coreg and Eliquis. No issues with either of these medications.   Type 2 diabetes mellitus Diet controlled, last A1C 6.4  - Refused  Foot exam- states she "does not want me looking at her feet" when asked why  - Plans to see ophthalmology  - Continue statin and aspirin  - Follow up yearly   Dementia MOCHA 10- Severe Dementia. Blood work already obtained and normal  - Need to discuss next steps once husband is around, discussed bring him to the next visit  - CT Head order - Follow up 2 months for [facility]    Skin lesion of neck Concern for possibly basal cell vs SK  - Follow up with dermatology

## 2016-04-18 NOTE — Assessment & Plan Note (Signed)
Currently on coreg and Eliquis. No issues with either of these medications.

## 2016-04-21 NOTE — Assessment & Plan Note (Signed)
Concern for possibly basal cell vs SK  - Follow up with dermatology

## 2016-04-21 NOTE — Assessment & Plan Note (Signed)
Diet controlled, last A1C 6.4  - Refused  Foot exam- states she "does not want me looking at her feet" when asked why  - Plans to see ophthalmology  - Continue statin and aspirin  - Follow up yearly

## 2016-04-21 NOTE — Assessment & Plan Note (Signed)
MOCHA 10- Severe Dementia. Blood work already obtained and normal  - Need to discuss next steps once husband is around, discussed bring him to the next visit  - CT Head order - Follow up 2 months for [facility]

## 2016-04-23 ENCOUNTER — Ambulatory Visit (HOSPITAL_COMMUNITY): Payer: Medicare Other

## 2016-04-25 ENCOUNTER — Encounter: Payer: Self-pay | Admitting: Internal Medicine

## 2016-04-25 ENCOUNTER — Ambulatory Visit (INDEPENDENT_AMBULATORY_CARE_PROVIDER_SITE_OTHER): Payer: Medicare Other | Admitting: Internal Medicine

## 2016-04-25 VITALS — BP 143/53 | HR 72 | Temp 97.7°F | Ht 67.0 in | Wt 150.8 lb

## 2016-04-25 DIAGNOSIS — K5900 Constipation, unspecified: Secondary | ICD-10-CM

## 2016-04-25 DIAGNOSIS — K625 Hemorrhage of anus and rectum: Secondary | ICD-10-CM | POA: Diagnosis not present

## 2016-04-25 LAB — POCT HEMOGLOBIN: HEMOGLOBIN: 11 g/dL — AB (ref 12.2–16.2)

## 2016-04-25 MED ORDER — SENNOSIDES-DOCUSATE SODIUM 8.6-50 MG PO TABS: 1.0000 | ORAL_TABLET | Freq: Two times a day (BID) | ORAL | 2 refills | Status: DC | PRN

## 2016-04-25 MED ORDER — DOCUSATE SODIUM 100 MG PO CAPS: 200.0000 mg | ORAL_CAPSULE | Freq: Two times a day (BID) | ORAL | 2 refills | Status: DC

## 2016-04-25 NOTE — Progress Notes (Signed)
   [facility] Phone: [phone]  Subjective:  [name] is an 82 year old female presenting to same day clinic with central abdominal pain for years. She describes the pain as a "hurt" and "cramp". The pain comes and goes. She has not had any nausea, vomiting, or diarrhea. She states she only has 0-1 bowel movements per week. She states that her stools are usually hard. She has to strain to have bowel movements. She has noticed a little bit of blood on the toilet paper when she wipes. She states she has a history of hemorrhoids. She is supposed to be taking Colace bid and Senokot-S bid prn. She has not been taking these medications because she ran out of them.  ROS: See HPI for pertinent positives and negatives  Past Medical History- HTN, COPD, T2DM, dementia, osteoarthritis, osteoporosis, CKD III, HLD  Family history reviewed for today's visit. No changes.  Social history- passive smoke exposure, but no personal tobacco use  Objective: LMP  (Exact Date)  Gen: NAD, alert, cooperative with exam HEENT: NCAT, EOMI, MMM CV: RRR, no murmur Resp: CTABL, no wheezes, normal work of breathing GI: +BS, soft, no tenderness to palpation, non-distended, no guarding or rebound, no masses Rectal exam: Deferred by patient  Assessment/Plan: Constipation Pt presents with central abdominal pain that comes and goes. She has only been having 0-1 BMs per week. Her stools are hard and she is having to strain. She has also noticed a little bit of blood on the toilet paper when she wipes. She has a history of hemorrhoids. She ran out of her stool softeners and cannot remember when was the last time she took them. Abdominal exam is benign today. Pt deferred rectal exam. - Will check Hgb to make sure Pt is not anemic from the rectal bleeding. Sounds like it has just been a small amount. - Refills sent to pharmacy for Colace bid and Senokot bid. - Advised Pt to return to clinic if not  improving.  Hyman Bible, MD PGY-2

## 2016-04-25 NOTE — Assessment & Plan Note (Signed)
Pt presents with central abdominal pain that comes and goes. She has only been having 0-1 BMs per week. Her stools are hard and she is having to strain. She has also noticed a little bit of blood on the toilet paper when she wipes. She has a history of hemorrhoids. She ran out of her stool softeners and cannot remember when was the last time she took them. Abdominal exam is benign today. Pt deferred rectal exam. - Will check Hgb to make sure Pt is not anemic from the rectal bleeding. Sounds like it has just been a small amount. - Refills sent to pharmacy for Colace bid and Senokot bid. - Advised Pt to return to clinic if not improving.

## 2016-04-25 NOTE — Patient Instructions (Signed)
It was so nice to meet you!  Please take Colace 1 tablet twice a day (in the morning and in the evening) and please take Senokot 1 tablet twice a day (in the morning and in the evening). These pills are to help you POOP.  -Dr. Brett Albino

## 2016-05-12 ENCOUNTER — Ambulatory Visit (INDEPENDENT_AMBULATORY_CARE_PROVIDER_SITE_OTHER): Payer: Medicare Other | Admitting: Internal Medicine

## 2016-05-12 ENCOUNTER — Ambulatory Visit: Payer: Medicare Other | Admitting: Internal Medicine

## 2016-05-12 ENCOUNTER — Encounter: Payer: Self-pay | Admitting: Internal Medicine

## 2016-05-12 VITALS — BP 178/60 | HR 75 | Temp 97.6°F | Ht 67.0 in | Wt 150.8 lb

## 2016-05-12 DIAGNOSIS — R51 Headache: Secondary | ICD-10-CM

## 2016-05-12 DIAGNOSIS — I10 Essential (primary) hypertension: Secondary | ICD-10-CM

## 2016-05-12 DIAGNOSIS — R519 Headache, unspecified: Secondary | ICD-10-CM | POA: Insufficient documentation

## 2016-05-12 DIAGNOSIS — G44219 Episodic tension-type headache, not intractable: Secondary | ICD-10-CM

## 2016-05-12 DIAGNOSIS — F0390 Unspecified dementia without behavioral disturbance: Secondary | ICD-10-CM

## 2016-05-12 NOTE — Patient Instructions (Addendum)
I want you to get a head CT. An appointment time is going to be set up by our office and I want to make sure that you go to the hospital [facility] to get this CT. I also want you to follow-up with the geriatric clinic within 1 month. I want you to return for blood pressure recheck in two weeks

## 2016-05-12 NOTE — Progress Notes (Signed)
   [facility] Kerrin Mo, MD Phone: [phone]  Reason For Visit: SDA for headache   [name] is a 82 y.o. female who complains of headaches for about 2-3 months duration. Patient describe the headache as frontal in nature, usually occurring about twice weekly. She is not sure of the duration of the headache, however notes that it usually occurs in the evening.  Description of pain: dull pain. Associated symptoms: Denies any dizziness, facial pain, flashing lights, light sensitivity and loss of vision. Pain relief is obtained by acetaminophen. Precipitating factors: patient is aware of none. She denies a history of recent head injury or fall.  No nausea or vomiting  Prior neurological history: negative for no neurological problems, stroke. Neurologic Review of Systems - no amaurosis, diplopia, abnormal speech, unilateral numbness or weakness, or abnormal speech   Reviewed problem list.  Medications- reviewed and updated No additions to family history Social history- patient is a passive smoker  Objective: BP (!) 178/60   Pulse 75   Temp 97.6 F (36.4 C) (Oral)   Ht '5\' 7"'$  (1.702 m)   Wt 150 lb 12.8 oz (68.4 kg)   LMP  (Exact Date)   BMI 23.62 kg/m  Gen: NAD, alert, cooperative with exam HEENT: Normal    Eyes: PERRLA, EOMI Cardio: regular rate and rhythm, S1S2 heard, no murmurs appreciated Neuro: Strength grossly intact in upper and lower extremities, CN2-12 are intact, 1+ reflexes in upper and lower extremities, intact sensation to light touch, finger to nose test wnls    Assessment/Plan: See problem based a/p  Headache Episodic frontal headache relieved by tylenol, consistent with tension type headache. However new onset in 82 year female will need to rule out  Mass lesion vs. Subacute subdural bleed (currently on eliquis)  Would consider temporal arteritis, however less likely given that lack of localizing pain to the temples. Rechecked blood  pressure during this visit and lower however was not recorded.  - CT w/o contrast for now; previously order to work up dementia - however canceled per patient  - Will need to follow up in two weeks, recheck blood pressure, could consider an ESR if still with complaints of headache (ESR> 50 would be considered positive ) - Continue tylenol for now

## 2016-05-13 ENCOUNTER — Ambulatory Visit: Payer: Medicare Other | Admitting: Gastroenterology

## 2016-05-13 ENCOUNTER — Other Ambulatory Visit: Payer: Self-pay

## 2016-05-14 ENCOUNTER — Ambulatory Visit: Payer: Medicare Other | Admitting: Internal Medicine

## 2016-05-14 NOTE — Assessment & Plan Note (Deleted)
May cons

## 2016-05-14 NOTE — Assessment & Plan Note (Addendum)
Episodic frontal headache relieved by tylenol, consistent with tension type headache. However new onset in 82 year female will need to rule out  Mass lesion vs. Subacute subdural bleed (currently on eliquis)  Would consider temporal arteritis, however less likely given that lack of localizing pain to the temples. Rechecked blood pressure during this visit and lower however was not recorded.  - CT w/o contrast for now; previously order to work up dementia - however canceled per patient  - Will need to follow up in two weeks, recheck blood pressure, could consider an ESR if still with complaints of headache (ESR> 50 would be considered positive ) - Continue tylenol for now   

## 2016-05-19 ENCOUNTER — Ambulatory Visit (HOSPITAL_COMMUNITY): Payer: Medicare Other

## 2016-05-21 DIAGNOSIS — D638 Anemia in other chronic diseases classified elsewhere: Secondary | ICD-10-CM | POA: Diagnosis not present

## 2016-05-21 DIAGNOSIS — N2581 Secondary hyperparathyroidism of renal origin: Secondary | ICD-10-CM | POA: Diagnosis not present

## 2016-05-21 DIAGNOSIS — N183 Chronic kidney disease, stage 3 (moderate): Secondary | ICD-10-CM | POA: Diagnosis not present

## 2016-05-28 DIAGNOSIS — E1129 Type 2 diabetes mellitus with other diabetic kidney complication: Secondary | ICD-10-CM | POA: Diagnosis not present

## 2016-05-28 DIAGNOSIS — I129 Hypertensive chronic kidney disease with stage 1 through stage 4 chronic kidney disease, or unspecified chronic kidney disease: Secondary | ICD-10-CM | POA: Diagnosis not present

## 2016-05-28 DIAGNOSIS — D638 Anemia in other chronic diseases classified elsewhere: Secondary | ICD-10-CM | POA: Diagnosis not present

## 2016-05-28 DIAGNOSIS — N183 Chronic kidney disease, stage 3 (moderate): Secondary | ICD-10-CM | POA: Diagnosis not present

## 2016-05-28 DIAGNOSIS — N2581 Secondary hyperparathyroidism of renal origin: Secondary | ICD-10-CM | POA: Diagnosis not present

## 2016-05-30 ENCOUNTER — Other Ambulatory Visit: Payer: Self-pay | Admitting: Internal Medicine

## 2016-06-05 ENCOUNTER — Encounter: Payer: Self-pay | Admitting: Internal Medicine

## 2016-06-19 DIAGNOSIS — I129 Hypertensive chronic kidney disease with stage 1 through stage 4 chronic kidney disease, or unspecified chronic kidney disease: Secondary | ICD-10-CM | POA: Diagnosis not present

## 2016-07-07 ENCOUNTER — Other Ambulatory Visit: Payer: Self-pay | Admitting: Family Medicine

## 2016-07-07 DIAGNOSIS — N2581 Secondary hyperparathyroidism of renal origin: Secondary | ICD-10-CM

## 2016-07-15 ENCOUNTER — Other Ambulatory Visit: Payer: Self-pay | Admitting: Internal Medicine

## 2016-07-17 ENCOUNTER — Ambulatory Visit: Payer: Medicare Other

## 2016-08-18 ENCOUNTER — Other Ambulatory Visit: Payer: Self-pay | Admitting: Internal Medicine

## 2016-08-21 ENCOUNTER — Ambulatory Visit (INDEPENDENT_AMBULATORY_CARE_PROVIDER_SITE_OTHER): Payer: Medicare Other | Admitting: Internal Medicine

## 2016-08-21 ENCOUNTER — Encounter: Payer: Self-pay | Admitting: Internal Medicine

## 2016-08-21 VITALS — BP 138/56 | HR 72 | Temp 98.0°F | Ht 67.0 in | Wt 156.8 lb

## 2016-08-21 DIAGNOSIS — N183 Chronic kidney disease, stage 3 unspecified: Secondary | ICD-10-CM

## 2016-08-21 DIAGNOSIS — R1032 Left lower quadrant pain: Secondary | ICD-10-CM | POA: Diagnosis not present

## 2016-08-21 DIAGNOSIS — I4891 Unspecified atrial fibrillation: Secondary | ICD-10-CM | POA: Diagnosis not present

## 2016-08-21 DIAGNOSIS — G44219 Episodic tension-type headache, not intractable: Secondary | ICD-10-CM | POA: Diagnosis not present

## 2016-08-21 DIAGNOSIS — I10 Essential (primary) hypertension: Secondary | ICD-10-CM | POA: Diagnosis not present

## 2016-08-21 DIAGNOSIS — Z66 Do not resuscitate: Secondary | ICD-10-CM

## 2016-08-21 LAB — POCT URINALYSIS DIPSTICK
Blood, UA: NEGATIVE
GLUCOSE UA: NEGATIVE
KETONES UA: 15
NITRITE UA: NEGATIVE
Protein, UA: 100
Spec Grav, UA: 1.02
Urobilinogen, UA: 0.2
pH, UA: 5.5

## 2016-08-21 MED ORDER — DOCUSATE SODIUM 100 MG PO CAPS: 200.0000 mg | ORAL_CAPSULE | Freq: Two times a day (BID) | ORAL | 2 refills | Status: DC

## 2016-08-21 MED ORDER — SENNOSIDES-DOCUSATE SODIUM 8.6-50 MG PO TABS: 1.0000 | ORAL_TABLET | Freq: Two times a day (BID) | ORAL | 2 refills | Status: DC | PRN

## 2016-08-21 NOTE — Progress Notes (Signed)
[facility] Kerrin Mo, MD Phone: [phone]  Reason For Visit: Abdominal Pain, Headache, HTN, Afib, CKD   Patient is pleasantly demented, therefore hx is often difficult to obtain, some hx provided by husband.   ABDOMINAL PAIN  Pain began 2-3 months, about once 1 week. Not associated with food that she knows. Patient has been taking Zantac daily. Sometimes has associated reflux  Has not had a good bowel movement in about 1 week  Medications tried:not taking any medication  Similar pain before: patient had this pain previously with constipation  Prior abdominal surgeries:No Does not remember if she has ever had colonoscopy  Symptoms Nausea/vomiting: No Diarrhea: No Constipation: yes  Blood in stool: Sometimes as patient has hemorrhoids  Blood in vomit: no Fever: no Dysuria: no Loss of appetite: no Weight loss: no Vaginal Bleeding: no  Headache   Continues to have headaches.Patient states she has headaches still which occur at least once a week. She takes Tylenol which resolved these headaches.Please see previous note for details on this issue. CT head was previously ordered for patient however she did not go. She states that she will go for her CT this time.   ROS:  Fever/Weight loss None Neurologic Impairment: None  Sudden onset (age > 6) :  Yes, patient without hx of headaches with sudden onset this year Headache awakening from sleep: None Nausea/Vomitting: None Worsening Progression of Headache  None Trauma: None   CHRONIC HTN: Current Meds - Norvasc 10 mg and Cozaar 50 mg Reports good compliance, took meds today. Tolerating well, w/o complaints. Denies CP, dyspnea, HA, edema, dizziness / lightheadedness  AF   No issues with arrhthymias or palpations.  No recent dizziness or falls  Appointment with the heart doctor on Monday   CKD3  - plans to see nephrology this month to establish care   DNR/DNI  - Discussed end of life care,  would like to be DNR/DNI  - Plans to HCPOA filled out with the help her husband    Past Medical History Reviewed problem list.  Medications- reviewed and updated No additions to family history Social history- patient is a non- smoker  Objective: BP (!) 138/56   Pulse 72   Temp 98 F (36.7 C) (Oral)   Ht 5\' 7"  (1.702 m)   Wt 156 lb 12.8 oz (71.1 kg)   SpO2 97%   BMI 24.56 kg/m  Gen: NAD, alert, cooperative with exam HEENT: Normal    Neck: No masses palpated. No lymphadenopathy Cardio: regular rate and rhythm, S1S2 heard, no murmurs appreciated Pulm: clear to auscultation bilaterally, no wheezes, rhonchi or rales GI: soft, BS+, slightly tender to palpation in left lower quadrant. No masses felt  Extremities: warm, well perfused, No edema, cyanosis or clubbing;   Assessment/Plan: See problem based a/p  Abdominal pain, left lower quadrant Slight tenderness along the left lower quadrant chronic in nature over the last 2-3 months, patient with hx of chronic constipation likely the cause of abdominal pain. However some concern for ovarian mass given patient's age. Less concerning, possibly diverticulitis, however MRI abdomen without diverticulosis  noted in 2010. No concern for acute abdomen given patient exam and chronicity  - Will get a pelvic transvaginal/abdominal ultrasound  - Will provide Senakot and colace  - Follow up in two weeks following imaging   HYPERTENSION, BENIGN SYSTEMIC Well controlled,  Continue Norvasc and Cozaar  RENAL DISEASE, CHRONIC, STAGE III Stable  Plans to follow up with nephrology next  week   Headache Likely tension headache as noted before. Patient refused to get head CT at last visit. However, now patient is interested in getting head CT to determine if there are any masses. Red flags include age of onset of headache, also patient is on eliquis, though no hx of fall associated with onset of headache  - Continue tylenol as needed for headache  -  Reorder head CT   - Follow up in two weeks following imaging   Campath-induced atrial fibrillation Continue Eliquis and Coreg  May need to discuss as dementia progresses whether continue this as patient does report hx of falls

## 2016-08-21 NOTE — Patient Instructions (Addendum)
Please get your imaging for your company and your head next week on Wednesday at 2:30 at [facility]: Address [address], [place], [place] [address]. Please follow up in two weeks following this imaging. Please restart sennakot and colace for possible constipation.

## 2016-08-22 ENCOUNTER — Other Ambulatory Visit: Payer: Self-pay | Admitting: Internal Medicine

## 2016-08-22 DIAGNOSIS — Z66 Do not resuscitate: Secondary | ICD-10-CM | POA: Insufficient documentation

## 2016-08-22 NOTE — Assessment & Plan Note (Signed)
Well controlled,  Continue Norvasc and Cozaar

## 2016-08-22 NOTE — Assessment & Plan Note (Addendum)
Likely tension headache as noted before. Patient refused to get head CT at last visit. However, now patient is interested in getting head CT to determine if there are any masses. Red flags include age of onset of headache, also patient is on eliquis, though no hx of fall associated with onset of headache  - Continue tylenol as needed for headache  - Reorder head CT   - Follow up in two weeks following imaging

## 2016-08-22 NOTE — Assessment & Plan Note (Signed)
Slight tenderness along the left lower quadrant chronic in nature over the last 2-3 months, patient with hx of chronic constipation likely the cause of abdominal pain. However some concern for ovarian mass given patient's age. Less concerning, possibly diverticulitis, however MRI abdomen without diverticulosis  noted in 2010. No concern for acute abdomen given patient exam and chronicity  - Will get a pelvic transvaginal/abdominal ultrasound  - Will provide Senakot and colace  - Follow up in two weeks following imaging

## 2016-08-22 NOTE — Assessment & Plan Note (Signed)
Stable  Plans to follow up with nephrology next week

## 2016-08-22 NOTE — Assessment & Plan Note (Signed)
Continue Eliquis and Coreg  May need to discuss as dementia progresses whether continue this as patient does report hx of falls

## 2016-08-25 DIAGNOSIS — E1129 Type 2 diabetes mellitus with other diabetic kidney complication: Secondary | ICD-10-CM | POA: Diagnosis not present

## 2016-08-25 DIAGNOSIS — N183 Chronic kidney disease, stage 3 (moderate): Secondary | ICD-10-CM | POA: Diagnosis not present

## 2016-08-25 DIAGNOSIS — D638 Anemia in other chronic diseases classified elsewhere: Secondary | ICD-10-CM | POA: Diagnosis not present

## 2016-08-25 DIAGNOSIS — I129 Hypertensive chronic kidney disease with stage 1 through stage 4 chronic kidney disease, or unspecified chronic kidney disease: Secondary | ICD-10-CM | POA: Diagnosis not present

## 2016-08-25 DIAGNOSIS — N2581 Secondary hyperparathyroidism of renal origin: Secondary | ICD-10-CM | POA: Diagnosis not present

## 2016-08-27 ENCOUNTER — Ambulatory Visit (HOSPITAL_COMMUNITY): Payer: Medicare Other

## 2016-09-04 ENCOUNTER — Telehealth: Payer: Self-pay | Admitting: Internal Medicine

## 2016-09-04 NOTE — Telephone Encounter (Signed)
Pt had to cancel previous CT and u/s and would like to reschedule. Pt would like to do them on the same day between 2-3pm. ep

## 2016-09-10 ENCOUNTER — Telehealth: Payer: Self-pay | Admitting: *Deleted

## 2016-09-10 NOTE — Telephone Encounter (Signed)
Patient and husband called back regarding [name]'s cancelled imaging appointment on 08/27/16 at [facility].  She was supposed to have a CT head and also an ultrasound.  Patient states that she doesn't have a preference on where or when.  Please call husband back with time and date for this appointment.  Jazmin Hartsell,CMA

## 2016-09-12 NOTE — Telephone Encounter (Signed)
appt now scheduled for 09-18-16. [name],CMA

## 2016-09-18 ENCOUNTER — Ambulatory Visit (HOSPITAL_COMMUNITY): Admission: RE | Admit: 2016-09-18 | Payer: Medicare Other | Source: Ambulatory Visit

## 2016-09-18 ENCOUNTER — Ambulatory Visit (HOSPITAL_COMMUNITY): Payer: Medicare Other

## 2016-09-23 ENCOUNTER — Ambulatory Visit (HOSPITAL_COMMUNITY): Payer: Medicare Other

## 2016-09-26 ENCOUNTER — Ambulatory Visit (HOSPITAL_COMMUNITY): Payer: Medicare Other

## 2016-10-03 ENCOUNTER — Ambulatory Visit (HOSPITAL_COMMUNITY): Payer: Medicare Other

## 2016-10-08 ENCOUNTER — Other Ambulatory Visit: Payer: Self-pay | Admitting: *Deleted

## 2016-10-08 DIAGNOSIS — J302 Other seasonal allergic rhinitis: Secondary | ICD-10-CM

## 2016-10-08 MED ORDER — LORATADINE 10 MG PO TABS: 10.0000 mg | ORAL_TABLET | Freq: Every day | ORAL | 11 refills | Status: DC

## 2016-10-15 ENCOUNTER — Ambulatory Visit (HOSPITAL_COMMUNITY)
Admission: RE | Admit: 2016-10-15 | Discharge: 2016-10-15 | Disposition: A | Discharge status: Home / Self Care | Payer: Medicare Other | Source: Ambulatory Visit | Attending: Family Medicine | Admitting: Family Medicine

## 2016-10-15 DIAGNOSIS — R51 Headache: Secondary | ICD-10-CM | POA: Diagnosis not present

## 2016-10-15 DIAGNOSIS — R1032 Left lower quadrant pain: Secondary | ICD-10-CM

## 2016-10-15 DIAGNOSIS — N83291 Other ovarian cyst, right side: Secondary | ICD-10-CM | POA: Diagnosis not present

## 2016-10-15 DIAGNOSIS — G44219 Episodic tension-type headache, not intractable: Secondary | ICD-10-CM | POA: Diagnosis not present

## 2016-10-16 ENCOUNTER — Telehealth: Payer: Self-pay | Admitting: Internal Medicine

## 2016-10-16 DIAGNOSIS — S52501A Unspecified fracture of the lower end of right radius, initial encounter for closed fracture: Secondary | ICD-10-CM | POA: Diagnosis not present

## 2016-10-16 NOTE — Telephone Encounter (Signed)
Enter in error

## 2016-10-16 NOTE — Telephone Encounter (Signed)
Called patient to let her and her husband know the results of the imaging. 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 Ultrasound consistent with 3 cm simple cyst - recommendation for yearly surveillance. Patient plans to come in 1 week to discuss plans with husband.

## 2016-10-22 ENCOUNTER — Ambulatory Visit: Payer: Medicare Other | Admitting: Internal Medicine

## 2016-10-22 DIAGNOSIS — S52501D Unspecified fracture of the lower end of right radius, subsequent encounter for closed fracture with routine healing: Secondary | ICD-10-CM | POA: Diagnosis not present

## 2016-10-24 ENCOUNTER — Ambulatory Visit (INDEPENDENT_AMBULATORY_CARE_PROVIDER_SITE_OTHER): Payer: Medicare Other | Admitting: Internal Medicine

## 2016-10-24 ENCOUNTER — Encounter: Payer: Self-pay | Admitting: Internal Medicine

## 2016-10-24 VITALS — BP 140/78 | HR 58 | Temp 98.2°F | Wt 155.0 lb

## 2016-10-24 DIAGNOSIS — N83201 Unspecified ovarian cyst, right side: Secondary | ICD-10-CM | POA: Diagnosis not present

## 2016-10-24 DIAGNOSIS — R296 Repeated falls: Secondary | ICD-10-CM

## 2016-10-24 DIAGNOSIS — I4891 Unspecified atrial fibrillation: Secondary | ICD-10-CM

## 2016-10-24 DIAGNOSIS — N83209 Unspecified ovarian cyst, unspecified side: Secondary | ICD-10-CM

## 2016-10-24 NOTE — Patient Instructions (Signed)
You will need a ultrasound in one year to recheck on your ovary. I want you to follow up in a couple months for your diabetes, we can hold off checking your A1c today.

## 2016-10-28 DIAGNOSIS — S52501D Unspecified fracture of the lower end of right radius, subsequent encounter for closed fracture with routine healing: Secondary | ICD-10-CM | POA: Diagnosis not present

## 2016-10-28 DIAGNOSIS — N83209 Unspecified ovarian cyst, unspecified side: Secondary | ICD-10-CM | POA: Insufficient documentation

## 2016-10-28 NOTE — Assessment & Plan Note (Addendum)
Patient with history of A. Fib - HASBLED score 3, CHADVASC score 6 ; patient with recent history of fall, severe significant dementia is still able to function at home with her husband help. Discussed the Xarelto today with the patient and her husband- with weighing the benefits and risks of taking a blood thinner given her history and her age. - Patient would like to continue Eliquis  - Continue Coreg 6.125  Mg  - Would consider decreasing coreg at next visit give patient's pulse

## 2016-10-28 NOTE — Assessment & Plan Note (Signed)
Right ovary 3 cm simple appearing cyst. -  Recommendation to have surveillance on this cyst annual  -  Patient/Husband would like to continue annual surveillance

## 2016-10-28 NOTE — Progress Notes (Signed)
   [name] Kerrin Mo, MD Phone: [phone]  Reason For Visit:  F/U for Afib and Ovarian Cyst - review recent imaging results with patient and husband   - Discussed risks and benefits of eliquis given history of recent fall and age  - Discussed results of CT head and pelvic ultrasound   Past Medical History Reviewed problem list.  Medications- reviewed and updated No additions to family history Social history- patient is a non- smoker  Objective: BP 140/78   Pulse (!) 58   Temp 98.2 F (36.8 C) (Oral)   Wt 155 lb (70.3 kg)   BMI 24.28 kg/m  Gen: NAD, alert, cooperative with exam Cardio: regular rate and rhythm, S1S2 heard, no murmurs appreciated Pulm: clear to auscultation bilaterally, no wheezes, rhonchi or rales GI: soft, non-tender, non-distended, bowel sounds present, no hepatomegaly, no splenomegaly Skin: dry, intact, no rashes or lesions  Assessment/Plan: See problem based a/p  Campath-induced atrial fibrillation Patient with history of A. Fib - HASBLED score 3, CHADVASC score 6 ; patient with recent history of fall, severe significant dementia is still able to function at home with her husband help. Discussed the Xarelto today with the patient and her husband- with weighing the benefits and risks of taking a blood thinner given her history and her age. - Patient would like to continue Eliquis  - Continue Coreg 6.125  Mg  - Would consider decreasing coreg at next visit give patient's pulse    Ovarian cyst Right ovary 3 cm simple appearing cyst. -  Recommendation to have surveillance on this cyst annual  -  Patient/Husband would like to continue annual surveillance

## 2016-11-03 DIAGNOSIS — S52501D Unspecified fracture of the lower end of right radius, subsequent encounter for closed fracture with routine healing: Secondary | ICD-10-CM | POA: Diagnosis not present

## 2016-11-12 ENCOUNTER — Other Ambulatory Visit: Payer: Self-pay | Admitting: Internal Medicine

## 2016-11-26 ENCOUNTER — Ambulatory Visit: Payer: Medicare Other | Admitting: Internal Medicine

## 2016-11-28 DIAGNOSIS — S52501D Unspecified fracture of the lower end of right radius, subsequent encounter for closed fracture with routine healing: Secondary | ICD-10-CM | POA: Diagnosis not present

## 2016-12-10 DIAGNOSIS — I129 Hypertensive chronic kidney disease with stage 1 through stage 4 chronic kidney disease, or unspecified chronic kidney disease: Secondary | ICD-10-CM | POA: Diagnosis not present

## 2016-12-10 DIAGNOSIS — E1129 Type 2 diabetes mellitus with other diabetic kidney complication: Secondary | ICD-10-CM | POA: Diagnosis not present

## 2016-12-10 DIAGNOSIS — D638 Anemia in other chronic diseases classified elsewhere: Secondary | ICD-10-CM | POA: Diagnosis not present

## 2016-12-10 DIAGNOSIS — N183 Chronic kidney disease, stage 3 (moderate): Secondary | ICD-10-CM | POA: Diagnosis not present

## 2016-12-10 DIAGNOSIS — N2581 Secondary hyperparathyroidism of renal origin: Secondary | ICD-10-CM | POA: Diagnosis not present

## 2016-12-12 ENCOUNTER — Telehealth: Payer: Self-pay | Admitting: Internal Medicine

## 2016-12-12 DIAGNOSIS — J441 Chronic obstructive pulmonary disease with (acute) exacerbation: Secondary | ICD-10-CM

## 2016-12-12 DIAGNOSIS — I10 Essential (primary) hypertension: Secondary | ICD-10-CM

## 2016-12-12 DIAGNOSIS — F0390 Unspecified dementia without behavioral disturbance: Secondary | ICD-10-CM

## 2016-12-12 DIAGNOSIS — R4189 Other symptoms and signs involving cognitive functions and awareness: Secondary | ICD-10-CM

## 2016-12-12 NOTE — Telephone Encounter (Signed)
Discussed with Mr. Cobos concern about patient ability to be taken care off with severe dementia. Made an appointment with Geriatric clinic on June 28th for further resources.

## 2016-12-14 ENCOUNTER — Other Ambulatory Visit: Payer: Self-pay | Admitting: Internal Medicine

## 2016-12-17 ENCOUNTER — Telehealth: Payer: Self-pay | Admitting: Internal Medicine

## 2016-12-17 NOTE — Telephone Encounter (Signed)
[name] does not see a need for skilled nursing visits, but if pt has other health concerns after next appointment with PCP she would be happy to see pt again with a new referral. ep

## 2016-12-18 NOTE — Telephone Encounter (Signed)
FYI to PCP

## 2016-12-25 ENCOUNTER — Ambulatory Visit (INDEPENDENT_AMBULATORY_CARE_PROVIDER_SITE_OTHER): Payer: Medicare Other | Admitting: Family Medicine

## 2016-12-25 VITALS — BP 124/70 | HR 62 | Temp 99.2°F | Ht 67.0 in | Wt 141.0 lb

## 2016-12-25 DIAGNOSIS — I639 Cerebral infarction, unspecified: Secondary | ICD-10-CM

## 2016-12-25 DIAGNOSIS — R4182 Altered mental status, unspecified: Secondary | ICD-10-CM

## 2016-12-25 DIAGNOSIS — H9193 Unspecified hearing loss, bilateral: Secondary | ICD-10-CM

## 2016-12-25 DIAGNOSIS — H543 Unqualified visual loss, both eyes: Secondary | ICD-10-CM

## 2016-12-25 DIAGNOSIS — Z7409 Other reduced mobility: Secondary | ICD-10-CM | POA: Diagnosis not present

## 2016-12-25 DIAGNOSIS — F0151 Vascular dementia with behavioral disturbance: Secondary | ICD-10-CM

## 2016-12-25 DIAGNOSIS — N2581 Secondary hyperparathyroidism of renal origin: Secondary | ICD-10-CM

## 2016-12-25 DIAGNOSIS — E119 Type 2 diabetes mellitus without complications: Secondary | ICD-10-CM

## 2016-12-25 DIAGNOSIS — R7989 Other specified abnormal findings of blood chemistry: Secondary | ICD-10-CM

## 2016-12-25 DIAGNOSIS — F01518 Vascular dementia, unspecified severity, with other behavioral disturbance: Secondary | ICD-10-CM

## 2016-12-25 DIAGNOSIS — R296 Repeated falls: Secondary | ICD-10-CM

## 2016-12-25 DIAGNOSIS — R32 Unspecified urinary incontinence: Secondary | ICD-10-CM

## 2016-12-25 DIAGNOSIS — I4891 Unspecified atrial fibrillation: Secondary | ICD-10-CM

## 2016-12-25 DIAGNOSIS — Z79899 Other long term (current) drug therapy: Secondary | ICD-10-CM

## 2016-12-25 DIAGNOSIS — D649 Anemia, unspecified: Secondary | ICD-10-CM | POA: Diagnosis not present

## 2016-12-25 DIAGNOSIS — R634 Abnormal weight loss: Secondary | ICD-10-CM | POA: Diagnosis not present

## 2016-12-25 DIAGNOSIS — E538 Deficiency of other specified B group vitamins: Secondary | ICD-10-CM

## 2016-12-25 DIAGNOSIS — J441 Chronic obstructive pulmonary disease with (acute) exacerbation: Secondary | ICD-10-CM

## 2016-12-25 NOTE — Progress Notes (Addendum)
[facility]:   Patient is accompanied by:  husband Primary caregiver: husband Patient's lives with their spouse. Patient information was obtained from patient, spouse/SO and past medical records. History/Exam limitations: dementia. Primary Care Provider: Tonette Bihari, MD Referring provider: Kerrin Mo, MD Reason for referral:  Chief Complaint  Patient presents with  . Memory Loss    History Chief Complaint  Patient presents with  . Memory Loss    HPI by problems:   Cognitive impairment concern What problems with thinking are there?  memory loss and slow information processing  When were the changes first noticed?  several month ago  Did this change occur abruptly or gradually?  gradual  How have the changes progressed since then?  gradually worsening  Does their level of alertness change throughout the day?  no  Is their speech disorganized, rambling?  no, though has to be redirected from a tangent back to topic of a conversation  Has there been any tremors or abnormal movements?  no  Have they had in hallucinations or delusions:  Yes, patient tells husband of seeing people that she does not recognize.  They are not threatening nor scary. She does sometimes believe that others are stealing from her when she loses her personal effects.    Have they appeared more anxious or sad lately?  no  Do they still have interests or activities they enjor doing?  yes, she enjoys getting out for trips. She enjoys talking with others on telephone.  She enjoys watching game shows and TV series.  How has their appetite been lately?  are worsening  How has their sleep been lately?  She has frequent nightime awakenings with wandering about the home "checking" things.  She does not go outside of her home unaccompanied. She is sleeping during day more and more.  Problem behaviors:  suspiciousness and hallucinations   Compared to 5 to  10 years ago, how is the patient at:  Problems with Judgment, e.g., problem making decisions, bad financial decisions, problems with thinking? are worsening. Forgetting her husband's name and who he is at times.  She does not become frightened of him when she does not recognize him.   Less interested in hobbies or previously enjoyed activities? show no change   Trouble remembering appointments? are worsening    Remembering things about family and friends e.g. names,  occupations, birthdays, addresses?  are worsening  Remembering things that have happened recently?  are worsening  Recalling conversations a few days later?  are worsening  Remembering what day and month it is? are worsening  Remembering where things are usually kept?  are worsening  Losing things?  are worsening  Learning to use a new gadget or machine around the house,e.g., computer, microwave, remote control?  are worsening  Learning new things in general?  are worsening  Following a story in a book or on TV?  show no change  Handling money for shopping?   are worsening  Handling financial matters, e.g. their pension,  dealing with the bank?  are worsening  Able to cope with unexpected events?  show no change  Getting lost?  show no change  Asking same questions repeatedly or telling  the same story repeatedly to the same person(s)?  yes   Unintentional Weight Loss in Older Adults  Diet Type: general   Average meal intake, recent:declining amount   Fraility: Yes    Medication effects (See dysgeusia ADE; Abxs, AED, topiramate, Antipsychotics, benzodiazapines,  Digoxin, levodopa, metformin, opiates, SS/SNRI, theophylline, caffeine): No   Level of physical activity: wandering at night within home;  assisted more than 50%  Polypharmacy (> 4 medications): Yes   Malignancy Hx*:  Kidney complex cyst 2008 - urology no longer following recent diagnosis of ovarian cyst   Ongoing  inflammatory or increased catabolic condition: No   Recent acute Illness: Yes, Fall with left forearm fracture within this year per husband though unable to find in pt's record  Emotional problems, especially depression*: No   Alcoholism / Substance Abuse: No   Late-life paranoia: No   Swallowing disorders: No (Meds associated with dysphagia or dry mouth: Anticholinergics, antihistamines, clonidine, Loop diuretics, Bisphosphonates, Doxycycline, iron, NSAIDs, potassium)   Dysgeusia:No   Oral factors (e.g., poorly fitting dentures, caries): wears upper plate dentures that reported as fitting well.  Missing group of left lower incisiors thru anterior molars on left side  Food insecurity: No   Hyperthyroidism, hypothyroidism, hyperparathyroidism/hypercalcemia, hypoadrenalism: iPTH (2016) 91 (H)  TSH (08/17) 2.92,  Normal serum Na+ and K+.   GI Disorders Hx: Constipation  Nausea and/or Vomiting: No   GI/Biliary surgeries: s/p hysterectomy  Eating problems (e.g., inability to feed self): No   Dental or denture problems: missing right sided mandibular teeth  Low-salt, low-cholesterol diet: No   Stones, social problems (e.g., isolation, inability to obtain preferred foods): No   Cognitive impairment*: Yes   Immunocompromised: No   Diabetes:No   Organ Failure (Cardiac, Respiratory, Renal, Liver): CKD IIIb/A2   Autoimmune Disorders (RA, SLE, etc): No    Neurologic Conditions (Stroke, Parkinsons, Chronic Pain, Dementia): Yes. Old cerebellar infarct   Symptoms:  General Thirst: No  Fever: No  Night Sweats:No   Rigors: No  Fatigue: No    Cardiovascular Abdominal pain with eating: No  Heart Failure Hx: No  Respiratory Pulmonary Disease Hx: No  Cough: No  Gastrointestinal Indigestion/heartburn: No  Epigastric Pain: No  Hematemesis: No  Nausea and/or Vomiting: No  Melena: No  Hematochezia: No  Abdomina Swelling: No  Bloating: No  Diarrhea: No  Constipation: Yes   Genitourinary Flank pain: No  Suprapubic pain: No  Dysuria: No  Frequency: No  Urinary hesitancy: No  New urinary incontinence: No  Hematuria: No    Endocrine Polydipsia: No  Hematologic Anemia History: Yes  Joint Swelling/pain: No  Neuropsychologic Prolonged sadness: No  Loss of pleasure: No  Paranoia: suspiciousness of other stealing from her Anxiousness / fearfullness: No   Cancer Screening History Breast Cancer: Birads-1 2012 Cervical Cancer: s/p hysterectomy Colorectal Cancer: no history of colonoscopy Lung Cancer: No    20% patients with unintentional weight loss without cause found after investigation Appetitie naturally diminishes with age related to gastric hormone.  Food intake also deminishes with age due to decrease activity, lowered basal metabolic rate, and loss muscle mass.  Possible diagnostic labs or imaging: CMET, LDH,  CBC and Ferritin, ESR, C- reactive protein, vitamin B12, Folate, Vitamin D,  ANA, TSH, FOBT, +/- urinalysis with culture, CXR, Abdomina 2 Viex Xray,  Abdominal US, Thoracic/Abdominal/Pelvic CT with contrast.   * Common etiology  Common Treatments - Treat underlying reversible or maximizable causes - Treat depression / anxiety - Consult with Nutritionists - +/- Nutritional supplements - +/- Appetite stimulants - Remove dietary restrictions,  if possible. Allow family to bring in patient's favorite foods and snacks.   - Stop possibly causal medications, if possible - Assess Swallow function - Address dental or denture difficulties - Treat infectious processes,  -  Treat endocrine conditions - Order assistance with feeding - Meals in social setting - provide multivitamin   Outpatient Encounter Prescriptions as of 12/25/2016  Medication Sig  . albuterol (PROVENTIL HFA;VENTOLIN HFA) 108 (90 Base) MCG/ACT inhaler Inhale 2 puffs into the lungs every 6 (six) hours as needed for wheezing or shortness of breath.  Marland Kitchen amLODipine (NORVASC) 10  MG tablet Take 1 tablet (10 mg total) by mouth daily.  Marland Kitchen apixaban (ELIQUIS) 2.5 MG TABS tablet Take 1 tablet (2.5 mg total) by mouth 2 (two) times daily.  Marland Kitchen aspirin EC 81 MG tablet Take 81 mg by mouth daily.  Marland Kitchen atorvastatin (LIPITOR) 80 MG tablet Take 1 tablet (80 mg total) by mouth at bedtime.  . Blood Glucose Monitoring Suppl (ACCU-CHEK AVIVA PLUS) W/DEVICE KIT use as directed  . calcitRIOL (ROCALTROL) 0.25 MCG capsule take 1 capsule by mouth EVERY MONDAY, WEDNESDAY AND FRIDAY  . Calcium Carbonate-Vitamin D (CALCIUM 600+D) 600-200 MG-UNIT TABS Take 1 tablet by mouth daily.   . carvedilol (COREG) 6.25 MG tablet Take 1 tablet (6.25 mg total) by mouth 2 (two) times daily with a meal. Take 1 tablet by mouth two  times daily with meals  . Chromium-Cinnamon (CINNAMON PLUS CHROMIUM PO) Take 1 capsule by mouth 2 (two) times daily. 35mg/500mg  . docusate sodium (COLACE) 100 MG capsule Take 2 capsules (200 mg total) by mouth 2 (two) times daily. Reported on 12/20/2015  . ELIQUIS 2.5 MG TABS tablet take 1 tablet by mouth twice a day **MUST MAKE APPT FOR FURTHER REFILLS**  . loratadine (CLARITIN) 10 MG tablet Take 1 tablet (10 mg total) by mouth daily.  .Marland Kitchenlosartan (COZAAR) 50 MG tablet Take 1 tablet (50 mg total) by mouth daily. Call office for appointment, no refills until seen  . Omega-3 Fatty Acids (FISH OIL) 1000 MG CAPS Take 1 capsule by mouth 2 (two) times daily.   . ONE TOUCH ULTRA TEST test strip TEST twice a day FOR SUGAR  . Polyvinyl Alcohol-Povidone (REFRESH OP) Place 1 drop into both eyes 3 (three) times daily as needed (dry eyes).  . ranitidine (ZANTAC) 150 MG tablet take 1 tablet by mouth twice a day  . senna-docusate (SENOKOT-S) 8.6-50 MG tablet Take 1 tablet by mouth 2 (two) times daily as needed for mild constipation.  . vitamin C (ASCORBIC ACID) 500 MG tablet Take 500 mg by mouth 2 (two) times daily.   No facility-administered encounter medications on file as of 12/25/2016.      History Patient Active Problem List   Diagnosis Date Noted  . Ovarian cyst 10/28/2016    Priority: High  . Mixed Alzheimer's and vascular dementia 01/31/2016    Priority: High  . COPD exacerbation ([place] 09/12/2015    Priority: High  . Secondary hyperparathyroidism ([place] 11/10/2011    Priority: High  . [place]-induced atrial fibrillation 02/06/2011    Priority: High  . Type 2 diabetes mellitus ([place] 08/27/2006    Priority: High  . Hearing impairment 12/26/2016    Priority: Medium  . Impaired mobility and ADLs 12/26/2016    Priority: Medium  . Urinary incontinence in female 12/26/2016    Priority: Medium  . Visual impairment in both eyes     Priority: Medium  . HYPERTENSION, BENIGN SYSTEMIC 08/27/2006    Priority: Medium  . Constipation 11/27/2014    Priority: Low  . RENAL DISEASE, CHRONIC, STAGE III 01/26/2007    Priority: Low  . HLD (hyperlipidemia) 08/27/2006    Priority: Low  .  OSTEOARTHRITIS, MULTI SITES 08/27/2006    Priority: Low  . DNR (do not resuscitate) 08/22/2016  . Skin lesion of neck 04/18/2016  . Hematochezia 12/24/2015  . Osteoporosis 09/11/2014  . History of anemia 11/10/2011   Past Medical History:  Diagnosis Date  . A-fib ([place])   . Anemia    anemia of renal disease  . Arthritis    mutiple joints  . Cancer [facility])    Renal neoplasm- survellience via Urology  . CHF (congestive heart failure) ([place])    Diastolic dys-- EF 02% july 2011  . Chronic kidney disease    Stage III- followed by [facility]  . COPD (chronic obstructive pulmonary disease) ([place])   . Diabetes mellitus   . DIASTOLIC DYSFUNCTION 5/42/7062   Qualifier: Diagnosis of  By: Buelah Manis MD, Lonell Grandchild    . GERD (gastroesophageal reflux disease)   . H/O hiatal hernia   . Hyperlipidemia   . Hypertension   . Left bundle branch block   . NEOPLASM, KIDNEY 03/04/2007   complex right renal cyst; Per patient- Urologist no longer following- no changes.      . Nonischemic cardiomyopathy  ([place])   . Peripheral neuropathy   . Pneumonia   . Shortness of breath    Hx: of with exertion  . Venous insufficiency 02/24/2011   Past Surgical History:  Procedure Laterality Date  . ABDOMINAL HYSTERECTOMY    . CARDIAC CATHETERIZATION  08/12/2011   heavily calcified L main; LAD with no stenosis; 1st diagonal free of stenosis; L Cfx with no stenosis; OM1/2 without stenosis; PDA without stenosis; RCA without significant stenosis; non-ischemic cardiomyopathy (Dr. Kathy Breach)  . CATARACT EXTRACTION W/ INTRAOCULAR LENS  IMPLANT, BILATERAL    . CHOLECYSTECTOMY  1987  . DILATION AND CURETTAGE OF UTERUS    . LEFT HEART CATHETERIZATION WITH CORONARY ANGIOGRAM N/A 08/12/2011   Procedure: LEFT HEART CATHETERIZATION WITH CORONARY ANGIOGRAM;  Surgeon: Pixie Casino, MD;  Location: [facility] CATH LAB;  Service: Cardiovascular;  Laterality: N/A;  . LUMBAR LAMINECTOMY/DECOMPRESSION MICRODISCECTOMY Left 05/12/2013   Procedure: Left Lumbar four-five laminotomy and microdiskectomy;  Surgeon: Hosie Spangle, MD;  Location: [place] NEURO ORS;  Service: Neurosurgery;  Laterality: Left;  Left Lumbar four-five laminotomy and microdiskectomy  . NM MYOCAR PERF WALL MOTION  05/2011   lexiscan myoview - fixed septal defect r/t LBBB; normal pattern of perfusion in all regions; low risk scan  . TONSILLECTOMY    . TRANSTHORACIC ECHOCARDIOGRAM  09/2011   EF 60-65%; MV mod-severely calcified, mildly thickened MV annulus, mild MR; LA mod dilated; RA mildly dilated   Family History  Problem Relation Age of Onset  . Heart disease Mother   . Arthritis Mother    Social History   Social History  . Marital status: Married    Spouse name: N/A  . Number of children: 3  . Years of education: N/A   Occupational History  .  Retired   Social History Main Topics  . Smoking status: Passive Smoke Exposure - Never Smoker  . Smokeless tobacco: Never Used  . Alcohol use No  . Drug use: No  . Sexual activity: Not Asked   Other  Topics Concern  . None   Social History Narrative   01/31/16 Dr. Emmaline Life       Patient has faith in God and plays a large part in how she deals with problems. However, does not attend church regularly.    Lives with husband, Elisabella Hacker, who does all driving and  her pill box.       Patient has 3 children    1. [place]    2. [place]   3. [name] (deceased)     Cardiovascular Risk Factors: Hypertension and NIDDM  Personal History of Seizures: No -  Personal History of Stroke: Yes - cerebellar infarct Personal History of Psychiatric Disorders: No -   Educational History: 10 or 11 years formal education  Basic Activities of Daily Living  AADLs IIndependent [employer] Assistance DDependent  Bbathing  xx   Ddressing  xx   AAmbulation  xx   TToileting xx    EEating xx x        Instrumental Activities of Daily Living IADL Independent Needs Assistance Dependent  Cooking   x  Housework   x  Manage Medications   x  Manage the telephone   x  Shopping for food, clothes, Meds, etc   x  Use transportation   x  Manage Finances   x    Caregivers in home: husband  Caregiver Burdens: Husband feels he is able to meet her needs at this time   FALLS in last five office visits:  Fall Risk  12/25/2016 08/21/2016 05/12/2016 04/25/2016 04/18/2016  Falls in the past year? Yes No No No No  Number falls in past yr: 2 or more - - - -  Injury with Fall? Yes - - - -  Risk Factor Category  High Fall Risk - - - -  Risk for fall due to : - - - - -    Health Maintenance reviewed: Immunization History  Administered Date(s) Administered  . Influenza Split 03/19/2011, 03/11/2012  . Influenza Whole 03/16/2008, 04/01/2010  . Influenza, High Dose Seasonal PF 03/10/2015  . Influenza,inj,Quad PF,36+ Mos 03/21/2014  . Influenza-Unspecified 03/23/2013, 02/20/2016  . Pneumococcal Polysaccharide-23 06/30/1997  . Td 06/30/1998  . Tdap 12/10/2010  . Zoster 11/30/2007   Health  Maintenance Topics with due status: Overdue     Topic Date Due   PNA vac Low Risk Adult 06/30/1998   OPHTHALMOLOGY EXAM 02/22/2015   FOOT EXAM 09/07/2015   HEMOGLOBIN A1C 09/17/2016    Nutritional supplements: none  Geriatric Syndromes: Constipation yes ,   Incontinence yes  Dizziness no   Syncope no   Skin problems no   Visual Impairment yes   Hearing impairment yes  Eating impairment no  Impaired Memory or Cognition yes   Behavioral and psychological symptoms: yes   Sleep problems yes   Weight loss yes    ROS Denies fevers/chills; denies changes in appetite; denies changes in weight;  Denies changes in vision / hearing / smell / taste; Denies runny nose / ear pain or discharge / sore throat / sinus congestion / cough/w phlegm; Denies chest congestion / wheezing;  Denies chest pain; denies heart beating slower/thumps inside chest; denies racing heart/flutter; Denies dysuria; denies hematuria;  Denies constipation; denies melena/hematochezia; denies diarrhea;  Denies abdominal discomfort/gaseous bloating; denies N/V; denies heart burn;  Denies recent falls/unsteady gait;  Denies unilateral weakness / clumsiness / tingling / numbness; denies tremors;  Denies sadness / anxiety / suicidal tendencies  Vital Signs Weight: 141 lb (64 kg) Body mass index is 22.08 kg/m. Estimated Creatinine Clearance: 25.2 mL/min (A) (by C-G formula based on SCr of 1.56 mg/dL (H)). Body surface area is 1.74 meters squared. Vitals:   12/25/16 1512  BP: 124/70  Pulse: 62  Temp: 99.2 F (37.3 C)  TempSrc: Oral  SpO2: 97%  Weight: 141 lb (64 kg)  Height: '5\' 7"'  (1.702 m)   Wt Readings from Last 3 Encounters:  12/25/16 141 lb (64 kg)  10/24/16 155 lb (70.3 kg)  08/21/16 156 lb 12.8 oz (71.1 kg)    Hearing Screening   Method: Audiometry   '125Hz'  '250Hz'  '500Hz'  '1000Hz'  '2000Hz'  '3000Hz'  '4000Hz'  '6000Hz'  '8000Hz'   Right ear:   Fail Fail 40  Fail    Left ear:   Fail Fail Fail  Fail      Visual Acuity  Screening   Right eye Left eye Both eyes  Without correction: '20/70 20/70 20/70 '  With correction:       Physical Examination:  VS reviewed GEN: Alert, Cooperative, Groomed, NAD HEENT: PERRL; EAC bilaterally not occluded, TM's translucent with normal LM, (+) LR;                No cervical LAN, No thyromegaly, No palpable masses COR: RRR, No M/G/R, No JVD, Normal PMI size and location LUNGS: BCTA, No Acc mm use, speaking in full sentences EXT: No peripheral leg edema.  Strength: 5/5 Bil. UE and LE symmetric; Sensation: Intact grossly to touch all four extremities; Cerebellar: Finger-to-Nose intact, Rhomberg postivie; Muscle Tone normal; Tremor not present  Timed Up & Go Test: unable to rise without assistance Sit-to-Stand Test: unable to rise without assistance 4-Stage Stand Test:  Feet Side-by-side: No.      Gait: (+) significant path deviation, Step-through not present present  Psych: Normal affect   Mini-Mental State Examination or Montreal Cognitive Assessment:  Patient did  require additional cues or prompts to complete tasks. Patient was cooperative and attentive to testing tasks Patient did  appear motivated to perform well  MMSE - Mini Mental State Exam 10/12/2011  Orientation to time 5  Orientation to Place 5  Registration 3  Attention/ Calculation 5  Recall 2  Language- name 2 objects 2  Language- repeat 1  Language- follow 3 step command 3  Language- read & follow direction 1  Write a sentence 1  Copy design 1  Total score 29        Montreal Cognitive Assessment  12/25/2016  Visuospatial/ Executive (0/5) 1  Naming (0/3) 1  Attention: Read list of digits (0/2) 1  Attention: Read list of letters (0/1) 0  Attention: Serial 7 subtraction starting at 100 (0/3) 0  Language: Repeat phrase (0/2) 0  Language : Fluency (0/1) 0  Abstraction (0/2) 0  Delayed Recall (0/5) 0  Orientation (0/6) 1  Total 4  Adjusted Score (based on education) 5    Geriatric Depression  Scale:  1 / 15  Labs  Lab Results  Component Value Date   VITAMINB12 236 12/25/2016    Lab Results  Component Value Date   FOLATE 9.0 01/31/2016    Lab Results  Component Value Date   TSH 2.92 01/31/2016   ESR 7 mm/Hr  No results found for: RPR    Chemistry      Component Value Date/Time   NA 142 12/25/2016 1624   K 4.4 12/25/2016 1624   CL 107 (H) 12/25/2016 1624   CO2 22 12/25/2016 1624   BUN 23 12/25/2016 1624   CREATININE 1.56 (H) 12/25/2016 1624   CREATININE 1.73 (H) 01/31/2016 1624      Component Value Date/Time   CALCIUM 9.2 12/25/2016 1624   ALKPHOS 79 12/25/2016 1624   AST 15 12/25/2016 1624   ALT 10 12/25/2016 1624   BILITOT 0.9 12/25/2016 1624  Lab Results  Component Value Date   HGBA1C 6.4 03/20/2016      Lab Results  Component Value Date   WBC 7.6 12/25/2016   HGB 10.9 (L) 12/25/2016   HCT 33.8 (L) 12/25/2016   MCV 94 12/25/2016   PLT 215 12/25/2016    Results for orders placed or performed in visit on 12/25/16 (from the past 24 hour(s))  CMP14+EGFR   Collection Time: 12/25/16  4:24 PM  Result Value Ref Range   Glucose 96 65 - 99 mg/dL   BUN 23 8 - 27 mg/dL   Creatinine, Ser 1.56 (H) 0.57 - 1.00 mg/dL   GFR calc non Af Amer 30 (L) >59 mL/min/1.73   GFR calc Af Amer 34 (L) >59 mL/min/1.73   BUN/Creatinine Ratio 15 12 - 28   Sodium 142 134 - 144 mmol/L   Potassium 4.4 3.5 - 5.2 mmol/L   Chloride 107 (H) 96 - 106 mmol/L   CO2 22 20 - 29 mmol/L   Calcium 9.2 8.7 - 10.3 mg/dL   Total Protein 6.2 6.0 - 8.5 g/dL   Albumin 3.9 3.5 - 4.7 g/dL   Globulin, Total 2.3 1.5 - 4.5 g/dL   Albumin/Globulin Ratio 1.7 1.2 - 2.2   Bilirubin Total 0.9 0.0 - 1.2 mg/dL   Alkaline Phosphatase 79 39 - 117 IU/L   AST 15 0 - 40 IU/L   ALT 10 0 - 32 IU/L  CBC with Differential   Collection Time: 12/25/16  4:24 PM  Result Value Ref Range   WBC 7.6 3.4 - 10.8 x10E3/uL   RBC 3.60 (L) 3.77 - 5.28 x10E6/uL   Hemoglobin 10.9 (L) 11.1 - 15.9 g/dL    MCV 94 79 - 97 fL   MCH 30.3 26.6 - 33.0 pg   MCHC 32.2 31.5 - 35.7 g/dL   RDW 15.0 12.3 - 15.4 %   Platelets 215 150 - 379 x10E3/uL   Neutrophils 63 Not Estab. %   Lymphs 24 Not Estab. %   Monocytes 7 Not Estab. %   Eos 5 Not Estab. %   Basos 1 Not Estab. %   Neutrophils Absolute 4.8 1.4 - 7.0 x10E3/uL   Lymphocytes Absolute 1.9 0.7 - 3.1 x10E3/uL   Monocytes Absolute 0.5 0.1 - 0.9 x10E3/uL   EOS (ABSOLUTE) 0.4 0.0 - 0.4 x10E3/uL   Basophils Absolute 0.0 0.0 - 0.2 x10E3/uL   Immature Granulocytes 0 Not Estab. %   Immature Grans (Abs) 0.0 0.0 - 0.1 x10E3/uL  Lactate Dehydrogenase   Collection Time: 12/25/16  4:24 PM  Result Value Ref Range   LDH 156 119 - 226 IU/L  Anemia panel   Collection Time: 12/25/16  4:24 PM  Result Value Ref Range   Total Iron Binding Capacity 186 (L) 250 - 450 ug/dL   UIBC 158 118 - 369 ug/dL   Iron 28 27 - 139 ug/dL   Iron Saturation 15 15 - 55 %   Vitamin B-12 236 232 - 1,245 pg/mL   Folate, Hemolysate WILL FOLLOW    Hematocrit 33.8 (L) 34.0 - 46.6 %   Folate, RBC WILL FOLLOW    Ferritin 365 (H) 15 - 150 ng/mL   Retic Ct Pct 0.8 0.6 - 2.6 %  Sedimentation Rate   Collection Time: 12/25/16  4:40 PM  Result Value Ref Range   Sed Rate 7 0 - 40 mm/hr    Imaging Head CT: 10/15/16:  Old right cerebellar infarct with ex vacuo changes, generalized atrophy  Assessment and Plan: Problem List Items Addressed This Visit      Medium   Visual impairment in both eyes   Hearing impairment   Impaired mobility and ADLs   Urinary incontinence in female    Other Visit Diagnoses    Loss of weight    -  Primary   Relevant Orders   CMP14+EGFR (Completed)   CBC with Differential (Completed)   Lactate Dehydrogenase (Completed)   Anemia panel (Completed)   Sedimentation Rate (Completed)      Personal Strengths Active sense of humor Communication skills Supportive family/friends  Support System Strengths Supportive Relationships  Advanced  Directives: Code Status: DNR   Montreal Cognitive Assessment  12/25/2016  Visuospatial/ Executive (0/5) 1  Naming (0/3) 1  Attention: Read list of digits (0/2) 1  Attention: Read list of letters (0/1) 0  Attention: Serial 7 subtraction starting at 100 (0/3) 0  Language: Repeat phrase (0/2) 0  Language : Fluency (0/1) 0  Abstraction (0/2) 0  Delayed Recall (0/5) 0  Orientation (0/6) 1  Total 4  Adjusted Score (based on education) 5  Contact: First and Last Name if other than the patient involved: Danyel Tobey  [phone] (home)    Patient to Follow up with Dr.Mikell in 4 weeks   45 minutes face to face where spent in total with counseling / coordination of care took more than 50% of the total time. Counseling involved discussion with patient's husband about applying home health services, personal care services, and sequence of agents of substituted judgment by [place] statute.  Discussion on diagnosis of dementia, its natural course, role of pharmacologic and nonpharmacologic management of cognitive impairment and behavioral & psychological symptoms of dementia. We also discussed role of support groups for caretakers of patients with dementia.  Care was coordinated with Casimer Lanius, MSW, LCSW, who contacted the husband by phone after the consultation.

## 2016-12-26 ENCOUNTER — Telehealth: Payer: Self-pay | Admitting: Licensed Clinical Social Worker

## 2016-12-26 ENCOUNTER — Encounter: Payer: Self-pay | Admitting: Family Medicine

## 2016-12-26 DIAGNOSIS — R32 Unspecified urinary incontinence: Secondary | ICD-10-CM | POA: Insufficient documentation

## 2016-12-26 DIAGNOSIS — R296 Repeated falls: Secondary | ICD-10-CM | POA: Insufficient documentation

## 2016-12-26 DIAGNOSIS — Z789 Other specified health status: Secondary | ICD-10-CM | POA: Insufficient documentation

## 2016-12-26 DIAGNOSIS — Z7409 Other reduced mobility: Secondary | ICD-10-CM | POA: Insufficient documentation

## 2016-12-26 DIAGNOSIS — I639 Cerebral infarction, unspecified: Secondary | ICD-10-CM | POA: Insufficient documentation

## 2016-12-26 DIAGNOSIS — E538 Deficiency of other specified B group vitamins: Secondary | ICD-10-CM | POA: Insufficient documentation

## 2016-12-26 DIAGNOSIS — H543 Unqualified visual loss, both eyes: Secondary | ICD-10-CM | POA: Insufficient documentation

## 2016-12-26 DIAGNOSIS — H9190 Unspecified hearing loss, unspecified ear: Secondary | ICD-10-CM | POA: Insufficient documentation

## 2016-12-26 LAB — CBC WITH DIFFERENTIAL/PLATELET
BASOS ABS: 0 10*3/uL (ref 0.0–0.2)
Basos: 1 %
EOS (ABSOLUTE): 0.4 10*3/uL (ref 0.0–0.4)
Eos: 5 %
Hemoglobin: 10.9 g/dL — ABNORMAL LOW (ref 11.1–15.9)
Immature Grans (Abs): 0 10*3/uL (ref 0.0–0.1)
Immature Granulocytes: 0 %
Lymphocytes Absolute: 1.9 10*3/uL (ref 0.7–3.1)
Lymphs: 24 %
MCH: 30.3 pg (ref 26.6–33.0)
MCHC: 32.2 g/dL (ref 31.5–35.7)
MCV: 94 fL (ref 79–97)
MONOS ABS: 0.5 10*3/uL (ref 0.1–0.9)
Monocytes: 7 %
NEUTROS ABS: 4.8 10*3/uL (ref 1.4–7.0)
NEUTROS PCT: 63 %
PLATELETS: 215 10*3/uL (ref 150–379)
RBC: 3.6 x10E6/uL — AB (ref 3.77–5.28)
RDW: 15 % (ref 12.3–15.4)
WBC: 7.6 10*3/uL (ref 3.4–10.8)

## 2016-12-26 LAB — CMP14+EGFR
A/G RATIO: 1.7 (ref 1.2–2.2)
ALBUMIN: 3.9 g/dL (ref 3.5–4.7)
ALK PHOS: 79 IU/L (ref 39–117)
ALT: 10 IU/L (ref 0–32)
AST: 15 IU/L (ref 0–40)
BILIRUBIN TOTAL: 0.9 mg/dL (ref 0.0–1.2)
BUN / CREAT RATIO: 15 (ref 12–28)
BUN: 23 mg/dL (ref 8–27)
CO2: 22 mmol/L (ref 20–29)
Calcium: 9.2 mg/dL (ref 8.7–10.3)
Chloride: 107 mmol/L — ABNORMAL HIGH (ref 96–106)
Creatinine, Ser: 1.56 mg/dL — ABNORMAL HIGH (ref 0.57–1.00)
GFR calc Af Amer: 34 mL/min/{1.73_m2} — ABNORMAL LOW (ref >59)
GFR calc non Af Amer: 30 mL/min/{1.73_m2} — ABNORMAL LOW (ref >59)
GLOBULIN, TOTAL: 2.3 g/dL (ref 1.5–4.5)
Glucose: 96 mg/dL (ref 65–99)
POTASSIUM: 4.4 mmol/L (ref 3.5–5.2)
SODIUM: 142 mmol/L (ref 134–144)
Total Protein: 6.2 g/dL (ref 6.0–8.5)

## 2016-12-26 LAB — ANEMIA PANEL
FERRITIN: 365 ng/mL — AB (ref 15–150)
FOLATE, HEMOLYSATE: 310.7 ng/mL
FOLATE, RBC: 919 ng/mL (ref >498)
Hematocrit: 33.8 % — ABNORMAL LOW (ref 34.0–46.6)
Iron Saturation: 15 % (ref 15–55)
Iron: 28 ug/dL (ref 27–139)
Retic Ct Pct: 0.8 % (ref 0.6–2.6)
Total Iron Binding Capacity: 186 ug/dL — ABNORMAL LOW (ref 250–450)
UIBC: 158 ug/dL (ref 118–369)
VITAMIN B 12: 236 pg/mL (ref 232–1245)

## 2016-12-26 LAB — LACTATE DEHYDROGENASE: LDH: 156 IU/L (ref 119–226)

## 2016-12-26 LAB — SEDIMENTATION RATE: Sed Rate: 7 mm/hr (ref 0–40)

## 2016-12-26 NOTE — Assessment & Plan Note (Signed)
Recommend that methylmalonic acid level be checked next ov to look for occult vitamin B12 deficiency.

## 2016-12-26 NOTE — Assessment & Plan Note (Addendum)
FAST Stage: Six (moderate to severe) requirment for assistance with some ADLs memory loss, slow information processing, difficulty with abstract concepts and difficulty with complex reasoning decreased problem solving Palliative Performance Score: 50 to 60%  Progression since last assessment: first assessment Medication or substances that may impair patient's cognition: none Behavioral and Psychological Symptoms of Dementia: visual hallucinations of unfamiliar people - not threatening nor frightening.  Suspiciousness that others may be stealing from her.  Supportive Interventions for caregiver (support group or individual counseling): Request to Casimer Lanius, MSW, LCSW, to contact patient's husband to review application for Personal Care Services,  senior community resources, dementia education and support groups, and availability of respite care. Dementia education booklet given to husband RTC in 4 weeks to see Dr Emmaline Life to assess patient and husband needs with this diagnosis.   Physical Activity recommendation: getting out of house during day Financial, legal and medical advanced planning recommended to patient and caregivers: husband needs to establish his own HCPOA. Follow up

## 2016-12-26 NOTE — Assessment & Plan Note (Signed)
Medicaid can help with the cost of adult diapers for patient.  Whether or not patient's husband is using her Medicaid for this needs to be sorted out.

## 2016-12-26 NOTE — Assessment & Plan Note (Addendum)
Patient's husband reports that there already is a referral by Dr Emmaline Life for [place] PT and OT evaluation and treatment, but the [facility] agency has not contacted them.  I asked L. Ducatte, RN to investigate what is holding up beginning this therapy.

## 2016-12-26 NOTE — Progress Notes (Signed)
Social work consult from Dr. McDiarmid reference review application for [employer], senior community resources, dementia education and support groups, and availability of respite care.    Called patient's husband [name] [phone] to assess for the above consult. Discussed Personal Care Services, community resources and support groups.  He would like PCS. Intervention:  husband declined [employer] and support group information. PCS Referral form placed in Dr. Princess Perna mailbox to sign.    Plan:  1. LCSW will f/u with patient and husband during next office visit 01/01/17 with Dr. Emmaline Life. 2. Will fax PCS form once signed.  Casimer Lanius, LCSW Licensed Clinical Social Worker [place]   [phone] 11:47 AM

## 2016-12-26 NOTE — Assessment & Plan Note (Signed)
Patient's weight is down 15 pounds (nearly 10%) in last 4 to 5 months. Serum albumin 4.0 WNL.  Multiple possible contributing conditions: Frailty, poor appetite, poor dentition, nocturnal wandering in home, recent left forearm fracture reported by husband but unable to corroborate in EMR, secondary hyperparathyroidism from CKD IIIb/A2, constipation, dementia, old stroke, and Ovarian cyst and old complex kidney cyst from 2008 (malignancy?) Normal LDH and ESR reassuring.   Will bearing watching on subsequent visits.

## 2016-12-26 NOTE — Assessment & Plan Note (Signed)
If patient would be interested in hearing aid, then contact Dr Jeziel Hoffmann to see how patient can use her Medicaid to start the process of getting a hearing aid free of charge thru the [facility] in [facility].

## 2016-12-26 NOTE — Assessment & Plan Note (Signed)
Ensure patient is being followed by ophthalmology. Optimize vision to decrease risk of falls.  Home safety evaluation important

## 2016-12-29 DIAGNOSIS — S52501D Unspecified fracture of the lower end of right radius, subsequent encounter for closed fracture with routine healing: Secondary | ICD-10-CM | POA: Diagnosis not present

## 2017-01-01 ENCOUNTER — Ambulatory Visit (INDEPENDENT_AMBULATORY_CARE_PROVIDER_SITE_OTHER): Payer: Medicare Other | Admitting: Internal Medicine

## 2017-01-01 ENCOUNTER — Encounter: Payer: Self-pay | Admitting: Licensed Clinical Social Worker

## 2017-01-01 ENCOUNTER — Encounter: Payer: Self-pay | Admitting: Internal Medicine

## 2017-01-01 VITALS — BP 130/68 | HR 81 | Temp 98.0°F | Ht 67.0 in | Wt 140.6 lb

## 2017-01-01 DIAGNOSIS — E538 Deficiency of other specified B group vitamins: Secondary | ICD-10-CM

## 2017-01-01 DIAGNOSIS — R634 Abnormal weight loss: Secondary | ICD-10-CM

## 2017-01-01 DIAGNOSIS — E1142 Type 2 diabetes mellitus with diabetic polyneuropathy: Secondary | ICD-10-CM | POA: Diagnosis not present

## 2017-01-01 DIAGNOSIS — H543 Unqualified visual loss, both eyes: Secondary | ICD-10-CM | POA: Diagnosis not present

## 2017-01-01 DIAGNOSIS — F01518 Vascular dementia, unspecified severity, with other behavioral disturbance: Secondary | ICD-10-CM

## 2017-01-01 DIAGNOSIS — I10 Essential (primary) hypertension: Secondary | ICD-10-CM | POA: Diagnosis not present

## 2017-01-01 DIAGNOSIS — R7989 Other specified abnormal findings of blood chemistry: Secondary | ICD-10-CM

## 2017-01-01 DIAGNOSIS — F0151 Vascular dementia with behavioral disturbance: Secondary | ICD-10-CM

## 2017-01-01 LAB — POCT GLYCOSYLATED HEMOGLOBIN (HGB A1C): Hemoglobin A1C: 6.2

## 2017-01-01 MED ORDER — HALOPERIDOL 0.5 MG PO TABS: 0.5000 mg | ORAL_TABLET | Freq: Three times a day (TID) | ORAL | 1 refills | Status: DC | PRN

## 2017-01-01 NOTE — Patient Instructions (Signed)
I have given you haldol for moments of agitation, please take this only when needed. I am going to follow up regarding the home health needs that were placed. Please visit opthalmology regarding your eyes.

## 2017-01-01 NOTE — Progress Notes (Signed)
Social work consult to provide patient and husband an update on the [facility] referral.  Reviewed services, explained process and informed them [facility] would be contacting them in the next two weeks. Patient and husband pleasant and appreciative of information.   Plan: LCSW will f/u with [facility] in 3 days to make sure they have all needed information.   Casimer Lanius, LCSW Licensed Clinical Social Worker [place]   [phone] 4:31 PM

## 2017-01-01 NOTE — Progress Notes (Signed)
   [facility] Kerrin Mo, MD Phone: [phone]  Reason For Visit: F/U   Ms. Habermehl has significant dementia therefore this visit was discussed in part with the help of Mr. White  # Dementia - Patient recently seen in geriatric clinic. -Notes per Dr. McDiramid reviewed. -Social work [employer] involved in getting patient daily home care -Mr. Schendel concerned about patient having episodes of aggression, would like something to help calm patient down -Currently discussing home health nursing care, per Mr. Latu was contacted one time and then has not been contacted any further- no home health nurse has been out to their house  #Visual Impairment - Patient having trouble with vision has not seen ophthalmology. Indicates blurry vision which has been chronic in nature. Has not followed up on this issue.  #Noted to have borderline B12 -Per Dr. McDiramid recommending methylmalonic acid assay to rule out B12 deficiency -Patient denies any numbness, tingling issues   #HTN - Recently seen by nephrology for CKD - Recommended stopping Norvasc, discussed with patient that I am in agreement with nephrology -Per nephrology also to use losartan depending on patient's SBP, daily blood pressure per Mr. Vink is too difficult to obtain. Therefore we agreed to continue taking losartan daily without other modification  Denies Ms. Minahan denies any  dizziness / lightheadedness  Past Medical History Reviewed problem list.  Medications- reviewed and updated No additions to family history Social history- patient is a non- smoker  Objective: BP 130/68   Pulse 81   Temp 98 F (36.7 C) (Oral)   Ht 5\' 7"  (1.702 m)   Wt 140 lb 9.6 oz (63.8 kg)   SpO2 96%   BMI 22.02 kg/m  Gen: NAD, alert, cooperative with exam Cardio: regular rate and rhythm, S1S2 heard, no murmurs appreciated Pulm: clear to auscultation bilaterally, no wheezes, rhonchi or rales GI: soft, non-tender,  non-distended, bowel sounds present, no hepatomegaly, no splenomegaly Skin: dry, intact, no rashes or lesions   Assessment/Plan: See problem based a/p  Borderline Low serum vitamin B12 Obtain methylmalonic acid serum level If elevated will start patient on supplemental B12  HYPERTENSION, BENIGN SYSTEMIC Norvasc stopped, patient with history of one fall Continue losartan as prescribed for now Nephrology following along with patient's CKD  Visual impairment in both eyes - Ambulatory referral to Ophthalmology    Dementia, vascular, mixed, with behavioral disturbance -Will prescribe patient Haldol as needed for episodes of aggression - Working to get patient in home care- Casimer Lanius is working on this  - Will follow up on home health nursing care

## 2017-01-02 ENCOUNTER — Telehealth: Payer: Self-pay | Admitting: *Deleted

## 2017-01-02 NOTE — Telephone Encounter (Signed)
Attempted to reach patient/spouse to discuss need for incontinence supplies. Can receive supplies at no cost through medicaid. No answer and no VM set up. Will make second attempt later. Hubbard Hartshorn, RN, BSN

## 2017-01-02 NOTE — Telephone Encounter (Signed)
Left message on mobile VM requesting return call to discuss free incontinence supplies. Hubbard Hartshorn, RN, BSN

## 2017-01-04 LAB — METHYLMALONIC ACID, SERUM: Methylmalonic Acid: 1001 nmol/L — ABNORMAL HIGH (ref 0–378)

## 2017-01-06 ENCOUNTER — Telehealth: Payer: Self-pay | Admitting: Licensed Clinical Social Worker

## 2017-01-06 NOTE — Assessment & Plan Note (Signed)
-  Will prescribe patient Haldol as needed for episodes of aggression - Working to get patient in home care- Casimer Lanius is working on this  - Will follow up on home health nursing care

## 2017-01-06 NOTE — Assessment & Plan Note (Signed)
Obtain methylmalonic acid serum level If elevated will start patient on supplemental B12

## 2017-01-06 NOTE — Progress Notes (Signed)
LCSW called [facility] at [phone] to verify receipt and process of patient's PCS application.  Per Tammy at [facility] all information received, patient's home assessment was scheduled today at 8:30.  Casimer Lanius, LCSW Licensed Clinical Social Worker [place]   [phone] 10:39 AM

## 2017-01-06 NOTE — Assessment & Plan Note (Signed)
Norvasc stopped, patient with history of one fall Continue losartan as prescribed for now Nephrology following along with patient's CKD

## 2017-01-06 NOTE — Assessment & Plan Note (Signed)
-   Ambulatory referral to Ophthalmology

## 2017-01-10 ENCOUNTER — Other Ambulatory Visit: Payer: Self-pay | Admitting: Internal Medicine

## 2017-01-16 DIAGNOSIS — H26491 Other secondary cataract, right eye: Secondary | ICD-10-CM | POA: Diagnosis not present

## 2017-01-19 DIAGNOSIS — S329XXA Fracture of unspecified parts of lumbosacral spine and pelvis, initial encounter for closed fracture: Secondary | ICD-10-CM | POA: Diagnosis not present

## 2017-01-19 DIAGNOSIS — M353 Polymyalgia rheumatica: Secondary | ICD-10-CM | POA: Diagnosis not present

## 2017-01-19 NOTE — Telephone Encounter (Signed)
Reached husband who states he would like incontinence supplies for his wife.  Order signed by Dr. McDiarmid faxed to [employer] at [phone]. Hubbard Hartshorn, RN, BSN

## 2017-01-22 ENCOUNTER — Telehealth: Payer: Self-pay | Admitting: Internal Medicine

## 2017-01-22 DIAGNOSIS — E538 Deficiency of other specified B group vitamins: Secondary | ICD-10-CM

## 2017-01-22 MED ORDER — VITAMIN B-12 100 MCG PO TABS: 100.0000 ug | ORAL_TABLET | Freq: Every day | ORAL | 3 refills | Status: DC

## 2017-01-22 NOTE — Telephone Encounter (Signed)
Please call patient and let her know that her B12 level was low. She needs to take supplemental B12, I have sent in a prescription for this. Thanks.

## 2017-01-23 ENCOUNTER — Other Ambulatory Visit: Payer: Self-pay | Admitting: Internal Medicine

## 2017-01-29 ENCOUNTER — Other Ambulatory Visit: Payer: Self-pay | Admitting: Internal Medicine

## 2017-01-29 NOTE — Telephone Encounter (Signed)
Patient informed, expressed understanding. 

## 2017-02-10 ENCOUNTER — Encounter: Payer: Self-pay | Admitting: Internal Medicine

## 2017-02-10 ENCOUNTER — Ambulatory Visit (INDEPENDENT_AMBULATORY_CARE_PROVIDER_SITE_OTHER): Payer: Medicare Other | Admitting: Internal Medicine

## 2017-02-10 VITALS — BP 152/68 | HR 70 | Temp 97.8°F | Ht 67.0 in | Wt 145.6 lb

## 2017-02-10 DIAGNOSIS — F0151 Vascular dementia with behavioral disturbance: Secondary | ICD-10-CM

## 2017-02-10 DIAGNOSIS — F01518 Vascular dementia, unspecified severity, with other behavioral disturbance: Secondary | ICD-10-CM

## 2017-02-10 MED ORDER — QUETIAPINE FUMARATE 50 MG PO TABS: 50.0000 mg | ORAL_TABLET | Freq: Every day | ORAL | 1 refills | Status: DC

## 2017-02-10 NOTE — Patient Instructions (Signed)
Please take this medication once daily at night, please take Seroquel 50 mg. This will help with your sleep and should help with mood. I want you to stop Haldol. Please follow-up with me in 2 weeks to see how things are improving.

## 2017-02-10 NOTE — Assessment & Plan Note (Signed)
Discussed with the husband that dementia is a progressive disease and in the near future she may need to go to facility for further care  -We will stop the haldol and start patient on low dose Seroquel to be taken at night to help with sleep and mood changes  -Follow up in two weeks to see how things are going

## 2017-02-10 NOTE — Progress Notes (Signed)
   [facility] Kerrin Mo, MD Phone: [phone]  Reason For Visit: Follow up for Dementia   # Dementia  Patient with vascular dementia. At last visit patient was started on Haldol to help with outbursts of anger and paranoid accusations. This has not helped and patient continues to have paranoia and anger. She is also wandering at night and her husband is not able to sleep as he is continues to worry about her.   Past Medical History Reviewed problem list.  Medications- reviewed and updated No additions to family history Social history- patient is a non smoker  Objective: BP (!) 152/68   Pulse 70   Temp 97.8 F (36.6 C) (Oral)   Ht 5\' 7"  (1.702 m)   Wt 145 lb 9.6 oz (66 kg)   SpO2 96%   BMI 22.80 kg/m  Gen: NAD, alert, cooperative with exam Cardio: regular rate and rhythm, S1S2 heard, no murmurs appreciated Pulm: clear to auscultation bilaterally, no wheezes, rhonchi or rales Skin: dry, intact, no rashes or lesions   Assessment/Plan: See problem based a/p  Dementia, vascular, mixed, with behavioral disturbance Discussed with the husband that dementia is a progressive disease and in the near future she may need to go to facility for further care  -We will stop the haldol and start patient on low dose Seroquel to be taken at night to help with sleep and mood changes  -Follow up in two weeks to see how things are going

## 2017-02-11 ENCOUNTER — Telehealth: Payer: Self-pay | Admitting: *Deleted

## 2017-02-11 DIAGNOSIS — F01518 Vascular dementia, unspecified severity, with other behavioral disturbance: Secondary | ICD-10-CM

## 2017-02-11 DIAGNOSIS — F0151 Vascular dementia with behavioral disturbance: Secondary | ICD-10-CM

## 2017-02-11 MED ORDER — QUETIAPINE FUMARATE 50 MG PO TABS: 50.0000 mg | ORAL_TABLET | Freq: Every day | ORAL | 1 refills | Status: DC

## 2017-02-11 NOTE — Telephone Encounter (Signed)
Seroquel was sent to the wrong pharmacy. Medication should have went to [facility].  Medication was e-prescribed.  Derl Barrow, RN

## 2017-02-18 ENCOUNTER — Other Ambulatory Visit: Payer: Self-pay | Admitting: Internal Medicine

## 2017-02-19 ENCOUNTER — Telehealth: Payer: Self-pay | Admitting: Internal Medicine

## 2017-02-19 DIAGNOSIS — I480 Paroxysmal atrial fibrillation: Secondary | ICD-10-CM

## 2017-02-19 MED ORDER — APIXABAN 2.5 MG PO TABS: 2.5000 mg | ORAL_TABLET | Freq: Two times a day (BID) | ORAL | 0 refills | Status: DC

## 2017-02-19 NOTE — Telephone Encounter (Signed)
Yes, I don't mind refilling the Eliquis for patient. Went ahead in sent in a prescription

## 2017-02-19 NOTE — Telephone Encounter (Signed)
-----   Message from [place], [facility] sent at 02/19/2017 12:01 PM EDT ----- Regarding: Eliquis refill Last seen by cardiologist 07/26/2015.   Noted as PCP you refill all other medication. Are you able to continue apixaban refills?  Thank you Raquel Rodriguez-Guzman PharmD, BCPS, [place] [phone] 02/19/2017 12:04 PM

## 2017-02-19 NOTE — Telephone Encounter (Signed)
Last seen by cardiologist on 07/26/2015. No longer under Dr [facility] care (see phone notes). Note sent to PCP to refill apixaban as/if needed.

## 2017-02-23 DIAGNOSIS — Z79899 Other long term (current) drug therapy: Secondary | ICD-10-CM | POA: Insufficient documentation

## 2017-02-23 DIAGNOSIS — D649 Anemia, unspecified: Secondary | ICD-10-CM | POA: Insufficient documentation

## 2017-02-23 DIAGNOSIS — R4182 Altered mental status, unspecified: Secondary | ICD-10-CM | POA: Insufficient documentation

## 2017-02-23 NOTE — Assessment & Plan Note (Signed)
Seroquel, Eliquis, Calcitriol, Losartan, Aspirin

## 2017-02-23 NOTE — Assessment & Plan Note (Addendum)
Likely Behavioral and Psychological symptoms of dementia Check ESR

## 2017-02-23 NOTE — Assessment & Plan Note (Addendum)
New problem Check Anemia panel and LDH for hemolytic anemia

## 2017-02-24 ENCOUNTER — Ambulatory Visit (INDEPENDENT_AMBULATORY_CARE_PROVIDER_SITE_OTHER): Payer: Medicare Other | Admitting: Internal Medicine

## 2017-02-24 ENCOUNTER — Encounter: Payer: Self-pay | Admitting: Internal Medicine

## 2017-02-24 VITALS — BP 142/60 | HR 54 | Ht 67.0 in | Wt 143.8 lb

## 2017-02-24 DIAGNOSIS — I447 Left bundle-branch block, unspecified: Secondary | ICD-10-CM | POA: Diagnosis not present

## 2017-02-24 DIAGNOSIS — I482 Chronic atrial fibrillation: Secondary | ICD-10-CM | POA: Diagnosis not present

## 2017-02-24 DIAGNOSIS — I10 Essential (primary) hypertension: Secondary | ICD-10-CM

## 2017-02-24 DIAGNOSIS — I4821 Permanent atrial fibrillation: Secondary | ICD-10-CM

## 2017-02-24 DIAGNOSIS — E782 Mixed hyperlipidemia: Secondary | ICD-10-CM

## 2017-02-24 NOTE — Progress Notes (Signed)
OFFICE NOTE  Chief Complaint:  No complaints  Primary Care Physician: Tonette Bihari, MD  HPI:  [name] is an 83 year old female with history of AFib, left bundle branch block. She recently had an echo in our office in January 2013 which showed an of 40% to 45%. Subsequently, her medications were adjusted. She ended up being hospitalized, and in-hospital echo showed an improvement in the EF up to 60% to 65%. She has also had some improvement in her serum creatinine, which has come down, but does have stage III chronic kidney disease (however, she reports her creatinine has improved to 1.2 recently). Her AFib rate has been well controlled at 73. Overall, she is asymptomatic. Denies any heart failure symptoms, chest pain, palpitations, presyncope, or syncopal symptoms.  She does report some fatigue recently and her blood pressure is noted to be low today.  He has some occasional lower extremity swelling worse on the left than the right leg.  [name] returns today for follow-up. She has some mild dyspnea with exertion. She denies chest pain. Blood pressure is well-controlled today. Weight is stable. Her appetite is good. She remains in A. Fib with adequate rate control in the 60s. She switch from warfarin to Eliquis due to difficulty controlling her INRs and she says this is worked out well for her.  02/24/2017  [name] was seen today in follow-up. Overall she is doing well. She denies any worsening shortness of breath or chest pain. Her A. fib is been rate control. She has a left bundle branch block which is stable. She is on Eliquis and denies any bleeding problems. Renal functions been stable. Echo last year showed normal systolic function with EF 60-65%.  PMHx:  Past Medical History:  Diagnosis Date  . A-fib ([place])   . Anemia    anemia of renal disease  . Arthritis    mutiple joints  . Cancer [facility])    Renal neoplasm- survellience via Urology  . CHF (congestive heart failure)  ([place])    Diastolic dys-- EF 76% july 2011  . Chronic kidney disease    Stage III- followed by [facility]  . COPD (chronic obstructive pulmonary disease) ([place])   . Dementia, vascular, mixed, with behavioral disturbance 01/31/2016  . Diabetes mellitus   . DIASTOLIC DYSFUNCTION 1/95/0932   Qualifier: Diagnosis of  By: Buelah Manis MD, Lonell Grandchild    . GERD (gastroesophageal reflux disease)   . H/O hiatal hernia   . Hyperlipidemia   . Hypertension   . Left bundle branch block   . NEOPLASM, KIDNEY 03/04/2007   complex right renal cyst; Per patient- Urologist no longer following- no changes.      . Nonischemic cardiomyopathy ([place])   . Peripheral neuropathy   . Pneumonia   . Shortness of breath    Hx: of with exertion  . Venous insufficiency 02/24/2011    Past Surgical History:  Procedure Laterality Date  . ABDOMINAL HYSTERECTOMY    . CARDIAC CATHETERIZATION  08/12/2011   heavily calcified L main; LAD with no stenosis; 1st diagonal free of stenosis; L Cfx with no stenosis; OM1/2 without stenosis; PDA without stenosis; RCA without significant stenosis; non-ischemic cardiomyopathy (Dr. Kathy Breach)  . CATARACT EXTRACTION W/ INTRAOCULAR LENS  IMPLANT, BILATERAL    . CHOLECYSTECTOMY  1987  . DILATION AND CURETTAGE OF UTERUS    . LEFT HEART CATHETERIZATION WITH CORONARY ANGIOGRAM N/A 08/12/2011   Procedure: LEFT HEART CATHETERIZATION WITH CORONARY ANGIOGRAM;  Surgeon: Pixie Casino,  MD;  Location: [facility] CATH LAB;  Service: Cardiovascular;  Laterality: N/A;  . LUMBAR LAMINECTOMY/DECOMPRESSION MICRODISCECTOMY Left 05/12/2013   Procedure: Left Lumbar four-five laminotomy and microdiskectomy;  Surgeon: Hosie Spangle, MD;  Location: [place] NEURO ORS;  Service: Neurosurgery;  Laterality: Left;  Left Lumbar four-five laminotomy and microdiskectomy  . NM MYOCAR PERF WALL MOTION  05/2011   lexiscan myoview - fixed septal defect r/t LBBB; normal pattern of perfusion in all regions; low risk scan  .  TONSILLECTOMY    . TRANSTHORACIC ECHOCARDIOGRAM  09/2011   EF 60-65%; MV mod-severely calcified, mildly thickened MV annulus, mild MR; LA mod dilated; RA mildly dilated    FAMHx:  Family History  Problem Relation Age of Onset  . Heart disease Mother   . Arthritis Mother     SOCHx:   reports that she is a non-smoker but has been exposed to tobacco smoke. She has never used smokeless tobacco. She reports that she does not drink alcohol or use drugs.  ALLERGIES:  No Known Allergies  ROS: Pertinent items noted in HPI and remainder of comprehensive ROS otherwise negative.  HOME MEDS: Current Outpatient Prescriptions  Medication Sig Dispense Refill  . albuterol (PROVENTIL HFA;VENTOLIN HFA) 108 (90 Base) MCG/ACT inhaler Inhale 2 puffs into the lungs every 6 (six) hours as needed for wheezing or shortness of breath. 3 Inhaler 1  . apixaban (ELIQUIS) 2.5 MG TABS tablet Take 1 tablet (2.5 mg total) by mouth 2 (two) times daily. 180 tablet 0  . aspirin EC 81 MG tablet Take 81 mg by mouth daily.    Marland Kitchen atorvastatin (LIPITOR) 80 MG tablet take 1 tablet by mouth at bedtime 90 tablet 1  . calcitRIOL (ROCALTROL) 0.25 MCG capsule take 1 capsule by mouth EVERY MONDAY, WEDNESDAY AND FRIDAY 45 capsule 1  . Calcium Carbonate-Vitamin D (CALCIUM 600+D) 600-200 MG-UNIT TABS Take 1 tablet by mouth daily.     . carvedilol (COREG) 6.25 MG tablet Take 1 tablet (6.25 mg total) by mouth 2 (two) times daily with a meal. Take 1 tablet by mouth two  times daily with meals 180 tablet 1  . Chromium-Cinnamon (CINNAMON PLUS CHROMIUM PO) Take 1 capsule by mouth 2 (two) times daily. 48mcg/500mg     . docusate sodium (COLACE) 100 MG capsule Take 2 capsules (200 mg total) by mouth 2 (two) times daily. Reported on 12/20/2015 60 capsule 2  . ELIQUIS 2.5 MG TABS tablet take 1 tablet by mouth twice a day *MUST MAKE APPT FOR FURTHER REFILLS 30 tablet 0  . loratadine (CLARITIN) 10 MG tablet Take 1 tablet (10 mg total) by mouth daily.  30 tablet 11  . losartan (COZAAR) 50 MG tablet Take 1 tablet (50 mg total) by mouth daily. Call office for appointment, no refills until seen 90 tablet 0  . Omega-3 Fatty Acids (FISH OIL) 1000 MG CAPS Take 1 capsule by mouth 2 (two) times daily.     . Polyvinyl Alcohol-Povidone (REFRESH OP) Place 1 drop into both eyes 3 (three) times daily as needed (dry eyes).    . QUEtiapine (SEROQUEL) 50 MG tablet Take 1 tablet (50 mg total) by mouth at bedtime. 30 tablet 1  . ranitidine (ZANTAC) 150 MG tablet take 1 tablet by mouth twice a day 180 tablet 1  . senna-docusate (SENOKOT-S) 8.6-50 MG tablet Take 1 tablet by mouth 2 (two) times daily as needed for mild constipation. 60 tablet 2  . vitamin B-12 (CYANOCOBALAMIN) 100 MCG tablet Take 1 tablet (100 mcg  total) by mouth daily. 30 tablet 3  . vitamin C (ASCORBIC ACID) 500 MG tablet Take 500 mg by mouth 2 (two) times daily.     No current facility-administered medications for this visit.     LABS/IMAGING: No results found for this or any previous visit (from the past 48 hour(s)). No results found.  VITALS: BP (!) 142/60   Pulse (!) 54   Ht 5\' 7"  (1.702 m)   Wt 143 lb 12.8 oz (65.2 kg)   BMI 22.52 kg/m   EXAM: General appearance: alert and no distress Neck: no carotid bruit and no JVD Lungs: clear to auscultation bilaterally Heart: irregularly irregular rhythm and systolic murmur: holosystolic 3/6, crescendo at apex Abdomen: soft, non-tender; bowel sounds normal; no masses,  no organomegaly Extremities: edema trace LLE edema Pulses: 2+ and symmetric Skin: Skin color, texture, turgor normal. No rashes or lesions Neurologic: Grossly normal Psych: Mood, affect normal  EKG: Atrial fibrillation with slow ventricular response of 54, LBBB-personally reviewed  ASSESSMENT: 1. Persistent if not permanent atrial fibrillation - CHADSVASC of 5 (anticoagulated on Eliquis) 2. Left bundle-branch block 3. Hypertension 4. Dyslipidemia 5. Mitral  murmur  PLAN: 1.   Mrs. Sivertson is asymptomatic. She is tolerating Eliquis without bleeding problems. Left bundle branch block is stable. Blood pressure is at goal. Her cholesterol is been treated. EF is normal based on echo last year. Overall she seems to be doing well. We'll continue her current medicines and plan to see her back annually or sooner as necessary.  Pixie Casino, MD, [facility] Attending Cardiologist [place] 02/24/2017, 10:57 AM

## 2017-02-24 NOTE — Patient Instructions (Signed)
Your physician wants you to follow-up in: ONE YEAR with Dr. Hilty. You will receive a reminder letter in the mail two months in advance. If you don't receive a letter, please call our office to schedule the follow-up appointment.  

## 2017-03-03 ENCOUNTER — Ambulatory Visit (INDEPENDENT_AMBULATORY_CARE_PROVIDER_SITE_OTHER): Payer: Medicare Other | Admitting: Internal Medicine

## 2017-03-03 ENCOUNTER — Encounter: Payer: Self-pay | Admitting: Internal Medicine

## 2017-03-03 VITALS — BP 158/64 | HR 52 | Temp 97.8°F | Wt 145.0 lb

## 2017-03-03 DIAGNOSIS — F0151 Vascular dementia with behavioral disturbance: Secondary | ICD-10-CM | POA: Diagnosis not present

## 2017-03-03 DIAGNOSIS — F01518 Vascular dementia, unspecified severity, with other behavioral disturbance: Secondary | ICD-10-CM

## 2017-03-03 MED ORDER — QUETIAPINE FUMARATE 50 MG PO TABS: 50.0000 mg | ORAL_TABLET | Freq: Every day | ORAL | 1 refills | Status: DC

## 2017-03-03 NOTE — Patient Instructions (Addendum)
Please try to take serquel at night, to help her sleep through the night. Please make sure you get that Advance Directive in place for future reference/

## 2017-03-03 NOTE — Assessment & Plan Note (Addendum)
Discussed taking Seroquel nightly to ensure patient's sleeping and to help with any behavioral issues. Husband now in agreement and states he will try this. Noted patient was walking in without a walker and will likely significantly unstable gait. Patient has a walker at home was encouraged to continue to use this. -Discussed advanced directives with the patient and has been in the importance of having this on file as well as talking with family

## 2017-03-03 NOTE — Progress Notes (Signed)
   [name] Kerrin Mo, MD Phone: [phone]  Reason For Visit: Dementia Follow up   # Dementia -Patient previously having trouble with the behavior. Has in the past been violent and aggressive. Her husband this has since resolved. Patient has been given Haldol which did not work previously. This was therefore stopped. Patient was started on Seroquel about a month ago. However as it has not been giving this to patient consistently. Husband does not have as particular reason for this, states that the couple of times he gave it to her a couple of times and did not see a huge difference. Explained to husband that this medication is meant to be given daily, as patient has had significant trouble with sleeping at night and often wakes her husband up at night. Husband denies any issues with maintaining patient's care.   Past Medical History Reviewed problem list.  Medications- reviewed and updated No additions to family history Social history- patient is a non-smoker  Objective: BP (!) 158/64   Pulse (!) 52   Temp 97.8 F (36.6 C) (Oral)   Wt 145 lb (65.8 kg)   SpO2 98%   BMI 22.71 kg/m  Gen: NAD, alert, cooperative with exam Cardio: regular rate and rhythm, S1S2 heard, no murmurs appreciated Pulm: clear to auscultation bilaterally, no wheezes, rhonchi or rales MSK: Slight unstable gait     Assessment/Plan: See problem based a/p  Dementia, vascular, mixed, with behavioral disturbance Discussed taking Seroquel nightly to ensure patient's sleeping and to help with any behavioral issues. Husband now in agreement and states he will try this. Noted patient was walking in without a walker and will likely significantly unstable gait. Patient has a walker at home was encouraged to continue to use this. -Discussed advanced directives with the patient and has been in the importance of having this on file as well as talking with family

## 2017-03-05 ENCOUNTER — Emergency Department (HOSPITAL_COMMUNITY): Payer: Medicare Other

## 2017-03-05 ENCOUNTER — Inpatient Hospital Stay (HOSPITAL_COMMUNITY)
Admission: EM | Admit: 2017-03-05 | Discharge: 2017-03-12 | DRG: 470 | Disposition: A | Discharge status: Skilled Nursing Facility | Payer: Medicare Other | Attending: Family Medicine | Admitting: Family Medicine

## 2017-03-05 ENCOUNTER — Encounter (HOSPITAL_COMMUNITY): Payer: Self-pay

## 2017-03-05 DIAGNOSIS — Z7901 Long term (current) use of anticoagulants: Secondary | ICD-10-CM

## 2017-03-05 DIAGNOSIS — S8990XA Unspecified injury of unspecified lower leg, initial encounter: Secondary | ICD-10-CM | POA: Diagnosis not present

## 2017-03-05 DIAGNOSIS — D49519 Neoplasm of unspecified behavior of unspecified kidney: Secondary | ICD-10-CM | POA: Diagnosis present

## 2017-03-05 DIAGNOSIS — R339 Retention of urine, unspecified: Secondary | ICD-10-CM | POA: Diagnosis present

## 2017-03-05 DIAGNOSIS — Z9841 Cataract extraction status, right eye: Secondary | ICD-10-CM

## 2017-03-05 DIAGNOSIS — Z9181 History of falling: Secondary | ICD-10-CM | POA: Diagnosis not present

## 2017-03-05 DIAGNOSIS — S72011A Unspecified intracapsular fracture of right femur, initial encounter for closed fracture: Principal | ICD-10-CM | POA: Diagnosis present

## 2017-03-05 DIAGNOSIS — R000 Tachycardia, unspecified: Secondary | ICD-10-CM | POA: Diagnosis not present

## 2017-03-05 DIAGNOSIS — N179 Acute kidney failure, unspecified: Secondary | ICD-10-CM | POA: Diagnosis not present

## 2017-03-05 DIAGNOSIS — Z8261 Family history of arthritis: Secondary | ICD-10-CM

## 2017-03-05 DIAGNOSIS — S72041A Displaced fracture of base of neck of right femur, initial encounter for closed fracture: Secondary | ICD-10-CM | POA: Diagnosis not present

## 2017-03-05 DIAGNOSIS — I10 Essential (primary) hypertension: Secondary | ICD-10-CM

## 2017-03-05 DIAGNOSIS — Z79899 Other long term (current) drug therapy: Secondary | ICD-10-CM

## 2017-03-05 DIAGNOSIS — F01518 Vascular dementia, unspecified severity, with other behavioral disturbance: Secondary | ICD-10-CM | POA: Insufficient documentation

## 2017-03-05 DIAGNOSIS — I130 Hypertensive heart and chronic kidney disease with heart failure and stage 1 through stage 4 chronic kidney disease, or unspecified chronic kidney disease: Secondary | ICD-10-CM | POA: Diagnosis present

## 2017-03-05 DIAGNOSIS — Y92009 Unspecified place in unspecified non-institutional (private) residence as the place of occurrence of the external cause: Secondary | ICD-10-CM | POA: Diagnosis not present

## 2017-03-05 DIAGNOSIS — S72001A Fracture of unspecified part of neck of right femur, initial encounter for closed fracture: Secondary | ICD-10-CM

## 2017-03-05 DIAGNOSIS — D72829 Elevated white blood cell count, unspecified: Secondary | ICD-10-CM | POA: Diagnosis present

## 2017-03-05 DIAGNOSIS — I480 Paroxysmal atrial fibrillation: Secondary | ICD-10-CM | POA: Diagnosis present

## 2017-03-05 DIAGNOSIS — I872 Venous insufficiency (chronic) (peripheral): Secondary | ICD-10-CM | POA: Diagnosis not present

## 2017-03-05 DIAGNOSIS — E860 Dehydration: Secondary | ICD-10-CM | POA: Diagnosis not present

## 2017-03-05 DIAGNOSIS — D631 Anemia in chronic kidney disease: Secondary | ICD-10-CM | POA: Diagnosis present

## 2017-03-05 DIAGNOSIS — R2689 Other abnormalities of gait and mobility: Secondary | ICD-10-CM | POA: Diagnosis not present

## 2017-03-05 DIAGNOSIS — J449 Chronic obstructive pulmonary disease, unspecified: Secondary | ICD-10-CM | POA: Diagnosis not present

## 2017-03-05 DIAGNOSIS — E538 Deficiency of other specified B group vitamins: Secondary | ICD-10-CM | POA: Diagnosis not present

## 2017-03-05 DIAGNOSIS — N183 Chronic kidney disease, stage 3 (moderate): Secondary | ICD-10-CM | POA: Diagnosis present

## 2017-03-05 DIAGNOSIS — I5030 Unspecified diastolic (congestive) heart failure: Secondary | ICD-10-CM | POA: Diagnosis not present

## 2017-03-05 DIAGNOSIS — E785 Hyperlipidemia, unspecified: Secondary | ICD-10-CM | POA: Diagnosis present

## 2017-03-05 DIAGNOSIS — K219 Gastro-esophageal reflux disease without esophagitis: Secondary | ICD-10-CM | POA: Diagnosis present

## 2017-03-05 DIAGNOSIS — Z7982 Long term (current) use of aspirin: Secondary | ICD-10-CM

## 2017-03-05 DIAGNOSIS — M6281 Muscle weakness (generalized): Secondary | ICD-10-CM | POA: Diagnosis not present

## 2017-03-05 DIAGNOSIS — Z9842 Cataract extraction status, left eye: Secondary | ICD-10-CM

## 2017-03-05 DIAGNOSIS — E1122 Type 2 diabetes mellitus with diabetic chronic kidney disease: Secondary | ICD-10-CM | POA: Diagnosis present

## 2017-03-05 DIAGNOSIS — I482 Chronic atrial fibrillation: Secondary | ICD-10-CM | POA: Diagnosis not present

## 2017-03-05 DIAGNOSIS — Z66 Do not resuscitate: Secondary | ICD-10-CM | POA: Diagnosis present

## 2017-03-05 DIAGNOSIS — N182 Chronic kidney disease, stage 2 (mild): Secondary | ICD-10-CM | POA: Diagnosis not present

## 2017-03-05 DIAGNOSIS — J302 Other seasonal allergic rhinitis: Secondary | ICD-10-CM | POA: Diagnosis not present

## 2017-03-05 DIAGNOSIS — S72009A Fracture of unspecified part of neck of unspecified femur, initial encounter for closed fracture: Secondary | ICD-10-CM | POA: Diagnosis present

## 2017-03-05 DIAGNOSIS — D696 Thrombocytopenia, unspecified: Secondary | ICD-10-CM | POA: Diagnosis not present

## 2017-03-05 DIAGNOSIS — Z961 Presence of intraocular lens: Secondary | ICD-10-CM | POA: Diagnosis present

## 2017-03-05 DIAGNOSIS — Z8249 Family history of ischemic heart disease and other diseases of the circulatory system: Secondary | ICD-10-CM

## 2017-03-05 DIAGNOSIS — D62 Acute posthemorrhagic anemia: Secondary | ICD-10-CM | POA: Diagnosis not present

## 2017-03-05 DIAGNOSIS — I481 Persistent atrial fibrillation: Secondary | ICD-10-CM

## 2017-03-05 DIAGNOSIS — N2581 Secondary hyperparathyroidism of renal origin: Secondary | ICD-10-CM | POA: Diagnosis present

## 2017-03-05 DIAGNOSIS — S72111A Displaced fracture of greater trochanter of right femur, initial encounter for closed fracture: Secondary | ICD-10-CM | POA: Diagnosis not present

## 2017-03-05 DIAGNOSIS — M25551 Pain in right hip: Secondary | ICD-10-CM | POA: Diagnosis not present

## 2017-03-05 DIAGNOSIS — S72001D Fracture of unspecified part of neck of right femur, subsequent encounter for closed fracture with routine healing: Secondary | ICD-10-CM | POA: Diagnosis not present

## 2017-03-05 DIAGNOSIS — I5032 Chronic diastolic (congestive) heart failure: Secondary | ICD-10-CM | POA: Diagnosis present

## 2017-03-05 DIAGNOSIS — E213 Hyperparathyroidism, unspecified: Secondary | ICD-10-CM | POA: Diagnosis not present

## 2017-03-05 DIAGNOSIS — R34 Anuria and oliguria: Secondary | ICD-10-CM | POA: Diagnosis not present

## 2017-03-05 DIAGNOSIS — S72011D Unspecified intracapsular fracture of right femur, subsequent encounter for closed fracture with routine healing: Secondary | ICD-10-CM | POA: Diagnosis not present

## 2017-03-05 DIAGNOSIS — Z9049 Acquired absence of other specified parts of digestive tract: Secondary | ICD-10-CM

## 2017-03-05 DIAGNOSIS — F0151 Vascular dementia with behavioral disturbance: Secondary | ICD-10-CM | POA: Diagnosis not present

## 2017-03-05 DIAGNOSIS — R451 Restlessness and agitation: Secondary | ICD-10-CM | POA: Diagnosis not present

## 2017-03-05 DIAGNOSIS — S72001S Fracture of unspecified part of neck of right femur, sequela: Secondary | ICD-10-CM | POA: Diagnosis not present

## 2017-03-05 DIAGNOSIS — W1830XA Fall on same level, unspecified, initial encounter: Secondary | ICD-10-CM | POA: Diagnosis present

## 2017-03-05 DIAGNOSIS — I700 Atherosclerosis of aorta: Secondary | ICD-10-CM | POA: Diagnosis not present

## 2017-03-05 DIAGNOSIS — R9431 Abnormal electrocardiogram [ECG] [EKG]: Secondary | ICD-10-CM | POA: Diagnosis not present

## 2017-03-05 DIAGNOSIS — I429 Cardiomyopathy, unspecified: Secondary | ICD-10-CM | POA: Diagnosis not present

## 2017-03-05 DIAGNOSIS — S92153A Displaced avulsion fracture (chip fracture) of unspecified talus, initial encounter for closed fracture: Secondary | ICD-10-CM | POA: Diagnosis not present

## 2017-03-05 DIAGNOSIS — I739 Peripheral vascular disease, unspecified: Secondary | ICD-10-CM | POA: Diagnosis present

## 2017-03-05 DIAGNOSIS — W19XXXA Unspecified fall, initial encounter: Secondary | ICD-10-CM

## 2017-03-05 DIAGNOSIS — Z96641 Presence of right artificial hip joint: Secondary | ICD-10-CM | POA: Diagnosis not present

## 2017-03-05 DIAGNOSIS — M80851D Other osteoporosis with current pathological fracture, right femur, subsequent encounter for fracture with routine healing: Secondary | ICD-10-CM | POA: Diagnosis not present

## 2017-03-05 DIAGNOSIS — I447 Left bundle-branch block, unspecified: Secondary | ICD-10-CM | POA: Diagnosis not present

## 2017-03-05 DIAGNOSIS — F0281 Dementia in other diseases classified elsewhere with behavioral disturbance: Secondary | ICD-10-CM | POA: Diagnosis not present

## 2017-03-05 DIAGNOSIS — Z90710 Acquired absence of both cervix and uterus: Secondary | ICD-10-CM

## 2017-03-05 DIAGNOSIS — Z4781 Encounter for orthopedic aftercare following surgical amputation: Secondary | ICD-10-CM | POA: Diagnosis not present

## 2017-03-05 DIAGNOSIS — I129 Hypertensive chronic kidney disease with stage 1 through stage 4 chronic kidney disease, or unspecified chronic kidney disease: Secondary | ICD-10-CM | POA: Diagnosis not present

## 2017-03-05 DIAGNOSIS — Z981 Arthrodesis status: Secondary | ICD-10-CM

## 2017-03-05 DIAGNOSIS — Z4789 Encounter for other orthopedic aftercare: Secondary | ICD-10-CM | POA: Diagnosis not present

## 2017-03-05 LAB — BASIC METABOLIC PANEL
Anion gap: 9 (ref 5–15)
BUN: 29 mg/dL — ABNORMAL HIGH (ref 6–20)
CO2: 23 mmol/L (ref 22–32)
Calcium: 9.5 mg/dL (ref 8.9–10.3)
Chloride: 109 mmol/L (ref 101–111)
Creatinine, Ser: 1.34 mg/dL — ABNORMAL HIGH (ref 0.44–1.00)
GFR calc Af Amer: 40 mL/min — ABNORMAL LOW (ref >60)
GFR calc non Af Amer: 35 mL/min — ABNORMAL LOW (ref >60)
Glucose, Bld: 144 mg/dL — ABNORMAL HIGH (ref 65–99)
Potassium: 4.6 mmol/L (ref 3.5–5.1)
Sodium: 141 mmol/L (ref 135–145)

## 2017-03-05 LAB — URINALYSIS, ROUTINE W REFLEX MICROSCOPIC
Bacteria, UA: NONE SEEN
Bilirubin Urine: NEGATIVE
Glucose, UA: NEGATIVE mg/dL
Ketones, ur: NEGATIVE mg/dL
Nitrite: NEGATIVE
Protein, ur: 100 mg/dL — AB
Specific Gravity, Urine: 1.013 (ref 1.005–1.030)
pH: 5 (ref 5.0–8.0)

## 2017-03-05 LAB — CBC WITH DIFFERENTIAL/PLATELET
Basophils Absolute: 0 10*3/uL (ref 0.0–0.1)
Basophils Relative: 0 %
Eosinophils Absolute: 0.1 10*3/uL (ref 0.0–0.7)
Eosinophils Relative: 0 %
HCT: 35.5 % — ABNORMAL LOW (ref 36.0–46.0)
Hemoglobin: 11.2 g/dL — ABNORMAL LOW (ref 12.0–15.0)
Lymphocytes Relative: 6 %
Lymphs Abs: 1.1 10*3/uL (ref 0.7–4.0)
MCH: 30.6 pg (ref 26.0–34.0)
MCHC: 31.5 g/dL (ref 30.0–36.0)
MCV: 97 fL (ref 78.0–100.0)
Monocytes Absolute: 0.7 10*3/uL (ref 0.1–1.0)
Monocytes Relative: 4 %
Neutro Abs: 15 10*3/uL — ABNORMAL HIGH (ref 1.7–7.7)
Neutrophils Relative %: 90 %
Platelets: 207 10*3/uL (ref 150–400)
RBC: 3.66 MIL/uL — ABNORMAL LOW (ref 3.87–5.11)
RDW: 13.9 % (ref 11.5–15.5)
WBC: 16.9 10*3/uL — ABNORMAL HIGH (ref 4.0–10.5)

## 2017-03-05 LAB — TROPONIN I: Troponin I: <0.03 ng/mL (ref <0.03)

## 2017-03-05 LAB — GLUCOSE, CAPILLARY: GLUCOSE-CAPILLARY: 167 mg/dL — AB (ref 65–99)

## 2017-03-05 MED ORDER — SODIUM CHLORIDE 0.9 % IV SOLN
INTRAVENOUS | Status: DC
  Administered 2017-03-05: 10:00:00 via INTRAVENOUS

## 2017-03-05 MED ORDER — CARVEDILOL 6.25 MG PO TABS
6.2500 mg | ORAL_TABLET | Freq: Two times a day (BID) | ORAL | Status: DC
  Administered 2017-03-05 – 2017-03-07 (×4): 6.25 mg via ORAL
  Filled 2017-03-05 (×4): qty 1

## 2017-03-05 MED ORDER — MORPHINE SULFATE (PF) 2 MG/ML IV SOLN
0.5000 mg | INTRAVENOUS | Status: DC | PRN
  Administered 2017-03-06 – 2017-03-11 (×6): 0.5 mg via INTRAVENOUS
  Filled 2017-03-05 (×6): qty 1

## 2017-03-05 MED ORDER — DOCUSATE SODIUM 100 MG PO CAPS
200.0000 mg | ORAL_CAPSULE | Freq: Two times a day (BID) | ORAL | Status: DC
  Administered 2017-03-05 – 2017-03-07 (×5): 200 mg via ORAL
  Filled 2017-03-05 (×5): qty 2

## 2017-03-05 MED ORDER — CALCITRIOL 0.25 MCG PO CAPS
0.2500 ug | ORAL_CAPSULE | ORAL | Status: DC
  Administered 2017-03-06 – 2017-03-11 (×3): 0.25 ug via ORAL
  Filled 2017-03-05 (×3): qty 1

## 2017-03-05 MED ORDER — LORAZEPAM 2 MG/ML IJ SOLN
1.0000 mg | Freq: Once | INTRAMUSCULAR | Status: AC
Start: 2017-03-05 — End: 2017-03-05
  Administered 2017-03-05: 1 mg via INTRAMUSCULAR
  Filled 2017-03-05: qty 1

## 2017-03-05 MED ORDER — POTASSIUM CHLORIDE IN NACL 20-0.9 MEQ/L-% IV SOLN
INTRAVENOUS | Status: DC
  Administered 2017-03-05 – 2017-03-06 (×2): via INTRAVENOUS
  Filled 2017-03-05 (×2): qty 1000

## 2017-03-05 MED ORDER — VITAMIN B-12 100 MCG PO TABS
100.0000 ug | ORAL_TABLET | Freq: Every day | ORAL | Status: DC
  Administered 2017-03-05 – 2017-03-12 (×7): 100 ug via ORAL
  Filled 2017-03-05 (×8): qty 1

## 2017-03-05 MED ORDER — FAMOTIDINE 20 MG PO TABS
10.0000 mg | ORAL_TABLET | Freq: Two times a day (BID) | ORAL | Status: DC
  Administered 2017-03-05 – 2017-03-12 (×11): 10 mg via ORAL
  Filled 2017-03-05 (×14): qty 1

## 2017-03-05 MED ORDER — ATORVASTATIN CALCIUM 40 MG PO TABS
80.0000 mg | ORAL_TABLET | Freq: Every day | ORAL | Status: DC
  Administered 2017-03-05 – 2017-03-11 (×5): 80 mg via ORAL
  Filled 2017-03-05: qty 2
  Filled 2017-03-05: qty 1
  Filled 2017-03-05 (×3): qty 2
  Filled 2017-03-05: qty 1
  Filled 2017-03-05 (×3): qty 2
  Filled 2017-03-05: qty 1

## 2017-03-05 MED ORDER — SENNOSIDES-DOCUSATE SODIUM 8.6-50 MG PO TABS: 1.0000 | ORAL_TABLET | Freq: Two times a day (BID) | ORAL | Status: DC | PRN

## 2017-03-05 MED ORDER — HYDROCODONE-ACETAMINOPHEN 5-325 MG PO TABS
1.0000 | ORAL_TABLET | Freq: Four times a day (QID) | ORAL | Status: DC | PRN
  Administered 2017-03-05 – 2017-03-06 (×3): 1 via ORAL
  Filled 2017-03-05 (×3): qty 1

## 2017-03-05 MED ORDER — LORATADINE 10 MG PO TABS
10.0000 mg | ORAL_TABLET | Freq: Every day | ORAL | Status: DC
  Administered 2017-03-05 – 2017-03-12 (×8): 10 mg via ORAL
  Filled 2017-03-05 (×8): qty 1

## 2017-03-05 MED ORDER — MORPHINE SULFATE (PF) 4 MG/ML IV SOLN: 4.0000 mg | INTRAVENOUS | Status: DC | PRN

## 2017-03-05 MED ORDER — MORPHINE SULFATE (PF) 4 MG/ML IV SOLN
4.0000 mg | Freq: Once | INTRAVENOUS | Status: AC
  Administered 2017-03-05: 4 mg via INTRAMUSCULAR
  Filled 2017-03-05: qty 1

## 2017-03-05 MED ORDER — HALOPERIDOL LACTATE 5 MG/ML IJ SOLN
2.5000 mg | Freq: Four times a day (QID) | INTRAMUSCULAR | Status: DC | PRN
  Administered 2017-03-05: 2.5 mg via INTRAMUSCULAR
  Filled 2017-03-05: qty 1

## 2017-03-05 MED ORDER — HALOPERIDOL LACTATE 5 MG/ML IJ SOLN
1.0000 mg | Freq: Four times a day (QID) | INTRAMUSCULAR | Status: AC | PRN
  Administered 2017-03-07 – 2017-03-09 (×3): 1 mg via INTRAVENOUS
  Filled 2017-03-05 (×3): qty 1

## 2017-03-05 MED ORDER — HALOPERIDOL 0.5 MG PO TABS
0.5000 mg | ORAL_TABLET | Freq: Three times a day (TID) | ORAL | Status: DC | PRN
  Administered 2017-03-11: 0.5 mg via ORAL
  Filled 2017-03-05: qty 0.5

## 2017-03-05 MED ORDER — LOSARTAN POTASSIUM 50 MG PO TABS
50.0000 mg | ORAL_TABLET | Freq: Every day | ORAL | Status: DC
  Administered 2017-03-05 – 2017-03-09 (×5): 50 mg via ORAL
  Filled 2017-03-05 (×5): qty 1

## 2017-03-05 MED ORDER — QUETIAPINE FUMARATE 25 MG PO TABS
50.0000 mg | ORAL_TABLET | Freq: Every day | ORAL | Status: DC
  Administered 2017-03-05 – 2017-03-11 (×5): 50 mg via ORAL
  Filled 2017-03-05: qty 2
  Filled 2017-03-05: qty 1
  Filled 2017-03-05 (×4): qty 2
  Filled 2017-03-05 (×2): qty 1
  Filled 2017-03-05 (×2): qty 2

## 2017-03-05 MED ORDER — IPRATROPIUM-ALBUTEROL 0.5-2.5 (3) MG/3ML IN SOLN: 3.0000 mL | RESPIRATORY_TRACT | Status: DC | PRN

## 2017-03-05 NOTE — ED Notes (Signed)
Husband cell-[name] would like a call if wife is moved or treatment changes.  [phone]

## 2017-03-05 NOTE — ED Notes (Signed)
ICE PACK APPLIED.

## 2017-03-05 NOTE — Consult Note (Signed)
Patient ID: [name] MRN: [id number] DOB/AGE: June 03, 1928 83 y.o.  Admit date: 03/05/2017  Admission Diagnoses:  Active Problems:   * No active hospital problems. *   HPI: 83 year old female pt with a past med history of Dementia, CKD, COPD, CHF, DM.  History is very difficult because of the pts dementia.  The husband has left the ED.  It is reported that the husband found her lying in the floor at 430 am.  Her husband and grandson helped her into the [facility].  She was complaining of R Hip pain and EMS was called.   Past Medical History: Past Medical History:  Diagnosis Date  . A-fib ([place])   . Anemia    anemia of renal disease  . Arthritis    mutiple joints  . Cancer [facility])    Renal neoplasm- survellience via Urology  . CHF (congestive heart failure) ([name])    Diastolic dys-- EF 17% july 2011  . Chronic kidney disease    Stage III- followed by [facility]  . COPD (chronic obstructive pulmonary disease) ([place])   . Dementia, vascular, mixed, with behavioral disturbance 01/31/2016  . Diabetes mellitus   . DIASTOLIC DYSFUNCTION 4/94/4967   Qualifier: Diagnosis of  By: Buelah Manis MD, Lonell Grandchild    . GERD (gastroesophageal reflux disease)   . H/O hiatal hernia   . Hyperlipidemia   . Hypertension   . Left bundle branch block   . NEOPLASM, KIDNEY 03/04/2007   complex right renal cyst; Per patient- Urologist no longer following- no changes.      . Nonischemic cardiomyopathy ([place])   . Peripheral neuropathy   . Pneumonia   . Shortness of breath    Hx: of with exertion  . Venous insufficiency 02/24/2011    Surgical History: Past Surgical History:  Procedure Laterality Date  . ABDOMINAL HYSTERECTOMY    . CARDIAC CATHETERIZATION  08/12/2011   heavily calcified L main; LAD with no stenosis; 1st diagonal free of stenosis; L Cfx with no stenosis; OM1/2 without stenosis; PDA without stenosis; RCA without significant stenosis; non-ischemic cardiomyopathy (Dr. Kathy Breach)  . CATARACT  EXTRACTION W/ INTRAOCULAR LENS  IMPLANT, BILATERAL    . CHOLECYSTECTOMY  1987  . DILATION AND CURETTAGE OF UTERUS    . LEFT HEART CATHETERIZATION WITH CORONARY ANGIOGRAM N/A 08/12/2011   Procedure: LEFT HEART CATHETERIZATION WITH CORONARY ANGIOGRAM;  Surgeon: Pixie Casino, MD;  Location: [facility] CATH LAB;  Service: Cardiovascular;  Laterality: N/A;  . LUMBAR LAMINECTOMY/DECOMPRESSION MICRODISCECTOMY Left 05/12/2013   Procedure: Left Lumbar four-five laminotomy and microdiskectomy;  Surgeon: Hosie Spangle, MD;  Location: [place] [facility] ORS;  Service: Neurosurgery;  Laterality: Left;  Left Lumbar four-five laminotomy and microdiskectomy  . NM MYOCAR PERF WALL MOTION  05/2011   lexiscan myoview - fixed septal defect r/t LBBB; normal pattern of perfusion in all regions; low risk scan  . TONSILLECTOMY    . TRANSTHORACIC ECHOCARDIOGRAM  09/2011   EF 60-65%; MV mod-severely calcified, mildly thickened MV annulus, mild MR; LA mod dilated; RA mildly dilated    Family History: Family History  Problem Relation Age of Onset  . Heart disease Mother   . Arthritis Mother     Social History: Social History   Social History  . Marital status: Married    Spouse name: N/A  . Number of children: 3  . Years of education: 10   Occupational History  .  Retired   Social History Main Topics  . Smoking  status: Passive Smoke Exposure - Never Smoker  . Smokeless tobacco: Never Used  . Alcohol use No  . Drug use: No  . Sexual activity: Not on file   Other Topics Concern  . Not on file   Social History Narrative   01/31/16 Dr. Emmaline Life       Patient has faith in God and plays a large part in how she deals with problems. However, does not attend church regularly.    Lives with husband, Hodan Wurtz, who does all driving and her pill box.       Patient has 3 children    1. Hammond    2. Badger   3. [name] (deceased)     Allergies: Patient has no known  allergies.  Medications: I have reviewed the patient's current medications.  Vital Signs: Patient Vitals for the past 24 hrs:  BP Pulse Resp SpO2  03/05/17 0837 140/80 66 20 93 %  03/05/17 0833 - - - 93 %    Radiology: Dg Chest 1 View  Result Date: 03/05/2017 CLINICAL DATA:  Examination. No current chest complaints. History of CHF, COPD, hypertension, diabetes. EXAM: CHEST 1 VIEW COMPARISON:  PA and lateral chest x-ray of May 12, 2013 FINDINGS: The lungs are well-expanded. There is no focal infiltrate. There is no pleural effusion. The cardiac silhouette is enlarged. The pulmonary vascularity is not engorged. There is calcification in the wall of the thoracic aorta. There is multilevel degenerative disc disease of the thoracic spine. There are degenerative changes of the right shoulder. IMPRESSION: There is mild enlarged of the cardiac silhouette without significant pulmonary vascular congestion or pulmonary edema. No acute pneumonia. Thoracic aortic atherosclerosis. Electronically Signed   By: David  Martinique M.D.   On: 03/05/2017 09:18   Dg Hip Unilat With Pelvis 2-3 Views Right  Result Date: 03/05/2017 CLINICAL DATA:  Found on the floor by patient's husband complaining of severe right hip pain with obvious deformity. EXAM: DG HIP (WITH OR WITHOUT PELVIS) 2-3V RIGHT COMPARISON:  No recent studies in [facility] FINDINGS: The patient has sustained an acute subcapital fracture of the right hip. There is mild superior migration of the femoral neck and intertrochanteric region. There appears to be a fracture through the greater trochanter as well. The bony pelvis is intact though subjectively osteopenic. The pubic rami appear intact. There are degenerative changes of the lower lumbar spine. The left hip is grossly normal. IMPRESSION: The patient has sustained an acute subcapital fracture of the right hip. A fracture extends through the greater trochanter as well. Electronically Signed   By: David  Martinique  M.D.   On: 03/05/2017 09:17    Labs:  Recent Labs  03/05/17 0932  WBC 16.9*  RBC 3.66*  HCT 35.5*  PLT 207    Recent Labs  03/05/17 0932  NA 141  K 4.6  CL 109  CO2 23  BUN 29*  CREATININE 1.34*  GLUCOSE 144*  CALCIUM 9.5   No results for input(s): LABPT, INR in the last 72 hours.  Review of Systems: ROS  Physical Exam: Unable to perform PE because the pt was agitated and would not answer questions  Assessment and Plan: Acute Subcapital fracture of the right hip  And greater trochanter NWB RLE Hold Eliquis Will determine course of action with surgical intervention in the near future Admit pt under Hospitalist Team   [place], [facility] for Melina Schools, MD [place] [phone]

## 2017-03-05 NOTE — ED Notes (Signed)
Pt keeps removing cardiac monitoring, EMT's have put it back on several times, each time removed after leaving room.

## 2017-03-05 NOTE — ED Notes (Signed)
Pt keeps removing her O2.

## 2017-03-05 NOTE — ED Notes (Signed)
Bed: [facility] Expected date:  Expected time:  Means of arrival:  Comments: 83 yo f, unwitnessed fall, right knee pain

## 2017-03-05 NOTE — H&P (Signed)
History and Physical    Nancy PARRALES [name]:[id number] DOB: 09-23-1927 DOA: 03/05/2017  PCP: Tonette Bihari, MD  Patient coming from:Home  Chief Complaint:fall and hip pain  HPI: [name] is a 83 y.o. female with medical history significant of chronic atrial fibrillation, renal neoplasm, diastolic congestive heart failure chronic, COPD, vascular/mixed dementia with behavioral disturbance, left bundle branch block, hypertension, hyperlipidemia presented from home when she was found in the floor by her husband. As per patient has been seen in the setting in the living room around 4:30 in the morning. Patient was complaining of hip pain. She has dementia. Patient was with the pain and mild agitation in the ER they were unable to obtain history from the patient.  ED Course: In the ER patient with pain, agitation. X-ray of hip with right hip fracture. Orthopedics consulted recommended to hold eliquis and admission to hospitalist service. Patient pulled out her IV line and monitor in the ER. Receiving IM Haldol. Discussed with the ER nurses and ER staffs. Patient's husband does not want her to go through surgery and reported that she is DO NOT RESUSCITATE.  Review of Systems: As per HPI otherwise 10 point review of systems negative.    Past Medical History:  Diagnosis Date  . A-fib ([place])   . Anemia    anemia of renal disease  . Arthritis    mutiple joints  . Cancer [facility])    Renal neoplasm- survellience via Urology  . CHF (congestive heart failure) ([place])    Diastolic dys-- EF 53% july 2011  . Chronic kidney disease    Stage III- followed by [facility]  . COPD (chronic obstructive pulmonary disease) ([place])   . Dementia, vascular, mixed, with behavioral disturbance 01/31/2016  . Diabetes mellitus   . DIASTOLIC DYSFUNCTION 9/76/7341   Qualifier: Diagnosis of  By: Buelah Manis MD, Lonell Grandchild    . GERD (gastroesophageal reflux disease)   . H/O hiatal hernia   . Hyperlipidemia   .  Hypertension   . Left bundle branch block   . NEOPLASM, KIDNEY 03/04/2007   complex right renal cyst; Per patient- Urologist no longer following- no changes.      . Nonischemic cardiomyopathy ([place])   . Peripheral neuropathy   . Pneumonia   . Shortness of breath    Hx: of with exertion  . Venous insufficiency 02/24/2011    Past Surgical History:  Procedure Laterality Date  . ABDOMINAL HYSTERECTOMY    . CARDIAC CATHETERIZATION  08/12/2011   heavily calcified L main; LAD with no stenosis; 1st diagonal free of stenosis; L Cfx with no stenosis; OM1/2 without stenosis; PDA without stenosis; RCA without significant stenosis; non-ischemic cardiomyopathy (Dr. Kathy Breach)  . CATARACT EXTRACTION W/ INTRAOCULAR LENS  IMPLANT, BILATERAL    . CHOLECYSTECTOMY  1987  . DILATION AND CURETTAGE OF UTERUS    . LEFT HEART CATHETERIZATION WITH CORONARY ANGIOGRAM N/A 08/12/2011   Procedure: LEFT HEART CATHETERIZATION WITH CORONARY ANGIOGRAM;  Surgeon: Pixie Casino, MD;  Location: [facility] CATH LAB;  Service: Cardiovascular;  Laterality: N/A;  . LUMBAR LAMINECTOMY/DECOMPRESSION MICRODISCECTOMY Left 05/12/2013   Procedure: Left Lumbar four-five laminotomy and microdiskectomy;  Surgeon: Hosie Spangle, MD;  Location: [facility]UR[facility] ORS;  Service: Neurosurgery;  Laterality: Left;  Left Lumbar four-five laminotomy and microdiskectomy  . NM MYOCAR PERF WALL MOTION  05/2011   lexiscan myoview - fixed septal defect r/t LBBB; normal pattern of perfusion in all regions; low risk scan  . TONSILLECTOMY    .  TRANSTHORACIC ECHOCARDIOGRAM  09/2011   EF 60-65%; MV mod-severely calcified, mildly thickened MV annulus, mild MR; LA mod dilated; RA mildly dilated    Social history: reports that she is a non-smoker but has been exposed to tobacco smoke. She has never used smokeless tobacco. She reports that she does not drink alcohol or use drugs.  No Known Allergies  Family History  Problem Relation Age of Onset  . Heart disease  Mother   . Arthritis Mother      Prior to Admission medications   Medication Sig Start Date End Date Taking? Authorizing Provider  albuterol (PROVENTIL HFA;VENTOLIN HFA) 108 (90 Base) MCG/ACT inhaler Inhale 2 puffs into the lungs every 6 (six) hours as needed for wheezing or shortness of breath. 08/08/15  Yes Rosemarie Ax, MD  apixaban (ELIQUIS) 2.5 MG TABS tablet Take 1 tablet (2.5 mg total) by mouth 2 (two) times daily. 02/19/17  Yes Mikell, Jeani Sow, MD  aspirin EC 81 MG tablet Take 81 mg by mouth daily.   Yes [provider]  atorvastatin (LIPITOR) 80 MG tablet take 1 tablet by mouth at bedtime 01/26/17  Yes Mikell, Jeani Sow, MD  calcitRIOL (ROCALTROL) 0.25 MCG capsule take 1 capsule by mouth EVERY MONDAY, [place] 03/13/16  Yes Mikell, Jeani Sow, MD  carvedilol (COREG) 6.25 MG tablet Take 1 tablet (6.25 mg total) by mouth 2 (two) times daily with a meal. Take 1 tablet by mouth two  times daily with meals 03/14/16  Yes Mikell, Jeani Sow, MD  docusate sodium (COLACE) 100 MG capsule Take 2 capsules (200 mg total) by mouth 2 (two) times daily. Reported on 12/20/2015 08/21/16  Yes Mikell, Jeani Sow, MD  haloperidol (HALDOL) 0.5 MG tablet Take 0.5 mg by mouth 3 (three) times daily as needed for agitation.   Yes [provider]  loratadine (CLARITIN) 10 MG tablet Take 1 tablet (10 mg total) by mouth daily. 10/08/16  Yes Mikell, Jeani Sow, MD  losartan (COZAAR) 50 MG tablet Take 1 tablet (50 mg total) by mouth daily. Call office for appointment, no refills until seen 06/02/16  Yes Hilty, Nadean Corwin, MD  OVER THE COUNTER MEDICATION Take 1 tablet by mouth every 6 (six) hours as needed (pain). Pt's husband orders OTC "pain pills" from TV. Husband cannot verify the name of the med or where he orders them from.   Yes [provider]  QUEtiapine (SEROQUEL) 50 MG tablet Take 1 tablet (50 mg total) by mouth at bedtime. 03/03/17  Yes Mikell, Jeani Sow, MD    ranitidine (ZANTAC) 150 MG tablet take 1 tablet by mouth twice a day 12/15/16  Yes Mikell, Jeani Sow, MD  senna-docusate (SENOKOT-S) 8.6-50 MG tablet Take 1 tablet by mouth 2 (two) times daily as needed for mild constipation. 08/21/16  Yes Mikell, Jeani Sow, MD  vitamin B-12 (CYANOCOBALAMIN) 100 MCG tablet Take 1 tablet (100 mcg total) by mouth daily. 01/22/17  Yes Mikell, Jeani Sow, MD  ELIQUIS 2.5 MG TABS tablet take 1 tablet by mouth twice a day *MUST MAKE APPT FOR FURTHER REFILLS Patient not taking: Reported on 03/05/2017 01/30/17   Pixie Casino, MD    Physical Exam: Vitals:   03/05/17 0833 03/05/17 0837  BP:  140/80  Pulse:  66  Resp:  20  SpO2: 93% 93%      Constitutional: Confused, agitated elderly female lying in bed Vitals:   03/05/17 0833 03/05/17 0837  BP:  140/80  Pulse:  66  Resp:  20  SpO2: 93% 93%   Eyes: PERRL ENMT: Mucous membranes areDry  Neck: normal, supple Respiratory: clear to auscultation bilaterally, no wheezing, no crackles. Normal respiratory effort. No accessory muscle use.  Cardiovascular: Irregularly irregular, S1S2 Normal.  No extremity edema.   Abdomen: Soft, Non-tender, non-distended. Bowel sounds positive.  Musculoskeletal: no clubbing / cyanosis. Right leg mildly rotated. Neurologic: Alert awake and oriented to hospital and her name. Psychiatric: Impaired judgment and insight.    Labs on Admission: I have personally reviewed following labs and imaging studies  CBC:  Recent Labs Lab 03/05/17 0932  WBC 16.9*  NEUTROABS 15.0*  HGB 11.2*  HCT 35.5*  MCV 97.0  PLT 149   Basic Metabolic Panel:  Recent Labs Lab 03/05/17 0932  NA 141  K 4.6  CL 109  CO2 23  GLUCOSE 144*  BUN 29*  CREATININE 1.34*  CALCIUM 9.5   GFR: Estimated Creatinine Clearance: 29.3 mL/min (A) (by C-G formula based on SCr of 1.34 mg/dL (H)). Liver Function Tests: No results for input(s): AST, ALT, ALKPHOS, BILITOT, PROT, ALBUMIN in the last 168  hours. No results for input(s): LIPASE, AMYLASE in the last 168 hours. No results for input(s): AMMONIA in the last 168 hours. Coagulation Profile: No results for input(s): INR, PROTIME in the last 168 hours. Cardiac Enzymes: No results for input(s): CKTOTAL, CKMB, CKMBINDEX, TROPONINI in the last 168 hours. BNP (last 3 results) No results for input(s): PROBNP in the last 8760 hours. HbA1C: No results for input(s): HGBA1C in the last 72 hours. CBG: No results for input(s): GLUCAP in the last 168 hours. Lipid Profile: No results for input(s): CHOL, HDL, LDLCALC, TRIG, CHOLHDL, LDLDIRECT in the last 72 hours. Thyroid Function Tests: No results for input(s): TSH, T4TOTAL, FREET4, T3FREE, THYROIDAB in the last 72 hours. Anemia Panel: No results for input(s): VITAMINB12, FOLATE, FERRITIN, TIBC, IRON, RETICCTPCT in the last 72 hours. Urine analysis:    Component Value Date/Time   COLORURINE YELLOW 10/11/2011 1054   APPEARANCEUR CLEAR 10/11/2011 1054   LABSPEC 1.014 10/11/2011 1054   PHURINE 5.5 10/11/2011 1054   GLUCOSEU NEGATIVE 10/11/2011 1054   HGBUR NEGATIVE 10/11/2011 1054   BILIRUBINUR SMALL 08/21/2016 1629   KETONESUR NEGATIVE 10/11/2011 1054   PROTEINUR 100 08/21/2016 1629   PROTEINUR NEGATIVE 10/11/2011 1054   UROBILINOGEN 0.2 08/21/2016 1629   UROBILINOGEN 0.2 10/11/2011 1054   NITRITE NEG 08/21/2016 1629   NITRITE NEGATIVE 10/11/2011 1054   LEUKOCYTESUR small (1+) (A) 08/21/2016 1629   Sepsis Labs: !!!!!!!!!!!!!!!!!!!!!!!!!!!!!!!!!!!!!!!!!!!! @LABRCNTIP (procalcitonin:4,lacticidven:4) )No results found for this or any previous visit (from the past 240 hour(s)).   Radiological Exams on Admission: Dg Chest 1 View  Result Date: 03/05/2017 CLINICAL DATA:  Examination. No current chest complaints. History of CHF, COPD, hypertension, diabetes. EXAM: CHEST 1 VIEW COMPARISON:  PA and lateral chest x-ray of May 12, 2013 FINDINGS: The lungs are well-expanded. There is no  focal infiltrate. There is no pleural effusion. The cardiac silhouette is enlarged. The pulmonary vascularity is not engorged. There is calcification in the wall of the thoracic aorta. There is multilevel degenerative disc disease of the thoracic spine. There are degenerative changes of the right shoulder. IMPRESSION: There is mild enlarged of the cardiac silhouette without significant pulmonary vascular congestion or pulmonary edema. No acute pneumonia. Thoracic aortic atherosclerosis. Electronically Signed   By: David  Martinique M.D.   On: 03/05/2017 09:18   Dg Hip Unilat With Pelvis 2-3 Views Right  Result Date: 03/05/2017 CLINICAL DATA:  Found on the floor by patient's husband complaining of severe right hip pain with obvious deformity. EXAM: DG HIP (WITH OR WITHOUT PELVIS) 2-3V RIGHT COMPARISON:  No recent studies in [facility] FINDINGS: The patient has sustained an acute subcapital fracture of the right hip. There is mild superior migration of the femoral neck and intertrochanteric region. There appears to be a fracture through the greater trochanter as well. The bony pelvis is intact though subjectively osteopenic. The pubic rami appear intact. There are degenerative changes of the lower lumbar spine. The left hip is grossly normal. IMPRESSION: The patient has sustained an acute subcapital fracture of the right hip. A fracture extends through the greater trochanter as well. Electronically Signed   By: David  Martinique M.D.   On: 03/05/2017 09:17    EKG: A. fib and left bundle branch block.  Assessment/Plan Active Problems:   Hip fracture, unspecified laterality, closed, initial encounter ([place])   Hip fracture ([place])  # Acute subcapital fracture of the right hip: -Evaluated by orthopedics. Hold Eliquis, aspirin today. Plan for surgical intervention. -I meet with patient's husband at bedside in the ER, he is currently not wanting her to go through surgery. Patient is DO NOT RESUSCITATE. I will call palliative  care. -Patient with agitation likely due to pain in the ER. Ordered pain medication. Receiving IM Haldol before placing another IV access and medication. Discussed with the ER staff.  Patient with known left bundle branch block, troponin negative. She had echo in February 2017 with EF of 55-60%. She was seen by cardiologist about a week ago (8/28) recommended continue current medication. Patient denied chest pain or shortness of breath. I think patient is at acceptable risk for moderate risk orthopedic surgery, if she goes to surgery.  #Leukocytosis likely related with his stress, fracture. -Check a UA. -Chest x-ray with no acute pneumonia. Patient is afebrile. Monitor labs.  #Vascular/mixed dementia with behavioral disturbance last agitation: Continue Haldol. Pain management. Supportive care.  #Chronic kidney disease is stage III: Monitor BMP. Creatinine around baseline. Over nephrotoxins.  #Persistent atrial fibrillation: Heart rate controlled. Continue Coreg. Eliquis on hold until final plans regarding the fracture.  #Hypertension: Elevated blood pressure likely contributed by pain and agitation. Continue Coreg, losartan. Monitor blood pressure.  DVT prophylaxis: SCD, patient is on Eliquis which is on hold now.  Code Status: DO NOT RESUSCITATE. I discussed CODE STATUS with the patient's husband at bedside  Family Communication:Patient's husband in ER  Disposition Plan:Telemetry floor  Consults called:Orthopedics  Admission status:Inpatient   Mika Anastasi Tanna Furry MD [facility] Pager [phone]  If 7PM-7AM, please contact night-coverage [email or web address] Password [email or web address]  03/05/2017, 11:32 AM

## 2017-03-05 NOTE — ED Notes (Signed)
Pt confused and removing all cardiac monitoring. Pt removed peripheral iv. Pt redirected. MD notified. See new orders.

## 2017-03-05 NOTE — ED Triage Notes (Signed)
Per EMS, pt is coming from home where she lives with her husband. Pt husband found pt sitting in the living room floor around 0430 this morning. Pt family assisted pt to a wheelchair and she complains of right knee pain. Pt has a hx of dementia.

## 2017-03-05 NOTE — ED Provider Notes (Signed)
[facility] DEPT Provider Note   CSN: [id number] Arrival date & time: 03/05/17  4098     History   Chief Complaint Chief Complaint  Patient presents with  . Fall    HPI [name] is a 83 y.o. female.  HPI   83 year old female presenting after a fall. History is by EMS. Patient reportedly fell around 4:30 this morning. Her husband and her grandson helped her into a wheelchair at that time. She was complaining of persistent pain so EMS called a couple hours later. She is complaining of pain in her right hip and thigh. She denies any acute pain anywhere else. She is not a particularly good historian and is unable to provide much additional history in terms of the circumstances of fall or otherwise. Her medication list includes eliquis presumably for history of atrial fibrillation.  Past Medical History:  Diagnosis Date  . A-fib ([place])   . Anemia    anemia of renal disease  . Arthritis    mutiple joints  . Cancer [facility])    Renal neoplasm- survellience via Urology  . CHF (congestive heart failure) ([place])    Diastolic dys-- EF 11% july 2011  . Chronic kidney disease    Stage III- followed by [facility]  . COPD (chronic obstructive pulmonary disease) ([place])   . Dementia, vascular, mixed, with behavioral disturbance 01/31/2016  . Diabetes mellitus   . DIASTOLIC DYSFUNCTION 03/13/7828   Qualifier: Diagnosis of  By: Buelah Manis MD, Lonell Grandchild    . GERD (gastroesophageal reflux disease)   . H/O hiatal hernia   . Hyperlipidemia   . Hypertension   . Left bundle branch block   . NEOPLASM, KIDNEY 03/04/2007   complex right renal cyst; Per patient- Urologist no longer following- no changes.      . Nonischemic cardiomyopathy ([place])   . Peripheral neuropathy   . Pneumonia   . Shortness of breath    Hx: of with exertion  . Venous insufficiency 02/24/2011    Patient Active Problem List   Diagnosis Date Noted  . Normocytic anemia 02/23/2017  . High risk medication use 02/23/2017  .  Altered mental status 02/23/2017  . Hearing impairment 12/26/2016  . Impaired mobility and ADLs 12/26/2016  . Urinary incontinence in female 12/26/2016  . Cerebellar infarct ([place]) 12/26/2016  . Falls frequently 12/26/2016  . Borderline Low serum vitamin B12 12/26/2016  . Visual impairment in both eyes   . Ovarian cyst 10/28/2016  . DNR (do not resuscitate) 08/22/2016  . Skin lesion of neck 04/18/2016  . Dementia, vascular, mixed, with behavioral disturbance 01/31/2016  . Hematochezia 12/24/2015  . COPD exacerbation ([place]) 09/12/2015  . Constipation 11/27/2014  . Osteoporosis 09/11/2014  . Loss of weight 04/30/2013  . Secondary hyperparathyroidism ([place]) 11/10/2011  . History of anemia 11/10/2011  . [place]-induced atrial fibrillation 02/06/2011  . CKD stage G3b/A2, GFR 30-44 and albumin creatinine ratio 30-299 mg/g 01/26/2007  . Type 2 diabetes mellitus ([place]) 08/27/2006  . HLD (hyperlipidemia) 08/27/2006  . HYPERTENSION, BENIGN SYSTEMIC 08/27/2006  . OSTEOARTHRITIS, MULTI SITES 08/27/2006    Past Surgical History:  Procedure Laterality Date  . ABDOMINAL HYSTERECTOMY    . CARDIAC CATHETERIZATION  08/12/2011   heavily calcified L main; LAD with no stenosis; 1st diagonal free of stenosis; L Cfx with no stenosis; OM1/2 without stenosis; PDA without stenosis; RCA without significant stenosis; non-ischemic cardiomyopathy (Dr. Kathy Breach)  . CATARACT EXTRACTION W/ INTRAOCULAR LENS  IMPLANT, BILATERAL    . CHOLECYSTECTOMY  1987  .  DILATION AND CURETTAGE OF UTERUS    . LEFT HEART CATHETERIZATION WITH CORONARY ANGIOGRAM N/A 08/12/2011   Procedure: LEFT HEART CATHETERIZATION WITH CORONARY ANGIOGRAM;  Surgeon: Pixie Casino, MD;  Location: [facility] CATH LAB;  Service: Cardiovascular;  Laterality: N/A;  . LUMBAR LAMINECTOMY/DECOMPRESSION MICRODISCECTOMY Left 05/12/2013   Procedure: Left Lumbar four-five laminotomy and microdiskectomy;  Surgeon: Hosie Spangle, MD;  Location: [facility] NEURO ORS;   Service: Neurosurgery;  Laterality: Left;  Left Lumbar four-five laminotomy and microdiskectomy  . NM MYOCAR PERF WALL MOTION  05/2011   lexiscan myoview - fixed septal defect r/t LBBB; normal pattern of perfusion in all regions; low risk scan  . TONSILLECTOMY    . TRANSTHORACIC ECHOCARDIOGRAM  09/2011   EF 60-65%; MV mod-severely calcified, mildly thickened MV annulus, mild MR; LA mod dilated; RA mildly dilated    OB History    No data available       Home Medications    Prior to Admission medications   Medication Sig Start Date End Date Taking? Authorizing Provider  albuterol (PROVENTIL HFA;VENTOLIN HFA) 108 (90 Base) MCG/ACT inhaler Inhale 2 puffs into the lungs every 6 (six) hours as needed for wheezing or shortness of breath. 08/08/15   Rosemarie Ax, MD  apixaban (ELIQUIS) 2.5 MG TABS tablet Take 1 tablet (2.5 mg total) by mouth 2 (two) times daily. 02/19/17   Tonette Bihari, MD  aspirin EC 81 MG tablet Take 81 mg by mouth daily.    [provider]  atorvastatin (LIPITOR) 80 MG tablet take 1 tablet by mouth at bedtime 01/26/17   Mikell, Jeani Sow, MD  calcitRIOL (ROCALTROL) 0.25 MCG capsule take 1 capsule by mouth EVERY MONDAY, [facility] AND FRIDAY 03/13/16   Mikell, Jeani Sow, MD  Calcium Carbonate-Vitamin D (CALCIUM 600+D) 600-200 MG-UNIT TABS Take 1 tablet by mouth daily.     [provider]  carvedilol (COREG) 6.25 MG tablet Take 1 tablet (6.25 mg total) by mouth 2 (two) times daily with a meal. Take 1 tablet by mouth two  times daily with meals 03/14/16   Mikell, Jeani Sow, MD  Chromium-Cinnamon (CINNAMON PLUS CHROMIUM PO) Take 1 capsule by mouth 2 (two) times daily. 30mcg/500mg     [provider]  docusate sodium (COLACE) 100 MG capsule Take 2 capsules (200 mg total) by mouth 2 (two) times daily. Reported on 12/20/2015 08/21/16   Tonette Bihari, MD  ELIQUIS 2.5 MG TABS tablet take 1 tablet by mouth twice a day *MUST MAKE APPT FOR  FURTHER REFILLS 01/30/17   Pixie Casino, MD  loratadine (CLARITIN) 10 MG tablet Take 1 tablet (10 mg total) by mouth daily. 10/08/16   Mikell, Jeani Sow, MD  losartan (COZAAR) 50 MG tablet Take 1 tablet (50 mg total) by mouth daily. Call office for appointment, no refills until seen 06/02/16   Pixie Casino, MD  Omega-3 Fatty Acids (FISH OIL) 1000 MG CAPS Take 1 capsule by mouth 2 (two) times daily.     [provider]  Polyvinyl Alcohol-Povidone (REFRESH OP) Place 1 drop into both eyes 3 (three) times daily as needed (dry eyes).    [provider]  QUEtiapine (SEROQUEL) 50 MG tablet Take 1 tablet (50 mg total) by mouth at bedtime. 03/03/17   Mikell, Jeani Sow, MD  ranitidine (ZANTAC) 150 MG tablet take 1 tablet by mouth twice a day 12/15/16   Mikell, Jeani Sow, MD  senna-docusate (SENOKOT-S) 8.6-50 MG tablet Take 1 tablet by mouth 2 (  two) times daily as needed for mild constipation. 08/21/16   Mikell, Jeani Sow, MD  vitamin B-12 (CYANOCOBALAMIN) 100 MCG tablet Take 1 tablet (100 mcg total) by mouth daily. 01/22/17   Mikell, Jeani Sow, MD  vitamin C (ASCORBIC ACID) 500 MG tablet Take 500 mg by mouth 2 (two) times daily.    [provider]    Family History Family History  Problem Relation Age of Onset  . Heart disease Mother   . Arthritis Mother     Social History Social History  Substance Use Topics  . Smoking status: Passive Smoke Exposure - Never Smoker  . Smokeless tobacco: Never Used  . Alcohol use No     Allergies   Patient has no known allergies.   Review of Systems Review of Systems  Level 5 caveat because of dementia.  Physical Exam Updated Vital Signs BP 140/80 (BP Location: Right Arm)   Pulse 66   Resp 20   SpO2 93%   Physical Exam  Constitutional: She appears well-developed and well-nourished. No distress.  HENT:  Head: Normocephalic and atraumatic.  No external signs of head/facial trauma.   Eyes: Conjunctivae are  normal. Right eye exhibits no discharge. Left eye exhibits no discharge.  Neck: Neck supple.  Cardiovascular: Normal rate, regular rhythm and normal heart sounds.  Exam reveals no gallop and no friction rub.   No murmur heard. Pulmonary/Chest: Effort normal and breath sounds normal. No respiratory distress.  Abdominal: Soft. She exhibits no distension. There is no tenderness.  Musculoskeletal: She exhibits no edema or tenderness.  RLE shortened and externally rotated. Can move foot. Warm to touch, palpable DP pulse. Faint bruising to R knee w/o point tenderness and tolerates passive ROM. Severe pain with attempted ROM of R hip. No midline spinal tenderness.   Neurological: She is alert.  Skin: Skin is warm and dry.  Nursing note and vitals reviewed.    ED Treatments / Results  Labs (all labs ordered are listed, but only abnormal results are displayed) Labs Reviewed  BASIC METABOLIC PANEL - Abnormal; Notable for the following:       Result Value   Glucose, Bld 144 (*)    BUN 29 (*)    Creatinine, Ser 1.34 (*)    GFR calc non Af Amer 35 (*)    GFR calc Af Amer 40 (*)    All other components within normal limits  CBC WITH DIFFERENTIAL/PLATELET - Abnormal; Notable for the following:    WBC 16.9 (*)    RBC 3.66 (*)    Hemoglobin 11.2 (*)    HCT 35.5 (*)    Neutro Abs 15.0 (*)    All other components within normal limits  URINALYSIS, ROUTINE W REFLEX MICROSCOPIC    EKG  EKG Interpretation  Date/Time:  Thursday March 05 2017 46:96:29 EDT Ventricular Rate:  98 PR Interval:    QRS Duration: 129 QT Interval:  379 QTC Calculation: 482 R Axis:   -40 Text Interpretation:  Atrial fibrillation Ventricular premature complex Left bundle branch block No significant change since last tracing Confirmed by Virgel Manifold [id number]) on 03/05/2017 10:03:47 AM       Radiology Dg Chest 1 View  Result Date: 03/05/2017 CLINICAL DATA:  Examination. No current chest complaints. History of CHF,  COPD, hypertension, diabetes. EXAM: CHEST 1 VIEW COMPARISON:  PA and lateral chest x-ray of May 12, 2013 FINDINGS: The lungs are well-expanded. There is no focal infiltrate. There is no pleural effusion. The cardiac silhouette  is enlarged. The pulmonary vascularity is not engorged. There is calcification in the wall of the thoracic aorta. There is multilevel degenerative disc disease of the thoracic spine. There are degenerative changes of the right shoulder. IMPRESSION: There is mild enlarged of the cardiac silhouette without significant pulmonary vascular congestion or pulmonary edema. No acute pneumonia. Thoracic aortic atherosclerosis. Electronically Signed   By: David  Martinique M.D.   On: 03/05/2017 09:18   Dg Hip Unilat With Pelvis 2-3 Views Right  Result Date: 03/05/2017 CLINICAL DATA:  Found on the floor by patient's husband complaining of severe right hip pain with obvious deformity. EXAM: DG HIP (WITH OR WITHOUT PELVIS) 2-3V RIGHT COMPARISON:  No recent studies in [facility] FINDINGS: The patient has sustained an acute subcapital fracture of the right hip. There is mild superior migration of the femoral neck and intertrochanteric region. There appears to be a fracture through the greater trochanter as well. The bony pelvis is intact though subjectively osteopenic. The pubic rami appear intact. There are degenerative changes of the lower lumbar spine. The left hip is grossly normal. IMPRESSION: The patient has sustained an acute subcapital fracture of the right hip. A fracture extends through the greater trochanter as well. Electronically Signed   By: David  Martinique M.D.   On: 03/05/2017 09:17    Procedures Procedures (including critical care time)  Medications Ordered in ED Medications  0.9 %  sodium chloride infusion (not administered)  morphine 4 MG/ML injection 4 mg (4 mg Intramuscular Given 03/05/17 0848)  LORazepam (ATIVAN) injection 1 mg (1 mg Intramuscular Given 03/05/17 0848)     Initial  Impression / Assessment and Plan / ED Course  I have reviewed the triage vital signs and the nursing notes.  Pertinent labs & imaging results that were available during my care of the patient were reviewed by me and considered in my medical decision making (see chart for details).     83 year old female with right hip/thigh pain after fall. Clinically she has a hip fracture. The leg is shortened and externally rotated with severe pain with attempted range of motion. She has some faint bruising to her right knee but tolerates passive ROM and no discrete bony tenderness.   8:49 AM Unfortunately, short on ED beds currently. IM pain meds ordered initally. PRN morphine once we get IV access. Needs undressed and more thoroughly assessed but at this time I have a pretty low suspicion for other significant injuries. Imaging. Basic labs, cxr, EKG for pre-op/admission.   9:45 AM Does have subcapital R hip fx. Discussed with Dr. Rolena Infante, orthopedic surgery. No surgery today since on eliquis. Possibly tomorrow or Saturday. Will see in consultation. Medicine admission.   10:44 AM Labs back. Leukocytosis which is probably stress de-margination. PCP is with Family Medicine residency program. I would prefer to admit the patient at [facility] given current the circumstances though. We are currently holding a patient I saw yesterday with a hip fracture who is still in the [facility] ED 24 hours later awaiting an available bed at [facility]. No surgery is planned today because she takes eliquis. This patient would also just sit in the [facility] ER awaiting a bed if she was admitted to [facility].  Will discuss with the hospitalist service for admission to [facility].   10:51 AM Tried calling husband [name] to update him [phone]) w/o answer.   Final Clinical Impressions(s) / ED Diagnoses   Final diagnoses:  Fall  Closed subcapital fracture of right femur, initial encounter ([place])  New Prescriptions New Prescriptions   No medications on file      Virgel Manifold, MD 03/05/17 1056

## 2017-03-06 DIAGNOSIS — N183 Chronic kidney disease, stage 3 (moderate): Secondary | ICD-10-CM

## 2017-03-06 DIAGNOSIS — S72011D Unspecified intracapsular fracture of right femur, subsequent encounter for closed fracture with routine healing: Secondary | ICD-10-CM

## 2017-03-06 DIAGNOSIS — F0281 Dementia in other diseases classified elsewhere with behavioral disturbance: Secondary | ICD-10-CM

## 2017-03-06 DIAGNOSIS — I482 Chronic atrial fibrillation: Secondary | ICD-10-CM

## 2017-03-06 LAB — BASIC METABOLIC PANEL
Anion gap: 8 (ref 5–15)
BUN: 31 mg/dL — AB (ref 6–20)
CHLORIDE: 108 mmol/L (ref 101–111)
CO2: 25 mmol/L (ref 22–32)
CREATININE: 1.43 mg/dL — AB (ref 0.44–1.00)
Calcium: 9.1 mg/dL (ref 8.9–10.3)
GFR calc Af Amer: 37 mL/min — ABNORMAL LOW (ref >60)
GFR, EST NON AFRICAN AMERICAN: 32 mL/min — AB (ref >60)
GLUCOSE: 142 mg/dL — AB (ref 65–99)
POTASSIUM: 5 mmol/L (ref 3.5–5.1)
Sodium: 141 mmol/L (ref 135–145)

## 2017-03-06 LAB — CBC
HCT: 32.8 % — ABNORMAL LOW (ref 36.0–46.0)
Hemoglobin: 10.5 g/dL — ABNORMAL LOW (ref 12.0–15.0)
MCH: 30.5 pg (ref 26.0–34.0)
MCHC: 32 g/dL (ref 30.0–36.0)
MCV: 95.3 fL (ref 78.0–100.0)
PLATELETS: 162 10*3/uL (ref 150–400)
RBC: 3.44 MIL/uL — AB (ref 3.87–5.11)
RDW: 14 % (ref 11.5–15.5)
WBC: 11.5 10*3/uL — ABNORMAL HIGH (ref 4.0–10.5)

## 2017-03-06 MED ORDER — ENSURE ENLIVE PO LIQD
237.0000 mL | Freq: Two times a day (BID) | ORAL | Status: DC
  Administered 2017-03-08 – 2017-03-12 (×7): 237 mL via ORAL

## 2017-03-06 NOTE — Progress Notes (Signed)
    Subjective:    Patient reports pain as 0 on 0-10 scale.  Pt denies pain.  Denies CP or SOB.  Voiding without difficulty into cath. Positive flatus. Nurse reports pt slept well last night.  Today the pt is more lucid than yesterday in ED. She answers questions.   Objective: Vital signs in last 24 hours: Temp:  [97.6 F (36.4 C)-97.9 F (36.6 C)] 97.6 F (36.4 C) (09/07 0510) Pulse Rate:  [66-102] 85 (09/07 0510) Resp:  [16-20] 18 (09/07 0510) BP: (140-181)/(64-94) 181/64 (09/07 0510) SpO2:  [90 %-95 %] 95 % (09/07 0510)  Intake/Output from previous day: 09/06 0701 - 09/07 0700 In: 1002.5 [I.V.:1002.5] Out: 200 [Urine:200] Intake/Output this shift: No intake/output data recorded.  Labs:  Recent Labs  03/05/17 0932 03/06/17 0526  HGB 11.2* 10.5*    Recent Labs  03/05/17 0932 03/06/17 0526  WBC 16.9* 11.5*  RBC 3.66* 3.44*  HCT 35.5* 32.8*  PLT 207 162    Recent Labs  03/05/17 0932 03/06/17 0526  NA 141 141  K 4.6 5.0  CL 109 108  CO2 23 25  BUN 29* 31*  CREATININE 1.34* 1.43*  GLUCOSE 144* 142*  CALCIUM 9.5 9.1   No results for input(s): LABPT, INR in the last 72 hours.  Physical Exam: ABD soft Sensation intact distally Dorsiflexion/Plantar flexion intact Compartment soft  Assessment/Plan:    The pt will have surgery with Dr. Alvan Dame on Sunday NPO after midnight Saturday Continue care on floor NWB  [place], Darla Lesches for Dr. Melina Schools [facility] [phone] 03/06/2017, 7:08 AM

## 2017-03-06 NOTE — Progress Notes (Signed)
PROGRESS NOTE                                                                                                                                                                                                             Patient Demographics:    [name], is a 83 y.o. female, DOB - 01/02/1928, SJG:[id number]  Admit date - 03/05/2017   Admitting Physician Dron Tanna Furry, MD  Outpatient Primary MD for the patient is Tonette Bihari, MD  LOS - 1  Outpatient Specialists:none  Chief Complaint  Patient presents with  . Fall       Brief Narrative  83 year old female with chronic A. fib on anticoagulation, diastolic CHF, COPD, severe dementia with behavioral disturbance, LBBB, hypertension, hyperlipidemia, renal neoplasm brought to the ED from home after sustaining a mechanical fall and landed on her hip. She was found to be agitated in the ED and unable to provide any history. In the ED x-ray was consistent with right hip fracture. Orthopedics consulted who recommended to hold anticoagulation and plan for surgery on 9/9.    Subjective:   Patient very confused and agitated following admission requiring intermittent Haldol. She is calm this morning and quite confused.   Assessment  & Plan :    Active Problems:   Hip fracture, Right, closed, subsequent encounter ([place]) Pain control with when necessary Vicodin and low-dose IV morphine. Added bowel regimen. Seen by orthopedics and recommend surgery on 9/9. Husband was initially hesitant for surgery but now agrees with it. As per him patient is able to ambulate independently at baseline.  Preoperative cardiac clearance. As per gupta perioperative cardiac risk score, patients estimated perioperative including 30 day postoperative risk for myocardial infarction and or cardiac arrest is 1.75%. Patient is a euvolemic on exam. EKG shows A. fib with old LBBB (unchanged from  prior). 2-D echo from February 2018 with normal EF of 55-60% with no wall motion abnormality. Patient is on beta blocker at home which is continued perioperatively.  She does not need further cardiac or respiratory preoperative evaluation. She was also seen by her cardiologist about 1 week ago and recommended to continue her current medications.  Chronic/persistent atrial fibrillation Eliquis held for surgery. Continue beta blocker. Rate controlled on monitor.  Chronic kidney disease stage III Creatinine around baseline. Avoid nephrotoxins. Monitor.  Leukocytosis Likely  stress related. No signs of infection.  Vascular/mixed dementia with agitation Continue when necessary Haldol and Seroquel.  Essential hypertension Elevated blood pressure due to pain and agitation. Continue home medications.   Code Status : DO NOT RESUSCITATE  Family Communication  : Husband at bedside  Disposition Plan  : Pending postop course  Barriers For Discharge : Pending surgery  Consults  :  [facility]  Procedures  : None  DVT Prophylaxis  :  Lovenox -   Lab Results  Component Value Date   PLT 162 03/06/2017    Antibiotics  :   Anti-infectives    None        Objective:   Vitals:   03/05/17 2135 03/06/17 0510 03/06/17 0936 03/06/17 1327  BP: (!) 169/94 (!) 181/64 (!) 166/60 (!) 146/67  Pulse: 93 85 90 93  Resp: 18 18 18 18   Temp: 97.9 F (36.6 C) 97.6 F (36.4 C) 98.4 F (36.9 C) 98.7 F (37.1 C)  TempSrc: Oral Oral Oral Oral  SpO2: 95% 95% 94% 95%    Wt Readings from Last 3 Encounters:  03/03/17 65.8 kg (145 lb)  02/24/17 65.2 kg (143 lb 12.8 oz)  02/10/17 66 kg (145 lb 9.6 oz)     Intake/Output Summary (Last 24 hours) at 03/06/17 1442 Last data filed at 03/06/17 1328  Gross per 24 hour  Intake           1722.5 ml  Output              200 ml  Net           1522.5 ml     Physical Exam  Gen: not in distress, confused HEENT:  moist mucosa, supple  neck Chest: clear b/l, no added sounds CVS: S1 and S2 irregular, no murmurs rub or gallop GI: soft, NT, ND,  Musculoskeletal: warm, no edema, limited mobility of right hip CNS: AAOX1    Data Review:    CBC  Recent Labs Lab 03/05/17 0932 03/06/17 0526  WBC 16.9* 11.5*  HGB 11.2* 10.5*  HCT 35.5* 32.8*  PLT 207 162  MCV 97.0 95.3  MCH 30.6 30.5  MCHC 31.5 32.0  RDW 13.9 14.0  LYMPHSABS 1.1  --   MONOABS 0.7  --   EOSABS 0.1  --   BASOSABS 0.0  --     Chemistries   Recent Labs Lab 03/05/17 0932 03/06/17 0526  NA 141 141  K 4.6 5.0  CL 109 108  CO2 23 25  GLUCOSE 144* 142*  BUN 29* 31*  CREATININE 1.34* 1.43*  CALCIUM 9.5 9.1   ------------------------------------------------------------------------------------------------------------------ No results for input(s): CHOL, HDL, LDLCALC, TRIG, CHOLHDL, LDLDIRECT in the last 72 hours.  Lab Results  Component Value Date   HGBA1C 6.2 01/01/2017   ------------------------------------------------------------------------------------------------------------------ No results for input(s): TSH, T4TOTAL, T3FREE, THYROIDAB in the last 72 hours.  Invalid input(s): FREET3 ------------------------------------------------------------------------------------------------------------------ No results for input(s): VITAMINB12, FOLATE, FERRITIN, TIBC, IRON, RETICCTPCT in the last 72 hours.  Coagulation profile No results for input(s): INR, PROTIME in the last 168 hours.  No results for input(s): DDIMER in the last 72 hours.  Cardiac Enzymes  Recent Labs Lab 03/05/17 0932  TROPONINI <0.03   ------------------------------------------------------------------------------------------------------------------    Component Value Date/Time   BNP 199.9 (H) 07/26/2015 1520    Inpatient Medications  Scheduled Meds: . atorvastatin  80 mg Oral QHS  . calcitRIOL  0.25 mcg Oral Q M,W,F  . carvedilol  6.25 mg Oral BID  WC  .  docusate sodium  200 mg Oral BID  . famotidine  10 mg Oral BID  . feeding supplement (ENSURE ENLIVE)  237 mL Oral BID BM  . loratadine  10 mg Oral Daily  . losartan  50 mg Oral Daily  . QUEtiapine  50 mg Oral QHS  . vitamin B-12  100 mcg Oral Daily   Continuous Infusions: . 0.9 % NaCl with KCl 20 mEq / L 75 mL/hr at 03/06/17 0435   PRN Meds:.haloperidol, haloperidol lactate, HYDROcodone-acetaminophen, ipratropium-albuterol, morphine injection, senna-docusate  Micro Results No results found for this or any previous visit (from the past 240 hour(s)).  Radiology Reports Dg Chest 1 View  Result Date: 03/05/2017 CLINICAL DATA:  Examination. No current chest complaints. History of CHF, COPD, hypertension, diabetes. EXAM: CHEST 1 VIEW COMPARISON:  PA and lateral chest x-ray of May 12, 2013 FINDINGS: The lungs are well-expanded. There is no focal infiltrate. There is no pleural effusion. The cardiac silhouette is enlarged. The pulmonary vascularity is not engorged. There is calcification in the wall of the thoracic aorta. There is multilevel degenerative disc disease of the thoracic spine. There are degenerative changes of the right shoulder. IMPRESSION: There is mild enlarged of the cardiac silhouette without significant pulmonary vascular congestion or pulmonary edema. No acute pneumonia. Thoracic aortic atherosclerosis. Electronically Signed   By: David  Martinique M.D.   On: 03/05/2017 09:18   Dg Hip Unilat With Pelvis 2-3 Views Right  Result Date: 03/05/2017 CLINICAL DATA:  Found on the floor by patient's husband complaining of severe right hip pain with obvious deformity. EXAM: DG HIP (WITH OR WITHOUT PELVIS) 2-3V RIGHT COMPARISON:  No recent studies in [facility] FINDINGS: The patient has sustained an acute subcapital fracture of the right hip. There is mild superior migration of the femoral neck and intertrochanteric region. There appears to be a fracture through the greater trochanter as well. The  bony pelvis is intact though subjectively osteopenic. The pubic rami appear intact. There are degenerative changes of the lower lumbar spine. The left hip is grossly normal. IMPRESSION: The patient has sustained an acute subcapital fracture of the right hip. A fracture extends through the greater trochanter as well. Electronically Signed   By: David  Martinique M.D.   On: 03/05/2017 09:17    Time Spent in minutes  25   Louellen Molder M.D on 03/06/2017 at 2:42 PM  Between 7am to 7pm - Pager - [phone]  After 7pm go to [email or web address] - password [facility]  [facility] -  Office  [phone]

## 2017-03-06 NOTE — Discharge Instructions (Signed)

## 2017-03-06 NOTE — Care Management Note (Signed)
Case Management Note  Patient Details  Name: DIERA WIRKKALA MRN: [id number] Date of Birth: 01-08-1928  Subjective/Objective:                  Rt hip fracture  Action/Plan: Date:  March 06, 2017 Chart reviewed for concurrent status and case management needs. Will continue to follow patient progress. Discharge Planning: following for needs Expected discharge date: [id number] Velva Harman, BSN, [place], [place]  Expected Discharge Date:   (unknown)               Expected Discharge Plan:  Home/Self Care  In-House Referral:  Clinical Social Work  Discharge planning Services  CM Consult  Post Acute Care Choice:    Choice offered to:     DME Arranged:    DME Agency:     HH Arranged:    [place] Agency:     Status of Service:  In process, will continue to follow  If discussed at Long Length of Stay Meetings, dates discussed:    Additional Comments:  Leeroy Cha, RN 03/06/2017, 9:05 AM

## 2017-03-06 NOTE — Progress Notes (Signed)
Initial Nutrition Assessment  DOCUMENTATION CODES:   Not applicable  INTERVENTION:   Ensure Enlive po BID, each supplement provides 350 kcal and 20 grams of protein  Include protein supplement information in discharge instructions.   NUTRITION DIAGNOSIS:   Increased nutrient needs related to wound healing as evidenced by estimated needs.  GOAL:   Patient will meet greater than or equal to 90% of their needs  MONITOR:   PO intake, Supplement acceptance, Labs, Weight trends  REASON FOR ASSESSMENT:   Consult Assessment of nutrition requirement/status  ASSESSMENT:   Pt with PMH of renal neoplasm, diastolic CHF, DM, COPD, dementia with behavioral disturbance, HTN, and HLD. Presents this admission with right hip fracture. Surgical intervention planned for 9/9.   Pt asleep upon assessment. Spoke with husband at bedside. Pt's eating habits reported to be off and on. Husband states, pt will not eat if she is upset, which is unpredictable with dementia. When in a better mood, pt consumes 3 meals per day with snacks. Appetite has been decreased for the last couple of days. Discussed the importance of protein intake for preservation of lean body mass and wound healing post hip repair. Included protein supplement information in discharge instructions.   Records indicate pt has maintained a wt of 145-150 lb for a year. Husband reports no unintentional wt loss. Nutrition-Focused physical exam completed. Findings are no fat depletion, moderate muscle depletion in upper body, and mild edema in BLE. Pt noted to have mild to moderate depletions, suspect these are related to advanced age rather than actual wt loss. RD to provide supplementation for increased nutrient needs and continue to encourage PO intake.   Medications reviewed and include: calcitriol, Vit B12, NaCl with KCl @ 75 ml/hr Labs reviewed: BUN 31 (H) Creatinine 1.43 (H) CBG 142-144  Diet Order:  DIET SOFT Room service appropriate?  Yes; Fluid consistency: Thin  Skin:  Reviewed, no issues  Last BM:  03/05/17  Height:   Ht Readings from Last 1 Encounters:  02/24/17 5\' 7"  (1.702 m)    Weight:   Wt Readings from Last 1 Encounters:  03/03/17 145 lb (65.8 kg)    Ideal Body Weight:  61.4 kg  BMI:  There is no height or weight on file to calculate BMI.  Estimated Nutritional Needs:   Kcal:  1700-1900 (26-29 kcal/kg)  Protein:  85-95 grams (1.3-1.5 g/kg)  Fluid:  >1.7 L/day  EDUCATION NEEDS:   Education needs addressed  [place], LDN Clinical Nutrition Pager # [phone]

## 2017-03-07 DIAGNOSIS — R451 Restlessness and agitation: Secondary | ICD-10-CM

## 2017-03-07 DIAGNOSIS — S72001D Fracture of unspecified part of neck of right femur, subsequent encounter for closed fracture with routine healing: Secondary | ICD-10-CM

## 2017-03-07 LAB — BASIC METABOLIC PANEL
ANION GAP: 9 (ref 5–15)
BUN: 32 mg/dL — AB (ref 6–20)
CALCIUM: 9.4 mg/dL (ref 8.9–10.3)
CO2: 23 mmol/L (ref 22–32)
Chloride: 107 mmol/L (ref 101–111)
Creatinine, Ser: 1.42 mg/dL — ABNORMAL HIGH (ref 0.44–1.00)
GFR calc Af Amer: 38 mL/min — ABNORMAL LOW (ref >60)
GFR, EST NON AFRICAN AMERICAN: 32 mL/min — AB (ref >60)
Glucose, Bld: 153 mg/dL — ABNORMAL HIGH (ref 65–99)
Potassium: 4.5 mmol/L (ref 3.5–5.1)
SODIUM: 139 mmol/L (ref 135–145)

## 2017-03-07 MED ORDER — DILTIAZEM HCL 25 MG/5ML IV SOLN
10.0000 mg | Freq: Four times a day (QID) | INTRAVENOUS | Status: DC | PRN
  Filled 2017-03-07: qty 5

## 2017-03-07 MED ORDER — CARVEDILOL 12.5 MG PO TABS
12.5000 mg | ORAL_TABLET | Freq: Two times a day (BID) | ORAL | Status: DC
  Administered 2017-03-07 – 2017-03-12 (×7): 12.5 mg via ORAL
  Filled 2017-03-07 (×8): qty 1

## 2017-03-07 NOTE — Progress Notes (Signed)
Patient followed by [facility], please notify them when DC orders written.

## 2017-03-07 NOTE — Progress Notes (Signed)
PROGRESS NOTE                                                                                                                                                                                                             Patient Demographics:    [name], is a 83 y.o. female, DOB - 01/22/1928, ZHG:[id number]  Admit date - 03/05/2017   Admitting Physician Dron Tanna Furry, MD  Outpatient Primary MD for the patient is Tonette Bihari, MD  LOS - 2  Outpatient Specialists:none  Chief Complaint  Patient presents with  . Fall       Brief Narrative  83 year old female with chronic A. fib on anticoagulation, diastolic CHF, COPD, severe dementia with behavioral disturbance, LBBB, hypertension, hyperlipidemia, renal neoplasm brought to the ED from home after sustaining a mechanical fall and landed on her hip. She was found to be agitated in the ED and unable to provide any history. In the ED x-ray was consistent with right hip fracture. Orthopedics consulted who recommended to hold anticoagulation and plan for surgery on 9/9.    Subjective:   Patient getting tachycardic to 150s when she is agitated. Sleepy during my evaluation this morning.  Assessment  & Plan :    Active Problems:   Hip fracture, Right, closed, subsequent encounter ([place]) Pain control with when necessary Vicodin and low-dose IV morphine. Won't escalate narcotics further. Added bowel regimen.Orthopedic plan on surgery tomorrow morning. Nothing by mouth after midnight.    Preoperative cardiac clearance. As per gupta perioperative cardiac risk score, patients estimated perioperative including 30 day postoperative risk for myocardial infarction and or cardiac arrest is 1.75%. Patient is a euvolemic on exam. EKG shows A. fib with old LBBB (unchanged from prior). 2-D echo from February 2018 with normal EF of 55-60% with no wall motion abnormality. Patient  is on beta blocker at home which is continued perioperatively.  She does not need further cardiac or respiratory preoperative evaluation. She was also seen by her cardiologist about 1 week ago and recommended to continue her current medications.  Chronic/persistent atrial fibrillation Eliquis held for surgery. Continue beta blocker.Gets tachycardic on minimal exertion and with agitation. I will increase her Coreg dose to 12.5 mg twice a day and add when necessary IV Cardizem.  Chronic kidney disease stage III Creatinine around baseline. Avoid nephrotoxins. Monitor.  Leukocytosis Likely stress related. No signs of infection.  Vascular/mixed dementia with agitation Continue when necessary Haldol and Seroquel.Safety sitter as needed.  Essential hypertension Elevated blood pressure due to pain and agitation.Increase carvedilol dose.    Code Status : DO NOT RESUSCITATE  Family Communication  :None  at bedside  Disposition Plan  : Pending postop course  Barriers For Discharge : Pending surgery  Consults  :  [place] [facility]  Procedures  : None  DVT Prophylaxis  :  Lovenox -   Lab Results  Component Value Date   PLT 162 03/06/2017    Antibiotics  :   Anti-infectives    None        Objective:   Vitals:   03/06/17 0936 03/06/17 1327 03/06/17 1729 03/07/17 0526  BP: (!) 166/60 (!) 146/67 113/62 (!) 147/64  Pulse: 90 93 78 88  Resp: 18 18 18 18   Temp: 98.4 F (36.9 C) 98.7 F (37.1 C) 98.3 F (36.8 C) 98.6 F (37 C)  TempSrc: Oral Oral Oral Oral  SpO2: 94% 95% 93% 94%    Wt Readings from Last 3 Encounters:  03/03/17 65.8 kg (145 lb)  02/24/17 65.2 kg (143 lb 12.8 oz)  02/10/17 66 kg (145 lb 9.6 oz)     Intake/Output Summary (Last 24 hours) at 03/07/17 1311 Last data filed at 03/07/17 0945  Gross per 24 hour  Intake              480 ml  Output                0 ml  Net              480 ml     Physical Exam General: Somnolent  HEENT: Moist  mucosa, supple neck Chest: Clear bilaterally, no added sounds CVS: S1 and S2 irregular, no murmurs GI: Soft, nondistended, nontender Musculoskeletal: Warm, no edema, limited mobility of right hip      Data Review:    CBC  Recent Labs Lab 03/05/17 0932 03/06/17 0526  WBC 16.9* 11.5*  HGB 11.2* 10.5*  HCT 35.5* 32.8*  PLT 207 162  MCV 97.0 95.3  MCH 30.6 30.5  MCHC 31.5 32.0  RDW 13.9 14.0  LYMPHSABS 1.1  --   MONOABS 0.7  --   EOSABS 0.1  --   BASOSABS 0.0  --     Chemistries   Recent Labs Lab 03/05/17 0932 03/06/17 0526 03/07/17 0648  NA 141 141 139  K 4.6 5.0 4.5  CL 109 108 107  CO2 23 25 23   GLUCOSE 144* 142* 153*  BUN 29* 31* 32*  CREATININE 1.34* 1.43* 1.42*  CALCIUM 9.5 9.1 9.4   ------------------------------------------------------------------------------------------------------------------ No results for input(s): CHOL, HDL, LDLCALC, TRIG, CHOLHDL, LDLDIRECT in the last 72 hours.  Lab Results  Component Value Date   HGBA1C 6.2 01/01/2017   ------------------------------------------------------------------------------------------------------------------ No results for input(s): TSH, T4TOTAL, T3FREE, THYROIDAB in the last 72 hours.  Invalid input(s): FREET3 ------------------------------------------------------------------------------------------------------------------ No results for input(s): VITAMINB12, FOLATE, FERRITIN, TIBC, IRON, RETICCTPCT in the last 72 hours.  Coagulation profile No results for input(s): INR, PROTIME in the last 168 hours.  No results for input(s): DDIMER in the last 72 hours.  Cardiac Enzymes  Recent Labs Lab 03/05/17 0932  TROPONINI <0.03   ------------------------------------------------------------------------------------------------------------------    Component Value Date/Time   BNP 199.9 (H) 07/26/2015 1520    Inpatient Medications  Scheduled Meds: . atorvastatin  80 mg Oral QHS  . calcitRIOL  0.25 mcg Oral Q M,W,F  . carvedilol  6.25 mg Oral BID WC  . docusate sodium  200 mg Oral BID  . famotidine  10 mg Oral BID  . feeding supplement (ENSURE ENLIVE)  237 mL Oral BID BM  . loratadine  10 mg Oral Daily  . losartan  50 mg Oral Daily  . QUEtiapine  50 mg Oral QHS  . vitamin B-12  100 mcg Oral Daily   Continuous Infusions:  PRN Meds:.haloperidol, haloperidol lactate, HYDROcodone-acetaminophen, ipratropium-albuterol, morphine injection, senna-docusate  Micro Results No results found for this or any previous visit (from the past 240 hour(s)).  Radiology Reports Dg Chest 1 View  Result Date: 03/05/2017 CLINICAL DATA:  Examination. No current chest complaints. History of CHF, COPD, hypertension, diabetes. EXAM: CHEST 1 VIEW COMPARISON:  PA and lateral chest x-ray of May 12, 2013 FINDINGS: The lungs are well-expanded. There is no focal infiltrate. There is no pleural effusion. The cardiac silhouette is enlarged. The pulmonary vascularity is not engorged. There is calcification in the wall of the thoracic aorta. There is multilevel degenerative disc disease of the thoracic spine. There are degenerative changes of the right shoulder. IMPRESSION: There is mild enlarged of the cardiac silhouette without significant pulmonary vascular congestion or pulmonary edema. No acute pneumonia. Thoracic aortic atherosclerosis. Electronically Signed   By: David  Martinique M.D.   On: 03/05/2017 09:18   Dg Hip Unilat With Pelvis 2-3 Views Right  Result Date: 03/05/2017 CLINICAL DATA:  Found on the floor by patient's husband complaining of severe right hip pain with obvious deformity. EXAM: DG HIP (WITH OR WITHOUT PELVIS) 2-3V RIGHT COMPARISON:  No recent studies in [facility] FINDINGS: The patient has sustained an acute subcapital fracture of the right hip. There is mild superior migration of the femoral neck and intertrochanteric region. There appears to be a fracture through the greater trochanter as well. The  bony pelvis is intact though subjectively osteopenic. The pubic rami appear intact. There are degenerative changes of the lower lumbar spine. The left hip is grossly normal. IMPRESSION: The patient has sustained an acute subcapital fracture of the right hip. A fracture extends through the greater trochanter as well. Electronically Signed   By: David  Martinique M.D.   On: 03/05/2017 09:17    Time Spent in minutes  25   Louellen Molder M.D on 03/07/2017 at 1:11 PM  Between 7am to 7pm - Pager - [phone]  After 7pm go to [email or web address] - password [facility]  [facility] -  Office  [phone]

## 2017-03-07 NOTE — Progress Notes (Addendum)
Subjective:     Patient with PMH significant for dementia.  She is agitated this morning and unable to answer questions appropriately or follow commands.  Nurse reports concerns over persistent elevated HR through the night and pain.  Objective:   VITALS:  Temp:  [98.3 F (36.8 C)-98.7 F (37.1 C)] 98.6 F (37 C) (09/08 0526) Pulse Rate:  [78-93] 88 (09/08 0526) Resp:  [18] 18 (09/08 0526) BP: (113-166)/(60-67) 147/64 (09/08 0526) SpO2:  [93 %-95 %] 94 % (09/08 0526)  General: WDWN patient in NAD. Psych:  Patient is agitated this morning. Neuro:  A&O x 1, Moving all extremities, sensation intact to light touch HEENT:  EOMs intact Chest:  Even non-labored respirations Skin:  Skin C/D/I, no rashes or lesions Extremities: warm/dry, mild edema, no erythema, or echymosis.  No lymphadenopathy. Pulses: Popliteus 2+ MSK:  ROM: full ankle ROM, MMT: patient is able to perform quad set, (-) Homan's    LABS  Recent Labs  03/05/17 0932 03/06/17 0526  HGB 11.2* 10.5*  WBC 16.9* 11.5*  PLT 207 162    Recent Labs  03/05/17 0932 03/06/17 0526  NA 141 141  K 4.6 5.0  CL 109 108  CO2 23 25  BUN 29* 31*  CREATININE 1.34* 1.43*  GLUCOSE 144* 142*   No results for input(s): LABPT, INR in the last 72 hours.   Assessment/Plan:    R Hip subcapital and greater trochanteric fxs  Dr. Alvan Dame is planning on taking to OR tomorrow NWB R LE NPO after midnight Hold blood thinners Patient is currently on haldol,morphine, and Norco.  With her age and [place] I would prefer to not to add any more narcotics.  I will defer to medicine team if pain control and elevated HR continue to be a concern.   Mechele Claude, PA-C [facility] Office:  [phone]

## 2017-03-08 ENCOUNTER — Inpatient Hospital Stay (HOSPITAL_COMMUNITY): Payer: Medicare Other | Admitting: Anesthesiology

## 2017-03-08 ENCOUNTER — Encounter (HOSPITAL_COMMUNITY)
Admission: EM | Disposition: A | Discharge status: Skilled Nursing Facility | Payer: Self-pay | Source: Home / Self Care | Attending: Internal Medicine

## 2017-03-08 ENCOUNTER — Inpatient Hospital Stay (HOSPITAL_COMMUNITY): Payer: Medicare Other

## 2017-03-08 DIAGNOSIS — R000 Tachycardia, unspecified: Secondary | ICD-10-CM

## 2017-03-08 DIAGNOSIS — E1122 Type 2 diabetes mellitus with diabetic chronic kidney disease: Secondary | ICD-10-CM | POA: Diagnosis not present

## 2017-03-08 DIAGNOSIS — R34 Anuria and oliguria: Secondary | ICD-10-CM

## 2017-03-08 DIAGNOSIS — N182 Chronic kidney disease, stage 2 (mild): Secondary | ICD-10-CM | POA: Diagnosis not present

## 2017-03-08 DIAGNOSIS — S72001A Fracture of unspecified part of neck of right femur, initial encounter for closed fracture: Secondary | ICD-10-CM | POA: Diagnosis not present

## 2017-03-08 DIAGNOSIS — I129 Hypertensive chronic kidney disease with stage 1 through stage 4 chronic kidney disease, or unspecified chronic kidney disease: Secondary | ICD-10-CM | POA: Diagnosis not present

## 2017-03-08 HISTORY — PX: HIP ARTHROPLASTY: SHX981

## 2017-03-08 LAB — PREPARE RBC (CROSSMATCH)

## 2017-03-08 LAB — ABO/RH: ABO/RH(D): O NEG

## 2017-03-08 SURGERY — HEMIARTHROPLASTY, HIP, DIRECT ANTERIOR APPROACH, FOR FRACTURE
Anesthesia: General | Site: Hip | Laterality: Right

## 2017-03-08 MED ORDER — ONDANSETRON HCL 4 MG/2ML IJ SOLN
INTRAMUSCULAR | Status: AC
  Filled 2017-03-08: qty 2

## 2017-03-08 MED ORDER — FENTANYL CITRATE (PF) 100 MCG/2ML IJ SOLN
INTRAMUSCULAR | Status: DC | PRN
  Administered 2017-03-08 (×3): 50 ug via INTRAVENOUS

## 2017-03-08 MED ORDER — ACETAMINOPHEN 650 MG RE SUPP: 650.0000 mg | Freq: Four times a day (QID) | RECTAL | Status: DC | PRN

## 2017-03-08 MED ORDER — DEXAMETHASONE SODIUM PHOSPHATE 10 MG/ML IJ SOLN
INTRAMUSCULAR | Status: DC | PRN
  Administered 2017-03-08: 5 mg via INTRAVENOUS

## 2017-03-08 MED ORDER — 0.9 % SODIUM CHLORIDE (POUR BTL) OPTIME
TOPICAL | Status: DC | PRN
  Administered 2017-03-08: 1000 mL

## 2017-03-08 MED ORDER — HYDROCODONE-ACETAMINOPHEN 5-325 MG PO TABS: 1.0000 | ORAL_TABLET | Freq: Four times a day (QID) | ORAL | 0 refills | Status: DC | PRN

## 2017-03-08 MED ORDER — ONDANSETRON HCL 4 MG/2ML IJ SOLN
INTRAMUSCULAR | Status: DC | PRN
  Administered 2017-03-08: 4 mg via INTRAVENOUS

## 2017-03-08 MED ORDER — ONDANSETRON HCL 4 MG/2ML IJ SOLN: 4.0000 mg | Freq: Four times a day (QID) | INTRAMUSCULAR | Status: DC | PRN

## 2017-03-08 MED ORDER — FENTANYL CITRATE (PF) 100 MCG/2ML IJ SOLN
25.0000 ug | INTRAMUSCULAR | Status: DC | PRN
  Administered 2017-03-08: 25 ug via INTRAVENOUS

## 2017-03-08 MED ORDER — SODIUM CHLORIDE 0.9 % IV SOLN
INTRAVENOUS | Status: DC
  Administered 2017-03-09 – 2017-03-11 (×5): via INTRAVENOUS

## 2017-03-08 MED ORDER — APIXABAN 2.5 MG PO TABS
2.5000 mg | ORAL_TABLET | Freq: Two times a day (BID) | ORAL | Status: DC
  Administered 2017-03-09 – 2017-03-12 (×7): 2.5 mg via ORAL
  Filled 2017-03-08 (×7): qty 1

## 2017-03-08 MED ORDER — LIDOCAINE 2% (20 MG/ML) 5 ML SYRINGE
INTRAMUSCULAR | Status: DC | PRN
  Administered 2017-03-08: 80 mg via INTRAVENOUS

## 2017-03-08 MED ORDER — PROPOFOL 10 MG/ML IV BOLUS
INTRAVENOUS | Status: AC
  Filled 2017-03-08: qty 20

## 2017-03-08 MED ORDER — PHENYLEPHRINE HCL 10 MG/ML IJ SOLN
INTRAVENOUS | Status: DC | PRN
  Administered 2017-03-08: 50 ug/min via INTRAVENOUS

## 2017-03-08 MED ORDER — CEFAZOLIN SODIUM-DEXTROSE 2-4 GM/100ML-% IV SOLN
INTRAVENOUS | Status: AC
  Filled 2017-03-08: qty 100

## 2017-03-08 MED ORDER — POLYETHYLENE GLYCOL 3350 17 G PO PACK: 17.0000 g | PACK | Freq: Every day | ORAL | Status: DC | PRN

## 2017-03-08 MED ORDER — LIDOCAINE 2% (20 MG/ML) 5 ML SYRINGE
INTRAMUSCULAR | Status: AC
  Filled 2017-03-08: qty 5

## 2017-03-08 MED ORDER — ONDANSETRON HCL 4 MG PO TABS: 4.0000 mg | ORAL_TABLET | Freq: Four times a day (QID) | ORAL | Status: DC | PRN

## 2017-03-08 MED ORDER — MENTHOL 3 MG MT LOZG
1.0000 | LOZENGE | OROMUCOSAL | Status: DC | PRN
  Filled 2017-03-08: qty 9

## 2017-03-08 MED ORDER — PHENYLEPHRINE HCL 10 MG/ML IJ SOLN
INTRAMUSCULAR | Status: DC | PRN
  Administered 2017-03-08 (×4): 120 ug via INTRAVENOUS

## 2017-03-08 MED ORDER — LACTATED RINGERS IV SOLN
INTRAVENOUS | Status: DC | PRN
  Administered 2017-03-08: 12:00:00 via INTRAVENOUS

## 2017-03-08 MED ORDER — CEFAZOLIN SODIUM-DEXTROSE 1-4 GM/50ML-% IV SOLN
1.0000 g | Freq: Four times a day (QID) | INTRAVENOUS | Status: AC
  Administered 2017-03-08 (×2): 1 g via INTRAVENOUS
  Filled 2017-03-08 (×3): qty 50

## 2017-03-08 MED ORDER — PROMETHAZINE HCL 25 MG/ML IJ SOLN: 6.2500 mg | INTRAMUSCULAR | Status: DC | PRN

## 2017-03-08 MED ORDER — PROPOFOL 10 MG/ML IV BOLUS
INTRAVENOUS | Status: DC | PRN
  Administered 2017-03-08: 90 mg via INTRAVENOUS

## 2017-03-08 MED ORDER — ACETAMINOPHEN 325 MG PO TABS
650.0000 mg | ORAL_TABLET | Freq: Four times a day (QID) | ORAL | Status: DC | PRN
  Filled 2017-03-08: qty 2

## 2017-03-08 MED ORDER — CEFAZOLIN SODIUM-DEXTROSE 2-3 GM-% IV SOLR
INTRAVENOUS | Status: DC | PRN
  Administered 2017-03-08: 2 g via INTRAVENOUS

## 2017-03-08 MED ORDER — METOCLOPRAMIDE HCL 5 MG PO TABS: 5.0000 mg | ORAL_TABLET | Freq: Three times a day (TID) | ORAL | Status: DC | PRN

## 2017-03-08 MED ORDER — FENTANYL CITRATE (PF) 100 MCG/2ML IJ SOLN
INTRAMUSCULAR | Status: AC
Start: 2017-03-08 — End: 2017-03-09
  Filled 2017-03-08: qty 2

## 2017-03-08 MED ORDER — FENTANYL CITRATE (PF) 250 MCG/5ML IJ SOLN
INTRAMUSCULAR | Status: AC
  Filled 2017-03-08: qty 5

## 2017-03-08 MED ORDER — FERROUS SULFATE 325 (65 FE) MG PO TABS
325.0000 mg | ORAL_TABLET | Freq: Two times a day (BID) | ORAL | Status: DC
  Administered 2017-03-09 – 2017-03-12 (×4): 325 mg via ORAL
  Filled 2017-03-08 (×6): qty 1

## 2017-03-08 MED ORDER — ALUM & MAG HYDROXIDE-SIMETH 200-200-20 MG/5ML PO SUSP: 30.0000 mL | ORAL | Status: DC | PRN

## 2017-03-08 MED ORDER — SUGAMMADEX SODIUM 200 MG/2ML IV SOLN
INTRAVENOUS | Status: DC | PRN
  Administered 2017-03-08: 150 mg via INTRAVENOUS

## 2017-03-08 MED ORDER — PHENOL 1.4 % MT LIQD
1.0000 | OROMUCOSAL | Status: DC | PRN
  Filled 2017-03-08: qty 177

## 2017-03-08 MED ORDER — METOCLOPRAMIDE HCL 5 MG/ML IJ SOLN: 5.0000 mg | Freq: Three times a day (TID) | INTRAMUSCULAR | Status: DC | PRN

## 2017-03-08 MED ORDER — DEXAMETHASONE SODIUM PHOSPHATE 10 MG/ML IJ SOLN
INTRAMUSCULAR | Status: AC
  Filled 2017-03-08: qty 1

## 2017-03-08 MED ORDER — ASPIRIN EC 81 MG PO TBEC
81.0000 mg | DELAYED_RELEASE_TABLET | Freq: Every day | ORAL | Status: DC
  Administered 2017-03-09 – 2017-03-12 (×4): 81 mg via ORAL
  Filled 2017-03-08 (×4): qty 1

## 2017-03-08 MED ORDER — HYDROCODONE-ACETAMINOPHEN 5-325 MG PO TABS
1.0000 | ORAL_TABLET | Freq: Four times a day (QID) | ORAL | Status: DC | PRN
  Administered 2017-03-08 – 2017-03-09 (×2): 2 via ORAL
  Administered 2017-03-09 – 2017-03-10 (×2): 1 via ORAL
  Administered 2017-03-11 – 2017-03-12 (×2): 2 via ORAL
  Filled 2017-03-08 (×6): qty 2
  Filled 2017-03-08: qty 1
  Filled 2017-03-08: qty 2

## 2017-03-08 MED ORDER — SUCCINYLCHOLINE CHLORIDE 200 MG/10ML IV SOSY
PREFILLED_SYRINGE | INTRAVENOUS | Status: DC | PRN
  Administered 2017-03-08: 100 mg via INTRAVENOUS

## 2017-03-08 MED ORDER — SODIUM CHLORIDE 0.9 % IV SOLN: Freq: Once | INTRAVENOUS | Status: DC

## 2017-03-08 MED ORDER — ROCURONIUM BROMIDE 50 MG/5ML IV SOSY
PREFILLED_SYRINGE | INTRAVENOUS | Status: DC | PRN
  Administered 2017-03-08: 20 mg via INTRAVENOUS

## 2017-03-08 MED ORDER — CARVEDILOL 6.25 MG PO TABS
6.2500 mg | ORAL_TABLET | Freq: Once | ORAL | Status: AC
Start: 2017-03-08 — End: 2017-03-08
  Administered 2017-03-08: 6.25 mg via ORAL
  Filled 2017-03-08: qty 1

## 2017-03-08 MED ORDER — DOCUSATE SODIUM 100 MG PO CAPS
100.0000 mg | ORAL_CAPSULE | Freq: Two times a day (BID) | ORAL | Status: DC
  Administered 2017-03-08 – 2017-03-12 (×4): 100 mg via ORAL
  Filled 2017-03-08 (×7): qty 1

## 2017-03-08 MED ORDER — SODIUM CHLORIDE 0.9 % IV SOLN
INTRAVENOUS | Status: DC
  Administered 2017-03-08 (×2): via INTRAVENOUS

## 2017-03-08 SURGICAL SUPPLY — 54 items
ADH SKN CLS APL DERMABOND .7 (GAUZE/BANDAGES/DRESSINGS) ×1
BAG SPEC THK2 15X12 ZIP CLS (MISCELLANEOUS) ×1
BAG ZIPLOCK 12X15 (MISCELLANEOUS) ×3 IMPLANT
BLADE SAW SGTL 11.0X1.19X90.0M (BLADE) IMPLANT
BLADE SAW SGTL 18X1.27X75 (BLADE) ×3 IMPLANT
BLADE SAW SGTL 18X1.27X75MM (BLADE) ×2
CAPT HIP HEMI 2 ×2 IMPLANT
CLOSURE WOUND 1/2 X4 (GAUZE/BANDAGES/DRESSINGS) ×2
COVER SURGICAL LIGHT HANDLE (MISCELLANEOUS) ×3 IMPLANT
DERMABOND ADVANCED (GAUZE/BANDAGES/DRESSINGS) ×2
DERMABOND ADVANCED .7 DNX12 (GAUZE/BANDAGES/DRESSINGS) ×1 IMPLANT
DRAPE INCISE IOBAN 66X45 STRL (DRAPES) ×2 IMPLANT
DRAPE ORTHO SPLIT 77X108 STRL (DRAPES) ×6
DRAPE POUCH INSTRU U-SHP 10X18 (DRAPES) ×3 IMPLANT
DRAPE SURG 17X11 SM STRL (DRAPES) ×3 IMPLANT
DRAPE SURG ORHT 6 SPLT 77X108 (DRAPES) ×2 IMPLANT
DRAPE U-SHAPE 47X51 STRL (DRAPES) ×3 IMPLANT
DRSG AQUACEL AG ADV 3.5X10 (GAUZE/BANDAGES/DRESSINGS) ×3 IMPLANT
DRSG TEGADERM 4X4.75 (GAUZE/BANDAGES/DRESSINGS) ×3 IMPLANT
DURAPREP 26ML APPLICATOR (WOUND CARE) ×3 IMPLANT
ELECT BLADE TIP CTD 4 INCH (ELECTRODE) ×3 IMPLANT
ELECT REM PT RETURN 15FT ADLT (MISCELLANEOUS) ×3 IMPLANT
EVACUATOR 1/8 PVC DRAIN (DRAIN) ×3 IMPLANT
FACESHIELD WRAPAROUND (MASK) ×12 IMPLANT
FACESHIELD WRAPAROUND OR TEAM (MASK) ×4 IMPLANT
GAUZE SPONGE 2X2 8PLY STRL LF (GAUZE/BANDAGES/DRESSINGS) ×1 IMPLANT
GLOVE BIOGEL M 7.0 STRL (GLOVE) IMPLANT
GLOVE BIOGEL PI IND STRL 7.5 (GLOVE) ×1 IMPLANT
GLOVE BIOGEL PI IND STRL 8.5 (GLOVE) ×1 IMPLANT
GLOVE BIOGEL PI INDICATOR 7.5 (GLOVE) ×2
GLOVE BIOGEL PI INDICATOR 8.5 (GLOVE) ×2
GLOVE ECLIPSE 8.0 STRL XLNG CF (GLOVE) IMPLANT
GLOVE ORTHO TXT STRL SZ7.5 (GLOVE) ×6 IMPLANT
GLOVE SURG ORTHO 8.0 STRL STRW (GLOVE) ×3 IMPLANT
GOWN STRL REUS W/TWL LRG LVL3 (GOWN DISPOSABLE) ×3 IMPLANT
GOWN STRL REUS W/TWL XL LVL3 (GOWN DISPOSABLE) ×6 IMPLANT
HANDPIECE INTERPULSE COAX TIP (DISPOSABLE)
IMMOBILIZER KNEE 20 (SOFTGOODS)
IMMOBILIZER KNEE 20 THIGH 36 (SOFTGOODS) IMPLANT
KIT BASIN OR (CUSTOM PROCEDURE TRAY) ×3 IMPLANT
MANIFOLD NEPTUNE II (INSTRUMENTS) ×3 IMPLANT
PACK TOTAL JOINT (CUSTOM PROCEDURE TRAY) ×3 IMPLANT
POSITIONER SURGICAL ARM (MISCELLANEOUS) ×3 IMPLANT
SET HNDPC FAN SPRY TIP SCT (DISPOSABLE) IMPLANT
SPONGE GAUZE 2X2 STER 10/PKG (GAUZE/BANDAGES/DRESSINGS) ×2
STRIP CLOSURE SKIN 1/2X4 (GAUZE/BANDAGES/DRESSINGS) ×4 IMPLANT
SUT ETHIBOND NAB CT1 #1 30IN (SUTURE) ×3 IMPLANT
SUT MNCRL AB 4-0 PS2 18 (SUTURE) ×3 IMPLANT
SUT VIC AB 1 CT1 36 (SUTURE) ×6 IMPLANT
SUT VIC AB 2-0 CT1 27 (SUTURE) ×6
SUT VIC AB 2-0 CT1 TAPERPNT 27 (SUTURE) ×2 IMPLANT
SUT VLOC 180 0 24IN GS25 (SUTURE) ×3 IMPLANT
TOWEL OR 17X26 10 PK STRL BLUE (TOWEL DISPOSABLE) ×6 IMPLANT
TRAY FOLEY W/METER SILVER 16FR (SET/KITS/TRAYS/PACK) ×3 IMPLANT

## 2017-03-08 NOTE — Progress Notes (Signed)
     Subjective: Day of Surgery Procedure(s) (LRB): ARTHROPLASTY BIPOLAR HIP (HEMIARTHROPLASTY) (Right)   Patient is resting comfortably in bed, and doesn't really acknowledge that I entered in the room. Have discussed with caregiver in the room.  He knows that she needs to have surgery for pain and any potential to ambulate again. Plan for surgery later today.  Objective:   VITALS:   Vitals:   03/08/17 0030 03/08/17 0629  BP: (!) 143/82 (!) 120/57  Pulse: 70 79  Resp: 17 18  Temp: 98.3 F (36.8 C) 99 F (37.2 C)  SpO2: 98% 97%    Dorsiflexion/Plantar flexion intact No cellulitis present Compartment soft  LABS  Recent Labs  03/05/17 0932 03/06/17 0526  HGB 11.2* 10.5*  HCT 35.5* 32.8*  WBC 16.9* 11.5*  PLT 207 162     Recent Labs  03/05/17 0932 03/06/17 0526 03/07/17 0648  NA 141 141 139  K 4.6 5.0 4.5  BUN 29* 31* 32*  CREATININE 1.34* 1.43* 1.42*  GLUCOSE 144* 142* 153*     Assessment/Plan: Day of Surgery Procedure(s) (LRB): ARTHROPLASTY BIPOLAR HIP (HEMIARTHROPLASTY) (Right)  NPO now Plan for surgery later today per Dr. Alvan Dame IV fluids order to help her stay hydrated    [place]. Brayli Klingbeil   PAC  03/08/2017, 8:48 AM

## 2017-03-08 NOTE — Progress Notes (Signed)
PROGRESS NOTE                                                                                                                                                                                                             Patient Demographics:    [name], is a 83 y.o. female, DOB - April 26, 1928, YKD:[id number]  Admit date - 03/05/2017   Admitting Physician Dron Tanna Furry, MD  Outpatient Primary MD for the patient is Tonette Bihari, MD  LOS - 3  Outpatient Specialists:none  Chief Complaint  Patient presents with  . Fall       Brief Narrative  83 year old female with chronic A. fib on anticoagulation, diastolic CHF, COPD, severe dementia with behavioral disturbance, LBBB, hypertension, hyperlipidemia, renal neoplasm brought to the ED from home after sustaining a mechanical fall and landed on her hip. She was found to be agitated in the ED and unable to provide any history. In the ED x-ray was consistent with right hip fracture. Orthopedics consulted who recommended to hold anticoagulation and plan for surgery on 9/9.    Subjective:   Patient lying Coumadin in bed. Remains confused. Heart a better after increasing beta blocker dose yesterday.  Assessment  & Plan :    Active Problems:   Hip fracture, Right, closed, subsequent encounter ([place]) Pain control with when necessary Vicodin and low-dose IV morphine. Won't escalate narcotics further. Added bowel regimen. Orthopedic plan on surgery This afternoon.   Preoperative cardiac clearance. As per gupta perioperative cardiac risk score, patients estimated perioperative including 30 day postoperative risk for myocardial infarction and or cardiac arrest is 1.75%. Patient is a euvolemic on exam. EKG shows A. fib with old LBBB (unchanged from prior). 2-D echo from February 2018 with normal EF of 55-60% with no wall motion abnormality. Patient is on beta blocker at  home which is continued perioperatively.  She does not need further cardiac or respiratory preoperative evaluation. She was also seen by her cardiologist about 1 week ago and recommended to continue her current medications.  Chronic/persistent atrial fibrillation Eliquis held for surgery. Increased Coreg dose given persistent tachycardia and heart rate now better controlled.  Chronic kidney disease stage III Creatinine around baseline. Avoid nephrotoxins. Continue hydration.  Oliguria Suspected to dehydration. Monitor with fluids.  Leukocytosis Likely stress related. No signs of infection.  Vascular/mixed  dementia with agitation Continue when necessary Haldol and Seroquel.Safety sitter as needed.  Essential hypertension Elevated blood pressure due to pain and agitation. Improved after increasing carvedilol dose.    Code Status : DO NOT RESUSCITATE  Family Communication  :None  at bedside  Disposition Plan  : Pending postop course  Barriers For Discharge : Pending surgery  Consults  :  [place] [facility]  Procedures  : None  DVT Prophylaxis  :  none  Lab Results  Component Value Date   PLT 162 03/06/2017    Antibiotics  :   Anti-infectives    Start     Dose/Rate Route Frequency Ordered Stop   03/08/17 1139  ceFAZolin (ANCEF) 2-4 GM/100ML-% IVPB    Comments:  Christell Faith   : cabinet override      03/08/17 1139 03/08/17 2344        Objective:   Vitals:   03/07/17 1326 03/07/17 1900 03/08/17 0030 03/08/17 0629  BP: (!) 162/77 135/60 (!) 143/82 (!) 120/57  Pulse: 88 63 70 79  Resp: 18 19 17 18   Temp: 98.8 F (37.1 C) 98.9 F (37.2 C) 98.3 F (36.8 C) 99 F (37.2 C)  TempSrc: Oral Oral Oral Oral  SpO2: 96% 97% 98% 97%  Weight:      Height:  5\' 7"  (1.702 m)      Wt Readings from Last 3 Encounters:  03/05/17 65.8 kg (145 lb)  03/03/17 65.8 kg (145 lb)  02/24/17 65.2 kg (143 lb 12.8 oz)     Intake/Output Summary (Last 24 hours) at 03/08/17  1154 Last data filed at 03/08/17 0600  Gross per 24 hour  Intake              600 ml  Output              550 ml  Net               50 ml     Physical Exam Gen.: Not in distress, confused HEENT: Moist mucosa, supple neck Chest: Clear bilaterally  CVS:S1 and S2 irregular, no murmurs GI: Soft, nondistended, nontender Musculoskeletal: Warm, no edema, limited mobility of right hip.      Data Review:    CBC  Recent Labs Lab 03/05/17 0932 03/06/17 0526  WBC 16.9* 11.5*  HGB 11.2* 10.5*  HCT 35.5* 32.8*  PLT 207 162  MCV 97.0 95.3  MCH 30.6 30.5  MCHC 31.5 32.0  RDW 13.9 14.0  LYMPHSABS 1.1  --   MONOABS 0.7  --   EOSABS 0.1  --   BASOSABS 0.0  --     Chemistries   Recent Labs Lab 03/05/17 0932 03/06/17 0526 03/07/17 0648  NA 141 141 139  K 4.6 5.0 4.5  CL 109 108 107  CO2 23 25 23   GLUCOSE 144* 142* 153*  BUN 29* 31* 32*  CREATININE 1.34* 1.43* 1.42*  CALCIUM 9.5 9.1 9.4   ------------------------------------------------------------------------------------------------------------------ No results for input(s): CHOL, HDL, LDLCALC, TRIG, CHOLHDL, LDLDIRECT in the last 72 hours.  Lab Results  Component Value Date   HGBA1C 6.2 01/01/2017   ------------------------------------------------------------------------------------------------------------------ No results for input(s): TSH, T4TOTAL, T3FREE, THYROIDAB in the last 72 hours.  Invalid input(s): FREET3 ------------------------------------------------------------------------------------------------------------------ No results for input(s): VITAMINB12, FOLATE, FERRITIN, TIBC, IRON, RETICCTPCT in the last 72 hours.  Coagulation profile No results for input(s): INR, PROTIME in the last 168 hours.  No results for input(s): DDIMER in the last 72 hours.  Cardiac Enzymes  Recent Labs Lab 03/05/17 0932  TROPONINI <0.03    ------------------------------------------------------------------------------------------------------------------    Component Value Date/Time   BNP 199.9 (H) 07/26/2015 1520    Inpatient Medications  Scheduled Meds: . atorvastatin  80 mg Oral QHS  . calcitRIOL  0.25 mcg Oral Q M,W,F  . carvedilol  12.5 mg Oral BID WC  . docusate sodium  200 mg Oral BID  . famotidine  10 mg Oral BID  . feeding supplement (ENSURE ENLIVE)  237 mL Oral BID BM  . loratadine  10 mg Oral Daily  . losartan  50 mg Oral Daily  . QUEtiapine  50 mg Oral QHS  . vitamin B-12  100 mcg Oral Daily   Continuous Infusions: . sodium chloride 75 mL/hr at 03/08/17 1006  . sodium chloride    . ceFAZolin     PRN Meds:.diltiazem, haloperidol, haloperidol lactate, HYDROcodone-acetaminophen, ipratropium-albuterol, morphine injection, senna-docusate  Micro Results No results found for this or any previous visit (from the past 240 hour(s)).  Radiology Reports Dg Chest 1 View  Result Date: 03/05/2017 CLINICAL DATA:  Examination. No current chest complaints. History of CHF, COPD, hypertension, diabetes. EXAM: CHEST 1 VIEW COMPARISON:  PA and lateral chest x-ray of May 12, 2013 FINDINGS: The lungs are well-expanded. There is no focal infiltrate. There is no pleural effusion. The cardiac silhouette is enlarged. The pulmonary vascularity is not engorged. There is calcification in the wall of the thoracic aorta. There is multilevel degenerative disc disease of the thoracic spine. There are degenerative changes of the right shoulder. IMPRESSION: There is mild enlarged of the cardiac silhouette without significant pulmonary vascular congestion or pulmonary edema. No acute pneumonia. Thoracic aortic atherosclerosis. Electronically Signed   By: David  Martinique M.D.   On: 03/05/2017 09:18   Dg Hip Unilat With Pelvis 2-3 Views Right  Result Date: 03/05/2017 CLINICAL DATA:  Found on the floor by patient's husband complaining of  severe right hip pain with obvious deformity. EXAM: DG HIP (WITH OR WITHOUT PELVIS) 2-3V RIGHT COMPARISON:  No recent studies in [facility] FINDINGS: The patient has sustained an acute subcapital fracture of the right hip. There is mild superior migration of the femoral neck and intertrochanteric region. There appears to be a fracture through the greater trochanter as well. The bony pelvis is intact though subjectively osteopenic. The pubic rami appear intact. There are degenerative changes of the lower lumbar spine. The left hip is grossly normal. IMPRESSION: The patient has sustained an acute subcapital fracture of the right hip. A fracture extends through the greater trochanter as well. Electronically Signed   By: David  Martinique M.D.   On: 03/05/2017 09:17    Time Spent in minutes  25   Louellen Molder M.D on 03/08/2017 at 11:54 AM  Between 7am to 7pm - Pager - [phone]  After 7pm go to [email or web address] - password [facility]  [facility] -  Office  [phone]

## 2017-03-08 NOTE — Anesthesia Postprocedure Evaluation (Signed)
Anesthesia Post Note  Patient: [name]  Procedure(s) Performed: Procedure(s) (LRB): ARTHROPLASTY BIPOLAR HIP (HEMIARTHROPLASTY) (Right)     Patient location during evaluation: PACU Anesthesia Type: General Level of consciousness: awake and alert Pain management: pain level controlled Vital Signs Assessment: post-procedure vital signs reviewed and stable Respiratory status: spontaneous breathing, nonlabored ventilation, respiratory function stable and patient connected to nasal cannula oxygen Cardiovascular status: blood pressure returned to baseline and stable Postop Assessment: no signs of nausea or vomiting Anesthetic complications: no    Last Vitals:  Vitals:   03/08/17 1430 03/08/17 1446  BP: 110/69 (!) 131/45  Pulse: 82 77  Resp: 19 18  Temp: 36.4 C 36.5 C  SpO2: 100% 100%    Last Pain:  Vitals:   03/08/17 1446  TempSrc: Oral  PainSc: Asleep                 Hanan Moen S

## 2017-03-08 NOTE — Progress Notes (Signed)
Pt has not had any output this shift.  Her external catheter is working properly and was emptied of more than 500cc last pm at 1830. She is NPO for OR and she does not have any IV fluids ordered. BUN/Creat unchanged from yesterday. I am sure pt will be getting IV fluids prior to OR, will report to oncoming RN to make sure observation is continued. Janice Seales P Leeam Cedrone

## 2017-03-08 NOTE — Op Note (Signed)
NAME:  [name]                ACCOUNT NO.:  [id number]   MEDICAL RECORD NO.: [id number]   LOCATION:  3664                         FACILITY:  [facility]   DATE OF BIRTH:  09/14/1927  PHYSICIAN:  Pietro Cassis. Alvan Dame, M.D.     DATE OF PROCEDURE:  03/08/17                               OPERATIVE REPORT     PREOPERATIVE DIAGNOSIS:  Right displaced femoral neck fracture.   POSTOPERATIVE DIAGNOSIS:  Right displaced femoral neck fracture.   PROCEDURE:  Right hip hemiarthroplasty utilizing DePuy component, size 6 standard Tri-Lock stem with a 47 mm unipolar ball with a +0 adapter.   SURGEON:  Pietro Cassis. Alvan Dame, MD   ASSISTANT:  Danae Orleans, PA-C.   ANESTHESIA:  General.   SPECIMENS:  None.   DRAINS:  none.   BLOOD LOSS:  About 50 cc.   COMPLICATIONS:  None.   INDICATION OF PROCEDURE:  [name] is a pleasant 83 year old female who lives independently.  She unfortunately had an unwitnessed fall at her house and was found by her husband.  She was admitted to the hospital after radiographs revealed a femoral neck fracture.  She was seen and evaluated and was scheduled for surgery for fixation.  The necessity of surgical repair was discussed with she and her family.  Consent was obtained after reviewing risks of infection, DVT, component failure, and need for revision surgery.   PROCEDURE IN DETAIL:  The patient was brought to the operative theater. Once adequate anesthesia, preoperative antibiotics, 2 g of Ancef administered, the patient was positioned into the left lateral decubitus position with the right side up.  The right lower extremity was then prepped and draped in sterile fashion.  A time-out was performed identifying the patient, planned procedure, and extremity.   A lateral incision was made off the proximal trochanter. Sharp dissection was carried down to the iliotibial band and gluteal fascia. The gluteal fascia was then incised for posterior approach.  The  short external rotators were taken down separate from the posterior capsule. An L capsulotomy was made preserving the posterior leaflet for later anatomic repair. Fracture site was identified and after removing comminuted segments of the posterior femoral neck, the femoral head was removed without difficulty and measured on the back table  using the sizing rings and determined to be 47 mm in diameter.   The proximal femur was then exposed.  Retractors placed.  I then drilled, opened the proximal femur.  Then I hand reamed once and  Irrigated the canal to try to prevent fat emboli.  I began broaching the femur with a starting broach up to a size 6 broach with good medial and lateral metaphyseal fit without evidence of any torsion or movement.  A trial reduction was carried out with a standard neck and a +0 adapter with a 47 mm unipolar ball.  The hip reduced nicely.  The leg lengths appeared to be equal compared to the down leg.   The hip went through a range of motion without evidence of any subluxation or impingement.   Given these findings, the trial components removed.  The final 6 standard  Tri-Lock stem was opened.  After irrigating the canal, the final stem was impacted and sat at the level where the broach was. Based on this and the trial reduction, a +0 adapter was opened and impacted in the 47 mm unipolar ball onto a clean and dry trunnion.  The hip had been irrigated throughout the case and again at this point.  I re- Approximated the posterior capsule to the superior leaflet using a  #1 Vicryl.  The remainder of the wound was closed with #1 Vicryl and #1 Stratafix in the iliotibial band and gluteal fascia, a  2-0 Vicryl in the sub-Q tissue and a running 4-0 Monocryl in the skin.  The hip was cleaned, dried, and dressed sterilely using Dermabond and Aquacel dressing.  She was then brought to recovery room, extubated in stable condition, tolerating the procedure  well.  Danae Orleans, PA-C was present and utilized as Environmental consultant for the entire case from  Preoperative positioning to management of the contralateral extremity and retractors to  General facilitation of the procedure.  He was also involved with primary wound closure.         Pietro Cassis Alvan Dame, M.D.

## 2017-03-08 NOTE — Anesthesia Preprocedure Evaluation (Signed)
Anesthesia Evaluation  Patient identified by MRN, date of birth, ID band Patient awake    Reviewed: Allergy & Precautions, NPO status , Patient's Chart, lab work & pertinent test results  Airway Mallampati: II  TM Distance: >3 FB Neck ROM: Limited    Dental no notable dental hx.    Pulmonary neg pulmonary ROS,    Pulmonary exam normal breath sounds clear to auscultation       Cardiovascular hypertension, + Peripheral Vascular Disease  + dysrhythmias Atrial Fibrillation  Rhythm:Regular Rate:Normal + Systolic murmurs Left ventricle: The cavity size was normal. There was mild   concentric hypertrophy. Systolic function was normal. The   estimated ejection fraction was in the range of 55% to 60%. Wall   motion was normal; there were no regional wall motion   abnormalities. The study is not technically sufficient to allow   evaluation of LV diastolic function. - Mitral valve: Moderately calcified, moderately thickened annulus. - Left atrium: The atrium was moderately dilated. - Right ventricle: The cavity size was normal. Wall thickness was   normal. Systolic function was normal. - Tricuspid valve: There was mild regurgitation. - Pulmonary arteries: Systolic pressure was mildly increased. PA   peak pressure: 33 mm Hg (S). - Inferior vena cava: The vessel was normal in size. The   respirophasic diameter changes were in the normal range (= 50%),   consistent with normal central venous pressure. - Pericardium, extracardiac: A small pericardial effusion was   identified circumferential to the heart.  Transthoracic echocardiography.  M-mode, complete 2D, spectral Doppler, and color Doppler.  Birthdate:  Patient birthdate: 1928-04-10.  Age:  Patient is 83 yr old.  Sex:  Gender: female. BMI: 24.8 kg/m^2.  Blood pressure:     114/56  Patient status: Outpatient.  Study date:  Study date: 08/24/2015. Study time: 04:03 PM.  Location:   [facility]  -------------------------------------------------------------------  ------------------------------------------------------------------- Left ventricle:  The cavity size was normal. There was mild concentric hypertrophy. Systolic function was normal. The estimated ejection fraction was in the range of 55% to 60%. Wall motion was normal; there were no regional wall motion abnormalities. The study is not technically sufficient to allow evaluation of LV diastolic function.  ------------------------------------------------------------------- Aortic valve:   Trileaflet; normal thickness, mildly calcified leaflets. Mobility was not restricted.  Doppler:  Transvalvular velocity was within the normal range. There was no stenosis. There was no regurgitation.  ------------------------------------------------------------------- Aorta:  Aortic root: The aortic root was normal in size.  ------------------------------------------------------------------- Mitral valve:   Moderately calcified, moderately thickened annulus. Mobility was not restricted.  Doppler:  Transvalvular velocity was within the normal range. There was no evidence for stenosis. There was trivial regurgitation.    Peak gradient (D): 4 mm Hg.   Neuro/Psych dementia negative psych ROS   GI/Hepatic negative GI ROS, Neg liver ROS,   Endo/Other  diabetes  Renal/GU Renal InsufficiencyRenal disease  negative genitourinary   Musculoskeletal negative musculoskeletal ROS (+)   Abdominal   Peds negative pediatric ROS (+)  Hematology  (+) anemia ,   Anesthesia Other Findings   Reproductive/Obstetrics negative OB ROS                             Anesthesia Physical Anesthesia Plan  ASA: III and emergent  Anesthesia Plan: General   Post-op Pain Management:    Induction: Intravenous  PONV Risk Score and Plan: 1 and Ondansetron and Dexamethasone  Airway Management  Planned: Oral ETT  Additional Equipment:   Intra-op Plan:   Post-operative Plan: Extubation in OR  Informed Consent: I have reviewed the patients History and Physical, chart, labs and discussed the procedure including the risks, benefits and alternatives for the proposed anesthesia with the patient or authorized representative who has indicated his/her understanding and acceptance.   Dental advisory given  Plan Discussed with: CRNA and Surgeon  Anesthesia Plan Comments:         Anesthesia Quick Evaluation

## 2017-03-08 NOTE — Anesthesia Procedure Notes (Signed)
Procedure Name: Intubation Date/Time: 03/08/2017 12:19 PM Performed by: West Pugh Pre-anesthesia Checklist: Patient identified, Emergency Drugs available, Suction available, Patient being monitored and Timeout performed Patient Re-evaluated:Patient Re-evaluated prior to induction Oxygen Delivery Method: Circle system utilized Preoxygenation: Pre-oxygenation with 100% oxygen Induction Type: IV induction, Cricoid Pressure applied and Rapid sequence Laryngoscope Size: Mac and 4 Grade View: Grade II Tube type: Oral Tube size: 7.0 mm Number of attempts: 1 Airway Equipment and Method: Stylet Placement Confirmation: ETT inserted through vocal cords under direct vision,  positive ETCO2,  CO2 detector and breath sounds checked- equal and bilateral Secured at: 22 cm Tube secured with: Tape Dental Injury: Teeth and Oropharynx as per pre-operative assessment  Comments: Large epiglottis

## 2017-03-08 NOTE — Transfer of Care (Signed)
Immediate Anesthesia Transfer of Care Note  Patient: [name]  Procedure(s) Performed: Procedure(s): ARTHROPLASTY BIPOLAR HIP (HEMIARTHROPLASTY) (Right)  Patient Location: PACU  Anesthesia Type:General  Level of Consciousness:  sedated, patient cooperative and responds to stimulation  Airway & Oxygen Therapy:Patient Spontanous Breathing and Patient connected to face mask oxgen  Post-op Assessment:  Report given to PACU RN and Post -op Vital signs reviewed and stable  Post vital signs:  Reviewed and stable  Last Vitals:  Vitals:   03/08/17 0629 03/08/17 1338  BP: (!) 120/57   Pulse: 79   Resp: 18   Temp: 37.2 C (P) 36.8 C  SpO2: 59%     Complications: No apparent anesthesia complications

## 2017-03-09 ENCOUNTER — Encounter (HOSPITAL_COMMUNITY): Payer: Self-pay | Admitting: Orthopedic Surgery

## 2017-03-09 DIAGNOSIS — D696 Thrombocytopenia, unspecified: Secondary | ICD-10-CM

## 2017-03-09 DIAGNOSIS — N179 Acute kidney failure, unspecified: Secondary | ICD-10-CM

## 2017-03-09 DIAGNOSIS — D62 Acute posthemorrhagic anemia: Secondary | ICD-10-CM

## 2017-03-09 LAB — POCT I-STAT 4, (NA,K, GLUC, HGB,HCT)
Glucose, Bld: 120 mg/dL — ABNORMAL HIGH (ref 65–99)
HEMATOCRIT: 27 % — AB (ref 36.0–46.0)
HEMOGLOBIN: 9.2 g/dL — AB (ref 12.0–15.0)
POTASSIUM: 5.2 mmol/L — AB (ref 3.5–5.1)
SODIUM: 140 mmol/L (ref 135–145)

## 2017-03-09 LAB — CBC
HEMATOCRIT: 26.6 % — AB (ref 36.0–46.0)
HEMOGLOBIN: 8.7 g/dL — AB (ref 12.0–15.0)
MCH: 31.4 pg (ref 26.0–34.0)
MCHC: 32.7 g/dL (ref 30.0–36.0)
MCV: 96 fL (ref 78.0–100.0)
Platelets: 111 10*3/uL — ABNORMAL LOW (ref 150–400)
RBC: 2.77 MIL/uL — AB (ref 3.87–5.11)
RDW: 13.8 % (ref 11.5–15.5)
WBC: 10.2 10*3/uL (ref 4.0–10.5)

## 2017-03-09 LAB — BASIC METABOLIC PANEL
Anion gap: 9 (ref 5–15)
BUN: 53 mg/dL — AB (ref 6–20)
CHLORIDE: 107 mmol/L (ref 101–111)
CO2: 22 mmol/L (ref 22–32)
CREATININE: 2 mg/dL — AB (ref 0.44–1.00)
Calcium: 8.5 mg/dL — ABNORMAL LOW (ref 8.9–10.3)
GFR calc Af Amer: 25 mL/min — ABNORMAL LOW (ref >60)
GFR calc non Af Amer: 21 mL/min — ABNORMAL LOW (ref >60)
Glucose, Bld: 182 mg/dL — ABNORMAL HIGH (ref 65–99)
POTASSIUM: 5.2 mmol/L — AB (ref 3.5–5.1)
Sodium: 138 mmol/L (ref 135–145)

## 2017-03-09 LAB — GLUCOSE, CAPILLARY: GLUCOSE-CAPILLARY: 141 mg/dL — AB (ref 65–99)

## 2017-03-09 NOTE — Evaluation (Signed)
Occupational Therapy Evaluation Patient Details Name: [name] MRN: [id number] DOB: 24-Jun-1928 Today's Date: 03/09/2017    History of Present Illness   [name] is a 83 y.o. female with medical history significant of chronic atrial fibrillation, renal neoplasm, diastolic congestive heart failure chronic, COPD, vascular/mixed dementia with behavioral disturbance, left bundle branch block, hypertension fell at home, sustained femoral neck fracture, ARTHROPLASTY BIPOLAR HIP (HEMIARTHROPLASTY) (Right) on 03/08/17   Clinical Impression   Pt is s/p THA resulting in the deficits listed below (see OT Problem List).  Pt will benefit from skilled OT to increase their safety and independence with ADL and functional mobility for ADL to facilitate discharge to venue listed below.        Follow Up Recommendations  SNF    Equipment Recommendations  Other (comment)       Precautions / Restrictions Precautions Precautions: Posterior Hip Restrictions Weight Bearing Restrictions: Yes RLE Weight Bearing: Weight bearing as tolerated      Mobility Bed Mobility Overal bed mobility: Needs Assistance Bed Mobility: Supine to Sit;Sit to Supine     Supine to sit: Total assist;+2 for physical assistance;+2 for safety/equipment;HOB elevated Sit to supine: Total assist;+2 for physical assistance;+2 for safety/equipment   General bed mobility comments: assist with the legs and trunk for all aspects of bed mobility, sitting up and lying down. the patient is more rigid and resistive.  Transfers Overall transfer level: Needs assistance Equipment used: Rolling walker (2 wheeled) Transfers: Sit to/from Stand Sit to Stand: Total assist;+2 physical assistance;+2 safety/equipment;From elevated surface         General transfer comment: assisted to stand with 2 assist at RW, Hand o0ver hand to palce hands on RW. Assist to power up, patient pushing posteriorly, feet out in front, does not  get trunk  over legs. Stood x 2 trials. , unable to safely pivot to recliner.    Balance Overall balance assessment: Needs assistance;History of Falls Sitting-balance support: Bilateral upper extremity supported;Feet supported Sitting balance-Leahy Scale: Poor   Postural control: Posterior lean Standing balance support: During functional activity;Bilateral upper extremity supported Standing balance-Leahy Scale: Zero                             ADL either performed or assessed with clinical judgement   ADL Overall ADL's : Needs assistance/impaired                                       General ADL Comments: Pt total A with ADL activity this OT visit     Vision Patient Visual Report: No change from baseline              Pertinent Vitals/Pain Pain Assessment: No/denies pain Faces Pain Scale: Hurts even more Pain Location: right hip Pain Descriptors / Indicators: Discomfort;Grimacing;Guarding;Moaning Pain Intervention(s): Limited activity within patient's tolerance;Ice applied;Monitored during session     Hand Dominance     Extremity/Trunk Assessment Upper Extremity Assessment Upper Extremity Assessment: Generalized weakness   Lower Extremity Assessment Lower Extremity Assessment: RLE deficits/detail;LLE deficits/detail RLE Deficits / Details: patient presents with plantarflexed foot, patient does not move the leg on the bed, decreased knee flexion in sitting. did bear a little weight standing LLE Deficits / Details: knee flexion is more than right, able to bear weight   Cervical / Trunk Assessment Cervical / Trunk Assessment: Kyphotic  Communication Communication Communication: HOH   Cognition Arousal/Alertness: Awake/alert Behavior During Therapy: Anxious Overall Cognitive Status: History of cognitive impairments - at baseline Area of Impairment: Orientation;Memory;Following commands;Safety/judgement;Awareness;Problem solving                  Orientation Level: Place;Time;Situation     Following Commands: Follows one step commands inconsistently       General Comments: The patient has expressive difficulties. when spouse arrived, the patient brightened up and said" There you are"   General Comments       Exercises     Shoulder Instructions      Home Living Family/patient expects to be discharged to:: Private residence Living Arrangements: Spouse/significant other Available Help at Discharge: Family Type of Home: House Home Access: Level entry     [place]: One level     Bathroom Shower/Tub: Teacher, early years/pre: Handicapped height     Home Equipment: Wheelchair - manual          Prior Functioning/Environment Level of Independence: Independent        Comments: with husband providing S        OT Problem List: Decreased strength;Decreased activity tolerance;Decreased knowledge of use of DME or AE;Decreased safety awareness;Decreased cognition;Impaired balance (sitting and/or standing);Decreased knowledge of precautions      OT Treatment/Interventions: Self-care/ADL training;Patient/family education;DME and/or AE instruction    OT Goals(Current goals can be found in the care plan section) Acute Rehab OT Goals Patient Stated Goal: per spouse, to take patient home OT Goal Formulation: With patient Time For Goal Achievement: 03/23/17 Potential to Achieve Goals: Good  OT Frequency: Min 2X/week   Barriers to D/C:            Co-evaluation PT/OT/SLP Co-Evaluation/Treatment: Yes Reason for Co-Treatment: For patient/therapist safety PT goals addressed during session: Mobility/safety with mobility OT goals addressed during session: ADL's and self-care      AM-PAC PT "6 Clicks" Daily Activity     Outcome Measure Help from another person eating meals?: A Lot Help from another person taking care of personal grooming?: A Lot Help from another person toileting, which includes  using toliet, bedpan, or urinal?: Total Help from another person bathing (including washing, rinsing, drying)?: Total Help from another person to put on and taking off regular upper body clothing?: Total Help from another person to put on and taking off regular lower body clothing?: Total 6 Click Score: 8   End of Session Equipment Utilized During Treatment: Rolling walker;Gait belt Nurse Communication: Mobility status  Activity Tolerance: Patient limited by pain Patient left: in bed;with call bell/phone within reach;with bed alarm set;with family/visitor present  OT Visit Diagnosis: Unsteadiness on feet (R26.81);Pain;Muscle weakness (generalized) (M62.81)                Time: 0940-1000 OT Time Calculation (min): 20 min Charges:  OT General Charges $OT Visit: 1 Visit OT Evaluation $OT Eval Moderate Complexity: 1 Mod G-Codes:     Kari Baars, [place] [phone]  Payton Mccallum D 03/09/2017, 12:47 PM

## 2017-03-09 NOTE — Progress Notes (Signed)
Patient ID: [name], female   DOB: February 06, 1928, 83 y.o.   MRN: [id number] Subjective: 1 Day Post-Op Procedure(s) (LRB): ARTHROPLASTY BIPOLAR HIP (HEMIARTHROPLASTY) (Right)    Patient demented.  No clinical events overnight.  Husband not in room this am  Objective:   VITALS:   Vitals:   03/09/17 0503 03/09/17 0927  BP: (!) 100/51 139/63  Pulse: 76 73  Resp: 20   Temp: 98.1 F (36.7 C)   SpO2: 98%     Neurovascular intact Incision: dressing C/D/I  LABS  Recent Labs  03/08/17 1244 03/09/17 0533  HGB 9.2* 8.7*  HCT 27.0* 26.6*  WBC  --  10.2  PLT  --  111*     Recent Labs  03/07/17 0648 03/08/17 1244 03/09/17 0533  NA 139 140 138  K 4.5 5.2* 5.2*  BUN 32*  --  53*  CREATININE 1.42*  --  2.00*  GLUCOSE 153* 120* 182*    No results for input(s): LABPT, INR in the last 72 hours.   Assessment/Plan: 1 Day Post-Op Procedure(s) (LRB): ARTHROPLASTY BIPOLAR HIP (HEMIARTHROPLASTY) (Right)   Advance diet Up with therapy Plan for discharge tomorrow versus Wednesday Her husband is very keen on taking her home, please work with him to try and make this happen if safely possible RTC in 2 weeks Minimize pain meds to limit effect on memory

## 2017-03-09 NOTE — Care Management Note (Signed)
Case Management Note  Patient Details  Name: [name] MRN: [id number] Date of Birth: [birth year]  Subjective/Objective:                  Fracture and repair of hip  Action/Plan:  Date:  March 09, 2017 Chart reviewed for concurrent status and case management needs.  Will continue to follow patient progress.  Discharge Planning: following for needs  Expected discharge date: [id number]  Velva Harman, BSN, [place], [place]   Expected Discharge Date:   (unknown)               Expected Discharge Plan:  [place]  In-House Referral:  Clinical Social Work  Discharge planning Services  CM Consult  Post Acute Care Choice:    Choice offered to:     DME Arranged:    DME Agency:     HH Arranged:    [place] Agency:     Status of Service:  In process, will continue to follow  If discussed at Long Length of Stay Meetings, dates discussed:    Additional Comments:  Leeroy Cha, RN 03/09/2017, 9:44 AM

## 2017-03-09 NOTE — Progress Notes (Signed)
PROGRESS NOTE                                                                                                                                                                                                             Patient Demographics:    [name], is a 83 y.o. female, DOB - June 18, 1928, ZOX:[id number]  Admit date - 03/05/2017   Admitting Physician Dron Tanna Furry, MD  Outpatient Primary MD for the patient is Tonette Bihari, MD  LOS - 4  Outpatient Specialists:none  Chief Complaint  Patient presents with  . Fall       Brief Narrative  83 year old female with chronic A. fib on anticoagulation, diastolic CHF, COPD, severe dementia with behavioral disturbance, LBBB, hypertension, hyperlipidemia, renal neoplasm brought to the ED from home after sustaining a mechanical fall and landed on her hip. She was found to be agitated in the ED and unable to provide any history. In the ED x-ray was consistent with right hip fracture. Orthopedics consulted who recommended to hold anticoagulation and plan for surgery on 9/9.    Subjective:   Patient underwent right hemiarthroplasty yesterday. Tolerated well. Remains very confused this morning.  Assessment  & Plan :    Active Problems:   Hip fracture, Right, closed, subsequent encounter [facility]) Status post right hemiarthroplasty on 9/9. Tolerated well. Pain control with when necessary Vicodin and low-dose IV morphine. Continue bowel regimen.  PT recommends SNF versus home with supervision (reportedly husband wish to take her home). Continue Foley catheter for today to monitor urine output.  Preoperative cardiac clearance. As per gupta perioperative cardiac risk score, patients estimated perioperative including 30 day postoperative risk for myocardial infarction and or cardiac arrest is 1.75%. Continue perioperative beta blocker.    Chronic/persistent atrial  fibrillation Eliquis resumed after surgery. Heart rate improved after increasing Coreg dose.  Acute on Chronic kidney disease stage III Possibly due to dehydration with poor urinary output. Continue IV fluids and monitor.  Postoperative blood loss anemia Monitor H&H daily.  Leukocytosis Likely stress related. No signs of infection.  Vascular/mixed dementia with agitation Continue when necessary Haldol and Seroquel.Safety sitter as needed.  Essential hypertension Elevated blood pressure due to pain and agitation. Improved after increasing carvedilol dose.  Thrombocytopenia No clear etiology. Recheck in AM.  Code Status : DO NOT RESUSCITATE  Family  Communication  :None  at bedside  Disposition Plan  : Home versus SNF in 1-2 days.  Barriers For Discharge : Postop  Consults  :  [facility]  Procedures  : None  DVT Prophylaxis  :  none  Lab Results  Component Value Date   PLT 111 (L) 03/09/2017    Antibiotics  :   Anti-infectives    Start     Dose/Rate Route Frequency Ordered Stop   03/08/17 1800  ceFAZolin (ANCEF) IVPB 1 g/50 mL premix     1 g 100 mL/hr over 30 Minutes Intravenous Every 6 hours 03/08/17 1642 03/09/17 0010   03/08/17 1139  ceFAZolin (ANCEF) 2-4 GM/100ML-% IVPB    Comments:  Christell Faith   : cabinet override      03/08/17 1139 03/08/17 2344        Objective:   Vitals:   03/08/17 2054 03/09/17 0000 03/09/17 0503 03/09/17 0927  BP: 119/60 (!) 115/54 (!) 100/51 139/63  Pulse: 76 77 76 73  Resp: 15 16 20    Temp: (!) 97.5 F (36.4 C) (!) 97.5 F (36.4 C) 98.1 F (36.7 C)   TempSrc: Oral Oral Oral   SpO2: 97% 97% 98%   Weight:      Height:        Wt Readings from Last 3 Encounters:  03/05/17 65.8 kg (145 lb)  03/03/17 65.8 kg (145 lb)  02/24/17 65.2 kg (143 lb 12.8 oz)     Intake/Output Summary (Last 24 hours) at 03/09/17 1110 Last data filed at 03/09/17 0902  Gross per 24 hour  Intake          2591.67 ml  Output              1225 ml  Net          1366.67 ml     Physical Exam Gen.: Elderly female patient is confused, not in distress HEENT: Moist mucosa, supple neck Chest: Clear bilaterally CVS: S1 and S2 irregular, no murmurs GI: Soft, nondistended, nontender, Foley + Musculoskeletal: Dressing over the right hip, no edema CNS: Oriented 0      Data Review:    CBC  Recent Labs Lab 03/05/17 0932 03/06/17 0526 03/08/17 1244 03/09/17 0533  WBC 16.9* 11.5*  --  10.2  HGB 11.2* 10.5* 9.2* 8.7*  HCT 35.5* 32.8* 27.0* 26.6*  PLT 207 162  --  111*  MCV 97.0 95.3  --  96.0  MCH 30.6 30.5  --  31.4  MCHC 31.5 32.0  --  32.7  RDW 13.9 14.0  --  13.8  LYMPHSABS 1.1  --   --   --   MONOABS 0.7  --   --   --   EOSABS 0.1  --   --   --   BASOSABS 0.0  --   --   --     Chemistries   Recent Labs Lab 03/05/17 0932 03/06/17 0526 03/07/17 0648 03/08/17 1244 03/09/17 0533  NA 141 141 139 140 138  K 4.6 5.0 4.5 5.2* 5.2*  CL 109 108 107  --  107  CO2 23 25 23   --  22  GLUCOSE 144* 142* 153* 120* 182*  BUN 29* 31* 32*  --  53*  CREATININE 1.34* 1.43* 1.42*  --  2.00*  CALCIUM 9.5 9.1 9.4  --  8.5*   ------------------------------------------------------------------------------------------------------------------ No results for input(s): CHOL, HDL, LDLCALC, TRIG, CHOLHDL, LDLDIRECT in the last 72 hours.  Lab Results  Component Value Date   HGBA1C 6.2 01/01/2017   ------------------------------------------------------------------------------------------------------------------ No results for input(s): TSH, T4TOTAL, T3FREE, THYROIDAB in the last 72 hours.  Invalid input(s): FREET3 ------------------------------------------------------------------------------------------------------------------ No results for input(s): VITAMINB12, FOLATE, FERRITIN, TIBC, IRON, RETICCTPCT in the last 72 hours.  Coagulation profile No results for input(s): INR, PROTIME in the last 168 hours.  No results  for input(s): DDIMER in the last 72 hours.  Cardiac Enzymes  Recent Labs Lab 03/05/17 0932  TROPONINI <0.03   ------------------------------------------------------------------------------------------------------------------    Component Value Date/Time   BNP 199.9 (H) 07/26/2015 1520    Inpatient Medications  Scheduled Meds: . apixaban  2.5 mg Oral BID  . aspirin EC  81 mg Oral Daily  . atorvastatin  80 mg Oral QHS  . calcitRIOL  0.25 mcg Oral Q M,W,F  . carvedilol  12.5 mg Oral BID WC  . docusate sodium  100 mg Oral BID  . famotidine  10 mg Oral BID  . feeding supplement (ENSURE ENLIVE)  237 mL Oral BID BM  . ferrous sulfate  325 mg Oral BID WC  . loratadine  10 mg Oral Daily  . losartan  50 mg Oral Daily  . QUEtiapine  50 mg Oral QHS  . vitamin B-12  100 mcg Oral Daily   Continuous Infusions: . sodium chloride 100 mL/hr at 03/09/17 0925   PRN Meds:.acetaminophen **OR** acetaminophen, alum & mag hydroxide-simeth, diltiazem, haloperidol, HYDROcodone-acetaminophen, ipratropium-albuterol, menthol-cetylpyridinium **OR** phenol, metoCLOPramide **OR** metoCLOPramide (REGLAN) injection, morphine injection, ondansetron **OR** ondansetron (ZOFRAN) IV, polyethylene glycol, senna-docusate  Micro Results No results found for this or any previous visit (from the past 240 hour(s)).  Radiology Reports Dg Chest 1 View  Result Date: 03/05/2017 CLINICAL DATA:  Examination. No current chest complaints. History of CHF, COPD, hypertension, diabetes. EXAM: CHEST 1 VIEW COMPARISON:  PA and lateral chest x-ray of May 12, 2013 FINDINGS: The lungs are well-expanded. There is no focal infiltrate. There is no pleural effusion. The cardiac silhouette is enlarged. The pulmonary vascularity is not engorged. There is calcification in the wall of the thoracic aorta. There is multilevel degenerative disc disease of the thoracic spine. There are degenerative changes of the right shoulder. IMPRESSION:  There is mild enlarged of the cardiac silhouette without significant pulmonary vascular congestion or pulmonary edema. No acute pneumonia. Thoracic aortic atherosclerosis. Electronically Signed   By: David  Martinique M.D.   On: 03/05/2017 09:18   Pelvis Portable  Result Date: 03/08/2017 CLINICAL DATA:  Right hip arthroplasty EXAM: PORTABLE PELVIS 1-2 VIEWS COMPARISON:  None. FINDINGS: Interval right hip arthroplasty without failure or complication. No dislocation. Minimally displaced isolated right greater trochanteric fracture. Postsurgical changes in the surrounding soft tissues. Peripheral vascular atherosclerotic disease. IMPRESSION: Interval right hip arthroplasty without failure or complication. Electronically Signed   By: Kathreen Devoid   On: 03/08/2017 15:15   Dg Hip Unilat With Pelvis 2-3 Views Right  Result Date: 03/05/2017 CLINICAL DATA:  Found on the floor by patient's husband complaining of severe right hip pain with obvious deformity. EXAM: DG HIP (WITH OR WITHOUT PELVIS) 2-3V RIGHT COMPARISON:  No recent studies in [facility] FINDINGS: The patient has sustained an acute subcapital fracture of the right hip. There is mild superior migration of the femoral neck and intertrochanteric region. There appears to be a fracture through the greater trochanter as well. The bony pelvis is intact though subjectively osteopenic. The pubic rami appear intact. There are degenerative changes of the lower lumbar spine. The left hip is grossly normal.  IMPRESSION: The patient has sustained an acute subcapital fracture of the right hip. A fracture extends through the greater trochanter as well. Electronically Signed   By: David  Martinique M.D.   On: 03/05/2017 09:17    Time Spent in minutes  25   Louellen Molder M.D on 03/09/2017 at 11:10 AM  Between 7am to 7pm - Pager - [phone]  After 7pm go to [email or web address] - password [facility]  [facility] -  Office  [phone]

## 2017-03-09 NOTE — Progress Notes (Signed)
CSW discussed d/c w/ Dr. Alvan Dame, plan is for patient to go home w/ spouse and caregivers at discharge w/ extra support if needed.    Kathrin Greathouse, Latanya Presser, MSW Clinical Social Worker 5E and Psychiatric Service Line [phone] 03/09/2017  9:33 AM

## 2017-03-09 NOTE — Evaluation (Signed)
Physical Therapy Evaluation Patient Details Name: [name] MRN: [id number] DOB: [birth year] Today's Date: 03/09/2017   History of Present Illness    [name] is a 83 y.o. female with medical history significant of chronic atrial fibrillation, renal neoplasm, diastolic congestive heart failure chronic, COPD, vascular/mixed dementia with behavioral disturbance, left bundle branch block, hypertension fell at home, sustained femoral neck fracture, ARTHROPLASTY BIPOLAR HIP (HEMIARTHROPLASTY) (Right) on 03/08/17  Clinical Impression  The patient presents with expressive/receptive deficits. Per spouse, patient ambulatory in the house without assistance. Patient has 24/7 caregivers, aide 6 hours /day. The spouse prefers DC home. Pt admitted with above diagnosis. Pt currently with functional limitations due to the deficits listed below (see PT Problem List).  Pt will benefit from skilled PT to increase their independence and safety with mobility to allow discharge to the venue listed below.       Follow Up Recommendations Home health PT;SNF;Supervision/Assistance - 24 hour (spouse wants to take patient home.)    Equipment Recommendations  Rolling walker with 5" wheels;Hospital bed;3in1 (PT) ,    Recommendations for Other Services       Precautions / Restrictions Precautions Precautions: Posterior Hip Restrictions Weight Bearing Restrictions: Yes RLE Weight Bearing: Weight bearing as tolerated      Mobility  Bed Mobility Overal bed mobility: Needs Assistance Bed Mobility: Supine to Sit;Sit to Supine     Supine to sit: Total assist;+2 for physical assistance;+2 for safety/equipment;HOB elevated Sit to supine: Total assist;+2 for physical assistance;+2 for safety/equipment   General bed mobility comments: assist with the legs and trunk for all aspects of bed mobility, sitting up and lying down. the patient is more rigid and resistive.  Transfers Overall transfer level: Needs  assistance Equipment used: Rolling walker (2 wheeled) Transfers: Sit to/from Stand Sit to Stand: Total assist;+2 physical assistance;+2 safety/equipment;From elevated surface         General transfer comment: assisted to stand with 2 assist at RW, Hand o0ver hand to palce hands on RW. Assist to power up, patient pushing posteriorly, feet out in front, does not  get trunk over legs. Stood x 2 trials. , unable to safely pivot to recliner.  Ambulation/Gait                Stairs            Wheelchair Mobility    Modified Rankin (Stroke Patients Only)       Balance Overall balance assessment: Needs assistance;History of Falls Sitting-balance support: Bilateral upper extremity supported;Feet supported Sitting balance-Leahy Scale: Poor   Postural control: Posterior lean Standing balance support: During functional activity;Bilateral upper extremity supported Standing balance-Leahy Scale: Zero                               Pertinent Vitals/Pain Pain Assessment: Faces Faces Pain Scale: Hurts even more Pain Location: right hip Pain Descriptors / Indicators: Discomfort;Grimacing;Guarding;Moaning Pain Intervention(s): Premedicated before session;Monitored during session;Ice applied;Repositioned    Home Living Family/patient expects to be discharged to:: Private residence Living Arrangements: Spouse/significant other Available Help at Discharge: Family Type of Home: House Home Access: Level entry     Home Layout: One [place]: Toilet riser;Grab bars - tub/shower;Walker - 4 wheels      Prior Function Level of Independence: Independent         Comments: with husband providing S     Hand Dominance  Extremity/Trunk Assessment   Upper Extremity Assessment Upper Extremity Assessment: Defer to OT evaluation    Lower Extremity Assessment Lower Extremity Assessment: RLE deficits/detail;LLE deficits/detail RLE Deficits /  Details: patient presents with plantarflexed foot, patient does not move the leg on the bed, decreased knee flexion in sitting. did bear a little weight standing LLE Deficits / Details: knee flexion is more than right, able to bear weight    Cervical / Trunk Assessment Cervical / Trunk Assessment: Kyphotic  Communication   Communication: HOH  Cognition Arousal/Alertness: Awake/alert Behavior During Therapy: Anxious Overall Cognitive Status: History of cognitive impairments - at baseline Area of Impairment: Orientation;Memory;Following commands;Safety/judgement;Awareness;Problem solving                 Orientation Level: Place;Time;Situation     Following Commands: Follows one step commands inconsistently       General Comments: The patient has expressive difficulties. when spouse arrived, the patient brightened up and said" There you are"      General Comments      Exercises     Assessment/Plan    PT Assessment Patient needs continued PT services  PT Problem List Decreased strength;Decreased range of motion;Decreased activity tolerance;Decreased balance;Decreased mobility;Decreased knowledge of precautions;Decreased safety awareness;Decreased knowledge of use of DME;Decreased cognition;Pain       PT Treatment Interventions DME instruction;Gait training;Functional mobility training;Therapeutic activities;Therapeutic exercise;Patient/family education    PT Goals (Current goals can be found in the Care Plan section)  Acute Rehab PT Goals Patient Stated Goal: per spouse, to take patient home PT Goal Formulation: With family Time For Goal Achievement: 03/23/17 Potential to Achieve Goals: Fair    Frequency Min 3X/week   Barriers to discharge        Co-evaluation PT/OT/SLP Co-Evaluation/Treatment: Yes Reason for Co-Treatment: For patient/therapist safety;To address functional/ADL transfers PT goals addressed during session: Mobility/safety with mobility OT goals  addressed during session: ADL's and self-care       AM-PAC PT "6 Clicks" Daily Activity  Outcome Measure Difficulty turning over in bed (including adjusting bedclothes, sheets and blankets)?: Unable Difficulty moving from lying on back to sitting on the side of the bed? : Unable Difficulty sitting down on and standing up from a chair with arms (e.g., wheelchair, bedside commode, etc,.)?: Unable Help needed moving to and from a bed to chair (including a wheelchair)?: Total Help needed walking in hospital room?: Total Help needed climbing 3-5 steps with a railing? : Total 6 Click Score: 6    End of Session Equipment Utilized During Treatment: Gait belt Activity Tolerance: Patient tolerated treatment well Patient left: in bed;with call bell/phone within reach;with bed alarm set;with family/visitor present Nurse Communication: Mobility status PT Visit Diagnosis: Difficulty in walking, not elsewhere classified (R26.2);History of falling (Z91.81)    Time: 4782-9562 PT Time Calculation (min) (ACUTE ONLY): 31 min   Charges:   PT Evaluation $PT Eval Low Complexity: 1 Low     PT G CodesTresa Endo PT [phone]   Claretha Cooper 03/09/2017, 11:06 AM

## 2017-03-10 LAB — CBC
HEMATOCRIT: 29.5 % — AB (ref 36.0–46.0)
HEMOGLOBIN: 9.5 g/dL — AB (ref 12.0–15.0)
MCH: 30.5 pg (ref 26.0–34.0)
MCHC: 32.2 g/dL (ref 30.0–36.0)
MCV: 94.9 fL (ref 78.0–100.0)
Platelets: 138 10*3/uL — ABNORMAL LOW (ref 150–400)
RBC: 3.11 MIL/uL — ABNORMAL LOW (ref 3.87–5.11)
RDW: 13.8 % (ref 11.5–15.5)
WBC: 10.7 10*3/uL — ABNORMAL HIGH (ref 4.0–10.5)

## 2017-03-10 LAB — BASIC METABOLIC PANEL
Anion gap: 10 (ref 5–15)
BUN: 61 mg/dL — AB (ref 6–20)
CHLORIDE: 110 mmol/L (ref 101–111)
CO2: 20 mmol/L — AB (ref 22–32)
CREATININE: 2.1 mg/dL — AB (ref 0.44–1.00)
Calcium: 8.9 mg/dL (ref 8.9–10.3)
GFR calc non Af Amer: 20 mL/min — ABNORMAL LOW (ref >60)
GFR, EST AFRICAN AMERICAN: 23 mL/min — AB (ref >60)
GLUCOSE: 134 mg/dL — AB (ref 65–99)
Potassium: 4.5 mmol/L (ref 3.5–5.1)
Sodium: 140 mmol/L (ref 135–145)

## 2017-03-10 NOTE — Progress Notes (Addendum)
PROGRESS NOTE                                                                                                                                                                                                             Patient Demographics:    [name], is a 83 y.o. female, DOB - May 07, 1928, UXL:[id number]  Admit date - 03/05/2017   Admitting Physician Dron Tanna Furry, MD  Outpatient Primary MD for the patient is Tonette Bihari, MD  LOS - 5  Outpatient Specialists:none  Chief Complaint  Patient presents with  . Fall       Brief Narrative  83 year old female with chronic A. fib on anticoagulation, diastolic CHF, COPD, severe dementia with behavioral disturbance, LBBB, hypertension, hyperlipidemia, renal neoplasm brought to the ED from home after sustaining a mechanical fall and landed on her hip. She was found to be agitated in the ED and unable to provide any history. In the ED x-ray was consistent with right hip fracture. Orthopedics consulted who recommended to hold anticoagulation and plan for surgery on 9/9.    Subjective:   Patient continues to be confused. Still has poor urine output.  Assessment  & Plan :    Active Problems:   Hip fracture, Right, closed, subsequent encounter [facility]) Status post right hemiarthroplasty on 9/9. Tolerated well. Pain control with when necessary Vicodin and low-dose IV morphine. Continue bowel regimen.  PT recommends SNF versus home with supervision (reportedly husband wish to take her home). Continue Foley for urine output monitoring.  Preoperative cardiac clearance. As per gupta perioperative cardiac risk score, patients estimated perioperative including 30 day postoperative risk for myocardial infarction and or cardiac arrest is 1.75%. Continue perioperative beta blocker.    Chronic/persistent atrial fibrillation Eliquis resumed after surgery. Heart rate  improved after increasing Coreg dose.  Acute on Chronic kidney disease stage III ( baseline creatinine 1.4-1.6) Secondary to dehydration with poor urine output. Continue IV fluids. Discontinue losartan.  Postoperative blood loss anemia Stable. Monitor for now.  Leukocytosis Likely stress related. No signs of infection.  Vascular/mixed dementia with agitation Continue when necessary Haldol and Seroquel.Safety sitter as needed.  Essential hypertension Elevated blood pressure due to pain and agitation. Improved after increasing carvedilol dose.  Thrombocytopenia Possibly stress-induced. Improving a.m. Labs.  Prediabetes A1c of 6.2. CBG stable.   Vitamin B  12 deficiency Continue supplemental.  Code Status : DO NOT RESUSCITATE  Family Communication  :Updated husband on the phone  Disposition Plan  : Home with home health and 24 hour supervision tomorrow if renal function improves with hydration. (Husband insists on taking patient home)  Barriers For Discharge : Postop  Consults  :  [place] [facility]  Procedures  : Right hip hemiarthroplasty on 9/9  DVT Prophylaxis  :  ELIQUIS  Lab Results  Component Value Date   PLT 138 (L) 03/10/2017    Antibiotics  :   Anti-infectives    Start     Dose/Rate Route Frequency Ordered Stop   03/08/17 1800  ceFAZolin (ANCEF) IVPB 1 g/50 mL premix     1 g 100 mL/hr over 30 Minutes Intravenous Every 6 hours 03/08/17 1642 03/09/17 0010   03/08/17 1139  ceFAZolin (ANCEF) 2-4 GM/100ML-% IVPB    Comments:  Christell Faith   : cabinet override      03/08/17 1139 03/08/17 2344        Objective:   Vitals:   03/09/17 1827 03/09/17 2058 03/10/17 0030 03/10/17 0540  BP: (!) 113/43 121/69 135/74 (!) 160/77  Pulse: 77 78 80 93  Resp: 18 18 20 20   Temp: 97.8 F (36.6 C) 97.7 F (36.5 C) 98.7 F (37.1 C) 98.4 F (36.9 C)  TempSrc: Oral Oral Axillary Axillary  SpO2: 98% 97%  96%  Weight:      Height:        Wt Readings from Last  3 Encounters:  03/05/17 65.8 kg (145 lb)  03/03/17 65.8 kg (145 lb)  02/24/17 65.2 kg (143 lb 12.8 oz)     Intake/Output Summary (Last 24 hours) at 03/10/17 1054 Last data filed at 03/10/17 0800  Gross per 24 hour  Intake          2476.67 ml  Output              575 ml  Net          1901.67 ml     Physical Exam Gen.: Elderly female very confused, not in distress HEENT: Moist mucosa, supple neck  Chest: Clear bilaterally CVS: S1 and S2 irregular, no murmurs GI: Soft, nondistended, nontender, Foley +,  Musculoskeletal: Clean dressing over her right hip, no edema        Data Review:    CBC  Recent Labs Lab 03/05/17 0932 03/06/17 0526 03/08/17 1244 03/09/17 0533 03/10/17 0528  WBC 16.9* 11.5*  --  10.2 10.7*  HGB 11.2* 10.5* 9.2* 8.7* 9.5*  HCT 35.5* 32.8* 27.0* 26.6* 29.5*  PLT 207 162  --  111* 138*  MCV 97.0 95.3  --  96.0 94.9  MCH 30.6 30.5  --  31.4 30.5  MCHC 31.5 32.0  --  32.7 32.2  RDW 13.9 14.0  --  13.8 13.8  LYMPHSABS 1.1  --   --   --   --   MONOABS 0.7  --   --   --   --   EOSABS 0.1  --   --   --   --   BASOSABS 0.0  --   --   --   --     Chemistries   Recent Labs Lab 03/05/17 0932 03/06/17 0526 03/07/17 0648 03/08/17 1244 03/09/17 0533 03/10/17 0528  NA 141 141 139 140 138 140  K 4.6 5.0 4.5 5.2* 5.2* 4.5  CL 109 108 107  --  107 110  CO2 23 25 23   --  22 20*  GLUCOSE 144* 142* 153* 120* 182* 134*  BUN 29* 31* 32*  --  53* 61*  CREATININE 1.34* 1.43* 1.42*  --  2.00* 2.10*  CALCIUM 9.5 9.1 9.4  --  8.5* 8.9   ------------------------------------------------------------------------------------------------------------------ No results for input(s): CHOL, HDL, LDLCALC, TRIG, CHOLHDL, LDLDIRECT in the last 72 hours.  Lab Results  Component Value Date   HGBA1C 6.2 01/01/2017   ------------------------------------------------------------------------------------------------------------------ No results for input(s): TSH, T4TOTAL,  T3FREE, THYROIDAB in the last 72 hours.  Invalid input(s): FREET3 ------------------------------------------------------------------------------------------------------------------ No results for input(s): VITAMINB12, FOLATE, FERRITIN, TIBC, IRON, RETICCTPCT in the last 72 hours.  Coagulation profile No results for input(s): INR, PROTIME in the last 168 hours.  No results for input(s): DDIMER in the last 72 hours.  Cardiac Enzymes  Recent Labs Lab 03/05/17 0932  TROPONINI <0.03   ------------------------------------------------------------------------------------------------------------------    Component Value Date/Time   BNP 199.9 (H) 07/26/2015 1520    Inpatient Medications  Scheduled Meds: . apixaban  2.5 mg Oral BID  . aspirin EC  81 mg Oral Daily  . atorvastatin  80 mg Oral QHS  . calcitRIOL  0.25 mcg Oral Q M,W,F  . carvedilol  12.5 mg Oral BID WC  . docusate sodium  100 mg Oral BID  . famotidine  10 mg Oral BID  . feeding supplement (ENSURE ENLIVE)  237 mL Oral BID BM  . ferrous sulfate  325 mg Oral BID WC  . loratadine  10 mg Oral Daily  . QUEtiapine  50 mg Oral QHS  . vitamin B-12  100 mcg Oral Daily   Continuous Infusions: . sodium chloride 100 mL/hr at 03/10/17 0635   PRN Meds:.acetaminophen **OR** acetaminophen, alum & mag hydroxide-simeth, diltiazem, haloperidol, HYDROcodone-acetaminophen, ipratropium-albuterol, menthol-cetylpyridinium **OR** phenol, metoCLOPramide **OR** metoCLOPramide (REGLAN) injection, morphine injection, ondansetron **OR** ondansetron (ZOFRAN) IV, polyethylene glycol, senna-docusate  Micro Results No results found for this or any previous visit (from the past 240 hour(s)).  Radiology Reports Dg Chest 1 View  Result Date: 03/05/2017 CLINICAL DATA:  Examination. No current chest complaints. History of CHF, COPD, hypertension, diabetes. EXAM: CHEST 1 VIEW COMPARISON:  PA and lateral chest x-ray of May 12, 2013 FINDINGS: The lungs  are well-expanded. There is no focal infiltrate. There is no pleural effusion. The cardiac silhouette is enlarged. The pulmonary vascularity is not engorged. There is calcification in the wall of the thoracic aorta. There is multilevel degenerative disc disease of the thoracic spine. There are degenerative changes of the right shoulder. IMPRESSION: There is mild enlarged of the cardiac silhouette without significant pulmonary vascular congestion or pulmonary edema. No acute pneumonia. Thoracic aortic atherosclerosis. Electronically Signed   By: David  Martinique M.D.   On: 03/05/2017 09:18   Pelvis Portable  Result Date: 03/08/2017 CLINICAL DATA:  Right hip arthroplasty EXAM: PORTABLE PELVIS 1-2 VIEWS COMPARISON:  None. FINDINGS: Interval right hip arthroplasty without failure or complication. No dislocation. Minimally displaced isolated right greater trochanteric fracture. Postsurgical changes in the surrounding soft tissues. Peripheral vascular atherosclerotic disease. IMPRESSION: Interval right hip arthroplasty without failure or complication. Electronically Signed   By: Kathreen Devoid   On: 03/08/2017 15:15   Dg Hip Unilat With Pelvis 2-3 Views Right  Result Date: 03/05/2017 CLINICAL DATA:  Found on the floor by patient's husband complaining of severe right hip pain with obvious deformity. EXAM: DG HIP (WITH OR WITHOUT PELVIS) 2-3V RIGHT COMPARISON:  No recent studies in [facility] FINDINGS: The patient has  sustained an acute subcapital fracture of the right hip. There is mild superior migration of the femoral neck and intertrochanteric region. There appears to be a fracture through the greater trochanter as well. The bony pelvis is intact though subjectively osteopenic. The pubic rami appear intact. There are degenerative changes of the lower lumbar spine. The left hip is grossly normal. IMPRESSION: The patient has sustained an acute subcapital fracture of the right hip. A fracture extends through the greater  trochanter as well. Electronically Signed   By: David  Martinique M.D.   On: 03/05/2017 09:17    Time Spent in minutes  25   Louellen Molder M.D on 03/10/2017 at 10:54 AM  Between 7am to 7pm - Pager - [phone]  After 7pm go to [email or web address] - password [facility]  [facility] -  Office  [phone]

## 2017-03-10 NOTE — Care Management Important Message (Signed)
Important Message  Patient Details  Name: AUSTYNN PRIDMORE MRN: [id number] Date of Birth: Sep 18, 1927   Medicare Important Message Given:  Yes    Kerin Salen 03/10/2017, 10:56 AMImportant Message  Patient Details  Name: ADDA STOKES MRN: [id number] Date of Birth: February 01, 1928   Medicare Important Message Given:  Yes    Kerin Salen 03/10/2017, 10:56 AM

## 2017-03-10 NOTE — Progress Notes (Signed)
Physical Therapy Treatment Patient Details Name: [name] MRN: [id number] DOB: 10/04/1927 Today's Date: 03/10/2017    History of Present Illness   [name] is a 83 y.o. female with medical history significant of chronic atrial fibrillation, renal neoplasm, diastolic congestive heart failure chronic, COPD, vascular/mixed dementia with behavioral disturbance, left bundle branch block, hypertension fell at home, sustained femoral neck fracture, ARTHROPLASTY BIPOLAR HIP (HEMIARTHROPLASTY) (Right) on 03/08/17    PT Comments    The patient  Demonstrated slight  Improved sitting balance. Continues to require 2 assist. Spouse not present today to discuss DC plans.   Follow Up Recommendations  Home health PT;SNF;Supervision/Assistance - 24 hour     Equipment Recommendations  Rolling walker with 5" wheels;Hospital bed;3in1 (PT)    Recommendations for Other Services       Precautions / Restrictions Precautions Precautions: Posterior Hip Restrictions Weight Bearing Restrictions: Yes RLE Weight Bearing: Weight bearing as tolerated    Mobility  Bed Mobility Overal bed mobility: Needs Assistance Bed Mobility: Supine to Sit;Sit to Supine     Supine to sit: Total assist;+2 for physical assistance;+2 for safety/equipment;HOB elevated Sit to supine: Total assist;+2 for physical assistance;+2 for safety/equipment   General bed mobility comments: assist with the legs and trunk for all aspects of bed mobility, sitting up and lying down. the patient is more rigid and resistive.  Transfers Overall transfer level: Needs assistance Equipment used: Rolling walker (2 wheeled) Transfers: Sit to/from Stand Sit to Stand: Max assist;+2 physical assistance;+2 safety/equipment         General transfer comment: The  patient was able to assist   standing from bed, leaning backward, feet sliding forward. Practiced x 4 trials  Ambulation/Gait                 Stairs             Wheelchair Mobility    Modified Rankin (Stroke Patients Only)       Balance   Sitting-balance support: Bilateral upper extremity supported;Feet supported Sitting balance-Leahy Scale: Fair Sitting balance - Comments: patient demonstrated improved sitting balance today. Not leaning backwards. Postural control: Posterior lean Standing balance support: During functional activity;Bilateral upper extremity supported                                Cognition Arousal/Alertness: Awake/alert Behavior During Therapy: Anxious Overall Cognitive Status: History of cognitive impairments - at baseline                                 General Comments: The patient has expressive difficulties.       Exercises      General Comments        Pertinent Vitals/Pain Pain Assessment: Faces Faces Pain Scale: Hurts even more Pain Location: right hip Pain Descriptors / Indicators: Discomfort;Grimacing;Guarding;Moaning Pain Intervention(s): Monitored during session;Repositioned    Home Living                      Prior Function            PT Goals (current goals can now be found in the care plan section) Progress towards PT goals: Progressing toward goals    Frequency    Min 3X/week      PT Plan Current plan remains appropriate    Co-evaluation  AM-PAC PT "6 Clicks" Daily Activity  Outcome Measure  Difficulty turning over in bed (including adjusting bedclothes, sheets and blankets)?: Unable Difficulty moving from lying on back to sitting on the side of the bed? : Unable Difficulty sitting down on and standing up from a chair with arms (e.g., wheelchair, bedside commode, etc,.)?: Unable Help needed moving to and from a bed to chair (including a wheelchair)?: Total Help needed walking in hospital room?: Total Help needed climbing 3-5 steps with a railing? : Total 6 Click Score: 6    End of Session Equipment Utilized  During Treatment: Gait belt Activity Tolerance: Patient tolerated treatment well Patient left: in bed;with call bell/phone within reach;with bed alarm set Nurse Communication: Mobility status PT Visit Diagnosis: Difficulty in walking, not elsewhere classified (R26.2);History of falling (Z91.81)     Time: 7948-0165 PT Time Calculation (min) (ACUTE ONLY): 16 min  Charges:  $Therapeutic Activity: 8-22 mins                    G CodesTresa Endo PT [phone]    Claretha Cooper 03/10/2017, 1:03 PM

## 2017-03-11 DIAGNOSIS — S72011A Unspecified intracapsular fracture of right femur, initial encounter for closed fracture: Principal | ICD-10-CM

## 2017-03-11 LAB — BASIC METABOLIC PANEL
ANION GAP: 5 (ref 5–15)
BUN: 53 mg/dL — ABNORMAL HIGH (ref 6–20)
CALCIUM: 8.5 mg/dL — AB (ref 8.9–10.3)
CO2: 21 mmol/L — ABNORMAL LOW (ref 22–32)
CREATININE: 1.51 mg/dL — AB (ref 0.44–1.00)
Chloride: 114 mmol/L — ABNORMAL HIGH (ref 101–111)
GFR, EST AFRICAN AMERICAN: 35 mL/min — AB (ref >60)
GFR, EST NON AFRICAN AMERICAN: 30 mL/min — AB (ref >60)
GLUCOSE: 115 mg/dL — AB (ref 65–99)
Potassium: 4.6 mmol/L (ref 3.5–5.1)
Sodium: 140 mmol/L (ref 135–145)

## 2017-03-11 LAB — CBC
HCT: 26.2 % — ABNORMAL LOW (ref 36.0–46.0)
HEMOGLOBIN: 8.4 g/dL — AB (ref 12.0–15.0)
MCH: 30.8 pg (ref 26.0–34.0)
MCHC: 32.1 g/dL (ref 30.0–36.0)
MCV: 96 fL (ref 78.0–100.0)
PLATELETS: 142 10*3/uL — AB (ref 150–400)
RBC: 2.73 MIL/uL — ABNORMAL LOW (ref 3.87–5.11)
RDW: 14 % (ref 11.5–15.5)
WBC: 7.9 10*3/uL (ref 4.0–10.5)

## 2017-03-11 NOTE — Clinical Social Work Placement (Signed)
   CLINICAL SOCIAL WORK PLACEMENT  NOTE  Date:  03/11/2017  Patient Details  Name: [name] MRN: [id number] Date of Birth: 22-Aug-1927  Clinical Social Work is seeking post-discharge placement for this patient at the [place] level of care (*CSW will initial, date and re-position this form in  chart as items are completed):  Yes   Patient/family provided with [facility] Work Department's list of facilities offering this level of care within the geographic area requested by the patient (or if unable, by the patient's family).  Yes   Patient/family informed of their freedom to choose among providers that offer the needed level of care, that participate in Medicare, Medicaid or managed care program needed by the patient, have an available bed and are willing to accept the patient.  Yes   Patient/family informed of [facility]'s ownership interest in [facility] and [facility], as well as of the fact that they are under no obligation to receive care at these facilities.  PASRR submitted to EDS on 03/04/17     PASRR number received on       Existing PASRR number confirmed on       FL2 transmitted to all facilities in geographic area requested by pt/family on 03/11/17     FL2 transmitted to all facilities within larger geographic area on       Patient informed that his/her managed care company has contracts with or will negotiate with certain facilities, including the following:        Yes   Patient/family informed of bed offers received.  Patient chooses bed at [facility]     Physician recommends and patient chooses bed at      Patient to be transferred to [facility] on  .  Patient to be transferred to facility by       Patient family notified on   of transfer.  Name of family member notified:        PHYSICIAN       Additional Comment:    _______________________________________________ [name], [place] 03/11/2017, 4:00 PM

## 2017-03-11 NOTE — Progress Notes (Signed)
Occupational Therapy Treatment Patient Details Name: [name] MRN: [id number] DOB: Nov 21, 1927 Today's Date: 03/11/2017    History of present illness   [name] is a 83 y.o. female with medical history significant of chronic atrial fibrillation, renal neoplasm, diastolic congestive heart failure chronic, COPD, vascular/mixed dementia with behavioral disturbance, left bundle branch block, hypertension fell at home, sustained femoral neck fracture, ARTHROPLASTY BIPOLAR HIP (HEMIARTHROPLASTY) (Right) on 03/08/17   OT comments  pts husband agreeable to SNF- RN notified  Follow Up Recommendations  SNF    Equipment Recommendations  Other (comment)    Recommendations for Other Services      Precautions / Restrictions Restrictions Weight Bearing Restrictions: Yes RLE Weight Bearing: Weight bearing as tolerated       Mobility Bed Mobility Overal bed mobility: Needs Assistance Bed Mobility: Supine to Sit     Supine to sit: HOB elevated;Max assist Sit to supine: Max assist      Transfers Overall transfer level: Needs assistance Equipment used: Rolling walker (2 wheeled) Transfers: Sit to/from [facility] Sit to Stand: Max assist;+2 physical assistance;+2 safety/equipment Stand pivot transfers: Max assist;+2 safety/equipment;+2 physical assistance       General transfer comment: Pt did transition to bed with significant A. Husband and pt agreeable to rehab as husband did A with this transfer and realizes how much A will be needed        ADL either performed or assessed with clinical judgement   ADL                                          General ADL Comments: Pt has improved this OT visit. Pt did sit EOB and then transition to chair with significant A     Vision Patient Visual Report: No change from baseline            Cognition Arousal/Alertness: Awake/alert Behavior During Therapy: Anxious Overall Cognitive Status:  History of cognitive impairments - at baseline                                 General Comments: The patient has expressive difficulties.                    Pertinent Vitals/ Pain       Pain Assessment: Faces Pain Score: 2  Pain Location: right hip Pain Intervention(s): Limited activity within patient's tolerance;Repositioned         Frequency  Min 2X/week        Progress Toward Goals  OT Goals(current goals can now be found in the care plan section)     Acute Rehab OT Goals Patient Stated Goal: per spouse, to take patient home OT Goal Formulation: With patient Time For Goal Achievement: 03/23/17 Potential to Achieve Goals: Good  Plan Discharge plan remains appropriate    Co-evaluation                 AM-PAC PT "6 Clicks" Daily Activity     Outcome Measure   Help from another person eating meals?: A Lot Help from another person taking care of personal grooming?: A Lot Help from another person toileting, which includes using toliet, bedpan, or urinal?: Total Help from another person bathing (including washing, rinsing, drying)?: Total Help from another person to put on and  taking off regular upper body clothing?: Total Help from another person to put on and taking off regular lower body clothing?: Total 6 Click Score: 8    End of Session Equipment Utilized During Treatment: Rolling walker;Gait belt  OT Visit Diagnosis: Unsteadiness on feet (R26.81);Pain;Muscle weakness (generalized) (M62.81)   Activity Tolerance Patient limited by pain   Patient Left in bed;with call bell/phone within reach;with bed alarm set;with family/visitor present   Nurse Communication Mobility status        Time: 1287-8676 OT Time Calculation (min): 29 min  Charges: OT General Charges $OT Visit: 1 Visit OT Treatments $Self Care/Home Management : 23-37 mins  [place], [place]   Betsy Pries 03/11/2017, 2:40 PM

## 2017-03-11 NOTE — Progress Notes (Signed)
CSW consulted to assist with SNF placement. PT has recommended SNF at dc but spouse was planning for dc home. Spouse now realizes pt needs more care than is available at home. CSW met with pt/spouse/family members at bedside to assist with SNF placement. Pt has dementia and is unable to participate in dc planning. All family members are in agreement with [facility] placement. SNF search has been initiated. Bed offers provided and family has chosen [facility] for placement. CSW has contacted SNF and dc plan confirmed. Pasrr # is pending. CSW will continue to follow to assist with dc planning needs,  Werner Lean LCSW [phone]

## 2017-03-11 NOTE — [place] FL2 (Signed)
[place] MEDICAID FL2 LEVEL OF CARE SCREENING TOOL     IDENTIFICATION  Patient Name: [name] Birthdate: 1927-07-02 Sex: female Admission Date (Current Location): 03/05/2017  [facility] and [place] Number:  Herbalist and Address:  [facility],  [place] [address] [place]      Provider Number: [id number]  Attending Physician Name and Address:  Velvet Bathe, MD  Relative Name and Phone Number:       Current Level of Care: Hospital Recommended Level of Care: [place] Prior Approval Number:    Date Approved/Denied:   PASRR Number:  [id number] Discharge Plan: SNF    Current Diagnoses: Patient Active Problem List   Diagnosis Date Noted  . Hip fracture, unspecified laterality, closed, initial encounter ([place]) 03/05/2017  . Hip fracture ([place]) 03/05/2017  . Closed subcapital fracture of right femur ([place])   . Vascular dementia with behavior disturbance   . Normocytic anemia 02/23/2017  . High risk medication use 02/23/2017  . Altered mental status 02/23/2017  . Hearing impairment 12/26/2016  . Impaired mobility and ADLs 12/26/2016  . Urinary incontinence in female 12/26/2016  . Cerebellar infarct ([place]) 12/26/2016  . Falls frequently 12/26/2016  . Borderline Low serum vitamin B12 12/26/2016  . Visual impairment in both eyes   . Ovarian cyst 10/28/2016  . DNR (do not resuscitate) 08/22/2016  . Skin lesion of neck 04/18/2016  . Dementia, vascular, mixed, with behavioral disturbance 01/31/2016  . Hematochezia 12/24/2015  . COPD exacerbation ([place]) 09/12/2015  . Constipation 11/27/2014  . Osteoporosis 09/11/2014  . Loss of weight 04/30/2013  . Secondary hyperparathyroidism ([place]) 11/10/2011  . History of anemia 11/10/2011  . [place]-induced atrial fibrillation 02/06/2011  . CKD stage G3b/A2, GFR 30-44 and albumin creatinine ratio 30-299 mg/g 01/26/2007  . Type 2 diabetes mellitus ([place]) 08/27/2006  . HLD (hyperlipidemia)  08/27/2006  . Essential hypertension 08/27/2006  . OSTEOARTHRITIS, MULTI SITES 08/27/2006    Orientation RESPIRATION BLADDER Height & Weight     Self  Normal Indwelling catheter Weight: 65.8 kg (145 lb) Height:  5\' 7"  (170.2 cm)  BEHAVIORAL SYMPTOMS/MOOD NEUROLOGICAL BOWEL NUTRITION STATUS  Other (Comment) (Pt with dementia can be uncooperative with care at times.  Calls out at times.  Less anxious when spouse is present.)   Continent Diet  AMBULATORY STATUS COMMUNICATION OF NEEDS Skin   Extensive Assist Verbally Surgical wounds                       Personal Care Assistance Level of Assistance  Bathing, Feeding, Dressing Bathing Assistance: Maximum assistance Feeding assistance: Limited assistance Dressing Assistance: Maximum assistance     Functional Limitations Info  Sight, Hearing, Speech Sight Info: Adequate Hearing Info: Impaired Speech Info: Adequate    SPECIAL CARE FACTORS FREQUENCY  PT (By licensed PT), OT (By licensed OT)     PT Frequency: 5x wk OT Frequency: 5x wk            Contractures Contractures Info: Not present    Additional Factors Info  Code Status Code Status Info: DNR Allergies Info: NKA           Current Medications (03/11/2017):  This is the current hospital active medication list Current Facility-Administered Medications  Medication Dose Route Frequency Provider Last Rate Last Dose  . 0.9 %  sodium chloride infusion   Intravenous Continuous Dhungel, Nishant, MD 100 mL/hr at 03/11/17 0543    . acetaminophen (TYLENOL) tablet 650 mg  650 mg Oral Q6H PRN Paralee Cancel, MD       Or  . acetaminophen (TYLENOL) suppository 650 mg  650 mg Rectal Q6H PRN Paralee Cancel, MD      . alum & mag hydroxide-simeth (MAALOX/MYLANTA) 200-200-20 MG/5ML suspension 30 mL  30 mL Oral Q4H PRN Paralee Cancel, MD      . apixaban Arne Cleveland) tablet 2.5 mg  2.5 mg Oral BID Danae Orleans, PA-C   2.5 mg at 03/11/17 0926  . aspirin EC tablet 81 mg  81 mg Oral  Daily Danae Orleans, PA-C   81 mg at 03/11/17 1517  . atorvastatin (LIPITOR) tablet 80 mg  80 mg Oral QHS Rosita Fire, MD   80 mg at 03/08/17 2104  . calcitRIOL (ROCALTROL) capsule 0.25 mcg  0.25 mcg Oral Q M,W,F Rosita Fire, MD   0.25 mcg at 03/11/17 6160  . carvedilol (COREG) tablet 12.5 mg  12.5 mg Oral BID WC Dhungel, Nishant, MD   12.5 mg at 03/11/17 0926  . diltiazem (CARDIZEM) injection 10 mg  10 mg Intravenous Q6H PRN Dhungel, Nishant, MD      . docusate sodium (COLACE) capsule 100 mg  100 mg Oral BID Paralee Cancel, MD   100 mg at 03/11/17 0926  . famotidine (PEPCID) tablet 10 mg  10 mg Oral BID Rosita Fire, MD   10 mg at 03/11/17 0926  . feeding supplement (ENSURE ENLIVE) (ENSURE ENLIVE) liquid 237 mL  237 mL Oral BID BM Dhungel, Nishant, MD   237 mL at 03/11/17 0927  . ferrous sulfate tablet 325 mg  325 mg Oral BID WC Paralee Cancel, MD   325 mg at 03/11/17 7371  . haloperidol (HALDOL) tablet 0.5 mg  0.5 mg Oral TID PRN Rosita Fire, MD      . HYDROcodone-acetaminophen (NORCO/VICODIN) 5-325 MG per tablet 1-2 tablet  1-2 tablet Oral Q6H PRN Paralee Cancel, MD   1 tablet at 03/10/17 1620  . ipratropium-albuterol (DUONEB) 0.5-2.5 (3) MG/3ML nebulizer solution 3 mL  3 mL Nebulization Q4H PRN Rosita Fire, MD      . loratadine (CLARITIN) tablet 10 mg  10 mg Oral Daily Rosita Fire, MD   10 mg at 03/11/17 0926  . menthol-cetylpyridinium (CEPACOL) lozenge 3 mg  1 lozenge Oral PRN Paralee Cancel, MD       Or  . phenol (CHLORASEPTIC) mouth spray 1 spray  1 spray Mouth/Throat PRN Paralee Cancel, MD      . metoCLOPramide (REGLAN) tablet 5-10 mg  5-10 mg Oral Q8H PRN Paralee Cancel, MD       Or  . metoCLOPramide (REGLAN) injection 5-10 mg  5-10 mg Intravenous Q8H PRN Paralee Cancel, MD      . morphine 2 MG/ML injection 0.5 mg  0.5 mg Intravenous Q2H PRN Rosita Fire, MD   0.5 mg at 03/11/17 1132  . ondansetron (ZOFRAN) tablet 4 mg  4 mg  Oral Q6H PRN Paralee Cancel, MD       Or  . ondansetron [facility]) injection 4 mg  4 mg Intravenous Q6H PRN Paralee Cancel, MD      . polyethylene glycol (MIRALAX / GLYCOLAX) packet 17 g  17 g Oral Daily PRN Paralee Cancel, MD      . QUEtiapine (SEROQUEL) tablet 50 mg  50 mg Oral QHS Rosita Fire, MD   50 mg at 03/08/17 2104  . senna-docusate (Senokot-S) tablet 1 tablet  1 tablet Oral BID PRN Carolin Sicks,  Osie Bond, MD      . vitamin B-12 (CYANOCOBALAMIN) tablet 100 mcg  100 mcg Oral Daily Rosita Fire, MD   100 mcg at 03/11/17 0240     Discharge Medications: Please see discharge summary for a list of discharge medications.  Relevant Imaging Results:  Relevant Lab Results:   Additional Information SS # [id number]  Yevette Knust, Randall An, LCSW

## 2017-03-11 NOTE — Progress Notes (Signed)
PROGRESS NOTE                                                                                                                                                                                                             Patient Demographics:    [name], is a 83 y.o. female, DOB - 1927/12/29, KGM:[id number]  Admit date - 03/05/2017   Admitting Physician Dron Tanna Furry, MD  Outpatient Primary MD for the patient is Tonette Bihari, MD  LOS - 6  Outpatient Specialists:none  Chief Complaint  Patient presents with  . Fall       Brief Narrative  83 year old female with chronic A. fib on anticoagulation, diastolic CHF, COPD, severe dementia with behavioral disturbance, LBBB, hypertension, hyperlipidemia, renal neoplasm brought to the ED from home after sustaining a mechanical fall and landed on her hip. She was found to be agitated in the ED and unable to provide any history. In the ED x-ray was consistent with right hip fracture. Orthopedics consulted who recommended to hold anticoagulation and plan for surgery on 9/9.    Subjective:   Pt is pleasantly confused. Husband states he will not be able to care for his wife at home by himself.   Assessment  & Plan :    Active Problems:   Hip fracture, Right, closed, subsequent encounter [facility]) Status post right hemiarthroplasty on 9/9. Tolerated well. Pain control with when necessary Vicodin and low-dose IV morphine. Continue bowel regimen.  PT recommends SNF versus home with supervision (reportedly husband wish to take her home). Continue Foley for urine output monitoring.  Preoperative cardiac clearance. As per gupta perioperative cardiac risk score, patients estimated perioperative including 30 day postoperative risk for myocardial infarction and or cardiac arrest is 1.75%. - pt is s/p operation and no report of chest pain.  Chronic/persistent atrial  fibrillation Eliquis resumed after surgery.  - Stable on Coreg  Acute on Chronic kidney disease stage III ( baseline creatinine 1.4-1.6) Secondary to dehydration with poor urine output. Continue IV fluids. Discontinue losartan.  Postoperative blood loss anemia Stable. Monitor for now.  Leukocytosis Resolved. No signs of infection.  Vascular/mixed dementia with agitation Continue when necessary Haldol and Seroquel. Safety sitter as needed.  Essential hypertension Elevated blood pressure due to pain and agitation. Improved after increasing carvedilol dose.  Thrombocytopenia Possibly stress-induced. Improving  a.m. Labs.  Prediabetes A1c of 6.2. CBG stable.   Vitamin B 12 deficiency Continue supplemental.  Code Status : DO NOT RESUSCITATE  Family Communication : Updated husband on the phone  Disposition Plan  : SNF  Barriers For Discharge : Postop  Consults  :  [facility]  Procedures  : Right hip hemiarthroplasty on 9/9  DVT Prophylaxis  :  ELIQUIS  Lab Results  Component Value Date   PLT 142 (L) 03/11/2017    Antibiotics  :   Anti-infectives    Start     Dose/Rate Route Frequency Ordered Stop   03/08/17 1800  ceFAZolin (ANCEF) IVPB 1 g/50 mL premix     1 g 100 mL/hr over 30 Minutes Intravenous Every 6 hours 03/08/17 1642 03/09/17 0010   03/08/17 1139  ceFAZolin (ANCEF) 2-4 GM/100ML-% IVPB    Comments:  Christell Faith   : cabinet override      03/08/17 1139 03/08/17 2344        Objective:   Vitals:   03/10/17 1443 03/10/17 2144 03/11/17 0523 03/11/17 1423  BP: (!) 136/55 (!) 147/61 (!) 167/74 (!) 144/66  Pulse: 79 75 87   Resp: 20 20 20 19   Temp: 99.3 F (37.4 C) 98.1 F (36.7 C) 98.3 F (36.8 C) 98.6 F (37 C)  TempSrc: Axillary Axillary Axillary Oral  SpO2: 98% 98% 98% 99%  Weight:      Height:        Wt Readings from Last 3 Encounters:  03/05/17 65.8 kg (145 lb)  03/03/17 65.8 kg (145 lb)  02/24/17 65.2 kg (143 lb 12.8 oz)      Intake/Output Summary (Last 24 hours) at 03/11/17 1617 Last data filed at 03/11/17 0543  Gross per 24 hour  Intake          2313.33 ml  Output              800 ml  Net          1513.33 ml     Physical Exam Gen.: pleasantly confuse, in nad, alert and awake HEENT: Moist mucosa, supple neck  Chest: Clear bilaterally, no wheezes, equal chest rise. CVS: S1 and S2 irregular, no murmurs GI: Soft, nondistended, nontender, Foley +,  Musculoskeletal: Clean dressing over her right hip, no edema      Data Review:    CBC  Recent Labs Lab 03/05/17 0932 03/06/17 0526 03/08/17 1244 03/09/17 0533 03/10/17 0528 03/11/17 0618  WBC 16.9* 11.5*  --  10.2 10.7* 7.9  HGB 11.2* 10.5* 9.2* 8.7* 9.5* 8.4*  HCT 35.5* 32.8* 27.0* 26.6* 29.5* 26.2*  PLT 207 162  --  111* 138* 142*  MCV 97.0 95.3  --  96.0 94.9 96.0  MCH 30.6 30.5  --  31.4 30.5 30.8  MCHC 31.5 32.0  --  32.7 32.2 32.1  RDW 13.9 14.0  --  13.8 13.8 14.0  LYMPHSABS 1.1  --   --   --   --   --   MONOABS 0.7  --   --   --   --   --   EOSABS 0.1  --   --   --   --   --   BASOSABS 0.0  --   --   --   --   --     Chemistries   Recent Labs Lab 03/06/17 0526 03/07/17 0648 03/08/17 1244 03/09/17 0533 03/10/17 0528 03/11/17 0618  NA 141 139 140 138 140  140  K 5.0 4.5 5.2* 5.2* 4.5 4.6  CL 108 107  --  107 110 114*  CO2 25 23  --  22 20* 21*  GLUCOSE 142* 153* 120* 182* 134* 115*  BUN 31* 32*  --  53* 61* 53*  CREATININE 1.43* 1.42*  --  2.00* 2.10* 1.51*  CALCIUM 9.1 9.4  --  8.5* 8.9 8.5*   ------------------------------------------------------------------------------------------------------------------ No results for input(s): CHOL, HDL, LDLCALC, TRIG, CHOLHDL, LDLDIRECT in the last 72 hours.  Lab Results  Component Value Date   HGBA1C 6.2 01/01/2017   ------------------------------------------------------------------------------------------------------------------ No results for input(s): TSH, T4TOTAL,  T3FREE, THYROIDAB in the last 72 hours.  Invalid input(s): FREET3 ------------------------------------------------------------------------------------------------------------------ No results for input(s): VITAMINB12, FOLATE, FERRITIN, TIBC, IRON, RETICCTPCT in the last 72 hours.  Coagulation profile No results for input(s): INR, PROTIME in the last 168 hours.  No results for input(s): DDIMER in the last 72 hours.  Cardiac Enzymes  Recent Labs Lab 03/05/17 0932  TROPONINI <0.03   ------------------------------------------------------------------------------------------------------------------    Component Value Date/Time   BNP 199.9 (H) 07/26/2015 1520    Inpatient Medications  Scheduled Meds: . apixaban  2.5 mg Oral BID  . aspirin EC  81 mg Oral Daily  . atorvastatin  80 mg Oral QHS  . calcitRIOL  0.25 mcg Oral Q M,W,F  . carvedilol  12.5 mg Oral BID WC  . docusate sodium  100 mg Oral BID  . famotidine  10 mg Oral BID  . feeding supplement (ENSURE ENLIVE)  237 mL Oral BID BM  . ferrous sulfate  325 mg Oral BID WC  . loratadine  10 mg Oral Daily  . QUEtiapine  50 mg Oral QHS  . vitamin B-12  100 mcg Oral Daily   Continuous Infusions: . sodium chloride 100 mL/hr at 03/11/17 0543   PRN Meds:.acetaminophen **OR** acetaminophen, alum & mag hydroxide-simeth, diltiazem, haloperidol, HYDROcodone-acetaminophen, ipratropium-albuterol, menthol-cetylpyridinium **OR** phenol, metoCLOPramide **OR** metoCLOPramide (REGLAN) injection, morphine injection, ondansetron **OR** ondansetron (ZOFRAN) IV, polyethylene glycol, senna-docusate  Micro Results No results found for this or any previous visit (from the past 240 hour(s)).  Radiology Reports Dg Chest 1 View  Result Date: 03/05/2017 CLINICAL DATA:  Examination. No current chest complaints. History of CHF, COPD, hypertension, diabetes. EXAM: CHEST 1 VIEW COMPARISON:  PA and lateral chest x-ray of May 12, 2013 FINDINGS: The lungs  are well-expanded. There is no focal infiltrate. There is no pleural effusion. The cardiac silhouette is enlarged. The pulmonary vascularity is not engorged. There is calcification in the wall of the thoracic aorta. There is multilevel degenerative disc disease of the thoracic spine. There are degenerative changes of the right shoulder. IMPRESSION: There is mild enlarged of the cardiac silhouette without significant pulmonary vascular congestion or pulmonary edema. No acute pneumonia. Thoracic aortic atherosclerosis. Electronically Signed   By: David  Martinique M.D.   On: 03/05/2017 09:18   Pelvis Portable  Result Date: 03/08/2017 CLINICAL DATA:  Right hip arthroplasty EXAM: PORTABLE PELVIS 1-2 VIEWS COMPARISON:  None. FINDINGS: Interval right hip arthroplasty without failure or complication. No dislocation. Minimally displaced isolated right greater trochanteric fracture. Postsurgical changes in the surrounding soft tissues. Peripheral vascular atherosclerotic disease. IMPRESSION: Interval right hip arthroplasty without failure or complication. Electronically Signed   By: Kathreen Devoid   On: 03/08/2017 15:15   Dg Hip Unilat With Pelvis 2-3 Views Right  Result Date: 03/05/2017 CLINICAL DATA:  Found on the floor by patient's husband complaining of severe right hip pain with obvious deformity.  EXAM: DG HIP (WITH OR WITHOUT PELVIS) 2-3V RIGHT COMPARISON:  No recent studies in [facility] FINDINGS: The patient has sustained an acute subcapital fracture of the right hip. There is mild superior migration of the femoral neck and intertrochanteric region. There appears to be a fracture through the greater trochanter as well. The bony pelvis is intact though subjectively osteopenic. The pubic rami appear intact. There are degenerative changes of the lower lumbar spine. The left hip is grossly normal. IMPRESSION: The patient has sustained an acute subcapital fracture of the right hip. A fracture extends through the greater  trochanter as well. Electronically Signed   By: David  Martinique M.D.   On: 03/05/2017 09:17    Time Spent in minutes  30 minutes   Velvet Bathe M.D on 03/11/2017 at 4:17 PM  Between 7am to 7pm - Pager - [phone]  After 7pm go to [email or web address] - password [facility]  [facility] -  Office  [phone]

## 2017-03-12 DIAGNOSIS — Z4781 Encounter for orthopedic aftercare following surgical amputation: Secondary | ICD-10-CM | POA: Diagnosis not present

## 2017-03-12 DIAGNOSIS — S72011A Unspecified intracapsular fracture of right femur, initial encounter for closed fracture: Secondary | ICD-10-CM | POA: Diagnosis not present

## 2017-03-12 DIAGNOSIS — M80851D Other osteoporosis with current pathological fracture, right femur, subsequent encounter for fracture with routine healing: Secondary | ICD-10-CM | POA: Diagnosis not present

## 2017-03-12 DIAGNOSIS — S92153A Displaced avulsion fracture (chip fracture) of unspecified talus, initial encounter for closed fracture: Secondary | ICD-10-CM | POA: Diagnosis not present

## 2017-03-12 DIAGNOSIS — I10 Essential (primary) hypertension: Secondary | ICD-10-CM | POA: Diagnosis not present

## 2017-03-12 DIAGNOSIS — K219 Gastro-esophageal reflux disease without esophagitis: Secondary | ICD-10-CM | POA: Diagnosis not present

## 2017-03-12 DIAGNOSIS — R2689 Other abnormalities of gait and mobility: Secondary | ICD-10-CM | POA: Diagnosis not present

## 2017-03-12 DIAGNOSIS — E213 Hyperparathyroidism, unspecified: Secondary | ICD-10-CM | POA: Diagnosis not present

## 2017-03-12 DIAGNOSIS — E785 Hyperlipidemia, unspecified: Secondary | ICD-10-CM | POA: Diagnosis not present

## 2017-03-12 DIAGNOSIS — S72001S Fracture of unspecified part of neck of right femur, sequela: Secondary | ICD-10-CM | POA: Diagnosis not present

## 2017-03-12 DIAGNOSIS — I5030 Unspecified diastolic (congestive) heart failure: Secondary | ICD-10-CM | POA: Diagnosis not present

## 2017-03-12 DIAGNOSIS — M6281 Muscle weakness (generalized): Secondary | ICD-10-CM | POA: Diagnosis not present

## 2017-03-12 DIAGNOSIS — Z4789 Encounter for other orthopedic aftercare: Secondary | ICD-10-CM | POA: Diagnosis not present

## 2017-03-12 DIAGNOSIS — Z9181 History of falling: Secondary | ICD-10-CM | POA: Diagnosis not present

## 2017-03-12 DIAGNOSIS — I482 Chronic atrial fibrillation: Secondary | ICD-10-CM | POA: Diagnosis not present

## 2017-03-12 LAB — TYPE AND SCREEN
ABO/RH(D): O NEG
ANTIBODY SCREEN: NEGATIVE
Unit division: 0
Unit division: 0

## 2017-03-12 LAB — BPAM RBC
Blood Product Expiration Date: 201810012359
Blood Product Expiration Date: 201810082359
ISSUE DATE / TIME: 201809080042
ISSUE DATE / TIME: 201809080042
UNIT TYPE AND RH: 9500
Unit Type and Rh: 9500

## 2017-03-12 MED ORDER — INFLUENZA VAC SPLIT HIGH-DOSE 0.5 ML IM SUSY: 0.5000 mL | PREFILLED_SYRINGE | INTRAMUSCULAR | Status: DC

## 2017-03-12 NOTE — Progress Notes (Signed)
Nutrition Follow-up  INTERVENTION:   Continue Ensure Enlive po BID, each supplement provides 350 kcal and 20 grams of protein RD will continue to monitor  NUTRITION DIAGNOSIS:   Increased nutrient needs related to wound healing as evidenced by estimated needs.  Ongoing.  GOAL:   Patient will meet greater than or equal to 90% of their needs  Progressing.  MONITOR:   PO intake, Supplement acceptance, Labs, Weight trends  ASSESSMENT:   Pt with PMH of renal neoplasm, diastolic CHF, DM, COPD, dementia with behavioral disturbance, HTN, and HLD. Presents this admission with right hip fracture. Surgical intervention planned for 9/9.  9/9: s/p ARTHROPLASTY BIPOLAR HIP (HEMIARTHROPLASTY) (Right)   Patient currently eating 25-100% of meals, accepting Ensure supplements as well. Per chart review, pt will discharge to SNF. Will continue to monitor for needs.  Medications: Colace capsule BID, Ferrous sulfate tablet BID, Vitamin B-12 tablet daily  Labs reviewed: GFR: 30  Diet Order:  Diet regular Room service appropriate? Yes; Fluid consistency: Thin  Skin:  Reviewed, no issues  Last BM:  9/11  Height:   Ht Readings from Last 1 Encounters:  03/07/17 5\' 7"  (1.702 m)    Weight:   Wt Readings from Last 1 Encounters:  03/05/17 145 lb (65.8 kg)    Ideal Body Weight:  61.4 kg  BMI:  Body mass index is 22.71 kg/m.  Estimated Nutritional Needs:   Kcal:  1700-1900 (26-29 kcal/kg)  Protein:  85-95 grams (1.3-1.5 g/kg)  Fluid:  >1.7 L/day  EDUCATION NEEDS:   Education needs addressed  Clayton Bibles, MS, RD, LDN Pager: [phone] After Hours Pager: [phone]

## 2017-03-12 NOTE — Clinical Social Work Placement (Signed)
   CLINICAL SOCIAL WORK PLACEMENT  NOTE  Date:  03/12/2017  Patient Details  Name: [name] MRN: [id number] Date of Birth: 06/05/1928  Clinical Social Work is seeking post-discharge placement for this patient at the [place] level of care (*CSW will initial, date and re-position this form in  chart as items are completed):  Yes   Patient/family provided with [facility] Work Department's list of facilities offering this level of care within the geographic area requested by the patient (or if unable, by the patient's family).  Yes   Patient/family informed of their freedom to choose among providers that offer the needed level of care, that participate in Medicare, Medicaid or managed care program needed by the patient, have an available bed and are willing to accept the patient.  Yes   Patient/family informed of [facility]'s ownership interest in [facility] and [facility], as well as of the fact that they are under no obligation to receive care at these facilities.  PASRR submitted to EDS on 03/04/17     PASRR number received on       Existing PASRR number confirmed on       FL2 transmitted to all facilities in geographic area requested by pt/family on 03/11/17     FL2 transmitted to all facilities within larger geographic area on       Patient informed that his/her managed care company has contracts with or will negotiate with certain facilities, including the following:        Yes   Patient/family informed of bed offers received.  Patient chooses bed at [facility]     Physician recommends and patient chooses bed at      Patient to be transferred to [facility] on 03/12/17.  Patient to be transferred to facility by [facility]     Patient family notified on 03/12/17 of transfer.  Name of family member notified:  SPOUSE     PHYSICIAN       Additional Comment: Pt / spouse are in agreement with dc to [facility]  today. [facility] transport is required. Medical necessity form completed. DC Summary sent to SNF for review. Scripts included in [employer]. # for report provided to nsg.    _______________________________________________ Luretha Rued, [place]  [phone] 03/12/2017, 4:04 PM

## 2017-03-12 NOTE — Progress Notes (Signed)
Report called to [place] at [employer].

## 2017-03-12 NOTE — Discharge Summary (Signed)
Physician Discharge Summary  [name]:[id number] DOB: 08-29-1927 DOA: 03/05/2017  PCP: Tonette Bihari, MD  Admit date: 03/05/2017 Discharge date: 03/12/2017  Time spent: > 35 minutes  Recommendations for Outpatient Follow-up:  1. Patient had urinary retention. Continue voiding trials at SNF and if unsuccessful ensure patient follows up with urology 2. Ensure follow-up with orthopedic surgeon. Last seen by Danae Orleans   Discharge Diagnoses:  Active Problems:   Hip fracture, unspecified laterality, closed, initial encounter ([place])   Hip fracture ([place])   Discharge Condition: stable  Diet recommendation: regular diet  Filed Weights   03/05/17 1311  Weight: 65.8 kg (145 lb)    History of present illness:  83 year old female with chronic A. fib on anticoagulation, diastolic CHF, COPD, severe dementia with behavioral disturbance, LBBB, hypertension, hyperlipidemia, renal neoplasm brought to the ED from home after sustaining a mechanical fall and landed on her hip. She was found to be agitated in the ED and unable to provide any history. In the ED x-ray was consistent with right hip fracture.    Hospital Course:  Active Problems:   Hip fracture, Right, closed, subsequent encounter [facility]) Status post right hemiarthroplasty on 9/9. Tolerated well.  D/c to SNF for continue PT  Preoperative cardiac clearance. As per gupta perioperative cardiac risk score, patients estimated perioperative including 30 day postoperative risk for myocardial infarction and or cardiac arrest is 1.75%. - pt is s/p operation and no report of chest pain.  Chronic/persistent atrial fibrillation Eliquis resumed  - Stable on Coreg  Acute on Chronic kidney disease stage III ( baseline creatinine 1.4-1.6) - continue arb on d/c as patient is back to baseline  Postoperative blood loss anemia Stable. Monitor for now.  Leukocytosis Resolved. No signs of infection.  Vascular/mixed  dementia with agitation Continue when necessary Haldol and Seroquel.   Essential hypertension Elevated blood pressure due to pain and agitation. Continue prior to admission medication regimen  Thrombocytopenia Possibly stress-induced. Improving   Prediabetes A1c of 6.2. CBG stable.   Vitamin B 12 deficiency Continue prior to admission medication regimen  Procedures:  As listed above  Consultations:  Orthopedic surgery  Discharge Exam: Vitals:   03/12/17 0559 03/12/17 0756  BP: (!) 142/64 (!) 137/58  Pulse: 73 80  Resp: 18 18  Temp: (!) 97.4 F (36.3 C) 98.6 F (37 C)  SpO2: 100% 97%    General: Pt in nad, alert and awake Cardiovascular: no cyanosis, no rubs Respiratory: no increased wob, no wheezes  Discharge Instructions   Discharge Instructions    Call MD for:  severe uncontrolled pain    Complete by:  As directed    Call MD for:  temperature >100.4    Complete by:  As directed    Diet - low sodium heart healthy    Complete by:  As directed    Discharge instructions    Complete by:  As directed    Please try voiding trials at SNF. Patient had acute urinary retention.   Increase activity slowly    Complete by:  As directed    Weight bearing as tolerated    Complete by:  As directed    Laterality:  right   Extremity:  Lower     Current Discharge Medication List    START taking these medications   Details  HYDROcodone-acetaminophen (NORCO/VICODIN) 5-325 MG tablet Take 1-2 tablets by mouth every 6 (six) hours as needed for moderate pain. Qty: 40 tablet, Refills: 0  CONTINUE these medications which have NOT CHANGED   Details  albuterol (PROVENTIL HFA;VENTOLIN HFA) 108 (90 Base) MCG/ACT inhaler Inhale 2 puffs into the lungs every 6 (six) hours as needed for wheezing or shortness of breath. Qty: 3 Inhaler, Refills: 1    apixaban (ELIQUIS) 2.5 MG TABS tablet Take 1 tablet (2.5 mg total) by mouth 2 (two) times daily. Qty: 180 tablet, Refills:  0   Associated Diagnoses: Paroxysmal atrial fibrillation (HCC)    aspirin EC 81 MG tablet Take 81 mg by mouth daily.    atorvastatin (LIPITOR) 80 MG tablet take 1 tablet by mouth at bedtime Qty: 90 tablet, Refills: 1    calcitRIOL (ROCALTROL) 0.25 MCG capsule take 1 capsule by mouth EVERY MONDAY, WEDNESDAY AND FRIDAY Qty: 45 capsule, Refills: 1   Associated Diagnoses: Secondary hyperparathyroidism (HCC)    carvedilol (COREG) 6.25 MG tablet Take 1 tablet (6.25 mg total) by mouth 2 (two) times daily with a meal. Take 1 tablet by mouth two  times daily with meals Qty: 180 tablet, Refills: 1    docusate sodium (COLACE) 100 MG capsule Take 2 capsules (200 mg total) by mouth 2 (two) times daily. Reported on 12/20/2015 Qty: 60 capsule, Refills: 2    haloperidol (HALDOL) 0.5 MG tablet Take 0.5 mg by mouth 3 (three) times daily as needed for agitation.    loratadine (CLARITIN) 10 MG tablet Take 1 tablet (10 mg total) by mouth daily. Qty: 30 tablet, Refills: 11   Associated Diagnoses: Seasonal allergic rhinitis, unspecified chronicity, unspecified trigger    losartan (COZAAR) 50 MG tablet Take 1 tablet (50 mg total) by mouth daily. Call office for appointment, no refills until seen Qty: 90 tablet, Refills: 0    OVER THE COUNTER MEDICATION Take 1 tablet by mouth every 6 (six) hours as needed (pain). Pt's husband orders OTC "pain pills" from TV. Husband cannot verify the name of the med or where he orders them from.    QUEtiapine (SEROQUEL) 50 MG tablet Take 1 tablet (50 mg total) by mouth at bedtime. Qty: 90 tablet, Refills: 1   Associated Diagnoses: Dementia, vascular, mixed, with behavioral disturbance    ranitidine (ZANTAC) 150 MG tablet take 1 tablet by mouth twice a day Qty: 180 tablet, Refills: 1    senna-docusate (SENOKOT-S) 8.6-50 MG tablet Take 1 tablet by mouth 2 (two) times daily as needed for mild constipation. Qty: 60 tablet, Refills: 2    vitamin B-12 (CYANOCOBALAMIN) 100 MCG  tablet Take 1 tablet (100 mcg total) by mouth daily. Qty: 30 tablet, Refills: 3   Associated Diagnoses: Low serum vitamin B12       No Known Allergies Follow-up Information    Tonette Bihari, MD Follow up.   Specialty:  Family Medicine Contact information: [place] [address] [phone]        Paralee Cancel, MD. Schedule an appointment as soon as possible for a visit in 2 weeks.   Specialty:  Orthopedic Surgery Contact information: [address] [place] [address] [place] [address] [phone]            The results of significant diagnostics from this hospitalization (including imaging, microbiology, ancillary and laboratory) are listed below for reference.    Significant Diagnostic Studies: Dg Chest 1 View  Result Date: 03/05/2017 CLINICAL DATA:  Examination. No current chest complaints. History of CHF, COPD, hypertension, diabetes. EXAM: CHEST 1 VIEW COMPARISON:  PA and lateral chest x-ray of May 12, 2013 FINDINGS: The lungs  are well-expanded. There is no focal infiltrate. There is no pleural effusion. The cardiac silhouette is enlarged. The pulmonary vascularity is not engorged. There is calcification in the wall of the thoracic aorta. There is multilevel degenerative disc disease of the thoracic spine. There are degenerative changes of the right shoulder. IMPRESSION: There is mild enlarged of the cardiac silhouette without significant pulmonary vascular congestion or pulmonary edema. No acute pneumonia. Thoracic aortic atherosclerosis. Electronically Signed   By: David  Martinique M.D.   On: 03/05/2017 09:18   Pelvis Portable  Result Date: 03/08/2017 CLINICAL DATA:  Right hip arthroplasty EXAM: PORTABLE PELVIS 1-2 VIEWS COMPARISON:  None. FINDINGS: Interval right hip arthroplasty without failure or complication. No dislocation. Minimally displaced isolated right greater trochanteric fracture. Postsurgical changes in the surrounding soft  tissues. Peripheral vascular atherosclerotic disease. IMPRESSION: Interval right hip arthroplasty without failure or complication. Electronically Signed   By: Kathreen Devoid   On: 03/08/2017 15:15   Dg Hip Unilat With Pelvis 2-3 Views Right  Result Date: 03/05/2017 CLINICAL DATA:  Found on the floor by patient's husband complaining of severe right hip pain with obvious deformity. EXAM: DG HIP (WITH OR WITHOUT PELVIS) 2-3V RIGHT COMPARISON:  No recent studies in [facility] FINDINGS: The patient has sustained an acute subcapital fracture of the right hip. There is mild superior migration of the femoral neck and intertrochanteric region. There appears to be a fracture through the greater trochanter as well. The bony pelvis is intact though subjectively osteopenic. The pubic rami appear intact. There are degenerative changes of the lower lumbar spine. The left hip is grossly normal. IMPRESSION: The patient has sustained an acute subcapital fracture of the right hip. A fracture extends through the greater trochanter as well. Electronically Signed   By: David  Martinique M.D.   On: 03/05/2017 09:17    Microbiology: No results found for this or any previous visit (from the past 240 hour(s)).   Labs: Basic Metabolic Panel:  Recent Labs Lab 03/06/17 0526 03/07/17 2202 03/08/17 1244 03/09/17 0533 03/10/17 0528 03/11/17 0618  NA 141 139 140 138 140 140  K 5.0 4.5 5.2* 5.2* 4.5 4.6  CL 108 107  --  107 110 114*  CO2 25 23  --  22 20* 21*  GLUCOSE 142* 153* 120* 182* 134* 115*  BUN 31* 32*  --  53* 61* 53*  CREATININE 1.43* 1.42*  --  2.00* 2.10* 1.51*  CALCIUM 9.1 9.4  --  8.5* 8.9 8.5*   Liver Function Tests: No results for input(s): AST, ALT, ALKPHOS, BILITOT, PROT, ALBUMIN in the last 168 hours. No results for input(s): LIPASE, AMYLASE in the last 168 hours. No results for input(s): AMMONIA in the last 168 hours. CBC:  Recent Labs Lab 03/06/17 0526 03/08/17 1244 03/09/17 0533 03/10/17 0528  03/11/17 0618  WBC 11.5*  --  10.2 10.7* 7.9  HGB 10.5* 9.2* 8.7* 9.5* 8.4*  HCT 32.8* 27.0* 26.6* 29.5* 26.2*  MCV 95.3  --  96.0 94.9 96.0  PLT 162  --  111* 138* 142*   Cardiac Enzymes: No results for input(s): CKTOTAL, CKMB, CKMBINDEX, TROPONINI in the last 168 hours. BNP: BNP (last 3 results) No results for input(s): BNP in the last 8760 hours.  ProBNP (last 3 results) No results for input(s): PROBNP in the last 8760 hours.  CBG:  Recent Labs Lab 03/05/17 2138 03/08/17 1350  GLUCAP 167* 141*    Signed:  Velvet Bathe MD.  [facility] 03/12/2017, 3:20 PM

## 2017-03-12 NOTE — Progress Notes (Signed)
Pt has a ST Rehab bed at [facility] today if stable for dc. Spouse is in agreement with this plan. CSW will follow to assist with dc planning needs.  Werner Lean LCSW [phone]

## 2017-03-13 DIAGNOSIS — Z9181 History of falling: Secondary | ICD-10-CM | POA: Diagnosis not present

## 2017-03-13 DIAGNOSIS — I10 Essential (primary) hypertension: Secondary | ICD-10-CM | POA: Diagnosis not present

## 2017-03-13 DIAGNOSIS — K219 Gastro-esophageal reflux disease without esophagitis: Secondary | ICD-10-CM | POA: Diagnosis not present

## 2017-03-25 ENCOUNTER — Other Ambulatory Visit: Payer: Self-pay | Admitting: *Deleted

## 2017-03-25 NOTE — Patient Outreach (Signed)
[place] [facility]) Care Management  03/25/2017  [name] May 03, 1928 [id number]   Met with [name], SW and discharge planner at [facility].  She reports patient lives with spouse, who is her caregiver. Patient has confusion and is in facility for rehab after a fall with rt femur fracture.  No discharge date set up as of yet.   Attempted to meet with patient and patient spouse, but patient asleep and spouse not in room.   Plan to follow up at next facility visit. Nancy Crochet. Laymond Purser, RN, BSN, [place] [phone]) Business Cell  [phone]) Toll Free Office

## 2017-04-02 ENCOUNTER — Other Ambulatory Visit: Payer: Self-pay

## 2017-04-02 DIAGNOSIS — M353 Polymyalgia rheumatica: Secondary | ICD-10-CM | POA: Diagnosis not present

## 2017-04-02 DIAGNOSIS — S329XXA Fracture of unspecified parts of lumbosacral spine and pelvis, initial encounter for closed fracture: Secondary | ICD-10-CM | POA: Diagnosis not present

## 2017-04-02 NOTE — Patient Outreach (Signed)
[place] [facility]) Care Management  04/02/2017  MISHAWN DIDION 05-08-1928 [id number]     Transition of Care Referral  Referral Date: 04/01/17 Referral Source: [facility] Referral Date of Admission: [facility] 03/05/17 to 03/12/17 Diagnosis:fall, right femur fracture Date of Discharge: left [facility] AMA on 03/29/17 Insurance: [facility] Medicare    Outreach attempt # 1 to patient. No answer at present and unable to leave a message.   Plan: RN CM will make outreach attempt to patient within one business day.   Enzo Montgomery, RN,BSN,CCM [place] Management Telephonic Care Management Coordinator Direct Phone: [phone] Toll Free: [phone] Fax: [phone]

## 2017-04-03 ENCOUNTER — Other Ambulatory Visit: Payer: Self-pay

## 2017-04-03 NOTE — Patient Outreach (Signed)
[facility]) Care Management  04/03/2017  [name] Jul 26, 1927 [id number]   Transition of Care Referral  Referral Date: 04/01/17 Referral Source: [facility] Referral Date of Admission: [facility] 03/05/17 to 03/12/17 Diagnosis:fall, right femur fracture Date of Discharge: left [facility] on 03/29/17 Insurance: [facility] Medicare   Outreach attempt # 2 to patient. Spoke with spouse(ROI on file). Screening completed.   Social: Patient resides in her home along with her spouse who provides care to her. Spouse states that patient requires some assistance with ADLs/IADLs. He drives her to appts. Patient currently getting [facility] aide. Spouse is working on getting PT in the home as well. Patient sustained a fall last month which led to hospital stay.   Conditions: Per chart review patient has PMH of dementia, HTN, pre-diabetes, thrombocytopenia, Vitamin B12 deficiency. Spouse states that patient "lost" diabetes some time ago. He is not checking patient's cbgs and voices no meter in the home. Last A1C was 6.2(01/01/17). Spouse does report that he checks her BP every other day and readings are WNL. Patient was in the hospital from 03/05/17-03/12/17 for a fall and right femur fracture. She then went to rehab at [facility]. Spouse states he signed patient out [facility] on 03/29/17 as they "were not doing anything for her." Spouse does not seem very knowledgeable regarding patient's condition and could use some support and education.    Medications: he voices patient taking about eight meds. Denies any issues affording and managing meds. However, spouse unable to state reasons why patient taking meds.    Appointments: She goes for ortho surgeon f/u appt on 04/24/17. Spouse has not yet made PCP f/u appt and RN CM advised/encouraged spouse to do so as soon as possible.  Advance Directives: he stets he has form in the home but was not interested in  completing at this time. He did verbalize that patient does not want "no tubes."  Consent: [facility] services reviewed and discussed with spouse. Verbal consent for services given by spouse.    Plan:  RN CM will notify [facility] administrative assistant of case status. RN CM will send referral to [facility] RN for further in home eval/assessment of care needs and management of chronic conditions.  Enzo Montgomery, RN,BSN,CCM [place] Management Telephonic Care Management Coordinator Direct Phone: [phone] Toll Free: [phone] Fax: [phone]

## 2017-04-06 ENCOUNTER — Other Ambulatory Visit: Payer: Self-pay

## 2017-04-06 NOTE — Patient Outreach (Signed)
[facility] [facility]) Care Management  04/06/17  [name] [birth year] [id number]  RNCM received referral on 04/03/2017 from telephonic [place]. Patient was recently in hospital from 9/6-9/13/2018 for a fall and right femur fracture. She was discharged from acute hospital to SNF [facility]). Her husband took her home from SNF on 03/29/2017 AMA.  Per referral note, patient resides in her home with her spouse who is her caregiver. Requires some assistance with ADLs/IADLs. Spouse drives her to appointments. Patient currently getting [facility] aide and trying to get PT as well. He does not check her blood sugar and they do not have a glucometer. He does check her blood pressure every other day and reported that BPs were WNL.  Patient has a PMH of dementia, HTN, pre-diabetes (last A1C 6.2 on 01/01/17), thrombocytopenia, vitamin B12 deficiency, chronic atrial fibrillation, diastolic CHF, COPD, LBBB, HLD, renal neoplasm Patient has a follow up appointment with orthopedic surgeon on 04/24/2017 but does not have a PCP appointment scheduled for post discharge follow up.  Call completed with patient's husband, [name]. Patient identification verified.  Patient's spouse stated that she is "doing pretty good and has been getting around pretty good." He stated that he talked with the nurse from [facility] and asked for a physical therapist because the brochure said that they could have one, but he has not heard anything further about it. He is still waiting to hear back from her.   Patient currently has an in-home aide through [facility]. She is there 5 days a week for 6 hours a day and assists patient with ADLs and IADLs. The RN comes by to do supervisory visits on in-home aide - they do not have "home health" nursing assigned at present. He stated he would prefer if he had an aide coming 7 days a week, but he does provide care for patient on Saturdays and Sundays.  He reported  that patient saw her orthopedic surgeon last week for a follow up and he said that everything is looking good. She has a follow up appointment with him again on 04/24/2017. Patient has not had a PCP visit since discharge home and [name] stated that he has not yet called to schedule it. He stated, "I need to do that." RNCM offered to assist with scheduling appointment and he was agreeable. Also, he stated that he did not have the number for her doctor. RNCM provided contact information for [facility] to him.  He stated that patient has not really had any complaints of pain or anything since her discharge last week. He stated that she was at [facility], but felt they weren't really doing anything for her and she wanted to come home, so he brought her home. He stated that she is not steady on her feet and that is why he wants a PT to come and see if they can help her with getting stronger and to walk better. He reported that she has a cane and a walker, but has primarily been using the wheelchair due to her balance and unsteadiness. He stated that she is walking a little bit with the walker, but is using the wheelchair mostly. She also requires assistance with bathing and dressing for the same reasons (being unsteady). He stated that the aide or himself assists her. Patient's husband stated that he has paperwork for Advance directives, but needs to finish filling it out and get it notarized. He stated that he is going to  get his daughter to assist him with it. [name] stated that he gives patient her medications, but he really wants to talk with the doctor about them to make sure they are correct. He stated he has everything she needs and has no trouble paying for her medications, but he wants to make sure nothing was changed and he is not aware of it. RNCM educated that is one of the reasons a follow up with PCP is important and encouraged him to take all of her medications and discuss with PCP at  her appointment. He verbalized understanding. Depression screen [facility] 2/9 03/03/2017 02/10/2017 01/01/2017 12/25/2016 08/21/2016  Decreased Interest 0 0 0 0 0  Down, Depressed, Hopeless 0 0 0 0 0  PHQ - 2 Score 0 0 0 0 0  Some recent data might be hidden   Fall Risk  04/06/2017 03/03/2017 02/10/2017 01/01/2017 12/25/2016  Falls in the past year? Yes Yes Yes Yes Yes  Number falls in past yr: 2 or more 2 or more 2 or more 2 or more 2 or more  Injury with Fall? Yes Yes Yes Yes Yes  Comment - - - - -  Risk Factor Category  High Fall Risk High Fall Risk High Fall Risk - High Fall Risk  Risk for fall due to : History of fall(s);Impaired balance/gait;Impaired mobility - - - -  Follow up Falls evaluation completed;Falls prevention discussed Falls prevention discussed - - -   Plan: He currently has no questions or concerns. RNCM provided contact information and encouraged him to call with any needs. Also, home visit was scheduled for next week to complete risk assessment and review medications. THN CM Care Plan Problem One     Most Recent Value  Care Plan Problem One  Patient at risk for injury related to falls as evidenced by history of falls with most recent fall ending in right hip fracture  Role Documenting the Problem One  Care Management [place] for Problem One  Active  THN Long Term Goal   Patient will not require a hospital admission related to falls within the next 31 days  THN Long Term Goal Start Date  04/06/17  Interventions for Problem One Long Term Goal  RNCM to complete in-home fall assessment and discuss risk factors,  RNCM to provide education about home safety and follow up regarding PT referral for evaluation and treatment  THN CM Short Term Goal #1   Patient will follow up with PCP within next 1 week  THN CM Short Term Goal #1 Start Date  04/06/17  Interventions for Short Term Goal #1  RNCM provided education regarding importance of post-discharge follow up and assisted patient's  husband in scheduling appointment for 04/09/2017. RNCM verified patient's husband will be providing transportation to appointment.   THN CM Short Term Goal #2   Patient will take all medications as prescribed for the next 30 days  THN CM Short Term Goal #2 Start Date  04/06/17  Interventions for Short Term Goal #2  RNCM provided education about importance of taking all medications as prescribed. Encouraged patient's husband to take all medications to follow up appointment on 04/09/2017 to verify she is taking the correct medications and he verbalized understanding.,  THN CM Short Term Goal #3  Patient will receive approval for home health PT with [facility] within the next 2 weeks  THN CM Short Term Goal #3 Start Date  04/06/17  Interventions for Short Tern Goal #3  RNCM to reach out to PCP to request order for home health PT to evaluate and treat patient to be sent to [facility].     Nancy R. Blayden Conwell, RN, BSN, CCM [facility] Care Management Coordinator [phone] Addendum: RNCM called [facility] and spoke with [facility], advising of patient's recent discharged and concerns about her medicines. Follow up appointment was scheduled for 04/09/2017 at 10:30 am. [facility] contacted patient's husband, [name] and provided him with appointment information. He will be providing transportation for patient to appointment.

## 2017-04-09 ENCOUNTER — Ambulatory Visit (INDEPENDENT_AMBULATORY_CARE_PROVIDER_SITE_OTHER): Payer: Medicare Other | Admitting: Internal Medicine

## 2017-04-09 ENCOUNTER — Encounter: Payer: Self-pay | Admitting: Internal Medicine

## 2017-04-09 VITALS — BP 100/58 | HR 79 | Temp 98.2°F

## 2017-04-09 DIAGNOSIS — E1142 Type 2 diabetes mellitus with diabetic polyneuropathy: Secondary | ICD-10-CM

## 2017-04-09 DIAGNOSIS — F01518 Vascular dementia, unspecified severity, with other behavioral disturbance: Secondary | ICD-10-CM

## 2017-04-09 DIAGNOSIS — R7989 Other specified abnormal findings of blood chemistry: Secondary | ICD-10-CM | POA: Diagnosis not present

## 2017-04-09 DIAGNOSIS — S72001D Fracture of unspecified part of neck of right femur, subsequent encounter for closed fracture with routine healing: Secondary | ICD-10-CM

## 2017-04-09 DIAGNOSIS — I480 Paroxysmal atrial fibrillation: Secondary | ICD-10-CM | POA: Diagnosis not present

## 2017-04-09 DIAGNOSIS — F0151 Vascular dementia with behavioral disturbance: Secondary | ICD-10-CM

## 2017-04-09 DIAGNOSIS — N2581 Secondary hyperparathyroidism of renal origin: Secondary | ICD-10-CM

## 2017-04-09 DIAGNOSIS — E538 Deficiency of other specified B group vitamins: Secondary | ICD-10-CM

## 2017-04-09 LAB — POCT GLYCOSYLATED HEMOGLOBIN (HGB A1C): Hemoglobin A1C: 6.3

## 2017-04-09 MED ORDER — VITAMIN D 1000 UNITS PO TABS: 1000.0000 [IU] | ORAL_TABLET | Freq: Every day | ORAL | 2 refills | Status: DC

## 2017-04-09 MED ORDER — CALCITRIOL 0.25 MCG PO CAPS: ORAL_CAPSULE | ORAL | 1 refills | Status: DC

## 2017-04-09 MED ORDER — VITAMIN B-12 100 MCG PO TABS: 100.0000 ug | ORAL_TABLET | Freq: Every day | ORAL | 3 refills | Status: DC

## 2017-04-09 MED ORDER — APIXABAN 2.5 MG PO TABS: 2.5000 mg | ORAL_TABLET | Freq: Two times a day (BID) | ORAL | 0 refills | Status: DC

## 2017-04-09 MED ORDER — QUETIAPINE FUMARATE 50 MG PO TABS: 100.0000 mg | ORAL_TABLET | Freq: Every day | ORAL | 1 refills | Status: DC

## 2017-04-09 NOTE — Assessment & Plan Note (Signed)
History of recent history hip fracture -Will provide vitamin D -Will provide patient with home health physical therapy

## 2017-04-09 NOTE — Progress Notes (Signed)
   [facility] Kerrin Mo, MD Phone: [phone]  Reason For Visit:   # Dementia  - Patient somewhat improved with Seroquel at night to help with sleep and agitation. However states that it does not always work. - Overall patient is coping well They have help for patient during the day  - No desire to enter nursing home at this point   # Afib  - Refill patient's Coreg and Eliquis  - Patient without palpation, syncope   #Recent Hip fracture on 9/6  Patient with right femoral neck fracture following a mechanical fall. Status post right hemiarthroplasty on 9/9. Patient then received physical therapy and OT therapy. Patient then went to rehab and left SNF on 03/29/2017 AMA. Husband states they have been doing well since then. Would like a referral for home health PT.   Past Medical History Reviewed problem list.  Medications- reviewed and updated No additions to family history Social history- patient is a non smoker  Objective: BP (!) 100/58   Pulse 79   Temp 98.2 F (36.8 C) (Oral)   SpO2 97%  Gen: NAD, alert, cooperative with exam Cardio: regular rate and rhythm, S1S2 heard, no murmurs appreciated Pulm: clear to auscultation bilaterally, no wheezes, rhonchi or rales Skin: dry, intact, no rashes or lesions  Assessment/Plan: See problem based a/p  Hip fracture (HCC) History of recent history hip fracture -Will provide vitamin D -Will provide patient with home health physical therapy   Dementia, vascular, mixed, with behavioral disturbance Will increase Seroquel from 50 to 100 mg to further help with patient's agitation and issues with sleep at night Follow-up in 1-2 months   [place]-induced atrial fibrillation Rate controlled -Needs a refill on Eliquis and coreg  - Discussed pros and cons of continuing Eliquis  - Patient would like to continue this medication   i

## 2017-04-09 NOTE — Assessment & Plan Note (Signed)
Will increase Seroquel from 50 to 100 mg to further help with patient's agitation and issues with sleep at night Follow-up in 1-2 months

## 2017-04-09 NOTE — Assessment & Plan Note (Signed)
Rate controlled -Needs a refill on Eliquis and coreg  - Discussed pros and cons of continuing Eliquis  - Patient would like to continue this medication

## 2017-04-09 NOTE — Patient Instructions (Signed)
I have refilled your request Eliquis. Please continue taking this medication. I have also increased your Seroquel if your wife has issues with sleeping at night please increase from 50 to 100 mg.

## 2017-04-14 ENCOUNTER — Telehealth: Payer: Self-pay | Admitting: *Deleted

## 2017-04-14 ENCOUNTER — Other Ambulatory Visit: Payer: Self-pay

## 2017-04-14 DIAGNOSIS — H543 Unqualified visual loss, both eyes: Secondary | ICD-10-CM

## 2017-04-14 DIAGNOSIS — R296 Repeated falls: Secondary | ICD-10-CM

## 2017-04-14 NOTE — Patient Outreach (Signed)
[facility]) Care Management   04/14/2017  [name] May 25, 1928 [id number]   [name] is an 83 y.o. female   Home visit completed with patient and her husband, [name].  Subjective:   Objective:  Blood pressure (!) 142/62, pulse 77, height 1.702 m (5\' 7" ), SpO2 92 %. ROS  Physical Exam  Cardiovascular: Normal rate and regular rhythm.  Respiratory: Effort normal and breath sounds normal. No respiratory distress. She has no wheezes.  Musculoskeletal: She exhibits no edema.  Neurological: She is alert.  Skin: Skin is warm and dry.    Encounter Medications:   Outpatient Encounter Prescriptions as of 04/14/2017  Medication Sig  . albuterol (PROVENTIL HFA;VENTOLIN HFA) 108 (90 Base) MCG/ACT inhaler Inhale 2 puffs into the lungs every 6 (six) hours as needed for wheezing or shortness of breath.  Marland Kitchen apixaban (ELIQUIS) 2.5 MG TABS tablet Take 1 tablet (2.5 mg total) by mouth 2 (two) times daily.  Marland Kitchen aspirin EC 81 MG tablet Take 81 mg by mouth daily.  Marland Kitchen atorvastatin (LIPITOR) 80 MG tablet take 1 tablet by mouth at bedtime  . calcitRIOL (ROCALTROL) 0.25 MCG capsule take 1 capsule by mouth EVERY MONDAY, WEDNESDAY AND FRIDAY  . carvedilol (COREG) 6.25 MG tablet Take 1 tablet (6.25 mg total) by mouth 2 (two) times daily with a meal. Take 1 tablet by mouth two  times daily with meals  . cholecalciferol (VITAMIN D) 1000 units tablet Take 1 tablet (1,000 Units total) by mouth daily.  Marland Kitchen docusate sodium (COLACE) 100 MG capsule Take 2 capsules (200 mg total) by mouth 2 (two) times daily. Reported on 12/20/2015  . losartan (COZAAR) 50 MG tablet Take 1 tablet (50 mg total) by mouth daily. Call office for appointment, no refills until seen  . QUEtiapine (SEROQUEL) 50 MG tablet Take 2 tablets (100 mg total) by mouth at bedtime.  . senna-docusate (SENOKOT-S) 8.6-50 MG tablet Take 1 tablet by mouth 2 (two) times daily as needed for mild constipation.  . vitamin B-12  (CYANOCOBALAMIN) 100 MCG tablet Take 1 tablet (100 mcg total) by mouth daily.   No facility-administered encounter medications on file as of 04/14/2017.     Functional Status:   In your present state of health, do you have any difficulty performing the following activities: 04/06/2017 03/05/2017  Hearing? N -  Vision? Y -  Difficulty concentrating or making decisions? Y -  Walking or climbing stairs? Y -  Dressing or bathing? Y -  Doing errands, shopping? Y N  Preparing Food and eating ? Y -  Using the Toilet? Y -  In the past six months, have you accidently leaked urine? N -  Do you have problems with loss of bowel control? N -  Managing your Medications? Y -  Managing your Finances? Y -  Housekeeping or managing your Housekeeping? Y -  Some recent data might be hidden    Fall/Depression Screening:    Fall Risk  04/06/2017 03/03/2017 02/10/2017  Falls in the past year? Yes Yes Yes  Number falls in past yr: 2 or more 2 or more 2 or more  Injury with Fall? Yes Yes Yes  Comment - - -  Risk Factor Category  High Fall Risk High Fall Risk High Fall Risk  Risk for fall due to : History of fall(s);Impaired balance/gait;Impaired mobility - -  Follow up Falls evaluation completed;Falls prevention discussed Falls prevention discussed -   PHQ 2/9 Scores 04/09/2017 04/06/2017 04/03/2017 03/03/2017 02/10/2017  01/01/2017 12/25/2016  PHQ - 2 Score 0 - - 0 0 0 0  Exception Documentation - Other- indicate reason in comment box Other- indicate reason in comment box - - - -  Not completed - call completed with spouse call completed with spouse - - - -    Assessment:   Having difficulty with balance and steadiness. Husband reported that she has been able to walk with a walker a little. She is still getting home health PT. She also still has an in-home aide through [facility]. She is there 5 days a week for 6 hours a day and assists patient with ADLs and IADLs.  Patient denies any pain or concerns. She stated  that she feels things are going well.  Plan: Will continue to follow for transition of care.  [place] R. Aman Bonet, RN, BSN, [place] Management Coordinator [phone]

## 2017-04-14 NOTE — Telephone Encounter (Addendum)
[place] wanted to followup on [facility] orders.  At this time they had not been processed, will send to [facility] now.  Also, would like script sent to [facility] for a regular rolling walker as she think pt is too unstable for the rollator walker that she has.  Lastly, pt asked victoria if she could have a fluid pill for the swelling in her legs. [place] advised to elevate feet but she would ask doctor about fluid pills.  To pcp. Suri Tafolla, Salome [name], CMA

## 2017-04-20 ENCOUNTER — Telehealth: Payer: Self-pay | Admitting: *Deleted

## 2017-04-20 DIAGNOSIS — L89152 Pressure ulcer of sacral region, stage 2: Secondary | ICD-10-CM | POA: Diagnosis not present

## 2017-04-20 DIAGNOSIS — Z9181 History of falling: Secondary | ICD-10-CM | POA: Diagnosis not present

## 2017-04-20 DIAGNOSIS — E1142 Type 2 diabetes mellitus with diabetic polyneuropathy: Secondary | ICD-10-CM | POA: Diagnosis not present

## 2017-04-20 DIAGNOSIS — I4891 Unspecified atrial fibrillation: Secondary | ICD-10-CM | POA: Diagnosis not present

## 2017-04-20 DIAGNOSIS — Z7901 Long term (current) use of anticoagulants: Secondary | ICD-10-CM | POA: Diagnosis not present

## 2017-04-20 DIAGNOSIS — Z96641 Presence of right artificial hip joint: Secondary | ICD-10-CM | POA: Diagnosis not present

## 2017-04-20 DIAGNOSIS — S72001D Fracture of unspecified part of neck of right femur, subsequent encounter for closed fracture with routine healing: Secondary | ICD-10-CM | POA: Diagnosis not present

## 2017-04-20 NOTE — Telephone Encounter (Signed)
Darlina Guys at [facility] notified that order has been placed for rolling walker. Hubbard Hartshorn, RN, BSN

## 2017-04-20 NOTE — Telephone Encounter (Signed)
Sent in order for rolling walker. Agree with elevating patient's legs for venous stasis, would not recommend fluid pill.

## 2017-04-20 NOTE — Addendum Note (Signed)
Addended by: Kerrin Mo Z on: 04/20/2017 01:26 PM   Modules accepted: Orders

## 2017-04-20 NOTE — Telephone Encounter (Signed)
[name] went out and evaluated pt today.  Stage 2 on coccyx measuring 1 x 0.5 x 0.2.  She would like verbal orders to go and treat for 2x a week for 2 weeks and then 1x a week for one week.   She would also like an order for PT to evaluate and treat. Jaskiran Pata, Salome [name], CMA

## 2017-04-21 NOTE — Telephone Encounter (Signed)
Estill Bamberg from home health informed. Deseree Kennon Holter, CMA

## 2017-04-21 NOTE — Telephone Encounter (Signed)
Please provide verbal order for PT and treatment of sacral ulcer

## 2017-04-23 DIAGNOSIS — Z7901 Long term (current) use of anticoagulants: Secondary | ICD-10-CM | POA: Diagnosis not present

## 2017-04-23 DIAGNOSIS — S72001D Fracture of unspecified part of neck of right femur, subsequent encounter for closed fracture with routine healing: Secondary | ICD-10-CM | POA: Diagnosis not present

## 2017-04-23 DIAGNOSIS — I4891 Unspecified atrial fibrillation: Secondary | ICD-10-CM | POA: Diagnosis not present

## 2017-04-23 DIAGNOSIS — Z96641 Presence of right artificial hip joint: Secondary | ICD-10-CM | POA: Diagnosis not present

## 2017-04-23 DIAGNOSIS — L89152 Pressure ulcer of sacral region, stage 2: Secondary | ICD-10-CM | POA: Diagnosis not present

## 2017-04-23 DIAGNOSIS — Z9181 History of falling: Secondary | ICD-10-CM | POA: Diagnosis not present

## 2017-04-23 DIAGNOSIS — E1142 Type 2 diabetes mellitus with diabetic polyneuropathy: Secondary | ICD-10-CM | POA: Diagnosis not present

## 2017-04-24 DIAGNOSIS — Z96641 Presence of right artificial hip joint: Secondary | ICD-10-CM | POA: Diagnosis not present

## 2017-04-24 DIAGNOSIS — Z471 Aftercare following joint replacement surgery: Secondary | ICD-10-CM | POA: Diagnosis not present

## 2017-04-28 DIAGNOSIS — Z96641 Presence of right artificial hip joint: Secondary | ICD-10-CM | POA: Diagnosis not present

## 2017-04-28 DIAGNOSIS — E1142 Type 2 diabetes mellitus with diabetic polyneuropathy: Secondary | ICD-10-CM | POA: Diagnosis not present

## 2017-04-28 DIAGNOSIS — Z7901 Long term (current) use of anticoagulants: Secondary | ICD-10-CM | POA: Diagnosis not present

## 2017-04-28 DIAGNOSIS — S72001D Fracture of unspecified part of neck of right femur, subsequent encounter for closed fracture with routine healing: Secondary | ICD-10-CM | POA: Diagnosis not present

## 2017-04-28 DIAGNOSIS — I4891 Unspecified atrial fibrillation: Secondary | ICD-10-CM | POA: Diagnosis not present

## 2017-04-28 DIAGNOSIS — Z9181 History of falling: Secondary | ICD-10-CM | POA: Diagnosis not present

## 2017-04-28 DIAGNOSIS — L89152 Pressure ulcer of sacral region, stage 2: Secondary | ICD-10-CM | POA: Diagnosis not present

## 2017-04-29 ENCOUNTER — Ambulatory Visit: Payer: Medicare Other | Admitting: Internal Medicine

## 2017-04-29 ENCOUNTER — Telehealth: Payer: Self-pay

## 2017-04-29 NOTE — Telephone Encounter (Signed)
Shanon Brow from [facility] called and said that he needs a verbal order for PT for patient for balance, transfer,fall prevention, strength 2wk/1, 3 week/2. He can be reached at [phone].  Thanks.Ozella Almond

## 2017-04-30 DIAGNOSIS — Z9181 History of falling: Secondary | ICD-10-CM | POA: Diagnosis not present

## 2017-04-30 DIAGNOSIS — Z7901 Long term (current) use of anticoagulants: Secondary | ICD-10-CM | POA: Diagnosis not present

## 2017-04-30 DIAGNOSIS — L89152 Pressure ulcer of sacral region, stage 2: Secondary | ICD-10-CM | POA: Diagnosis not present

## 2017-04-30 DIAGNOSIS — S72001D Fracture of unspecified part of neck of right femur, subsequent encounter for closed fracture with routine healing: Secondary | ICD-10-CM | POA: Diagnosis not present

## 2017-04-30 DIAGNOSIS — I4891 Unspecified atrial fibrillation: Secondary | ICD-10-CM | POA: Diagnosis not present

## 2017-04-30 DIAGNOSIS — E1142 Type 2 diabetes mellitus with diabetic polyneuropathy: Secondary | ICD-10-CM | POA: Diagnosis not present

## 2017-04-30 DIAGNOSIS — Z96641 Presence of right artificial hip joint: Secondary | ICD-10-CM | POA: Diagnosis not present

## 2017-04-30 NOTE — Telephone Encounter (Signed)
Please provide verbal order. Thanks Ikeisha Blumberg

## 2017-04-30 NOTE — Telephone Encounter (Signed)
Verbal orders given  

## 2017-05-04 DIAGNOSIS — S72001D Fracture of unspecified part of neck of right femur, subsequent encounter for closed fracture with routine healing: Secondary | ICD-10-CM | POA: Diagnosis not present

## 2017-05-04 DIAGNOSIS — Z9181 History of falling: Secondary | ICD-10-CM | POA: Diagnosis not present

## 2017-05-04 DIAGNOSIS — I4891 Unspecified atrial fibrillation: Secondary | ICD-10-CM | POA: Diagnosis not present

## 2017-05-04 DIAGNOSIS — E1142 Type 2 diabetes mellitus with diabetic polyneuropathy: Secondary | ICD-10-CM | POA: Diagnosis not present

## 2017-05-04 DIAGNOSIS — Z7901 Long term (current) use of anticoagulants: Secondary | ICD-10-CM | POA: Diagnosis not present

## 2017-05-04 DIAGNOSIS — L89152 Pressure ulcer of sacral region, stage 2: Secondary | ICD-10-CM | POA: Diagnosis not present

## 2017-05-04 DIAGNOSIS — Z96641 Presence of right artificial hip joint: Secondary | ICD-10-CM | POA: Diagnosis not present

## 2017-05-06 DIAGNOSIS — I4891 Unspecified atrial fibrillation: Secondary | ICD-10-CM | POA: Diagnosis not present

## 2017-05-06 DIAGNOSIS — Z7901 Long term (current) use of anticoagulants: Secondary | ICD-10-CM | POA: Diagnosis not present

## 2017-05-06 DIAGNOSIS — E1142 Type 2 diabetes mellitus with diabetic polyneuropathy: Secondary | ICD-10-CM | POA: Diagnosis not present

## 2017-05-06 DIAGNOSIS — L89152 Pressure ulcer of sacral region, stage 2: Secondary | ICD-10-CM | POA: Diagnosis not present

## 2017-05-06 DIAGNOSIS — Z9181 History of falling: Secondary | ICD-10-CM | POA: Diagnosis not present

## 2017-05-06 DIAGNOSIS — S329XXA Fracture of unspecified parts of lumbosacral spine and pelvis, initial encounter for closed fracture: Secondary | ICD-10-CM | POA: Diagnosis not present

## 2017-05-06 DIAGNOSIS — Z96641 Presence of right artificial hip joint: Secondary | ICD-10-CM | POA: Diagnosis not present

## 2017-05-06 DIAGNOSIS — S72001D Fracture of unspecified part of neck of right femur, subsequent encounter for closed fracture with routine healing: Secondary | ICD-10-CM | POA: Diagnosis not present

## 2017-05-08 DIAGNOSIS — Z9181 History of falling: Secondary | ICD-10-CM | POA: Diagnosis not present

## 2017-05-08 DIAGNOSIS — E1142 Type 2 diabetes mellitus with diabetic polyneuropathy: Secondary | ICD-10-CM | POA: Diagnosis not present

## 2017-05-08 DIAGNOSIS — Z96641 Presence of right artificial hip joint: Secondary | ICD-10-CM | POA: Diagnosis not present

## 2017-05-08 DIAGNOSIS — S72001D Fracture of unspecified part of neck of right femur, subsequent encounter for closed fracture with routine healing: Secondary | ICD-10-CM | POA: Diagnosis not present

## 2017-05-08 DIAGNOSIS — L89152 Pressure ulcer of sacral region, stage 2: Secondary | ICD-10-CM | POA: Diagnosis not present

## 2017-05-08 DIAGNOSIS — Z7901 Long term (current) use of anticoagulants: Secondary | ICD-10-CM | POA: Diagnosis not present

## 2017-05-08 DIAGNOSIS — I4891 Unspecified atrial fibrillation: Secondary | ICD-10-CM | POA: Diagnosis not present

## 2017-05-09 ENCOUNTER — Other Ambulatory Visit: Payer: Self-pay

## 2017-05-09 NOTE — Patient Outreach (Signed)
[facility]) Care Management  05/09/17  [name] [birth year] [id number]  Successful outreach completed with patient. Patient identification verified.  Patient was very short, but pleasant on the phone, stating that she is "doing alright" and does not have any needs or concerns at present.   Patient ended call before RNCM could request to talk with her husband. RNCM will follow up with patient again within the next week.  [place] R. Rahsaan Weakland, RN, BSN, [place] Management Coordinator [phone]

## 2017-05-11 DIAGNOSIS — S72001D Fracture of unspecified part of neck of right femur, subsequent encounter for closed fracture with routine healing: Secondary | ICD-10-CM | POA: Diagnosis not present

## 2017-05-11 DIAGNOSIS — E1142 Type 2 diabetes mellitus with diabetic polyneuropathy: Secondary | ICD-10-CM | POA: Diagnosis not present

## 2017-05-11 DIAGNOSIS — Z7901 Long term (current) use of anticoagulants: Secondary | ICD-10-CM | POA: Diagnosis not present

## 2017-05-11 DIAGNOSIS — Z96641 Presence of right artificial hip joint: Secondary | ICD-10-CM | POA: Diagnosis not present

## 2017-05-11 DIAGNOSIS — I4891 Unspecified atrial fibrillation: Secondary | ICD-10-CM | POA: Diagnosis not present

## 2017-05-11 DIAGNOSIS — Z9181 History of falling: Secondary | ICD-10-CM | POA: Diagnosis not present

## 2017-05-11 DIAGNOSIS — L89152 Pressure ulcer of sacral region, stage 2: Secondary | ICD-10-CM | POA: Diagnosis not present

## 2017-05-14 DIAGNOSIS — L89152 Pressure ulcer of sacral region, stage 2: Secondary | ICD-10-CM | POA: Diagnosis not present

## 2017-05-14 DIAGNOSIS — Z9181 History of falling: Secondary | ICD-10-CM | POA: Diagnosis not present

## 2017-05-14 DIAGNOSIS — Z96641 Presence of right artificial hip joint: Secondary | ICD-10-CM | POA: Diagnosis not present

## 2017-05-14 DIAGNOSIS — I4891 Unspecified atrial fibrillation: Secondary | ICD-10-CM | POA: Diagnosis not present

## 2017-05-14 DIAGNOSIS — Z7901 Long term (current) use of anticoagulants: Secondary | ICD-10-CM | POA: Diagnosis not present

## 2017-05-14 DIAGNOSIS — S72001D Fracture of unspecified part of neck of right femur, subsequent encounter for closed fracture with routine healing: Secondary | ICD-10-CM | POA: Diagnosis not present

## 2017-05-14 DIAGNOSIS — E1142 Type 2 diabetes mellitus with diabetic polyneuropathy: Secondary | ICD-10-CM | POA: Diagnosis not present

## 2017-05-15 DIAGNOSIS — L89152 Pressure ulcer of sacral region, stage 2: Secondary | ICD-10-CM | POA: Diagnosis not present

## 2017-05-15 DIAGNOSIS — S72001D Fracture of unspecified part of neck of right femur, subsequent encounter for closed fracture with routine healing: Secondary | ICD-10-CM | POA: Diagnosis not present

## 2017-05-15 DIAGNOSIS — E1142 Type 2 diabetes mellitus with diabetic polyneuropathy: Secondary | ICD-10-CM | POA: Diagnosis not present

## 2017-05-15 DIAGNOSIS — Z96641 Presence of right artificial hip joint: Secondary | ICD-10-CM | POA: Diagnosis not present

## 2017-05-15 DIAGNOSIS — Z7901 Long term (current) use of anticoagulants: Secondary | ICD-10-CM | POA: Diagnosis not present

## 2017-05-15 DIAGNOSIS — Z9181 History of falling: Secondary | ICD-10-CM | POA: Diagnosis not present

## 2017-05-15 DIAGNOSIS — I4891 Unspecified atrial fibrillation: Secondary | ICD-10-CM | POA: Diagnosis not present

## 2017-05-19 DIAGNOSIS — I4891 Unspecified atrial fibrillation: Secondary | ICD-10-CM | POA: Diagnosis not present

## 2017-05-19 DIAGNOSIS — Z7901 Long term (current) use of anticoagulants: Secondary | ICD-10-CM | POA: Diagnosis not present

## 2017-05-19 DIAGNOSIS — S72001D Fracture of unspecified part of neck of right femur, subsequent encounter for closed fracture with routine healing: Secondary | ICD-10-CM | POA: Diagnosis not present

## 2017-05-19 DIAGNOSIS — Z96641 Presence of right artificial hip joint: Secondary | ICD-10-CM | POA: Diagnosis not present

## 2017-05-19 DIAGNOSIS — L89152 Pressure ulcer of sacral region, stage 2: Secondary | ICD-10-CM | POA: Diagnosis not present

## 2017-05-19 DIAGNOSIS — E1142 Type 2 diabetes mellitus with diabetic polyneuropathy: Secondary | ICD-10-CM | POA: Diagnosis not present

## 2017-05-19 DIAGNOSIS — Z9181 History of falling: Secondary | ICD-10-CM | POA: Diagnosis not present

## 2017-05-22 DIAGNOSIS — Z96641 Presence of right artificial hip joint: Secondary | ICD-10-CM | POA: Diagnosis not present

## 2017-05-22 DIAGNOSIS — S72001D Fracture of unspecified part of neck of right femur, subsequent encounter for closed fracture with routine healing: Secondary | ICD-10-CM | POA: Diagnosis not present

## 2017-05-22 DIAGNOSIS — Z9181 History of falling: Secondary | ICD-10-CM | POA: Diagnosis not present

## 2017-05-22 DIAGNOSIS — Z7901 Long term (current) use of anticoagulants: Secondary | ICD-10-CM | POA: Diagnosis not present

## 2017-05-22 DIAGNOSIS — I4891 Unspecified atrial fibrillation: Secondary | ICD-10-CM | POA: Diagnosis not present

## 2017-05-22 DIAGNOSIS — L89152 Pressure ulcer of sacral region, stage 2: Secondary | ICD-10-CM | POA: Diagnosis not present

## 2017-05-22 DIAGNOSIS — E1142 Type 2 diabetes mellitus with diabetic polyneuropathy: Secondary | ICD-10-CM | POA: Diagnosis not present

## 2017-05-25 DIAGNOSIS — Z96641 Presence of right artificial hip joint: Secondary | ICD-10-CM | POA: Diagnosis not present

## 2017-05-25 DIAGNOSIS — I4891 Unspecified atrial fibrillation: Secondary | ICD-10-CM | POA: Diagnosis not present

## 2017-05-25 DIAGNOSIS — S72001D Fracture of unspecified part of neck of right femur, subsequent encounter for closed fracture with routine healing: Secondary | ICD-10-CM | POA: Diagnosis not present

## 2017-05-25 DIAGNOSIS — L89152 Pressure ulcer of sacral region, stage 2: Secondary | ICD-10-CM | POA: Diagnosis not present

## 2017-05-25 DIAGNOSIS — Z9181 History of falling: Secondary | ICD-10-CM | POA: Diagnosis not present

## 2017-05-25 DIAGNOSIS — Z7901 Long term (current) use of anticoagulants: Secondary | ICD-10-CM | POA: Diagnosis not present

## 2017-05-25 DIAGNOSIS — E1142 Type 2 diabetes mellitus with diabetic polyneuropathy: Secondary | ICD-10-CM | POA: Diagnosis not present

## 2017-05-27 DIAGNOSIS — I4891 Unspecified atrial fibrillation: Secondary | ICD-10-CM | POA: Diagnosis not present

## 2017-05-27 DIAGNOSIS — L89152 Pressure ulcer of sacral region, stage 2: Secondary | ICD-10-CM | POA: Diagnosis not present

## 2017-05-27 DIAGNOSIS — E1142 Type 2 diabetes mellitus with diabetic polyneuropathy: Secondary | ICD-10-CM | POA: Diagnosis not present

## 2017-05-27 DIAGNOSIS — Z7901 Long term (current) use of anticoagulants: Secondary | ICD-10-CM | POA: Diagnosis not present

## 2017-05-27 DIAGNOSIS — S72001D Fracture of unspecified part of neck of right femur, subsequent encounter for closed fracture with routine healing: Secondary | ICD-10-CM | POA: Diagnosis not present

## 2017-05-27 DIAGNOSIS — Z9181 History of falling: Secondary | ICD-10-CM | POA: Diagnosis not present

## 2017-05-27 DIAGNOSIS — Z96641 Presence of right artificial hip joint: Secondary | ICD-10-CM | POA: Diagnosis not present

## 2017-06-01 DIAGNOSIS — S72001D Fracture of unspecified part of neck of right femur, subsequent encounter for closed fracture with routine healing: Secondary | ICD-10-CM | POA: Diagnosis not present

## 2017-06-01 DIAGNOSIS — E1142 Type 2 diabetes mellitus with diabetic polyneuropathy: Secondary | ICD-10-CM | POA: Diagnosis not present

## 2017-06-01 DIAGNOSIS — Z96641 Presence of right artificial hip joint: Secondary | ICD-10-CM | POA: Diagnosis not present

## 2017-06-01 DIAGNOSIS — Z9181 History of falling: Secondary | ICD-10-CM | POA: Diagnosis not present

## 2017-06-01 DIAGNOSIS — L89152 Pressure ulcer of sacral region, stage 2: Secondary | ICD-10-CM | POA: Diagnosis not present

## 2017-06-01 DIAGNOSIS — Z7901 Long term (current) use of anticoagulants: Secondary | ICD-10-CM | POA: Diagnosis not present

## 2017-06-01 DIAGNOSIS — I4891 Unspecified atrial fibrillation: Secondary | ICD-10-CM | POA: Diagnosis not present

## 2017-06-03 DIAGNOSIS — S72001D Fracture of unspecified part of neck of right femur, subsequent encounter for closed fracture with routine healing: Secondary | ICD-10-CM | POA: Diagnosis not present

## 2017-06-03 DIAGNOSIS — I4891 Unspecified atrial fibrillation: Secondary | ICD-10-CM | POA: Diagnosis not present

## 2017-06-03 DIAGNOSIS — L89152 Pressure ulcer of sacral region, stage 2: Secondary | ICD-10-CM | POA: Diagnosis not present

## 2017-06-03 DIAGNOSIS — Z7901 Long term (current) use of anticoagulants: Secondary | ICD-10-CM | POA: Diagnosis not present

## 2017-06-03 DIAGNOSIS — Z9181 History of falling: Secondary | ICD-10-CM | POA: Diagnosis not present

## 2017-06-03 DIAGNOSIS — Z96641 Presence of right artificial hip joint: Secondary | ICD-10-CM | POA: Diagnosis not present

## 2017-06-03 DIAGNOSIS — E1142 Type 2 diabetes mellitus with diabetic polyneuropathy: Secondary | ICD-10-CM | POA: Diagnosis not present

## 2017-06-04 ENCOUNTER — Other Ambulatory Visit: Payer: Self-pay

## 2017-06-04 NOTE — Patient Outreach (Unsigned)
[name]) Care Management  06/04/17  [name] [birth year] [id number]  Successful outreach completed with patient's husband, Roza Creamer. Patient identification verified. Patient was also present nearby the phone to answer questions.  Patient's husband stated that she has been doing pretty good. He stated that she has been walking around with her walker and is getting around good with no problems. Patient denies any pain or concerns.  Patient's husband stated that she has been taking all of her medicines and he indicated that she has everything that she needs. He stated that he keeps up with all of her medicines.  He stated that everything is alright and they don't go back to see the doctor until sometime in January. He currently denies any needs or concerns.   Patient currently has no other nursing care management needs. Discussed case closure with patient's husband since she is doing so well and he was in agreement. Encouraged to call with any needs or concerns that arise. RNCM provided contact information.  Plan: Will perform case closure and send letter to patient's PCP informing him of closure. Will send to CMA to close case.  [place] R. Jeroline Wolbert, RN, BSN, [place] Management Coordinator [phone]

## 2017-06-08 DIAGNOSIS — S329XXA Fracture of unspecified parts of lumbosacral spine and pelvis, initial encounter for closed fracture: Secondary | ICD-10-CM | POA: Diagnosis not present

## 2017-06-22 ENCOUNTER — Other Ambulatory Visit: Payer: Self-pay | Admitting: *Deleted

## 2017-06-22 ENCOUNTER — Encounter: Payer: Self-pay | Admitting: *Deleted

## 2017-06-22 NOTE — Patient Outreach (Signed)
[facility]) Care Management [place] Telephone Outreach  06/22/2017  [name] [birth year] [id number]  Successful telephone outreach to female person identifying herself as "Rupinder's caregiver," re: [name], 83 y/o female originally referred to [facility] for transition of care after hospitalization September 6-13, 2018 for (R) femur and hip fracture incurred during fall event; patient underwent (R) hip arthroplasty and was discharged from hospital to SNF for rehabilitation; patient self-discharged from SNF AMA.  Patient has history including, but not limited to, HTN/ HLD, AF, COPD, DM, previous CVA, frequent falls, and dementia.  Today, when I explained that I was calling from [place], person answering phone immediately stated that patient's husband/ caregiver is not present in the home and requests that I attempt another phone call later in week, which I agreed to do.  Plan:  Will re-attempt [facility] CM telephone outreach later this week  Oneta Rack, RN, BSN, [place] Care Management  [phone]

## 2017-06-25 ENCOUNTER — Encounter: Payer: Self-pay | Admitting: *Deleted

## 2017-06-25 ENCOUNTER — Other Ambulatory Visit: Payer: Self-pay | Admitting: *Deleted

## 2017-06-25 NOTE — Patient Outreach (Signed)
[facility]) Care Management [place] Telephone Outreach  06/25/2017  ANAIZ QAZI [birth year] [id number]  Successful telephone outreach to Rosanne Sack, spouse/ primary caregiver for Nikola Blackston, 83 y/o female originally referred to [facility] for transition of care after hospitalization September 6-13, 2018 for (R) femur and hip fracture incurred during fall event; patient underwent (R) hip arthroplasty and was discharged from hospital to SNF for rehabilitation; patient self-discharged from SNF AMA.  Patient has history including, but not limited to, HTN/ HLD, AF, COPD, DM, previous CVA, frequent falls, and dementia.  HIPAA/ identity verified with patient's spouse during phone call today.  Explained to patient's spouse that I was calling in place of previously active [facility] RN CCM, [place]; spouse reports that he has not heard from [place] in "some time;" and that he "thought she wasn't going to be coming back" to visit patient.  Explained to patient that I was calling to assess what needs patient may have since they last heard from [place].  Spouse reports today that "everything is going much better," and he denies specific needs around patient's care.  Spouse reports that patient has not had any recent falls, "is walking better," and that "her bedsores have all cleared up."  Spouse reports patient is lying down and unavailable to come to phone, stating that he handles all of patient's "medical affairs."  Reports he has a "regular caregiver" who assists in patient's care on a routine basis.  Spouse denies need for additional home visits/ support through [facility] CM at this time.  I provided spouse with the [facility] CM office number should he/ patient have needs arise in the future, and encouraged patient's spouse to contact [facility] CM if he or patient felt they could benefit from [facility] CM program, and he verbalized understanding and agreement with this plan.  Plan:  Will close  patient's [facility] CM case, as caregiver denies current care coordination needs, and will make patient's PCP and [facility] CMA aware of same.  Oneta Rack, RN, BSN, [facility] Care Management  [phone]

## 2017-06-30 DIAGNOSIS — I4891 Unspecified atrial fibrillation: Secondary | ICD-10-CM | POA: Diagnosis not present

## 2017-06-30 DIAGNOSIS — I639 Cerebral infarction, unspecified: Secondary | ICD-10-CM | POA: Diagnosis not present

## 2017-07-01 DIAGNOSIS — I4891 Unspecified atrial fibrillation: Secondary | ICD-10-CM | POA: Diagnosis not present

## 2017-07-01 DIAGNOSIS — I639 Cerebral infarction, unspecified: Secondary | ICD-10-CM | POA: Diagnosis not present

## 2017-07-02 DIAGNOSIS — I4891 Unspecified atrial fibrillation: Secondary | ICD-10-CM | POA: Diagnosis not present

## 2017-07-02 DIAGNOSIS — I639 Cerebral infarction, unspecified: Secondary | ICD-10-CM | POA: Diagnosis not present

## 2017-07-03 DIAGNOSIS — I4891 Unspecified atrial fibrillation: Secondary | ICD-10-CM | POA: Diagnosis not present

## 2017-07-03 DIAGNOSIS — I639 Cerebral infarction, unspecified: Secondary | ICD-10-CM | POA: Diagnosis not present

## 2017-07-06 DIAGNOSIS — I639 Cerebral infarction, unspecified: Secondary | ICD-10-CM | POA: Diagnosis not present

## 2017-07-06 DIAGNOSIS — I4891 Unspecified atrial fibrillation: Secondary | ICD-10-CM | POA: Diagnosis not present

## 2017-07-07 DIAGNOSIS — I639 Cerebral infarction, unspecified: Secondary | ICD-10-CM | POA: Diagnosis not present

## 2017-07-07 DIAGNOSIS — I4891 Unspecified atrial fibrillation: Secondary | ICD-10-CM | POA: Diagnosis not present

## 2017-07-08 DIAGNOSIS — I639 Cerebral infarction, unspecified: Secondary | ICD-10-CM | POA: Diagnosis not present

## 2017-07-08 DIAGNOSIS — I4891 Unspecified atrial fibrillation: Secondary | ICD-10-CM | POA: Diagnosis not present

## 2017-07-09 DIAGNOSIS — I639 Cerebral infarction, unspecified: Secondary | ICD-10-CM | POA: Diagnosis not present

## 2017-07-09 DIAGNOSIS — I4891 Unspecified atrial fibrillation: Secondary | ICD-10-CM | POA: Diagnosis not present

## 2017-07-10 DIAGNOSIS — I4891 Unspecified atrial fibrillation: Secondary | ICD-10-CM | POA: Diagnosis not present

## 2017-07-10 DIAGNOSIS — I639 Cerebral infarction, unspecified: Secondary | ICD-10-CM | POA: Diagnosis not present

## 2017-07-13 ENCOUNTER — Other Ambulatory Visit: Payer: Self-pay | Admitting: Internal Medicine

## 2017-07-13 DIAGNOSIS — I480 Paroxysmal atrial fibrillation: Secondary | ICD-10-CM

## 2017-07-13 DIAGNOSIS — I639 Cerebral infarction, unspecified: Secondary | ICD-10-CM | POA: Diagnosis not present

## 2017-07-13 DIAGNOSIS — I4891 Unspecified atrial fibrillation: Secondary | ICD-10-CM | POA: Diagnosis not present

## 2017-07-13 MED ORDER — APIXABAN 2.5 MG PO TABS: 2.5000 mg | ORAL_TABLET | Freq: Two times a day (BID) | ORAL | 0 refills | Status: DC

## 2017-07-14 DIAGNOSIS — I639 Cerebral infarction, unspecified: Secondary | ICD-10-CM | POA: Diagnosis not present

## 2017-07-14 DIAGNOSIS — I4891 Unspecified atrial fibrillation: Secondary | ICD-10-CM | POA: Diagnosis not present

## 2017-07-15 DIAGNOSIS — I639 Cerebral infarction, unspecified: Secondary | ICD-10-CM | POA: Diagnosis not present

## 2017-07-15 DIAGNOSIS — I4891 Unspecified atrial fibrillation: Secondary | ICD-10-CM | POA: Diagnosis not present

## 2017-07-16 DIAGNOSIS — I4891 Unspecified atrial fibrillation: Secondary | ICD-10-CM | POA: Diagnosis not present

## 2017-07-16 DIAGNOSIS — I639 Cerebral infarction, unspecified: Secondary | ICD-10-CM | POA: Diagnosis not present

## 2017-07-17 ENCOUNTER — Other Ambulatory Visit: Payer: Self-pay | Admitting: *Deleted

## 2017-07-17 DIAGNOSIS — S329XXA Fracture of unspecified parts of lumbosacral spine and pelvis, initial encounter for closed fracture: Secondary | ICD-10-CM | POA: Diagnosis not present

## 2017-07-17 DIAGNOSIS — I639 Cerebral infarction, unspecified: Secondary | ICD-10-CM | POA: Diagnosis not present

## 2017-07-17 DIAGNOSIS — I4891 Unspecified atrial fibrillation: Secondary | ICD-10-CM | POA: Diagnosis not present

## 2017-07-20 DIAGNOSIS — I639 Cerebral infarction, unspecified: Secondary | ICD-10-CM | POA: Diagnosis not present

## 2017-07-20 DIAGNOSIS — I4891 Unspecified atrial fibrillation: Secondary | ICD-10-CM | POA: Diagnosis not present

## 2017-07-21 DIAGNOSIS — I4891 Unspecified atrial fibrillation: Secondary | ICD-10-CM | POA: Diagnosis not present

## 2017-07-21 DIAGNOSIS — I639 Cerebral infarction, unspecified: Secondary | ICD-10-CM | POA: Diagnosis not present

## 2017-07-21 DIAGNOSIS — S329XXA Fracture of unspecified parts of lumbosacral spine and pelvis, initial encounter for closed fracture: Secondary | ICD-10-CM | POA: Diagnosis not present

## 2017-07-21 DIAGNOSIS — M353 Polymyalgia rheumatica: Secondary | ICD-10-CM | POA: Diagnosis not present

## 2017-07-21 DIAGNOSIS — J4520 Mild intermittent asthma, uncomplicated: Secondary | ICD-10-CM | POA: Diagnosis not present

## 2017-07-21 MED ORDER — VITAMIN D 1000 UNITS PO TABS: 1000.0000 [IU] | ORAL_TABLET | Freq: Every day | ORAL | 2 refills | Status: DC

## 2017-07-22 ENCOUNTER — Ambulatory Visit: Payer: Medicare Other | Admitting: Internal Medicine

## 2017-07-22 DIAGNOSIS — I639 Cerebral infarction, unspecified: Secondary | ICD-10-CM | POA: Diagnosis not present

## 2017-07-22 DIAGNOSIS — I4891 Unspecified atrial fibrillation: Secondary | ICD-10-CM | POA: Diagnosis not present

## 2017-07-23 ENCOUNTER — Telehealth: Payer: Self-pay | Admitting: Internal Medicine

## 2017-07-23 DIAGNOSIS — I639 Cerebral infarction, unspecified: Secondary | ICD-10-CM | POA: Diagnosis not present

## 2017-07-23 DIAGNOSIS — I4891 Unspecified atrial fibrillation: Secondary | ICD-10-CM | POA: Diagnosis not present

## 2017-07-23 NOTE — Telephone Encounter (Signed)
Wants to talk to dr Emmaline Life about getting a bed with rails for pt. She gets up at night and james doesn't always hear her. Please advise

## 2017-07-24 DIAGNOSIS — I4891 Unspecified atrial fibrillation: Secondary | ICD-10-CM | POA: Diagnosis not present

## 2017-07-24 DIAGNOSIS — I639 Cerebral infarction, unspecified: Secondary | ICD-10-CM | POA: Diagnosis not present

## 2017-07-24 NOTE — Telephone Encounter (Signed)
Returned patient. Plan to start working on this issue

## 2017-07-27 DIAGNOSIS — I639 Cerebral infarction, unspecified: Secondary | ICD-10-CM | POA: Diagnosis not present

## 2017-07-27 DIAGNOSIS — I4891 Unspecified atrial fibrillation: Secondary | ICD-10-CM | POA: Diagnosis not present

## 2017-07-28 DIAGNOSIS — I639 Cerebral infarction, unspecified: Secondary | ICD-10-CM | POA: Diagnosis not present

## 2017-07-28 DIAGNOSIS — I4891 Unspecified atrial fibrillation: Secondary | ICD-10-CM | POA: Diagnosis not present

## 2017-07-29 DIAGNOSIS — I639 Cerebral infarction, unspecified: Secondary | ICD-10-CM | POA: Diagnosis not present

## 2017-07-29 DIAGNOSIS — I4891 Unspecified atrial fibrillation: Secondary | ICD-10-CM | POA: Diagnosis not present

## 2017-07-30 DIAGNOSIS — I4891 Unspecified atrial fibrillation: Secondary | ICD-10-CM | POA: Diagnosis not present

## 2017-07-30 DIAGNOSIS — I639 Cerebral infarction, unspecified: Secondary | ICD-10-CM | POA: Diagnosis not present

## 2017-07-31 DIAGNOSIS — I4891 Unspecified atrial fibrillation: Secondary | ICD-10-CM | POA: Diagnosis not present

## 2017-07-31 DIAGNOSIS — I639 Cerebral infarction, unspecified: Secondary | ICD-10-CM | POA: Diagnosis not present

## 2017-08-03 DIAGNOSIS — I4891 Unspecified atrial fibrillation: Secondary | ICD-10-CM | POA: Diagnosis not present

## 2017-08-03 DIAGNOSIS — I639 Cerebral infarction, unspecified: Secondary | ICD-10-CM | POA: Diagnosis not present

## 2017-08-05 DIAGNOSIS — I4891 Unspecified atrial fibrillation: Secondary | ICD-10-CM | POA: Diagnosis not present

## 2017-08-05 DIAGNOSIS — I639 Cerebral infarction, unspecified: Secondary | ICD-10-CM | POA: Diagnosis not present

## 2017-08-06 DIAGNOSIS — I639 Cerebral infarction, unspecified: Secondary | ICD-10-CM | POA: Diagnosis not present

## 2017-08-06 DIAGNOSIS — I4891 Unspecified atrial fibrillation: Secondary | ICD-10-CM | POA: Diagnosis not present

## 2017-08-07 ENCOUNTER — Other Ambulatory Visit: Payer: Self-pay

## 2017-08-07 DIAGNOSIS — E538 Deficiency of other specified B group vitamins: Secondary | ICD-10-CM

## 2017-08-07 DIAGNOSIS — I639 Cerebral infarction, unspecified: Secondary | ICD-10-CM | POA: Diagnosis not present

## 2017-08-07 DIAGNOSIS — I4891 Unspecified atrial fibrillation: Secondary | ICD-10-CM | POA: Diagnosis not present

## 2017-08-07 MED ORDER — VITAMIN B-12 100 MCG PO TABS: 100.0000 ug | ORAL_TABLET | Freq: Every day | ORAL | 3 refills | Status: DC

## 2017-08-08 ENCOUNTER — Encounter (HOSPITAL_COMMUNITY): Payer: Self-pay

## 2017-08-08 ENCOUNTER — Emergency Department (HOSPITAL_COMMUNITY): Payer: Medicare Other

## 2017-08-08 ENCOUNTER — Emergency Department (HOSPITAL_COMMUNITY)
Admission: EM | Admit: 2017-08-08 | Discharge: 2017-08-08 | Disposition: A | Discharge status: Home / Self Care | Payer: Medicare Other | Attending: Emergency Medicine | Admitting: Emergency Medicine

## 2017-08-08 DIAGNOSIS — I130 Hypertensive heart and chronic kidney disease with heart failure and stage 1 through stage 4 chronic kidney disease, or unspecified chronic kidney disease: Secondary | ICD-10-CM | POA: Diagnosis not present

## 2017-08-08 DIAGNOSIS — J449 Chronic obstructive pulmonary disease, unspecified: Secondary | ICD-10-CM | POA: Insufficient documentation

## 2017-08-08 DIAGNOSIS — Z79899 Other long term (current) drug therapy: Secondary | ICD-10-CM | POA: Insufficient documentation

## 2017-08-08 DIAGNOSIS — I5030 Unspecified diastolic (congestive) heart failure: Secondary | ICD-10-CM | POA: Insufficient documentation

## 2017-08-08 DIAGNOSIS — F0390 Unspecified dementia without behavioral disturbance: Secondary | ICD-10-CM | POA: Insufficient documentation

## 2017-08-08 DIAGNOSIS — J40 Bronchitis, not specified as acute or chronic: Secondary | ICD-10-CM | POA: Insufficient documentation

## 2017-08-08 DIAGNOSIS — Z85528 Personal history of other malignant neoplasm of kidney: Secondary | ICD-10-CM | POA: Insufficient documentation

## 2017-08-08 DIAGNOSIS — N183 Chronic kidney disease, stage 3 (moderate): Secondary | ICD-10-CM | POA: Diagnosis not present

## 2017-08-08 DIAGNOSIS — Z7722 Contact with and (suspected) exposure to environmental tobacco smoke (acute) (chronic): Secondary | ICD-10-CM | POA: Insufficient documentation

## 2017-08-08 DIAGNOSIS — Z7982 Long term (current) use of aspirin: Secondary | ICD-10-CM | POA: Insufficient documentation

## 2017-08-08 DIAGNOSIS — R05 Cough: Secondary | ICD-10-CM | POA: Diagnosis not present

## 2017-08-08 MED ORDER — AMOXICILLIN-POT CLAVULANATE 875-125 MG PO TABS
1.0000 | ORAL_TABLET | Freq: Two times a day (BID) | ORAL | 0 refills | Status: DC
Start: 2017-08-08 — End: 2017-10-02

## 2017-08-08 MED ORDER — IPRATROPIUM-ALBUTEROL 0.5-2.5 (3) MG/3ML IN SOLN: 3.0000 mL | RESPIRATORY_TRACT | 1 refills | Status: DC | PRN

## 2017-08-08 NOTE — ED Notes (Signed)
ED Provider at bedside. 

## 2017-08-08 NOTE — Discharge Instructions (Signed)
1.  Use nebulizer machine every 4-6 hours for the next 2 days.  Then use as needed for cough and wheeze. 2.  See your family doctor for recheck as soon as possible. 3.  Return to the emergency department if you develop fever, shortness of breath, changing baseline mental status or other concerning symptoms.

## 2017-08-08 NOTE — ED Provider Notes (Signed)
[facility] DEPT Provider Note   CSN: [id number] Arrival date & time: 08/08/17  1116     History   Chief Complaint Chief Complaint  Patient presents with  . Cough    HPI [name] is a 84 y.o. female.  HPI Patient has had 2 weeks of coughing.  She does have significant dementia.  Patient's husband is the primary historian.  Reports she had some upper respiratory symptoms earlier.  He reports now this is moved down into her chest and she has had a persistent rattling cough.  No specific chest pain.  No fever that is noted.  He reports she did eat a good breakfast today.  No vomiting or diarrhea.  Patient has advanced dementia and is at baseline per her husband.  She is very unsteady with gait and requires close supervision and assistance with walking but no change from baseline.  He reports they have a nebulizer machine but he has been out of solution and not tried it for this illness.  He reports he does have a diagnosis of COPD but has been a non-smoker. Past Medical History:  Diagnosis Date  . A-fib ([place])   . Anemia    anemia of renal disease  . Arthritis    mutiple joints  . Cancer [facility])    Renal neoplasm- survellience via Urology  . CHF (congestive heart failure) ([place])    Diastolic dys-- EF 44% july 2011  . Chronic kidney disease    Stage III- followed by [facility]  . COPD (chronic obstructive pulmonary disease) ([place])   . Dementia, vascular, mixed, with behavioral disturbance 01/31/2016  . Diabetes mellitus   . DIASTOLIC DYSFUNCTION 09/12/4006   Qualifier: Diagnosis of  By: Buelah Manis MD, Lonell Grandchild    . GERD (gastroesophageal reflux disease)   . H/O hiatal hernia   . Hyperlipidemia   . Hypertension   . Left bundle branch block   . NEOPLASM, KIDNEY 03/04/2007   complex right renal cyst; Per patient- Urologist no longer following- no changes.      . Nonischemic cardiomyopathy ([place])   . Peripheral neuropathy   . Pneumonia   . Shortness of  breath    Hx: of with exertion  . Venous insufficiency 02/24/2011    Patient Active Problem List   Diagnosis Date Noted  . Hip fracture, unspecified laterality, closed, initial encounter ([place]) 03/05/2017  . Hip fracture ([place]) 03/05/2017  . Closed subcapital fracture of right femur ([place])   . Vascular dementia with behavior disturbance   . Normocytic anemia 02/23/2017  . High risk medication use 02/23/2017  . Altered mental status 02/23/2017  . Hearing impairment 12/26/2016  . Impaired mobility and ADLs 12/26/2016  . Urinary incontinence in female 12/26/2016  . Cerebellar infarct ([place]) 12/26/2016  . Falls frequently 12/26/2016  . Borderline Low serum vitamin B12 12/26/2016  . Visual impairment in both eyes   . Ovarian cyst 10/28/2016  . DNR (do not resuscitate) 08/22/2016  . Skin lesion of neck 04/18/2016  . Dementia, vascular, mixed, with behavioral disturbance 01/31/2016  . Hematochezia 12/24/2015  . COPD exacerbation ([place]) 09/12/2015  . Constipation 11/27/2014  . Osteoporosis 09/11/2014  . Loss of weight 04/30/2013  . Secondary hyperparathyroidism ([place]) 11/10/2011  . History of anemia 11/10/2011  . Campath-induced atrial fibrillation 02/06/2011  . CKD stage G3b/A2, GFR 30-44 and albumin creatinine ratio 30-299 mg/g (H[facility]) 01/26/2007  . Type 2 diabetes mellitus ([place]) 08/27/2006  . HLD (hyperlipidemia) 08/27/2006  . Essential  hypertension 08/27/2006  . OSTEOARTHRITIS, MULTI SITES 08/27/2006    Past Surgical History:  Procedure Laterality Date  . ABDOMINAL HYSTERECTOMY    . CARDIAC CATHETERIZATION  08/12/2011   heavily calcified L main; LAD with no stenosis; 1st diagonal free of stenosis; L Cfx with no stenosis; OM1/2 without stenosis; PDA without stenosis; RCA without significant stenosis; non-ischemic cardiomyopathy (Dr. Kathy Breach)  . CATARACT EXTRACTION W/ INTRAOCULAR LENS  IMPLANT, BILATERAL    . CHOLECYSTECTOMY  1987  . DILATION AND CURETTAGE OF UTERUS    . HIP  ARTHROPLASTY Right 03/08/2017   Procedure: ARTHROPLASTY BIPOLAR HIP (HEMIARTHROPLASTY);  Surgeon: Paralee Cancel, MD;  Location: [facility] ORS;  Service: Orthopedics;  Laterality: Right;  . LEFT HEART CATHETERIZATION WITH CORONARY ANGIOGRAM N/A 08/12/2011   Procedure: LEFT HEART CATHETERIZATION WITH CORONARY ANGIOGRAM;  Surgeon: Pixie Casino, MD;  Location: [facility] C[facility] LAB;  Service: Cardiovascular;  Laterality: N/A;  . LUMBAR LAMINECTOMY/DECOMPRESSION MICRODISCECTOMY Left 05/12/2013   Procedure: Left Lumbar four-five laminotomy and microdiskectomy;  Surgeon: Hosie Spangle, MD;  Location: [facility] NEURO ORS;  Service: Neurosurgery;  Laterality: Left;  Left Lumbar four-five laminotomy and microdiskectomy  . NM MYOCAR PERF WALL MOTION  05/2011   lexiscan myoview - fixed septal defect r/t LBBB; normal pattern of perfusion in all regions; low risk scan  . TONSILLECTOMY    . TRANSTHORACIC ECHOCARDIOGRAM  09/2011   EF 60-65%; MV mod-severely calcified, mildly thickened MV annulus, mild MR; LA mod dilated; RA mildly dilated    OB History    No data available       Home Medications    Prior to Admission medications   Medication Sig Start Date End Date Taking? Authorizing Provider  albuterol (PROVENTIL HFA;VENTOLIN HFA) 108 (90 Base) MCG/ACT inhaler Inhale 2 puffs into the lungs every 6 (six) hours as needed for wheezing or shortness of breath. 08/08/15   Rosemarie Ax, MD  amoxicillin-clavulanate (AUGMENTIN) 875-125 MG tablet Take 1 tablet by mouth 2 (two) times daily. One po bid x 7 days 08/08/17   Charlesetta Shanks, MD  apixaban (ELIQUIS) 2.5 MG TABS tablet Take 1 tablet (2.5 mg total) by mouth 2 (two) times daily. 07/13/17   Mikell, Jeani Sow, MD  aspirin EC 81 MG tablet Take 81 mg by mouth daily.    [provider]  atorvastatin (LIPITOR) 80 MG tablet take 1 tablet by mouth at bedtime 01/26/17   Mikell, Jeani Sow, MD  calcitRIOL (ROCALTROL) 0.25 MCG capsule take 1 capsule by mouth EVERY MONDAY,  [facility] AND FRIDAY 04/09/17   Mikell, Jeani Sow, MD  carvedilol (COREG) 6.25 MG tablet Take 1 tablet (6.25 mg total) by mouth 2 (two) times daily with a meal. Take 1 tablet by mouth two  times daily with meals 03/14/16   Mikell, Jeani Sow, MD  cholecalciferol (VITAMIN D) 1000 units tablet Take 1 tablet (1,000 Units total) by mouth daily. 07/21/17   Mikell, Jeani Sow, MD  docusate sodium (COLACE) 100 MG capsule Take 2 capsules (200 mg total) by mouth 2 (two) times daily. Reported on 12/20/2015 Patient not taking: Reported on 04/14/2017 08/21/16   Tonette Bihari, MD  ipratropium-albuterol (DUONEB) 0.5-2.5 (3) MG/3ML SOLN Take 3 mLs by nebulization every 4 (four) hours as needed. 08/08/17   Charlesetta Shanks, MD  losartan (COZAAR) 50 MG tablet Take 1 tablet (50 mg total) by mouth daily. Call office for appointment, no refills until seen 06/02/16   Pixie Casino, MD  QUEtiapine (SEROQUEL) 50 MG tablet Take  2 tablets (100 mg total) by mouth at bedtime. Patient not taking: Reported on 04/14/2017 04/09/17   Tonette Bihari, MD  senna-docusate (SENOKOT-S) 8.6-50 MG tablet Take 1 tablet by mouth 2 (two) times daily as needed for mild constipation. Patient not taking: Reported on 04/14/2017 08/21/16   Tonette Bihari, MD  vitamin B-12 (CYANOCOBALAMIN) 100 MCG tablet Take 1 tablet (100 mcg total) by mouth daily. 08/07/17   Tonette Bihari, MD    Family History Family History  Problem Relation Age of Onset  . Heart disease Mother   . Arthritis Mother     Social History Social History   Tobacco Use  . Smoking status: Passive Smoke Exposure - Never Smoker  . Smokeless tobacco: Never Used  Substance Use Topics  . Alcohol use: No  . Drug use: No     Allergies   Patient has no known allergies.   Review of Systems Review of Systems 10 Systems reviewed and are negative for acute change except as noted in the HPI.   Physical Exam Updated Vital Signs BP 113/64 (BP  Location: Right Arm)   Pulse 88   Temp (!) 97.5 F (36.4 C) (Oral)   Resp 20   SpO2 92%   Physical Exam  Constitutional:  Patient is alert and pleasant.  She exhibits dementia based on interactive conversation.  She was wheeled back to the room and transferred to the bed with assistance.  HENT:  Head: Normocephalic and atraumatic.  Nose: Nose normal.  Mouth/Throat: Oropharynx is clear and moist.  Eyes: Conjunctivae and EOM are normal.  Neck: Neck supple.  Cardiovascular:  Heart rate regular to auscultation (patient does have diagnosed atrial fibrillation) no gross rub murmur gallop.  Pulmonary/Chest:  Patient has intermittent very wet cough.  She does not have respiratory distress at rest.  Right lung field has soft breath sounds with occasional expiratory wheeze.  Left lung field has crackles in the midlung field.  With deep inspiration patient begins wet sounding coughing paroxysm.  Abdominal: Soft. She exhibits no distension. There is no tenderness. There is no guarding.  Musculoskeletal:  Patient does not have any peripheral edema.  Calves are soft nontender.  Skin condition excellent.  Neurological: She is alert. No cranial nerve deficit. She exhibits normal muscle tone. Coordination normal.  Patient is frail and somewhat unsteady in her movements but she does stand from the wheelchair shuffle to the bed with assistance and lower herself in the seated position.  She is able to assist did not bring her legs up on the stretcher.  She follows commands to assist me in physical exam.  She is cheerful and interactive.  Skin: Skin is warm and dry.  Psychiatric: She has a normal mood and affect.     ED Treatments / Results  Labs (all labs ordered are listed, but only abnormal results are displayed) Labs Reviewed - No data to display  EKG  EKG Interpretation None       Radiology Dg Chest 2 View  Result Date: 08/08/2017 CLINICAL DATA:  Cough EXAM: CHEST  2 VIEW COMPARISON:   03/05/2017 and prior radiographs FINDINGS: Upper limits normal heart size again noted. There is no evidence of focal airspace disease, pulmonary edema, suspicious pulmonary nodule/mass, pleural effusion, or pneumothorax. No acute bony abnormalities are identified. IMPRESSION: No active cardiopulmonary disease. Electronically Signed   By: Margarette Canada M.D.   On: 08/08/2017 12:35    Procedures Procedures (including critical care time)  Medications Ordered  in ED Medications - No data to display   Initial Impression / Assessment and Plan / ED Course  I have reviewed the triage vital signs and the nursing notes.  Pertinent labs & imaging results that were available during my care of the patient were reviewed by me and considered in my medical decision making (see chart for details).      Final Clinical Impressions(s) / ED Diagnoses   Final diagnoses:  Bronchitis   Patient has known history of dementia and chronic atrial fibrillation.  She has had persistent cough for 2 weeks now.  Her husband reports she does have history of COPD and has a nebulizer at home but no solution present.  Pulmonary exam is positive for crackle in the isolated left mid lung field.  Chest x-ray does not show focal pneumonia, no signs of vascular congestion on the chest x-ray and no peripheral edema.  Patient does not appear short of breath at rest and has been denies that she is appeared short of breath in the usual home situation.  At this time I have low suspicion for CHF exacerbation as underlying etiology for patient's symptoms.  At this time I will opt to treat with antibiotics.  Patient does have productive cough and I suspect secondary infection after initial viral illness.  Patient does have Seroquel listed her medications, Augmentin chosen due to the lack of interactions with Seroquel.  Has been counseled on return precautions. ED Discharge Orders        Ordered    amoxicillin-clavulanate (AUGMENTIN) 875-125 MG  tablet  2 times daily     08/08/17 1630    ipratropium-albuterol (DUONEB) 0.5-2.5 (3) MG/3ML SOLN  Every 4 hours PRN     08/08/17 1630       Charlesetta Shanks, MD 08/08/17 1643

## 2017-08-08 NOTE — ED Triage Notes (Signed)
Pt not feeling well.  Cough runny nose for a while.  No fever.  No n/v.

## 2017-08-10 DIAGNOSIS — I639 Cerebral infarction, unspecified: Secondary | ICD-10-CM | POA: Diagnosis not present

## 2017-08-10 DIAGNOSIS — I4891 Unspecified atrial fibrillation: Secondary | ICD-10-CM | POA: Diagnosis not present

## 2017-08-11 DIAGNOSIS — I4891 Unspecified atrial fibrillation: Secondary | ICD-10-CM | POA: Diagnosis not present

## 2017-08-11 DIAGNOSIS — I639 Cerebral infarction, unspecified: Secondary | ICD-10-CM | POA: Diagnosis not present

## 2017-08-12 DIAGNOSIS — I4891 Unspecified atrial fibrillation: Secondary | ICD-10-CM | POA: Diagnosis not present

## 2017-08-12 DIAGNOSIS — I639 Cerebral infarction, unspecified: Secondary | ICD-10-CM | POA: Diagnosis not present

## 2017-08-13 DIAGNOSIS — I639 Cerebral infarction, unspecified: Secondary | ICD-10-CM | POA: Diagnosis not present

## 2017-08-13 DIAGNOSIS — I4891 Unspecified atrial fibrillation: Secondary | ICD-10-CM | POA: Diagnosis not present

## 2017-08-14 ENCOUNTER — Other Ambulatory Visit: Payer: Self-pay

## 2017-08-14 ENCOUNTER — Ambulatory Visit (INDEPENDENT_AMBULATORY_CARE_PROVIDER_SITE_OTHER): Payer: Medicare Other | Admitting: Internal Medicine

## 2017-08-14 ENCOUNTER — Encounter: Payer: Self-pay | Admitting: Internal Medicine

## 2017-08-14 VITALS — BP 112/66 | HR 70 | Temp 98.1°F | Ht 67.0 in | Wt 131.6 lb

## 2017-08-14 DIAGNOSIS — E1142 Type 2 diabetes mellitus with diabetic polyneuropathy: Secondary | ICD-10-CM | POA: Diagnosis not present

## 2017-08-14 DIAGNOSIS — F0151 Vascular dementia with behavioral disturbance: Secondary | ICD-10-CM | POA: Diagnosis not present

## 2017-08-14 DIAGNOSIS — F01518 Vascular dementia, unspecified severity, with other behavioral disturbance: Secondary | ICD-10-CM

## 2017-08-14 DIAGNOSIS — I639 Cerebral infarction, unspecified: Secondary | ICD-10-CM

## 2017-08-14 DIAGNOSIS — I4821 Permanent atrial fibrillation: Secondary | ICD-10-CM

## 2017-08-14 DIAGNOSIS — Z23 Encounter for immunization: Secondary | ICD-10-CM

## 2017-08-14 DIAGNOSIS — I482 Chronic atrial fibrillation: Secondary | ICD-10-CM | POA: Diagnosis not present

## 2017-08-14 DIAGNOSIS — I4891 Unspecified atrial fibrillation: Secondary | ICD-10-CM | POA: Diagnosis not present

## 2017-08-14 DIAGNOSIS — I10 Essential (primary) hypertension: Secondary | ICD-10-CM | POA: Diagnosis not present

## 2017-08-14 MED ORDER — QUETIAPINE FUMARATE 50 MG PO TABS: 100.0000 mg | ORAL_TABLET | Freq: Every day | ORAL | 1 refills | Status: DC

## 2017-08-14 MED ORDER — LOSARTAN POTASSIUM 25 MG PO TABS: 25.0000 mg | ORAL_TABLET | Freq: Every day | ORAL | 0 refills | Status: DC

## 2017-08-14 NOTE — Progress Notes (Signed)
   [name] Kerrin Mo, MD Phone: [phone]  Reason For Visit: Follow up   # Dementia  -Patient continues to have advancing dementia.  She is not able to follow conversations very well while in the room.  She has been doing well on the Seroquel per has been.  She is taking 100 mg of Seroquel at night her husband to help with agitation and sleeplessness.  Patient has been would like to get a hospital bed as patient spends a lot of time in her bed and he finds it difficult to lift her up out of bed.  #CHRONIC HTN: Current Meds -per husband patient has been taking her coreg and losartan Reports good compliance, took meds today. Tolerating well, w/o complaints. Denies CP, dyspnea, HA, edema, dizziness / lightheadedness  #Afib  -With long-standing history of A. Fib -Has had a couple of falls in the last year -Discussed with husband that Eliquis can have higher risk of causing the brain bleeds especially if patients have falls.  Versus the possibility of patient having a stroke due to A. Fib -He understands indicates if patient starts to have several falls, we will discuss this issue again  #Cough  -Seen on 2/9 for a cough.  Chest x-ray that was completely normal.  Was prescribed Augmentin.  Patient is now doing a lot better.  No concerns.  Past Medical History Reviewed problem list.  Medications- reviewed and updated No additions to family history Social history- patient is a non-smoker  Objective: BP 112/66   Pulse 70   Temp 98.1 F (36.7 C) (Oral)   Ht 5\' 7"  (1.702 m)   Wt 131 lb 9.6 oz (59.7 kg)   SpO2 99%   BMI 20.61 kg/m  Gen: NAD, alert, cooperative with exam Cardio: regular rate and rhythm, S1S2 heard, no murmurs appreciated Pulm: clear to auscultation bilaterally, no wheezes, rhonchi or rales GI: soft, non-tender, non-distended, bowel sounds present, no hepatomegaly, no splenomegaly Extremities: warm, well perfused, No edema, cyanosis or  clubbing;  MSK: Normal gait and station Skin: dry, intact, no rashes or lesions  Diabetic Foot Exam - Simple   Simple Foot Form Diabetic Foot exam was performed with the following findings:  Yes 08/14/2017  9:30 AM  Visual Inspection No deformities, no ulcerations, no other skin breakdown bilaterally:  Yes Sensation Testing Intact to touch and monofilament testing bilaterally:  Yes Pulse Check Posterior Tibialis and Dorsalis pulse intact bilaterally:  Yes Comments      Assessment/Plan: See problem based a/p  Type 2 diabetes mellitus (HCC) Diet controlled  Will obtain A1C at next visit as this is a send out today and patient wants to hold off on blood work Foot exam today  Pneumonia vaccine today  Discussed patient needing eye exam   Dementia, vascular, mixed, with behavioral disturbance Progressive Seroquel every night to help with sleep and agitation  Care team involved  Husband request hospital bed to help with care of patient - order placed   Essential hypertension Blood pressure lower  - Will decrease losartan to 25 mg from 50 mg - consider stopping completely at next visit  - Follow up in 1 month + blood work   Campath-induced atrial fibrillation Currently on Coreg and Elquis  Discussed Eliquis and bleeding risk with falls vs risk of CVA Husband understands, will keep track of falls - to help make this decision  Follow up

## 2017-08-14 NOTE — Patient Instructions (Addendum)
You are doing well today.  You need blood work at your next visit.  We will check on you for your blood pressure at your next visit.  As we have decreased her blood pressure medication.  Mr. Seeber make sure that that he is taking 25 mg of losartan rather than 50 mg.  If she still has 50 mg tablets cut this in half.  Please follow-up in 1 month.  Please make sure to have her eye exam done as well.

## 2017-08-17 DIAGNOSIS — S329XXA Fracture of unspecified parts of lumbosacral spine and pelvis, initial encounter for closed fracture: Secondary | ICD-10-CM | POA: Diagnosis not present

## 2017-08-17 NOTE — Assessment & Plan Note (Addendum)
Diet controlled  Will obtain A1C at next visit as this is a send out today and patient wants to hold off on blood work Foot exam today  Pneumonia vaccine today  Discussed patient needing eye exam

## 2017-08-17 NOTE — Assessment & Plan Note (Signed)
Currently on Coreg and Elquis  Discussed Eliquis and bleeding risk with falls vs risk of CVA Husband understands, will keep track of falls - to help make this decision  Follow up

## 2017-08-17 NOTE — Assessment & Plan Note (Signed)
Progressive Seroquel every night to help with sleep and agitation  Care team involved  Husband request hospital bed to help with care of patient - order placed

## 2017-08-17 NOTE — Assessment & Plan Note (Signed)
Blood pressure lower  - Will decrease losartan to 25 mg from 50 mg - consider stopping completely at next visit  - Follow up in 1 month + blood work

## 2017-08-17 NOTE — Assessment & Plan Note (Deleted)
Progressive Seroquel every night to help with sleep and agitation  Care team involved  Husband request hospital bed to help with care of patient - order placed

## 2017-08-18 DIAGNOSIS — I4891 Unspecified atrial fibrillation: Secondary | ICD-10-CM | POA: Diagnosis not present

## 2017-08-18 DIAGNOSIS — I639 Cerebral infarction, unspecified: Secondary | ICD-10-CM | POA: Diagnosis not present

## 2017-08-19 DIAGNOSIS — I4891 Unspecified atrial fibrillation: Secondary | ICD-10-CM | POA: Diagnosis not present

## 2017-08-19 DIAGNOSIS — I639 Cerebral infarction, unspecified: Secondary | ICD-10-CM | POA: Diagnosis not present

## 2017-08-20 DIAGNOSIS — I639 Cerebral infarction, unspecified: Secondary | ICD-10-CM | POA: Diagnosis not present

## 2017-08-20 DIAGNOSIS — I4891 Unspecified atrial fibrillation: Secondary | ICD-10-CM | POA: Diagnosis not present

## 2017-08-21 ENCOUNTER — Other Ambulatory Visit: Payer: Self-pay | Admitting: *Deleted

## 2017-08-21 DIAGNOSIS — I4891 Unspecified atrial fibrillation: Secondary | ICD-10-CM | POA: Diagnosis not present

## 2017-08-21 DIAGNOSIS — I639 Cerebral infarction, unspecified: Secondary | ICD-10-CM | POA: Diagnosis not present

## 2017-08-21 MED ORDER — ATORVASTATIN CALCIUM 80 MG PO TABS: 80.0000 mg | ORAL_TABLET | Freq: Every day | ORAL | 1 refills | Status: DC

## 2017-08-23 DIAGNOSIS — R2689 Other abnormalities of gait and mobility: Secondary | ICD-10-CM | POA: Diagnosis not present

## 2017-08-23 DIAGNOSIS — M810 Age-related osteoporosis without current pathological fracture: Secondary | ICD-10-CM | POA: Diagnosis not present

## 2017-08-23 DIAGNOSIS — M129 Arthropathy, unspecified: Secondary | ICD-10-CM | POA: Diagnosis not present

## 2017-08-23 DIAGNOSIS — M545 Low back pain: Secondary | ICD-10-CM | POA: Diagnosis not present

## 2017-08-24 DIAGNOSIS — I4891 Unspecified atrial fibrillation: Secondary | ICD-10-CM | POA: Diagnosis not present

## 2017-08-24 DIAGNOSIS — I639 Cerebral infarction, unspecified: Secondary | ICD-10-CM | POA: Diagnosis not present

## 2017-08-25 DIAGNOSIS — I4891 Unspecified atrial fibrillation: Secondary | ICD-10-CM | POA: Diagnosis not present

## 2017-08-25 DIAGNOSIS — I639 Cerebral infarction, unspecified: Secondary | ICD-10-CM | POA: Diagnosis not present

## 2017-08-26 DIAGNOSIS — I4891 Unspecified atrial fibrillation: Secondary | ICD-10-CM | POA: Diagnosis not present

## 2017-08-26 DIAGNOSIS — I639 Cerebral infarction, unspecified: Secondary | ICD-10-CM | POA: Diagnosis not present

## 2017-08-27 DIAGNOSIS — I4891 Unspecified atrial fibrillation: Secondary | ICD-10-CM | POA: Diagnosis not present

## 2017-08-27 DIAGNOSIS — I639 Cerebral infarction, unspecified: Secondary | ICD-10-CM | POA: Diagnosis not present

## 2017-08-28 ENCOUNTER — Other Ambulatory Visit: Payer: Self-pay | Admitting: *Deleted

## 2017-08-28 DIAGNOSIS — I639 Cerebral infarction, unspecified: Secondary | ICD-10-CM | POA: Diagnosis not present

## 2017-08-28 DIAGNOSIS — I4891 Unspecified atrial fibrillation: Secondary | ICD-10-CM | POA: Diagnosis not present

## 2017-08-28 DIAGNOSIS — I480 Paroxysmal atrial fibrillation: Secondary | ICD-10-CM

## 2017-08-31 MED ORDER — APIXABAN 2.5 MG PO TABS: 2.5000 mg | ORAL_TABLET | Freq: Two times a day (BID) | ORAL | 0 refills | Status: DC

## 2017-09-01 DIAGNOSIS — I4891 Unspecified atrial fibrillation: Secondary | ICD-10-CM | POA: Diagnosis not present

## 2017-09-01 DIAGNOSIS — I639 Cerebral infarction, unspecified: Secondary | ICD-10-CM | POA: Diagnosis not present

## 2017-09-02 DIAGNOSIS — I4891 Unspecified atrial fibrillation: Secondary | ICD-10-CM | POA: Diagnosis not present

## 2017-09-02 DIAGNOSIS — I639 Cerebral infarction, unspecified: Secondary | ICD-10-CM | POA: Diagnosis not present

## 2017-09-03 DIAGNOSIS — I4891 Unspecified atrial fibrillation: Secondary | ICD-10-CM | POA: Diagnosis not present

## 2017-09-03 DIAGNOSIS — I639 Cerebral infarction, unspecified: Secondary | ICD-10-CM | POA: Diagnosis not present

## 2017-09-04 DIAGNOSIS — I4891 Unspecified atrial fibrillation: Secondary | ICD-10-CM | POA: Diagnosis not present

## 2017-09-04 DIAGNOSIS — I639 Cerebral infarction, unspecified: Secondary | ICD-10-CM | POA: Diagnosis not present

## 2017-09-07 DIAGNOSIS — N183 Chronic kidney disease, stage 3 (moderate): Secondary | ICD-10-CM | POA: Diagnosis not present

## 2017-09-07 DIAGNOSIS — N2581 Secondary hyperparathyroidism of renal origin: Secondary | ICD-10-CM | POA: Diagnosis not present

## 2017-09-07 DIAGNOSIS — I639 Cerebral infarction, unspecified: Secondary | ICD-10-CM | POA: Diagnosis not present

## 2017-09-07 DIAGNOSIS — I4891 Unspecified atrial fibrillation: Secondary | ICD-10-CM | POA: Diagnosis not present

## 2017-09-07 DIAGNOSIS — N189 Chronic kidney disease, unspecified: Secondary | ICD-10-CM | POA: Diagnosis not present

## 2017-09-08 DIAGNOSIS — I4891 Unspecified atrial fibrillation: Secondary | ICD-10-CM | POA: Diagnosis not present

## 2017-09-08 DIAGNOSIS — I639 Cerebral infarction, unspecified: Secondary | ICD-10-CM | POA: Diagnosis not present

## 2017-09-09 DIAGNOSIS — I639 Cerebral infarction, unspecified: Secondary | ICD-10-CM | POA: Diagnosis not present

## 2017-09-09 DIAGNOSIS — I4891 Unspecified atrial fibrillation: Secondary | ICD-10-CM | POA: Diagnosis not present

## 2017-09-10 DIAGNOSIS — I639 Cerebral infarction, unspecified: Secondary | ICD-10-CM | POA: Diagnosis not present

## 2017-09-10 DIAGNOSIS — I4891 Unspecified atrial fibrillation: Secondary | ICD-10-CM | POA: Diagnosis not present

## 2017-09-11 DIAGNOSIS — I639 Cerebral infarction, unspecified: Secondary | ICD-10-CM | POA: Diagnosis not present

## 2017-09-11 DIAGNOSIS — I4891 Unspecified atrial fibrillation: Secondary | ICD-10-CM | POA: Diagnosis not present

## 2017-09-14 DIAGNOSIS — I639 Cerebral infarction, unspecified: Secondary | ICD-10-CM | POA: Diagnosis not present

## 2017-09-14 DIAGNOSIS — I4891 Unspecified atrial fibrillation: Secondary | ICD-10-CM | POA: Diagnosis not present

## 2017-09-15 DIAGNOSIS — I4891 Unspecified atrial fibrillation: Secondary | ICD-10-CM | POA: Diagnosis not present

## 2017-09-15 DIAGNOSIS — I639 Cerebral infarction, unspecified: Secondary | ICD-10-CM | POA: Diagnosis not present

## 2017-09-16 DIAGNOSIS — I639 Cerebral infarction, unspecified: Secondary | ICD-10-CM | POA: Diagnosis not present

## 2017-09-16 DIAGNOSIS — I4891 Unspecified atrial fibrillation: Secondary | ICD-10-CM | POA: Diagnosis not present

## 2017-09-17 DIAGNOSIS — I4891 Unspecified atrial fibrillation: Secondary | ICD-10-CM | POA: Diagnosis not present

## 2017-09-17 DIAGNOSIS — I639 Cerebral infarction, unspecified: Secondary | ICD-10-CM | POA: Diagnosis not present

## 2017-09-18 DIAGNOSIS — I639 Cerebral infarction, unspecified: Secondary | ICD-10-CM | POA: Diagnosis not present

## 2017-09-18 DIAGNOSIS — I4891 Unspecified atrial fibrillation: Secondary | ICD-10-CM | POA: Diagnosis not present

## 2017-09-20 DIAGNOSIS — R2689 Other abnormalities of gait and mobility: Secondary | ICD-10-CM | POA: Diagnosis not present

## 2017-09-21 DIAGNOSIS — I639 Cerebral infarction, unspecified: Secondary | ICD-10-CM | POA: Diagnosis not present

## 2017-09-21 DIAGNOSIS — I4891 Unspecified atrial fibrillation: Secondary | ICD-10-CM | POA: Diagnosis not present

## 2017-09-22 DIAGNOSIS — I4891 Unspecified atrial fibrillation: Secondary | ICD-10-CM | POA: Diagnosis not present

## 2017-09-22 DIAGNOSIS — I639 Cerebral infarction, unspecified: Secondary | ICD-10-CM | POA: Diagnosis not present

## 2017-09-23 DIAGNOSIS — I4891 Unspecified atrial fibrillation: Secondary | ICD-10-CM | POA: Diagnosis not present

## 2017-09-23 DIAGNOSIS — I639 Cerebral infarction, unspecified: Secondary | ICD-10-CM | POA: Diagnosis not present

## 2017-09-24 DIAGNOSIS — I4891 Unspecified atrial fibrillation: Secondary | ICD-10-CM | POA: Diagnosis not present

## 2017-09-24 DIAGNOSIS — I639 Cerebral infarction, unspecified: Secondary | ICD-10-CM | POA: Diagnosis not present

## 2017-09-24 DIAGNOSIS — M353 Polymyalgia rheumatica: Secondary | ICD-10-CM | POA: Diagnosis not present

## 2017-09-24 DIAGNOSIS — S329XXA Fracture of unspecified parts of lumbosacral spine and pelvis, initial encounter for closed fracture: Secondary | ICD-10-CM | POA: Diagnosis not present

## 2017-09-25 DIAGNOSIS — N183 Chronic kidney disease, stage 3 (moderate): Secondary | ICD-10-CM | POA: Diagnosis not present

## 2017-09-25 DIAGNOSIS — I639 Cerebral infarction, unspecified: Secondary | ICD-10-CM | POA: Diagnosis not present

## 2017-09-25 DIAGNOSIS — I4891 Unspecified atrial fibrillation: Secondary | ICD-10-CM | POA: Diagnosis not present

## 2017-09-25 DIAGNOSIS — E1122 Type 2 diabetes mellitus with diabetic chronic kidney disease: Secondary | ICD-10-CM | POA: Diagnosis not present

## 2017-09-25 DIAGNOSIS — I129 Hypertensive chronic kidney disease with stage 1 through stage 4 chronic kidney disease, or unspecified chronic kidney disease: Secondary | ICD-10-CM | POA: Diagnosis not present

## 2017-09-25 DIAGNOSIS — N2581 Secondary hyperparathyroidism of renal origin: Secondary | ICD-10-CM | POA: Diagnosis not present

## 2017-09-25 DIAGNOSIS — D631 Anemia in chronic kidney disease: Secondary | ICD-10-CM | POA: Diagnosis not present

## 2017-09-28 DIAGNOSIS — I639 Cerebral infarction, unspecified: Secondary | ICD-10-CM | POA: Diagnosis not present

## 2017-09-28 DIAGNOSIS — E119 Type 2 diabetes mellitus without complications: Secondary | ICD-10-CM | POA: Diagnosis not present

## 2017-09-28 DIAGNOSIS — H26491 Other secondary cataract, right eye: Secondary | ICD-10-CM | POA: Diagnosis not present

## 2017-09-28 DIAGNOSIS — I4891 Unspecified atrial fibrillation: Secondary | ICD-10-CM | POA: Diagnosis not present

## 2017-09-29 DIAGNOSIS — I639 Cerebral infarction, unspecified: Secondary | ICD-10-CM | POA: Diagnosis not present

## 2017-09-29 DIAGNOSIS — I4891 Unspecified atrial fibrillation: Secondary | ICD-10-CM | POA: Diagnosis not present

## 2017-09-30 DIAGNOSIS — I4891 Unspecified atrial fibrillation: Secondary | ICD-10-CM | POA: Diagnosis not present

## 2017-09-30 DIAGNOSIS — I639 Cerebral infarction, unspecified: Secondary | ICD-10-CM | POA: Diagnosis not present

## 2017-10-01 DIAGNOSIS — I4891 Unspecified atrial fibrillation: Secondary | ICD-10-CM | POA: Diagnosis not present

## 2017-10-01 DIAGNOSIS — I639 Cerebral infarction, unspecified: Secondary | ICD-10-CM | POA: Diagnosis not present

## 2017-10-02 ENCOUNTER — Encounter: Payer: Self-pay | Admitting: Internal Medicine

## 2017-10-02 ENCOUNTER — Ambulatory Visit (INDEPENDENT_AMBULATORY_CARE_PROVIDER_SITE_OTHER): Payer: Medicare Other | Admitting: Internal Medicine

## 2017-10-02 ENCOUNTER — Other Ambulatory Visit: Payer: Self-pay

## 2017-10-02 VITALS — BP 120/58 | HR 73 | Temp 97.4°F | Wt 125.0 lb

## 2017-10-02 DIAGNOSIS — I4891 Unspecified atrial fibrillation: Secondary | ICD-10-CM | POA: Diagnosis not present

## 2017-10-02 DIAGNOSIS — E7800 Pure hypercholesterolemia, unspecified: Secondary | ICD-10-CM

## 2017-10-02 DIAGNOSIS — F01518 Vascular dementia, unspecified severity, with other behavioral disturbance: Secondary | ICD-10-CM

## 2017-10-02 DIAGNOSIS — Z862 Personal history of diseases of the blood and blood-forming organs and certain disorders involving the immune mechanism: Secondary | ICD-10-CM | POA: Diagnosis not present

## 2017-10-02 DIAGNOSIS — I480 Paroxysmal atrial fibrillation: Secondary | ICD-10-CM | POA: Diagnosis not present

## 2017-10-02 DIAGNOSIS — I639 Cerebral infarction, unspecified: Secondary | ICD-10-CM | POA: Diagnosis not present

## 2017-10-02 DIAGNOSIS — E538 Deficiency of other specified B group vitamins: Secondary | ICD-10-CM

## 2017-10-02 DIAGNOSIS — F0151 Vascular dementia with behavioral disturbance: Secondary | ICD-10-CM

## 2017-10-02 DIAGNOSIS — I10 Essential (primary) hypertension: Secondary | ICD-10-CM

## 2017-10-02 MED ORDER — CALCITRIOL 0.25 MCG PO CAPS: ORAL_CAPSULE | ORAL | 1 refills | Status: DC

## 2017-10-02 MED ORDER — VITAMIN D 1000 UNITS PO TABS: 1000.0000 [IU] | ORAL_TABLET | Freq: Every day | ORAL | 2 refills | Status: DC

## 2017-10-02 MED ORDER — VITAMIN B-12 100 MCG PO TABS: 100.0000 ug | ORAL_TABLET | Freq: Every day | ORAL | 3 refills | Status: DC

## 2017-10-02 NOTE — Patient Instructions (Addendum)
I am going to stop several of your medications. -I want you to stop taking atorvastatin, aspirin, losartan as you do not need to be on these medications anymore.  Please use your printed list that I am giving you to discuss with the pharmacy which medications you need to be getting in the future.  Please follow-up for labs in 1 week.  Please follow-up in 1-2 months.

## 2017-10-02 NOTE — Progress Notes (Signed)
   [facility] Kerrin Mo, MD Phone: [phone]  Reason For Visit: Follow up   #Mixed dementia  -Patient has become progressively worse in her dementia, she is quite advanced at this point.  Her husband has been able to cope with a home health aide 5 days a week.  He is still giving her the Seroquel at night to help keep her calm he does not do this every night but when she gets agitated at night he will use the Seroquel.  HTN/HLD #Given patient's advancing age and significant dementia will stop her losartan and Lipitor as this is unlikely to have much benefit at her age and morbidity.  #PAF -patient with a history of chronic A. fib still on Eliquis and a small dose of Coreg.  Will continue this current medication regimen as patient has been doing well on it.  No history of falls, bruising, bleeding recently.   Past Medical History Reviewed problem list.  Medications- reviewed and updated No additions to family history Social history- patient is a nonsmoker  Objective: BP (!) 120/58   Pulse 73   Temp (!) 97.4 F (36.3 C) (Oral)   Wt 125 lb (56.7 kg)   SpO2 99%   BMI 19.58 kg/m  Gen: NAD, alert, cooperative with exam Cardio: regular rate and rhythm, S1S2 heard, no murmurs appreciated Pulm: clear to auscultation bilaterally, no wheezes, rhonchi or rales GI: soft, non-tender, non-distended, bowel sounds present, no hepatomegaly, no splenomegaly Skin: dry, intact, no rashes or lesions    Assessment/Plan: See problem based a/p  Dementia, vascular, mixed, with behavioral disturbance History of mixed dementia -Continue Seroquel for agitation as needed -Was able to obtain hospital bed Husband feels like he continues to be able to cope well with circumstances -Follow up in June or 3 months  HLD (hyperlipidemia) Will stop atorvastatin and aspirin at this point.  Patient is likely to get very little benefit given her advanced dementia  Essential  hypertension Blood pressure well controlled  will stop losartan   Campath-induced atrial fibrillation Continue on Eliquis and Coreg for now Continue to weigh the benefits versus the risks of being on Eliquis  History of anemia History of anemia likely due to CKD Recheck CBC

## 2017-10-05 DIAGNOSIS — I639 Cerebral infarction, unspecified: Secondary | ICD-10-CM | POA: Diagnosis not present

## 2017-10-05 DIAGNOSIS — I4891 Unspecified atrial fibrillation: Secondary | ICD-10-CM | POA: Diagnosis not present

## 2017-10-06 ENCOUNTER — Encounter: Payer: Self-pay | Admitting: Internal Medicine

## 2017-10-06 ENCOUNTER — Other Ambulatory Visit: Payer: Medicare Other

## 2017-10-06 DIAGNOSIS — I639 Cerebral infarction, unspecified: Secondary | ICD-10-CM | POA: Diagnosis not present

## 2017-10-06 DIAGNOSIS — N183 Chronic kidney disease, stage 3 (moderate): Secondary | ICD-10-CM | POA: Diagnosis not present

## 2017-10-06 DIAGNOSIS — I10 Essential (primary) hypertension: Secondary | ICD-10-CM

## 2017-10-06 DIAGNOSIS — I4891 Unspecified atrial fibrillation: Secondary | ICD-10-CM | POA: Diagnosis not present

## 2017-10-06 DIAGNOSIS — E118 Type 2 diabetes mellitus with unspecified complications: Secondary | ICD-10-CM | POA: Diagnosis not present

## 2017-10-06 DIAGNOSIS — N1832 Chronic kidney disease, stage 3b: Secondary | ICD-10-CM

## 2017-10-06 NOTE — Assessment & Plan Note (Signed)
History of mixed dementia -Continue Seroquel for agitation as needed -Was able to obtain hospital bed Husband feels like he continues to be able to cope well with circumstances -Follow up in June or 3 months

## 2017-10-06 NOTE — Assessment & Plan Note (Signed)
Continue on Eliquis and Coreg for now Continue to weigh the benefits versus the risks of being on Eliquis

## 2017-10-06 NOTE — Assessment & Plan Note (Signed)
Blood pressure well controlled  will stop losartan

## 2017-10-06 NOTE — Assessment & Plan Note (Addendum)
Will stop atorvastatin and aspirin at this point.  Patient is likely to get very little benefit given her advanced dementia

## 2017-10-06 NOTE — Assessment & Plan Note (Signed)
History of anemia likely due to CKD Recheck CBC

## 2017-10-07 DIAGNOSIS — I4891 Unspecified atrial fibrillation: Secondary | ICD-10-CM | POA: Diagnosis not present

## 2017-10-07 DIAGNOSIS — I639 Cerebral infarction, unspecified: Secondary | ICD-10-CM | POA: Diagnosis not present

## 2017-10-07 LAB — BASIC METABOLIC PANEL
BUN / CREAT RATIO: 20 (ref 12–28)
BUN: 35 mg/dL — ABNORMAL HIGH (ref 8–27)
CHLORIDE: 105 mmol/L (ref 96–106)
CO2: 23 mmol/L (ref 20–29)
Calcium: 9.2 mg/dL (ref 8.7–10.3)
Creatinine, Ser: 1.73 mg/dL — ABNORMAL HIGH (ref 0.57–1.00)
GFR calc Af Amer: 30 mL/min/{1.73_m2} — ABNORMAL LOW (ref >59)
GFR calc non Af Amer: 26 mL/min/{1.73_m2} — ABNORMAL LOW (ref >59)
GLUCOSE: 107 mg/dL — AB (ref 65–99)
POTASSIUM: 4.4 mmol/L (ref 3.5–5.2)
SODIUM: 141 mmol/L (ref 134–144)

## 2017-10-07 LAB — CBC
HEMOGLOBIN: 10 g/dL — AB (ref 11.1–15.9)
Hematocrit: 32.7 % — ABNORMAL LOW (ref 34.0–46.6)
MCH: 29.9 pg (ref 26.6–33.0)
MCHC: 30.6 g/dL — AB (ref 31.5–35.7)
MCV: 98 fL — ABNORMAL HIGH (ref 79–97)
PLATELETS: 206 10*3/uL (ref 150–379)
RBC: 3.35 x10E6/uL — AB (ref 3.77–5.28)
RDW: 17.4 % — ABNORMAL HIGH (ref 12.3–15.4)
WBC: 6.2 10*3/uL (ref 3.4–10.8)

## 2017-10-08 ENCOUNTER — Encounter: Payer: Self-pay | Admitting: Internal Medicine

## 2017-10-08 DIAGNOSIS — I639 Cerebral infarction, unspecified: Secondary | ICD-10-CM | POA: Diagnosis not present

## 2017-10-08 DIAGNOSIS — I4891 Unspecified atrial fibrillation: Secondary | ICD-10-CM | POA: Diagnosis not present

## 2017-10-09 DIAGNOSIS — I4891 Unspecified atrial fibrillation: Secondary | ICD-10-CM | POA: Diagnosis not present

## 2017-10-09 DIAGNOSIS — I639 Cerebral infarction, unspecified: Secondary | ICD-10-CM | POA: Diagnosis not present

## 2017-10-12 DIAGNOSIS — I639 Cerebral infarction, unspecified: Secondary | ICD-10-CM | POA: Diagnosis not present

## 2017-10-12 DIAGNOSIS — I4891 Unspecified atrial fibrillation: Secondary | ICD-10-CM | POA: Diagnosis not present

## 2017-10-13 DIAGNOSIS — I639 Cerebral infarction, unspecified: Secondary | ICD-10-CM | POA: Diagnosis not present

## 2017-10-13 DIAGNOSIS — I4891 Unspecified atrial fibrillation: Secondary | ICD-10-CM | POA: Diagnosis not present

## 2017-10-14 DIAGNOSIS — I4891 Unspecified atrial fibrillation: Secondary | ICD-10-CM | POA: Diagnosis not present

## 2017-10-14 DIAGNOSIS — I639 Cerebral infarction, unspecified: Secondary | ICD-10-CM | POA: Diagnosis not present

## 2017-10-15 DIAGNOSIS — I4891 Unspecified atrial fibrillation: Secondary | ICD-10-CM | POA: Diagnosis not present

## 2017-10-15 DIAGNOSIS — I639 Cerebral infarction, unspecified: Secondary | ICD-10-CM | POA: Diagnosis not present

## 2017-10-16 DIAGNOSIS — I639 Cerebral infarction, unspecified: Secondary | ICD-10-CM | POA: Diagnosis not present

## 2017-10-16 DIAGNOSIS — I4891 Unspecified atrial fibrillation: Secondary | ICD-10-CM | POA: Diagnosis not present

## 2017-10-19 DIAGNOSIS — I4891 Unspecified atrial fibrillation: Secondary | ICD-10-CM | POA: Diagnosis not present

## 2017-10-19 DIAGNOSIS — I639 Cerebral infarction, unspecified: Secondary | ICD-10-CM | POA: Diagnosis not present

## 2017-10-20 DIAGNOSIS — I4891 Unspecified atrial fibrillation: Secondary | ICD-10-CM | POA: Diagnosis not present

## 2017-10-20 DIAGNOSIS — I639 Cerebral infarction, unspecified: Secondary | ICD-10-CM | POA: Diagnosis not present

## 2017-10-21 DIAGNOSIS — R2689 Other abnormalities of gait and mobility: Secondary | ICD-10-CM | POA: Diagnosis not present

## 2017-10-21 DIAGNOSIS — I639 Cerebral infarction, unspecified: Secondary | ICD-10-CM | POA: Diagnosis not present

## 2017-10-21 DIAGNOSIS — I4891 Unspecified atrial fibrillation: Secondary | ICD-10-CM | POA: Diagnosis not present

## 2017-10-22 DIAGNOSIS — I639 Cerebral infarction, unspecified: Secondary | ICD-10-CM | POA: Diagnosis not present

## 2017-10-22 DIAGNOSIS — I4891 Unspecified atrial fibrillation: Secondary | ICD-10-CM | POA: Diagnosis not present

## 2017-10-23 DIAGNOSIS — I4891 Unspecified atrial fibrillation: Secondary | ICD-10-CM | POA: Diagnosis not present

## 2017-10-23 DIAGNOSIS — I639 Cerebral infarction, unspecified: Secondary | ICD-10-CM | POA: Diagnosis not present

## 2017-10-26 DIAGNOSIS — I639 Cerebral infarction, unspecified: Secondary | ICD-10-CM | POA: Diagnosis not present

## 2017-10-26 DIAGNOSIS — I4891 Unspecified atrial fibrillation: Secondary | ICD-10-CM | POA: Diagnosis not present

## 2017-10-27 DIAGNOSIS — I4891 Unspecified atrial fibrillation: Secondary | ICD-10-CM | POA: Diagnosis not present

## 2017-10-27 DIAGNOSIS — I639 Cerebral infarction, unspecified: Secondary | ICD-10-CM | POA: Diagnosis not present

## 2017-10-28 DIAGNOSIS — I4891 Unspecified atrial fibrillation: Secondary | ICD-10-CM | POA: Diagnosis not present

## 2017-10-28 DIAGNOSIS — M353 Polymyalgia rheumatica: Secondary | ICD-10-CM | POA: Diagnosis not present

## 2017-10-28 DIAGNOSIS — S329XXA Fracture of unspecified parts of lumbosacral spine and pelvis, initial encounter for closed fracture: Secondary | ICD-10-CM | POA: Diagnosis not present

## 2017-10-28 DIAGNOSIS — I639 Cerebral infarction, unspecified: Secondary | ICD-10-CM | POA: Diagnosis not present

## 2017-10-29 DIAGNOSIS — I639 Cerebral infarction, unspecified: Secondary | ICD-10-CM | POA: Diagnosis not present

## 2017-10-29 DIAGNOSIS — I4891 Unspecified atrial fibrillation: Secondary | ICD-10-CM | POA: Diagnosis not present

## 2017-10-30 DIAGNOSIS — I639 Cerebral infarction, unspecified: Secondary | ICD-10-CM | POA: Diagnosis not present

## 2017-10-30 DIAGNOSIS — I4891 Unspecified atrial fibrillation: Secondary | ICD-10-CM | POA: Diagnosis not present

## 2017-11-02 DIAGNOSIS — I639 Cerebral infarction, unspecified: Secondary | ICD-10-CM | POA: Diagnosis not present

## 2017-11-02 DIAGNOSIS — I4891 Unspecified atrial fibrillation: Secondary | ICD-10-CM | POA: Diagnosis not present

## 2017-11-03 DIAGNOSIS — I639 Cerebral infarction, unspecified: Secondary | ICD-10-CM | POA: Diagnosis not present

## 2017-11-03 DIAGNOSIS — I4891 Unspecified atrial fibrillation: Secondary | ICD-10-CM | POA: Diagnosis not present

## 2017-11-04 DIAGNOSIS — I4891 Unspecified atrial fibrillation: Secondary | ICD-10-CM | POA: Diagnosis not present

## 2017-11-04 DIAGNOSIS — I639 Cerebral infarction, unspecified: Secondary | ICD-10-CM | POA: Diagnosis not present

## 2017-11-05 DIAGNOSIS — I4891 Unspecified atrial fibrillation: Secondary | ICD-10-CM | POA: Diagnosis not present

## 2017-11-05 DIAGNOSIS — I639 Cerebral infarction, unspecified: Secondary | ICD-10-CM | POA: Diagnosis not present

## 2017-11-06 DIAGNOSIS — I4891 Unspecified atrial fibrillation: Secondary | ICD-10-CM | POA: Diagnosis not present

## 2017-11-06 DIAGNOSIS — I639 Cerebral infarction, unspecified: Secondary | ICD-10-CM | POA: Diagnosis not present

## 2017-11-09 DIAGNOSIS — I4891 Unspecified atrial fibrillation: Secondary | ICD-10-CM | POA: Diagnosis not present

## 2017-11-09 DIAGNOSIS — I639 Cerebral infarction, unspecified: Secondary | ICD-10-CM | POA: Diagnosis not present

## 2017-11-10 DIAGNOSIS — I639 Cerebral infarction, unspecified: Secondary | ICD-10-CM | POA: Diagnosis not present

## 2017-11-10 DIAGNOSIS — I4891 Unspecified atrial fibrillation: Secondary | ICD-10-CM | POA: Diagnosis not present

## 2017-11-11 DIAGNOSIS — I639 Cerebral infarction, unspecified: Secondary | ICD-10-CM | POA: Diagnosis not present

## 2017-11-11 DIAGNOSIS — S329XXA Fracture of unspecified parts of lumbosacral spine and pelvis, initial encounter for closed fracture: Secondary | ICD-10-CM | POA: Diagnosis not present

## 2017-11-11 DIAGNOSIS — I4891 Unspecified atrial fibrillation: Secondary | ICD-10-CM | POA: Diagnosis not present

## 2017-11-11 DIAGNOSIS — M353 Polymyalgia rheumatica: Secondary | ICD-10-CM | POA: Diagnosis not present

## 2017-11-12 DIAGNOSIS — I4891 Unspecified atrial fibrillation: Secondary | ICD-10-CM | POA: Diagnosis not present

## 2017-11-12 DIAGNOSIS — I639 Cerebral infarction, unspecified: Secondary | ICD-10-CM | POA: Diagnosis not present

## 2017-11-13 DIAGNOSIS — I4891 Unspecified atrial fibrillation: Secondary | ICD-10-CM | POA: Diagnosis not present

## 2017-11-13 DIAGNOSIS — I639 Cerebral infarction, unspecified: Secondary | ICD-10-CM | POA: Diagnosis not present

## 2017-11-17 DIAGNOSIS — I639 Cerebral infarction, unspecified: Secondary | ICD-10-CM | POA: Diagnosis not present

## 2017-11-17 DIAGNOSIS — I4891 Unspecified atrial fibrillation: Secondary | ICD-10-CM | POA: Diagnosis not present

## 2017-11-18 DIAGNOSIS — I639 Cerebral infarction, unspecified: Secondary | ICD-10-CM | POA: Diagnosis not present

## 2017-11-18 DIAGNOSIS — I4891 Unspecified atrial fibrillation: Secondary | ICD-10-CM | POA: Diagnosis not present

## 2017-11-19 ENCOUNTER — Other Ambulatory Visit: Payer: Self-pay | Admitting: Internal Medicine

## 2017-11-19 DIAGNOSIS — I639 Cerebral infarction, unspecified: Secondary | ICD-10-CM | POA: Diagnosis not present

## 2017-11-19 DIAGNOSIS — F01518 Vascular dementia, unspecified severity, with other behavioral disturbance: Secondary | ICD-10-CM

## 2017-11-19 DIAGNOSIS — F0151 Vascular dementia with behavioral disturbance: Secondary | ICD-10-CM

## 2017-11-19 DIAGNOSIS — I4891 Unspecified atrial fibrillation: Secondary | ICD-10-CM | POA: Diagnosis not present

## 2017-11-20 DIAGNOSIS — I4891 Unspecified atrial fibrillation: Secondary | ICD-10-CM | POA: Diagnosis not present

## 2017-11-20 DIAGNOSIS — R2689 Other abnormalities of gait and mobility: Secondary | ICD-10-CM | POA: Diagnosis not present

## 2017-11-20 DIAGNOSIS — I639 Cerebral infarction, unspecified: Secondary | ICD-10-CM | POA: Diagnosis not present

## 2017-11-21 DIAGNOSIS — I4891 Unspecified atrial fibrillation: Secondary | ICD-10-CM | POA: Diagnosis not present

## 2017-11-21 DIAGNOSIS — I639 Cerebral infarction, unspecified: Secondary | ICD-10-CM | POA: Diagnosis not present

## 2017-11-23 DIAGNOSIS — I4891 Unspecified atrial fibrillation: Secondary | ICD-10-CM | POA: Diagnosis not present

## 2017-11-23 DIAGNOSIS — I639 Cerebral infarction, unspecified: Secondary | ICD-10-CM | POA: Diagnosis not present

## 2017-11-24 DIAGNOSIS — I639 Cerebral infarction, unspecified: Secondary | ICD-10-CM | POA: Diagnosis not present

## 2017-11-24 DIAGNOSIS — I4891 Unspecified atrial fibrillation: Secondary | ICD-10-CM | POA: Diagnosis not present

## 2017-11-25 DIAGNOSIS — I4891 Unspecified atrial fibrillation: Secondary | ICD-10-CM | POA: Diagnosis not present

## 2017-11-25 DIAGNOSIS — I639 Cerebral infarction, unspecified: Secondary | ICD-10-CM | POA: Diagnosis not present

## 2017-11-26 ENCOUNTER — Ambulatory Visit (INDEPENDENT_AMBULATORY_CARE_PROVIDER_SITE_OTHER): Payer: Medicare Other | Admitting: Internal Medicine

## 2017-11-26 ENCOUNTER — Encounter: Payer: Self-pay | Admitting: Internal Medicine

## 2017-11-26 VITALS — BP 135/70 | HR 68 | Temp 98.2°F | Wt 134.0 lb

## 2017-11-26 DIAGNOSIS — E538 Deficiency of other specified B group vitamins: Secondary | ICD-10-CM

## 2017-11-26 DIAGNOSIS — H9193 Unspecified hearing loss, bilateral: Secondary | ICD-10-CM | POA: Diagnosis not present

## 2017-11-26 DIAGNOSIS — F01518 Vascular dementia, unspecified severity, with other behavioral disturbance: Secondary | ICD-10-CM

## 2017-11-26 DIAGNOSIS — I10 Essential (primary) hypertension: Secondary | ICD-10-CM

## 2017-11-26 DIAGNOSIS — I480 Paroxysmal atrial fibrillation: Secondary | ICD-10-CM

## 2017-11-26 DIAGNOSIS — F0151 Vascular dementia with behavioral disturbance: Secondary | ICD-10-CM

## 2017-11-26 DIAGNOSIS — I4891 Unspecified atrial fibrillation: Secondary | ICD-10-CM | POA: Diagnosis not present

## 2017-11-26 DIAGNOSIS — I639 Cerebral infarction, unspecified: Secondary | ICD-10-CM | POA: Diagnosis not present

## 2017-11-26 MED ORDER — VITAMIN B-12 100 MCG PO TABS: 100.0000 ug | ORAL_TABLET | Freq: Every day | ORAL | 3 refills | Status: DC

## 2017-11-26 NOTE — Patient Instructions (Signed)
You are looking great please make sure to follow up in 3 months

## 2017-11-26 NOTE — Assessment & Plan Note (Signed)
Husband been denies any issues with her care Still currently on Seroquel-discussed coming off of this  Medication due to black box warning - general we only use these medications for a short time  However husband feels this medication is important for her safety and homecare

## 2017-11-26 NOTE — Progress Notes (Signed)
   [name] Kerrin Mo, MD Phone: [phone]  Reason For Visit: Follow up   # CHRONIC HTN: Current Meds - Not currently on any medications. Diet controlled  Denies CP, dyspnea, HA, edema, dizziness / lightheadedness   # Afib  - Still take Coreg and Elqius for her hx of afib   # Dementia  -Severe dementia. Husband is currently able to take care of patient at home with help.  He gives her Seroquel at night to help her with her sleep.  We have discussed this medication again today as it does have a black box warning.  He believes it is still necessary to for patient to continue taking, otherwise he feels like she is unsafe to herself and others.  #Hearing issues -Husband is concerned about patient's hearing.  States he would like her to be evaluated for hearing aids.  Past Medical History  Reviewed problem list.  Medications- reviewed and updated No additions to family history Social history- patient is a non-smoker  Objective: BP 135/70 (BP Location: Left Arm, Patient Position: Sitting, Cuff Size: Normal)   Pulse 68   Temp 98.2 F (36.8 C) (Oral)   Wt 134 lb (60.8 kg)   SpO2 98%   BMI 20.99 kg/m  Gen: NAD, alert, cooperative with exam Cardio: regular rate and rhythm, S1S2 heard, no murmurs appreciated Pulm: clear to auscultation bilaterally, no wheezes, rhonchi or rales Skin: dry, intact, no rashes or lesions   Assessment/Plan: See problem based a/p  Essential hypertension Well controlled without medications.   Hearing impairment ENT referral would like to be tested and to get hearing aids  Dementia, vascular, mixed, with behavioral disturbance Husband been denies any issues with her care Still currently on Seroquel-discussed coming off of this  Medication due to black box warning - general we only use these medications for a short time  However husband feels this medication is important for her safety and homecare   Campath-induced  atrial fibrillation Continue Eliquis and Coreg

## 2017-11-26 NOTE — Assessment & Plan Note (Signed)
ENT referral would like to be tested and to get hearing aids

## 2017-11-26 NOTE — Assessment & Plan Note (Signed)
Continue Eliquis and Coreg

## 2017-11-26 NOTE — Assessment & Plan Note (Signed)
Well controlled without medications.

## 2017-11-27 DIAGNOSIS — I4891 Unspecified atrial fibrillation: Secondary | ICD-10-CM | POA: Diagnosis not present

## 2017-11-27 DIAGNOSIS — I639 Cerebral infarction, unspecified: Secondary | ICD-10-CM | POA: Diagnosis not present

## 2017-11-28 DIAGNOSIS — I639 Cerebral infarction, unspecified: Secondary | ICD-10-CM | POA: Diagnosis not present

## 2017-11-28 DIAGNOSIS — I4891 Unspecified atrial fibrillation: Secondary | ICD-10-CM | POA: Diagnosis not present

## 2017-11-29 DIAGNOSIS — I639 Cerebral infarction, unspecified: Secondary | ICD-10-CM | POA: Diagnosis not present

## 2017-11-29 DIAGNOSIS — I4891 Unspecified atrial fibrillation: Secondary | ICD-10-CM | POA: Diagnosis not present

## 2017-11-30 DIAGNOSIS — I639 Cerebral infarction, unspecified: Secondary | ICD-10-CM | POA: Diagnosis not present

## 2017-11-30 DIAGNOSIS — I4891 Unspecified atrial fibrillation: Secondary | ICD-10-CM | POA: Diagnosis not present

## 2017-12-01 DIAGNOSIS — I4891 Unspecified atrial fibrillation: Secondary | ICD-10-CM | POA: Diagnosis not present

## 2017-12-01 DIAGNOSIS — I639 Cerebral infarction, unspecified: Secondary | ICD-10-CM | POA: Diagnosis not present

## 2017-12-02 DIAGNOSIS — H9193 Unspecified hearing loss, bilateral: Secondary | ICD-10-CM | POA: Diagnosis not present

## 2017-12-02 DIAGNOSIS — I639 Cerebral infarction, unspecified: Secondary | ICD-10-CM | POA: Diagnosis not present

## 2017-12-02 DIAGNOSIS — I4891 Unspecified atrial fibrillation: Secondary | ICD-10-CM | POA: Diagnosis not present

## 2017-12-03 DIAGNOSIS — I4891 Unspecified atrial fibrillation: Secondary | ICD-10-CM | POA: Diagnosis not present

## 2017-12-03 DIAGNOSIS — I639 Cerebral infarction, unspecified: Secondary | ICD-10-CM | POA: Diagnosis not present

## 2017-12-04 DIAGNOSIS — I639 Cerebral infarction, unspecified: Secondary | ICD-10-CM | POA: Diagnosis not present

## 2017-12-04 DIAGNOSIS — I4891 Unspecified atrial fibrillation: Secondary | ICD-10-CM | POA: Diagnosis not present

## 2017-12-05 DIAGNOSIS — I639 Cerebral infarction, unspecified: Secondary | ICD-10-CM | POA: Diagnosis not present

## 2017-12-05 DIAGNOSIS — I4891 Unspecified atrial fibrillation: Secondary | ICD-10-CM | POA: Diagnosis not present

## 2017-12-06 DIAGNOSIS — I4891 Unspecified atrial fibrillation: Secondary | ICD-10-CM | POA: Diagnosis not present

## 2017-12-06 DIAGNOSIS — I639 Cerebral infarction, unspecified: Secondary | ICD-10-CM | POA: Diagnosis not present

## 2017-12-07 DIAGNOSIS — I4891 Unspecified atrial fibrillation: Secondary | ICD-10-CM | POA: Diagnosis not present

## 2017-12-07 DIAGNOSIS — I639 Cerebral infarction, unspecified: Secondary | ICD-10-CM | POA: Diagnosis not present

## 2017-12-08 DIAGNOSIS — I639 Cerebral infarction, unspecified: Secondary | ICD-10-CM | POA: Diagnosis not present

## 2017-12-08 DIAGNOSIS — I4891 Unspecified atrial fibrillation: Secondary | ICD-10-CM | POA: Diagnosis not present

## 2017-12-09 DIAGNOSIS — I639 Cerebral infarction, unspecified: Secondary | ICD-10-CM | POA: Diagnosis not present

## 2017-12-09 DIAGNOSIS — I4891 Unspecified atrial fibrillation: Secondary | ICD-10-CM | POA: Diagnosis not present

## 2017-12-10 DIAGNOSIS — I639 Cerebral infarction, unspecified: Secondary | ICD-10-CM | POA: Diagnosis not present

## 2017-12-10 DIAGNOSIS — I4891 Unspecified atrial fibrillation: Secondary | ICD-10-CM | POA: Diagnosis not present

## 2017-12-10 DIAGNOSIS — S329XXA Fracture of unspecified parts of lumbosacral spine and pelvis, initial encounter for closed fracture: Secondary | ICD-10-CM | POA: Diagnosis not present

## 2017-12-11 DIAGNOSIS — I4891 Unspecified atrial fibrillation: Secondary | ICD-10-CM | POA: Diagnosis not present

## 2017-12-11 DIAGNOSIS — I639 Cerebral infarction, unspecified: Secondary | ICD-10-CM | POA: Diagnosis not present

## 2017-12-13 DIAGNOSIS — I639 Cerebral infarction, unspecified: Secondary | ICD-10-CM | POA: Diagnosis not present

## 2017-12-13 DIAGNOSIS — I4891 Unspecified atrial fibrillation: Secondary | ICD-10-CM | POA: Diagnosis not present

## 2017-12-14 DIAGNOSIS — I4891 Unspecified atrial fibrillation: Secondary | ICD-10-CM | POA: Diagnosis not present

## 2017-12-14 DIAGNOSIS — I639 Cerebral infarction, unspecified: Secondary | ICD-10-CM | POA: Diagnosis not present

## 2017-12-15 DIAGNOSIS — I639 Cerebral infarction, unspecified: Secondary | ICD-10-CM | POA: Diagnosis not present

## 2017-12-15 DIAGNOSIS — I4891 Unspecified atrial fibrillation: Secondary | ICD-10-CM | POA: Diagnosis not present

## 2017-12-16 DIAGNOSIS — I4891 Unspecified atrial fibrillation: Secondary | ICD-10-CM | POA: Diagnosis not present

## 2017-12-16 DIAGNOSIS — I639 Cerebral infarction, unspecified: Secondary | ICD-10-CM | POA: Diagnosis not present

## 2017-12-17 DIAGNOSIS — I4891 Unspecified atrial fibrillation: Secondary | ICD-10-CM | POA: Diagnosis not present

## 2017-12-17 DIAGNOSIS — I639 Cerebral infarction, unspecified: Secondary | ICD-10-CM | POA: Diagnosis not present

## 2017-12-18 DIAGNOSIS — I639 Cerebral infarction, unspecified: Secondary | ICD-10-CM | POA: Diagnosis not present

## 2017-12-18 DIAGNOSIS — I4891 Unspecified atrial fibrillation: Secondary | ICD-10-CM | POA: Diagnosis not present

## 2017-12-19 DIAGNOSIS — I4891 Unspecified atrial fibrillation: Secondary | ICD-10-CM | POA: Diagnosis not present

## 2017-12-19 DIAGNOSIS — I639 Cerebral infarction, unspecified: Secondary | ICD-10-CM | POA: Diagnosis not present

## 2017-12-20 DIAGNOSIS — I4891 Unspecified atrial fibrillation: Secondary | ICD-10-CM | POA: Diagnosis not present

## 2017-12-20 DIAGNOSIS — I639 Cerebral infarction, unspecified: Secondary | ICD-10-CM | POA: Diagnosis not present

## 2017-12-21 DIAGNOSIS — I639 Cerebral infarction, unspecified: Secondary | ICD-10-CM | POA: Diagnosis not present

## 2017-12-21 DIAGNOSIS — R2689 Other abnormalities of gait and mobility: Secondary | ICD-10-CM | POA: Diagnosis not present

## 2017-12-21 DIAGNOSIS — I4891 Unspecified atrial fibrillation: Secondary | ICD-10-CM | POA: Diagnosis not present

## 2017-12-22 DIAGNOSIS — I639 Cerebral infarction, unspecified: Secondary | ICD-10-CM | POA: Diagnosis not present

## 2017-12-22 DIAGNOSIS — I4891 Unspecified atrial fibrillation: Secondary | ICD-10-CM | POA: Diagnosis not present

## 2017-12-23 DIAGNOSIS — I4891 Unspecified atrial fibrillation: Secondary | ICD-10-CM | POA: Diagnosis not present

## 2017-12-23 DIAGNOSIS — I639 Cerebral infarction, unspecified: Secondary | ICD-10-CM | POA: Diagnosis not present

## 2017-12-25 DIAGNOSIS — I4891 Unspecified atrial fibrillation: Secondary | ICD-10-CM | POA: Diagnosis not present

## 2017-12-25 DIAGNOSIS — I639 Cerebral infarction, unspecified: Secondary | ICD-10-CM | POA: Diagnosis not present

## 2017-12-28 DIAGNOSIS — I639 Cerebral infarction, unspecified: Secondary | ICD-10-CM | POA: Diagnosis not present

## 2017-12-28 DIAGNOSIS — I4891 Unspecified atrial fibrillation: Secondary | ICD-10-CM | POA: Diagnosis not present

## 2017-12-29 DIAGNOSIS — I4891 Unspecified atrial fibrillation: Secondary | ICD-10-CM | POA: Diagnosis not present

## 2017-12-29 DIAGNOSIS — I639 Cerebral infarction, unspecified: Secondary | ICD-10-CM | POA: Diagnosis not present

## 2017-12-29 DIAGNOSIS — H903 Sensorineural hearing loss, bilateral: Secondary | ICD-10-CM | POA: Diagnosis not present

## 2017-12-30 DIAGNOSIS — I639 Cerebral infarction, unspecified: Secondary | ICD-10-CM | POA: Diagnosis not present

## 2017-12-30 DIAGNOSIS — I4891 Unspecified atrial fibrillation: Secondary | ICD-10-CM | POA: Diagnosis not present

## 2017-12-31 DIAGNOSIS — I4891 Unspecified atrial fibrillation: Secondary | ICD-10-CM | POA: Diagnosis not present

## 2017-12-31 DIAGNOSIS — I639 Cerebral infarction, unspecified: Secondary | ICD-10-CM | POA: Diagnosis not present

## 2018-01-01 DIAGNOSIS — I4891 Unspecified atrial fibrillation: Secondary | ICD-10-CM | POA: Diagnosis not present

## 2018-01-01 DIAGNOSIS — I639 Cerebral infarction, unspecified: Secondary | ICD-10-CM | POA: Diagnosis not present

## 2018-01-02 DIAGNOSIS — I639 Cerebral infarction, unspecified: Secondary | ICD-10-CM | POA: Diagnosis not present

## 2018-01-02 DIAGNOSIS — I4891 Unspecified atrial fibrillation: Secondary | ICD-10-CM | POA: Diagnosis not present

## 2018-01-03 DIAGNOSIS — I639 Cerebral infarction, unspecified: Secondary | ICD-10-CM | POA: Diagnosis not present

## 2018-01-03 DIAGNOSIS — I4891 Unspecified atrial fibrillation: Secondary | ICD-10-CM | POA: Diagnosis not present

## 2018-01-04 DIAGNOSIS — I639 Cerebral infarction, unspecified: Secondary | ICD-10-CM | POA: Diagnosis not present

## 2018-01-04 DIAGNOSIS — I4891 Unspecified atrial fibrillation: Secondary | ICD-10-CM | POA: Diagnosis not present

## 2018-01-05 DIAGNOSIS — I4891 Unspecified atrial fibrillation: Secondary | ICD-10-CM | POA: Diagnosis not present

## 2018-01-05 DIAGNOSIS — I639 Cerebral infarction, unspecified: Secondary | ICD-10-CM | POA: Diagnosis not present

## 2018-01-06 DIAGNOSIS — I4891 Unspecified atrial fibrillation: Secondary | ICD-10-CM | POA: Diagnosis not present

## 2018-01-06 DIAGNOSIS — I639 Cerebral infarction, unspecified: Secondary | ICD-10-CM | POA: Diagnosis not present

## 2018-01-07 DIAGNOSIS — I639 Cerebral infarction, unspecified: Secondary | ICD-10-CM | POA: Diagnosis not present

## 2018-01-07 DIAGNOSIS — I4891 Unspecified atrial fibrillation: Secondary | ICD-10-CM | POA: Diagnosis not present

## 2018-01-08 DIAGNOSIS — I639 Cerebral infarction, unspecified: Secondary | ICD-10-CM | POA: Diagnosis not present

## 2018-01-08 DIAGNOSIS — I4891 Unspecified atrial fibrillation: Secondary | ICD-10-CM | POA: Diagnosis not present

## 2018-01-09 DIAGNOSIS — I639 Cerebral infarction, unspecified: Secondary | ICD-10-CM | POA: Diagnosis not present

## 2018-01-09 DIAGNOSIS — I4891 Unspecified atrial fibrillation: Secondary | ICD-10-CM | POA: Diagnosis not present

## 2018-01-11 DIAGNOSIS — I639 Cerebral infarction, unspecified: Secondary | ICD-10-CM | POA: Diagnosis not present

## 2018-01-11 DIAGNOSIS — I4891 Unspecified atrial fibrillation: Secondary | ICD-10-CM | POA: Diagnosis not present

## 2018-01-11 DIAGNOSIS — S329XXA Fracture of unspecified parts of lumbosacral spine and pelvis, initial encounter for closed fracture: Secondary | ICD-10-CM | POA: Diagnosis not present

## 2018-01-12 DIAGNOSIS — I4891 Unspecified atrial fibrillation: Secondary | ICD-10-CM | POA: Diagnosis not present

## 2018-01-12 DIAGNOSIS — I639 Cerebral infarction, unspecified: Secondary | ICD-10-CM | POA: Diagnosis not present

## 2018-01-13 DIAGNOSIS — I4891 Unspecified atrial fibrillation: Secondary | ICD-10-CM | POA: Diagnosis not present

## 2018-01-13 DIAGNOSIS — I639 Cerebral infarction, unspecified: Secondary | ICD-10-CM | POA: Diagnosis not present

## 2018-01-14 DIAGNOSIS — I4891 Unspecified atrial fibrillation: Secondary | ICD-10-CM | POA: Diagnosis not present

## 2018-01-14 DIAGNOSIS — I639 Cerebral infarction, unspecified: Secondary | ICD-10-CM | POA: Diagnosis not present

## 2018-01-15 DIAGNOSIS — I639 Cerebral infarction, unspecified: Secondary | ICD-10-CM | POA: Diagnosis not present

## 2018-01-15 DIAGNOSIS — I4891 Unspecified atrial fibrillation: Secondary | ICD-10-CM | POA: Diagnosis not present

## 2018-01-16 ENCOUNTER — Other Ambulatory Visit: Payer: Self-pay | Admitting: Internal Medicine

## 2018-01-16 DIAGNOSIS — I639 Cerebral infarction, unspecified: Secondary | ICD-10-CM | POA: Diagnosis not present

## 2018-01-16 DIAGNOSIS — I4891 Unspecified atrial fibrillation: Secondary | ICD-10-CM | POA: Diagnosis not present

## 2018-01-16 DIAGNOSIS — I480 Paroxysmal atrial fibrillation: Secondary | ICD-10-CM

## 2018-01-17 DIAGNOSIS — I639 Cerebral infarction, unspecified: Secondary | ICD-10-CM | POA: Diagnosis not present

## 2018-01-17 DIAGNOSIS — I4891 Unspecified atrial fibrillation: Secondary | ICD-10-CM | POA: Diagnosis not present

## 2018-01-18 DIAGNOSIS — I4891 Unspecified atrial fibrillation: Secondary | ICD-10-CM | POA: Diagnosis not present

## 2018-01-18 DIAGNOSIS — I639 Cerebral infarction, unspecified: Secondary | ICD-10-CM | POA: Diagnosis not present

## 2018-01-19 DIAGNOSIS — I639 Cerebral infarction, unspecified: Secondary | ICD-10-CM | POA: Diagnosis not present

## 2018-01-19 DIAGNOSIS — I4891 Unspecified atrial fibrillation: Secondary | ICD-10-CM | POA: Diagnosis not present

## 2018-01-20 DIAGNOSIS — I639 Cerebral infarction, unspecified: Secondary | ICD-10-CM | POA: Diagnosis not present

## 2018-01-20 DIAGNOSIS — R2689 Other abnormalities of gait and mobility: Secondary | ICD-10-CM | POA: Diagnosis not present

## 2018-01-20 DIAGNOSIS — I4891 Unspecified atrial fibrillation: Secondary | ICD-10-CM | POA: Diagnosis not present

## 2018-01-21 DIAGNOSIS — I4891 Unspecified atrial fibrillation: Secondary | ICD-10-CM | POA: Diagnosis not present

## 2018-01-21 DIAGNOSIS — I639 Cerebral infarction, unspecified: Secondary | ICD-10-CM | POA: Diagnosis not present

## 2018-01-22 DIAGNOSIS — I4891 Unspecified atrial fibrillation: Secondary | ICD-10-CM | POA: Diagnosis not present

## 2018-01-22 DIAGNOSIS — I639 Cerebral infarction, unspecified: Secondary | ICD-10-CM | POA: Diagnosis not present

## 2018-01-23 DIAGNOSIS — I4891 Unspecified atrial fibrillation: Secondary | ICD-10-CM | POA: Diagnosis not present

## 2018-01-23 DIAGNOSIS — I639 Cerebral infarction, unspecified: Secondary | ICD-10-CM | POA: Diagnosis not present

## 2018-01-25 DIAGNOSIS — I639 Cerebral infarction, unspecified: Secondary | ICD-10-CM | POA: Diagnosis not present

## 2018-01-25 DIAGNOSIS — I4891 Unspecified atrial fibrillation: Secondary | ICD-10-CM | POA: Diagnosis not present

## 2018-01-26 DIAGNOSIS — I639 Cerebral infarction, unspecified: Secondary | ICD-10-CM | POA: Diagnosis not present

## 2018-01-26 DIAGNOSIS — I4891 Unspecified atrial fibrillation: Secondary | ICD-10-CM | POA: Diagnosis not present

## 2018-01-27 DIAGNOSIS — I4891 Unspecified atrial fibrillation: Secondary | ICD-10-CM | POA: Diagnosis not present

## 2018-01-27 DIAGNOSIS — I639 Cerebral infarction, unspecified: Secondary | ICD-10-CM | POA: Diagnosis not present

## 2018-01-28 DIAGNOSIS — I639 Cerebral infarction, unspecified: Secondary | ICD-10-CM | POA: Diagnosis not present

## 2018-01-28 DIAGNOSIS — I4891 Unspecified atrial fibrillation: Secondary | ICD-10-CM | POA: Diagnosis not present

## 2018-01-29 DIAGNOSIS — I639 Cerebral infarction, unspecified: Secondary | ICD-10-CM | POA: Diagnosis not present

## 2018-01-29 DIAGNOSIS — I4891 Unspecified atrial fibrillation: Secondary | ICD-10-CM | POA: Diagnosis not present

## 2018-01-30 DIAGNOSIS — I639 Cerebral infarction, unspecified: Secondary | ICD-10-CM | POA: Diagnosis not present

## 2018-01-30 DIAGNOSIS — I4891 Unspecified atrial fibrillation: Secondary | ICD-10-CM | POA: Diagnosis not present

## 2018-02-01 DIAGNOSIS — I639 Cerebral infarction, unspecified: Secondary | ICD-10-CM | POA: Diagnosis not present

## 2018-02-01 DIAGNOSIS — I4891 Unspecified atrial fibrillation: Secondary | ICD-10-CM | POA: Diagnosis not present

## 2018-02-02 DIAGNOSIS — I639 Cerebral infarction, unspecified: Secondary | ICD-10-CM | POA: Diagnosis not present

## 2018-02-02 DIAGNOSIS — I4891 Unspecified atrial fibrillation: Secondary | ICD-10-CM | POA: Diagnosis not present

## 2018-02-03 DIAGNOSIS — I639 Cerebral infarction, unspecified: Secondary | ICD-10-CM | POA: Diagnosis not present

## 2018-02-03 DIAGNOSIS — I4891 Unspecified atrial fibrillation: Secondary | ICD-10-CM | POA: Diagnosis not present

## 2018-02-04 DIAGNOSIS — I639 Cerebral infarction, unspecified: Secondary | ICD-10-CM | POA: Diagnosis not present

## 2018-02-04 DIAGNOSIS — I4891 Unspecified atrial fibrillation: Secondary | ICD-10-CM | POA: Diagnosis not present

## 2018-02-05 DIAGNOSIS — I4891 Unspecified atrial fibrillation: Secondary | ICD-10-CM | POA: Diagnosis not present

## 2018-02-05 DIAGNOSIS — I639 Cerebral infarction, unspecified: Secondary | ICD-10-CM | POA: Diagnosis not present

## 2018-02-06 DIAGNOSIS — I639 Cerebral infarction, unspecified: Secondary | ICD-10-CM | POA: Diagnosis not present

## 2018-02-06 DIAGNOSIS — I4891 Unspecified atrial fibrillation: Secondary | ICD-10-CM | POA: Diagnosis not present

## 2018-02-07 DIAGNOSIS — I639 Cerebral infarction, unspecified: Secondary | ICD-10-CM | POA: Diagnosis not present

## 2018-02-07 DIAGNOSIS — I4891 Unspecified atrial fibrillation: Secondary | ICD-10-CM | POA: Diagnosis not present

## 2018-02-08 DIAGNOSIS — I4891 Unspecified atrial fibrillation: Secondary | ICD-10-CM | POA: Diagnosis not present

## 2018-02-08 DIAGNOSIS — I639 Cerebral infarction, unspecified: Secondary | ICD-10-CM | POA: Diagnosis not present

## 2018-02-09 DIAGNOSIS — I639 Cerebral infarction, unspecified: Secondary | ICD-10-CM | POA: Diagnosis not present

## 2018-02-09 DIAGNOSIS — I4891 Unspecified atrial fibrillation: Secondary | ICD-10-CM | POA: Diagnosis not present

## 2018-02-10 DIAGNOSIS — I639 Cerebral infarction, unspecified: Secondary | ICD-10-CM | POA: Diagnosis not present

## 2018-02-10 DIAGNOSIS — S329XXA Fracture of unspecified parts of lumbosacral spine and pelvis, initial encounter for closed fracture: Secondary | ICD-10-CM | POA: Diagnosis not present

## 2018-02-10 DIAGNOSIS — I4891 Unspecified atrial fibrillation: Secondary | ICD-10-CM | POA: Diagnosis not present

## 2018-02-11 ENCOUNTER — Other Ambulatory Visit: Payer: Self-pay | Admitting: Internal Medicine

## 2018-02-11 DIAGNOSIS — I639 Cerebral infarction, unspecified: Secondary | ICD-10-CM | POA: Diagnosis not present

## 2018-02-11 DIAGNOSIS — I4891 Unspecified atrial fibrillation: Secondary | ICD-10-CM | POA: Diagnosis not present

## 2018-02-12 DIAGNOSIS — I4891 Unspecified atrial fibrillation: Secondary | ICD-10-CM | POA: Diagnosis not present

## 2018-02-12 DIAGNOSIS — I639 Cerebral infarction, unspecified: Secondary | ICD-10-CM | POA: Diagnosis not present

## 2018-02-14 DIAGNOSIS — I639 Cerebral infarction, unspecified: Secondary | ICD-10-CM | POA: Diagnosis not present

## 2018-02-14 DIAGNOSIS — I4891 Unspecified atrial fibrillation: Secondary | ICD-10-CM | POA: Diagnosis not present

## 2018-02-15 DIAGNOSIS — I4891 Unspecified atrial fibrillation: Secondary | ICD-10-CM | POA: Diagnosis not present

## 2018-02-15 DIAGNOSIS — I639 Cerebral infarction, unspecified: Secondary | ICD-10-CM | POA: Diagnosis not present

## 2018-02-16 DIAGNOSIS — I639 Cerebral infarction, unspecified: Secondary | ICD-10-CM | POA: Diagnosis not present

## 2018-02-16 DIAGNOSIS — I4891 Unspecified atrial fibrillation: Secondary | ICD-10-CM | POA: Diagnosis not present

## 2018-02-17 DIAGNOSIS — I639 Cerebral infarction, unspecified: Secondary | ICD-10-CM | POA: Diagnosis not present

## 2018-02-17 DIAGNOSIS — I4891 Unspecified atrial fibrillation: Secondary | ICD-10-CM | POA: Diagnosis not present

## 2018-02-18 DIAGNOSIS — I4891 Unspecified atrial fibrillation: Secondary | ICD-10-CM | POA: Diagnosis not present

## 2018-02-18 DIAGNOSIS — I639 Cerebral infarction, unspecified: Secondary | ICD-10-CM | POA: Diagnosis not present

## 2018-02-19 DIAGNOSIS — I4891 Unspecified atrial fibrillation: Secondary | ICD-10-CM | POA: Diagnosis not present

## 2018-02-19 DIAGNOSIS — I639 Cerebral infarction, unspecified: Secondary | ICD-10-CM | POA: Diagnosis not present

## 2018-02-20 DIAGNOSIS — I639 Cerebral infarction, unspecified: Secondary | ICD-10-CM | POA: Diagnosis not present

## 2018-02-20 DIAGNOSIS — I4891 Unspecified atrial fibrillation: Secondary | ICD-10-CM | POA: Diagnosis not present

## 2018-02-20 DIAGNOSIS — R2689 Other abnormalities of gait and mobility: Secondary | ICD-10-CM | POA: Diagnosis not present

## 2018-02-22 DIAGNOSIS — I639 Cerebral infarction, unspecified: Secondary | ICD-10-CM | POA: Diagnosis not present

## 2018-02-22 DIAGNOSIS — I4891 Unspecified atrial fibrillation: Secondary | ICD-10-CM | POA: Diagnosis not present

## 2018-02-23 DIAGNOSIS — I639 Cerebral infarction, unspecified: Secondary | ICD-10-CM | POA: Diagnosis not present

## 2018-02-23 DIAGNOSIS — I4891 Unspecified atrial fibrillation: Secondary | ICD-10-CM | POA: Diagnosis not present

## 2018-02-24 DIAGNOSIS — I4891 Unspecified atrial fibrillation: Secondary | ICD-10-CM | POA: Diagnosis not present

## 2018-02-24 DIAGNOSIS — I639 Cerebral infarction, unspecified: Secondary | ICD-10-CM | POA: Diagnosis not present

## 2018-02-25 DIAGNOSIS — I639 Cerebral infarction, unspecified: Secondary | ICD-10-CM | POA: Diagnosis not present

## 2018-02-25 DIAGNOSIS — I4891 Unspecified atrial fibrillation: Secondary | ICD-10-CM | POA: Diagnosis not present

## 2018-02-26 ENCOUNTER — Encounter: Payer: Self-pay | Admitting: Student in an Organized Health Care Education/Training Program

## 2018-02-26 ENCOUNTER — Other Ambulatory Visit: Payer: Self-pay

## 2018-02-26 ENCOUNTER — Ambulatory Visit (INDEPENDENT_AMBULATORY_CARE_PROVIDER_SITE_OTHER): Payer: Medicare Other | Admitting: Student in an Organized Health Care Education/Training Program

## 2018-02-26 VITALS — BP 160/82 | HR 78 | Temp 98.6°F | Ht 67.0 in | Wt 135.0 lb

## 2018-02-26 DIAGNOSIS — H9193 Unspecified hearing loss, bilateral: Secondary | ICD-10-CM | POA: Diagnosis not present

## 2018-02-26 DIAGNOSIS — E1142 Type 2 diabetes mellitus with diabetic polyneuropathy: Secondary | ICD-10-CM | POA: Diagnosis not present

## 2018-02-26 DIAGNOSIS — F0151 Vascular dementia with behavioral disturbance: Secondary | ICD-10-CM | POA: Diagnosis not present

## 2018-02-26 DIAGNOSIS — I10 Essential (primary) hypertension: Secondary | ICD-10-CM | POA: Diagnosis not present

## 2018-02-26 DIAGNOSIS — I4891 Unspecified atrial fibrillation: Secondary | ICD-10-CM

## 2018-02-26 DIAGNOSIS — I480 Paroxysmal atrial fibrillation: Secondary | ICD-10-CM

## 2018-02-26 DIAGNOSIS — F01518 Vascular dementia, unspecified severity, with other behavioral disturbance: Secondary | ICD-10-CM

## 2018-02-26 DIAGNOSIS — I639 Cerebral infarction, unspecified: Secondary | ICD-10-CM | POA: Diagnosis not present

## 2018-02-26 LAB — POCT GLYCOSYLATED HEMOGLOBIN (HGB A1C): HbA1c, POC (controlled diabetic range): 5.2 % (ref 0.0–7.0)

## 2018-02-26 MED ORDER — APIXABAN 2.5 MG PO TABS: 2.5000 mg | ORAL_TABLET | Freq: Two times a day (BID) | ORAL | 0 refills | Status: DC

## 2018-02-26 MED ORDER — CARVEDILOL 6.25 MG PO TABS: 6.2500 mg | ORAL_TABLET | Freq: Two times a day (BID) | ORAL | 1 refills | Status: DC

## 2018-02-26 MED ORDER — QUETIAPINE FUMARATE 50 MG PO TABS: 100.0000 mg | ORAL_TABLET | Freq: Every day | ORAL | 0 refills | Status: DC

## 2018-02-26 NOTE — Assessment & Plan Note (Signed)
Patient is rate controlled. Regular rhythm on auscultation Seen by [facility] cardiology Dr. Glena [place] in past Continue Coreg Continue eliquis

## 2018-02-26 NOTE — Assessment & Plan Note (Signed)
Patient had difficulty in conversation on exam today-mixed picture due to hearing impairment and dementia. Patient's husband is attempting to obtain hearing aids for patient

## 2018-02-26 NOTE — Assessment & Plan Note (Signed)
BP abnormally elevated in office today at 160/82 Patient is asymptomatic. Her husband states she is adherent to her home medications and did take them this morning Continue home medication of carvedilol. Recheck blood pressure in 1 month

## 2018-02-26 NOTE — Assessment & Plan Note (Signed)
A1c today is 5.2 Patient is not on any home medications No further testing or treatment at this time Recheck in 1 year

## 2018-02-26 NOTE — Progress Notes (Signed)
Subjective:    Patient ID: [name], female    DOB: 05/18/1928, 84 y.o.   MRN: [id number]   CC: diabetes check  HPI: patient states that she is "cured" from diabetes. She is not currently on home medication management. Her A1c in office today is 5.2. She has no complaints today. Her husband is her caregiver and he has no concerns for her at this time.  Patient does not smoke.  Smoking status reviewed   ROS: pertinent noted in the HPI   Past Medical History:  Diagnosis Date  . A-fib ([place])   . Anemia    anemia of renal disease  . Arthritis    mutiple joints  . Cancer [facility])    Renal neoplasm- survellience via Urology  . CHF (congestive heart failure) ([place])    Diastolic dys-- EF 78% july 2011  . Chronic kidney disease    Stage III- followed by [facility]  . COPD (chronic obstructive pulmonary disease) ([place])   . Dementia, vascular, mixed, with behavioral disturbance 01/31/2016  . Diabetes mellitus   . DIASTOLIC DYSFUNCTION 4/69/6295   Qualifier: Diagnosis of  By: Buelah Manis MD, Lonell Grandchild    . GERD (gastroesophageal reflux disease)   . H/O hiatal hernia   . Hyperlipidemia   . Hypertension   . Left bundle branch block   . NEOPLASM, KIDNEY 03/04/2007   complex right renal cyst; Per patient- Urologist no longer following- no changes.      . Nonischemic cardiomyopathy ([place])   . Peripheral neuropathy   . Pneumonia   . Shortness of breath    Hx: of with exertion  . Venous insufficiency 02/24/2011    Past Surgical History:  Procedure Laterality Date  . ABDOMINAL HYSTERECTOMY    . CARDIAC CATHETERIZATION  08/12/2011   heavily calcified L main; LAD with no stenosis; 1st diagonal free of stenosis; L Cfx with no stenosis; OM1/2 without stenosis; PDA without stenosis; RCA without significant stenosis; non-ischemic cardiomyopathy (Dr. Kathy Breach)  . CATARACT EXTRACTION W/ INTRAOCULAR LENS  IMPLANT, BILATERAL    . CHOLECYSTECTOMY  1987  . DILATION AND CURETTAGE OF UTERUS    .  HIP ARTHROPLASTY Right 03/08/2017   Procedure: ARTHROPLASTY BIPOLAR HIP (HEMIARTHROPLASTY);  Surgeon: Paralee Cancel, MD;  [facility]: [facility];  Service: Orthopedics;  Laterality: Right;  . LEFT HEART CATHETERIZATION WITH CORONARY ANGIOGRAM N/A 08/12/2011   Procedure: LEFT HEART CATHETERIZATION WITH CORONARY ANGIOGRAM;  Surgeon: Pixie Casino, MD;  Location: [facility] CATH LAB;  Service: Cardiovascular;  Laterality: N/A;  . LUMBAR LAMINECTOMY/DECOMPRESSION MICRODISCECTOMY Left 05/12/2013   Procedure: Left Lumbar four-five laminotomy and microdiskectomy;  Surgeon: Hosie Spangle, MD;  Location: [place] NEURO ORS;  Service: Neurosurgery;  Laterality: Left;  Left Lumbar four-five laminotomy and microdiskectomy  . NM MYOCAR PERF WALL MOTION  05/2011   lexiscan myoview - fixed septal defect r/t LBBB; normal pattern of perfusion in all regions; low risk scan  . TONSILLECTOMY    . TRANSTHORACIC ECHOCARDIOGRAM  09/2011   EF 60-65%; MV mod-severely calcified, mildly thickened MV annulus, mild MR; LA mod dilated; RA mildly dilated    Past medical history, surgical, family, and social history reviewed and updated in the EMR as appropriate.  Objective:  BP (!) 160/82   Pulse 78   Temp 98.6 F (37 C) (Oral)   Ht 5\' 7"  (1.702 m)   Wt 135 lb (61.2 kg)   SpO2 98%   BMI 21.14 kg/m   Vitals and nursing note reviewed  General: NAD, pleasant, able to participate in exam, patient in wheelchair Cardiac: RRR, S1 S2 present. normal heart sounds, no murmurs. Respiratory: CTAB, normal effort, No wheezes, rales or rhonchi Extremities: trace edema in bilateral extremities-left worse than right. No cyanosis, well perfused Skin: warm and dry, no rashes noted Neuro: alert, no obvious focal deficits Psych: Normal affect and mood. Patient is pleasant. She is alert and oriented. Appropriate conversation at most times but will sometimes make comments that are not relative to conversation. Unclear whether this is due to dementia or  hearing impairment.    Assessment & Plan:    Essential hypertension BP abnormally elevated in office today at 160/82 Patient is asymptomatic. Her husband states she is adherent to her home medications and did take them this morning Continue home medication of carvedilol. Recheck blood pressure in 1 month   Atrial fibrillation [facility]) Patient is rate controlled. Regular rhythm on auscultation Seen by [facility] cardiology Dr. Glena Norfolk in past Continue Coreg Continue eliquis   Type 2 diabetes mellitus ([place]) A1c today is 5.2 Patient is not on any home medications No further testing or treatment at this time Recheck in 1 year  Dementia, vascular, mixed, with behavioral disturbance Patient is on seroquel Husband is caretaker and states she is doing well on current medication Continue to monitor  Hearing impairment Patient had difficulty in conversation on exam today-mixed picture due to hearing impairment and dementia. Patient's husband is attempting to obtain hearing aids for patient  Doristine Mango, [place] [facility] PGY-1

## 2018-02-26 NOTE — Assessment & Plan Note (Signed)
Patient is on seroquel Husband is caretaker and states she is doing well on current medication Continue to monitor

## 2018-02-27 ENCOUNTER — Other Ambulatory Visit: Payer: Self-pay | Admitting: Internal Medicine

## 2018-02-27 DIAGNOSIS — F0151 Vascular dementia with behavioral disturbance: Secondary | ICD-10-CM

## 2018-02-27 DIAGNOSIS — I639 Cerebral infarction, unspecified: Secondary | ICD-10-CM | POA: Diagnosis not present

## 2018-02-27 DIAGNOSIS — I4891 Unspecified atrial fibrillation: Secondary | ICD-10-CM | POA: Diagnosis not present

## 2018-02-27 DIAGNOSIS — F01518 Vascular dementia, unspecified severity, with other behavioral disturbance: Secondary | ICD-10-CM

## 2018-02-28 DIAGNOSIS — I639 Cerebral infarction, unspecified: Secondary | ICD-10-CM | POA: Diagnosis not present

## 2018-02-28 DIAGNOSIS — I4891 Unspecified atrial fibrillation: Secondary | ICD-10-CM | POA: Diagnosis not present

## 2018-03-15 DIAGNOSIS — I639 Cerebral infarction, unspecified: Secondary | ICD-10-CM | POA: Diagnosis not present

## 2018-03-15 DIAGNOSIS — I4891 Unspecified atrial fibrillation: Secondary | ICD-10-CM | POA: Diagnosis not present

## 2018-03-16 DIAGNOSIS — I4891 Unspecified atrial fibrillation: Secondary | ICD-10-CM | POA: Diagnosis not present

## 2018-03-16 DIAGNOSIS — I639 Cerebral infarction, unspecified: Secondary | ICD-10-CM | POA: Diagnosis not present

## 2018-03-17 DIAGNOSIS — I4891 Unspecified atrial fibrillation: Secondary | ICD-10-CM | POA: Diagnosis not present

## 2018-03-17 DIAGNOSIS — I639 Cerebral infarction, unspecified: Secondary | ICD-10-CM | POA: Diagnosis not present

## 2018-03-18 DIAGNOSIS — I4891 Unspecified atrial fibrillation: Secondary | ICD-10-CM | POA: Diagnosis not present

## 2018-03-18 DIAGNOSIS — I639 Cerebral infarction, unspecified: Secondary | ICD-10-CM | POA: Diagnosis not present

## 2018-03-18 DIAGNOSIS — S329XXA Fracture of unspecified parts of lumbosacral spine and pelvis, initial encounter for closed fracture: Secondary | ICD-10-CM | POA: Diagnosis not present

## 2018-03-19 DIAGNOSIS — I4891 Unspecified atrial fibrillation: Secondary | ICD-10-CM | POA: Diagnosis not present

## 2018-03-19 DIAGNOSIS — I639 Cerebral infarction, unspecified: Secondary | ICD-10-CM | POA: Diagnosis not present

## 2018-03-20 DIAGNOSIS — I4891 Unspecified atrial fibrillation: Secondary | ICD-10-CM | POA: Diagnosis not present

## 2018-03-20 DIAGNOSIS — I639 Cerebral infarction, unspecified: Secondary | ICD-10-CM | POA: Diagnosis not present

## 2018-03-22 DIAGNOSIS — I4891 Unspecified atrial fibrillation: Secondary | ICD-10-CM | POA: Diagnosis not present

## 2018-03-22 DIAGNOSIS — I639 Cerebral infarction, unspecified: Secondary | ICD-10-CM | POA: Diagnosis not present

## 2018-03-23 DIAGNOSIS — R2689 Other abnormalities of gait and mobility: Secondary | ICD-10-CM | POA: Diagnosis not present

## 2018-03-25 DIAGNOSIS — I4891 Unspecified atrial fibrillation: Secondary | ICD-10-CM | POA: Diagnosis not present

## 2018-03-25 DIAGNOSIS — I639 Cerebral infarction, unspecified: Secondary | ICD-10-CM | POA: Diagnosis not present

## 2018-03-26 DIAGNOSIS — I4891 Unspecified atrial fibrillation: Secondary | ICD-10-CM | POA: Diagnosis not present

## 2018-03-26 DIAGNOSIS — I639 Cerebral infarction, unspecified: Secondary | ICD-10-CM | POA: Diagnosis not present

## 2018-03-27 DIAGNOSIS — I4891 Unspecified atrial fibrillation: Secondary | ICD-10-CM | POA: Diagnosis not present

## 2018-03-27 DIAGNOSIS — I639 Cerebral infarction, unspecified: Secondary | ICD-10-CM | POA: Diagnosis not present

## 2018-03-28 DIAGNOSIS — I639 Cerebral infarction, unspecified: Secondary | ICD-10-CM | POA: Diagnosis not present

## 2018-03-28 DIAGNOSIS — I4891 Unspecified atrial fibrillation: Secondary | ICD-10-CM | POA: Diagnosis not present

## 2018-03-29 DIAGNOSIS — I4891 Unspecified atrial fibrillation: Secondary | ICD-10-CM | POA: Diagnosis not present

## 2018-03-29 DIAGNOSIS — I639 Cerebral infarction, unspecified: Secondary | ICD-10-CM | POA: Diagnosis not present

## 2018-03-30 DIAGNOSIS — I4891 Unspecified atrial fibrillation: Secondary | ICD-10-CM | POA: Diagnosis not present

## 2018-03-30 DIAGNOSIS — I639 Cerebral infarction, unspecified: Secondary | ICD-10-CM | POA: Diagnosis not present

## 2018-03-31 DIAGNOSIS — I4891 Unspecified atrial fibrillation: Secondary | ICD-10-CM | POA: Diagnosis not present

## 2018-03-31 DIAGNOSIS — I639 Cerebral infarction, unspecified: Secondary | ICD-10-CM | POA: Diagnosis not present

## 2018-04-01 DIAGNOSIS — I4891 Unspecified atrial fibrillation: Secondary | ICD-10-CM | POA: Diagnosis not present

## 2018-04-01 DIAGNOSIS — I639 Cerebral infarction, unspecified: Secondary | ICD-10-CM | POA: Diagnosis not present

## 2018-04-02 DIAGNOSIS — I639 Cerebral infarction, unspecified: Secondary | ICD-10-CM | POA: Diagnosis not present

## 2018-04-02 DIAGNOSIS — I4891 Unspecified atrial fibrillation: Secondary | ICD-10-CM | POA: Diagnosis not present

## 2018-04-03 DIAGNOSIS — I639 Cerebral infarction, unspecified: Secondary | ICD-10-CM | POA: Diagnosis not present

## 2018-04-03 DIAGNOSIS — I4891 Unspecified atrial fibrillation: Secondary | ICD-10-CM | POA: Diagnosis not present

## 2018-04-04 DIAGNOSIS — I639 Cerebral infarction, unspecified: Secondary | ICD-10-CM | POA: Diagnosis not present

## 2018-04-04 DIAGNOSIS — I4891 Unspecified atrial fibrillation: Secondary | ICD-10-CM | POA: Diagnosis not present

## 2018-04-05 DIAGNOSIS — I639 Cerebral infarction, unspecified: Secondary | ICD-10-CM | POA: Diagnosis not present

## 2018-04-05 DIAGNOSIS — I4891 Unspecified atrial fibrillation: Secondary | ICD-10-CM | POA: Diagnosis not present

## 2018-04-06 DIAGNOSIS — I639 Cerebral infarction, unspecified: Secondary | ICD-10-CM | POA: Diagnosis not present

## 2018-04-06 DIAGNOSIS — I4891 Unspecified atrial fibrillation: Secondary | ICD-10-CM | POA: Diagnosis not present

## 2018-04-07 DIAGNOSIS — I4891 Unspecified atrial fibrillation: Secondary | ICD-10-CM | POA: Diagnosis not present

## 2018-04-07 DIAGNOSIS — I639 Cerebral infarction, unspecified: Secondary | ICD-10-CM | POA: Diagnosis not present

## 2018-04-08 DIAGNOSIS — I639 Cerebral infarction, unspecified: Secondary | ICD-10-CM | POA: Diagnosis not present

## 2018-04-08 DIAGNOSIS — I4891 Unspecified atrial fibrillation: Secondary | ICD-10-CM | POA: Diagnosis not present

## 2018-04-09 DIAGNOSIS — I639 Cerebral infarction, unspecified: Secondary | ICD-10-CM | POA: Diagnosis not present

## 2018-04-09 DIAGNOSIS — I4891 Unspecified atrial fibrillation: Secondary | ICD-10-CM | POA: Diagnosis not present

## 2018-04-10 DIAGNOSIS — I639 Cerebral infarction, unspecified: Secondary | ICD-10-CM | POA: Diagnosis not present

## 2018-04-10 DIAGNOSIS — I4891 Unspecified atrial fibrillation: Secondary | ICD-10-CM | POA: Diagnosis not present

## 2018-04-11 DIAGNOSIS — I4891 Unspecified atrial fibrillation: Secondary | ICD-10-CM | POA: Diagnosis not present

## 2018-04-11 DIAGNOSIS — I639 Cerebral infarction, unspecified: Secondary | ICD-10-CM | POA: Diagnosis not present

## 2018-04-12 DIAGNOSIS — I639 Cerebral infarction, unspecified: Secondary | ICD-10-CM | POA: Diagnosis not present

## 2018-04-12 DIAGNOSIS — I4891 Unspecified atrial fibrillation: Secondary | ICD-10-CM | POA: Diagnosis not present

## 2018-04-13 DIAGNOSIS — I639 Cerebral infarction, unspecified: Secondary | ICD-10-CM | POA: Diagnosis not present

## 2018-04-13 DIAGNOSIS — I4891 Unspecified atrial fibrillation: Secondary | ICD-10-CM | POA: Diagnosis not present

## 2018-04-14 DIAGNOSIS — I639 Cerebral infarction, unspecified: Secondary | ICD-10-CM | POA: Diagnosis not present

## 2018-04-14 DIAGNOSIS — I4891 Unspecified atrial fibrillation: Secondary | ICD-10-CM | POA: Diagnosis not present

## 2018-04-15 ENCOUNTER — Other Ambulatory Visit: Payer: Self-pay

## 2018-04-15 ENCOUNTER — Ambulatory Visit (INDEPENDENT_AMBULATORY_CARE_PROVIDER_SITE_OTHER): Payer: Medicare Other | Admitting: Student in an Organized Health Care Education/Training Program

## 2018-04-15 ENCOUNTER — Encounter: Payer: Self-pay | Admitting: Student in an Organized Health Care Education/Training Program

## 2018-04-15 DIAGNOSIS — I10 Essential (primary) hypertension: Secondary | ICD-10-CM | POA: Diagnosis not present

## 2018-04-15 DIAGNOSIS — I4891 Unspecified atrial fibrillation: Secondary | ICD-10-CM | POA: Diagnosis not present

## 2018-04-15 DIAGNOSIS — F0151 Vascular dementia with behavioral disturbance: Secondary | ICD-10-CM

## 2018-04-15 DIAGNOSIS — I639 Cerebral infarction, unspecified: Secondary | ICD-10-CM | POA: Diagnosis not present

## 2018-04-15 DIAGNOSIS — F01518 Vascular dementia, unspecified severity, with other behavioral disturbance: Secondary | ICD-10-CM

## 2018-04-15 NOTE — Progress Notes (Signed)
Subjective:    Patient ID: [name], female    DOB: August 15, 1927, 84 y.o.   MRN: [id number]   CC: blood pressure check  HPI: Patient states that she feels well today. She has no complaints or concerns today. Her blood pressure is elevated today at 150/78 and was elevated at last appointment. She is adherent to her carvedilol given to her by husband. She normally has increased HR too and a history of Afib so could increase dose. Patient has difficult hearing me speak and her husband has been working on getting her hearing aids and will be able to get them tomorrow.  She got her flu shot at the pharmacy 3 weeks ago.  Smoking status reviewed   ROS: pertinent noted in the HPI   Past Medical History:  Diagnosis Date  . A-fib ([place])   . Anemia    anemia of renal disease  . Arthritis    mutiple joints  . Cancer [facility])    Renal neoplasm- survellience via Urology  . CHF (congestive heart failure) ([place])    Diastolic dys-- EF 86% july 2011  . Chronic kidney disease    Stage III- followed by [facility]  . COPD (chronic obstructive pulmonary disease) ([place])   . Dementia, vascular, mixed, with behavioral disturbance 01/31/2016  . Diabetes mellitus   . DIASTOLIC DYSFUNCTION 5/78/4696   Qualifier: Diagnosis of  By: Buelah Manis MD, Lonell Grandchild    . GERD (gastroesophageal reflux disease)   . H/O hiatal hernia   . Hyperlipidemia   . Hypertension   . Left bundle branch block   . NEOPLASM, KIDNEY 03/04/2007   complex right renal cyst; Per patient- Urologist no longer following- no changes.      . Nonischemic cardiomyopathy ([place])   . Peripheral neuropathy   . Pneumonia   . Shortness of breath    Hx: of with exertion  . Venous insufficiency 02/24/2011    Past Surgical History:  Procedure Laterality Date  . ABDOMINAL HYSTERECTOMY    . CARDIAC CATHETERIZATION  08/12/2011   heavily calcified L main; LAD with no stenosis; 1st diagonal free of stenosis; L Cfx with no stenosis; OM1/2 without  stenosis; PDA without stenosis; RCA without significant stenosis; non-ischemic cardiomyopathy (Dr. Kathy Breach)  . CATARACT EXTRACTION W/ INTRAOCULAR LENS  IMPLANT, BILATERAL    . CHOLECYSTECTOMY  1987  . DILATION AND CURETTAGE OF UTERUS    . HIP ARTHROPLASTY Right 03/08/2017   Procedure: ARTHROPLASTY BIPOLAR HIP (HEMIARTHROPLASTY);  Surgeon: Paralee Cancel, MD;  [facility];  Service: Orthopedics;  Laterality: Right;  . LEFT HEART CATHETERIZATION WITH CORONARY ANGIOGRAM N/A 08/12/2011   Procedure: LEFT HEART CATHETERIZATION WITH CORONARY ANGIOGRAM;  Surgeon: Pixie Casino, MD;  Location: [facility] CATH LAB;  Service: Cardiovascular;  Laterality: N/A;  . LUMBAR LAMINECTOMY/DECOMPRESSION MICRODISCECTOMY Left 05/12/2013   Procedure: Left Lumbar four-five laminotomy and microdiskectomy;  Surgeon: Hosie Spangle, MD;  Location: [place] NEURO ORS;  Service: Neurosurgery;  Laterality: Left;  Left Lumbar four-five laminotomy and microdiskectomy  . NM MYOCAR PERF WALL MOTION  05/2011   lexiscan myoview - fixed septal defect r/t LBBB; normal pattern of perfusion in all regions; low risk scan  . TONSILLECTOMY    . TRANSTHORACIC ECHOCARDIOGRAM  09/2011   EF 60-65%; MV mod-severely calcified, mildly thickened MV annulus, mild MR; LA mod dilated; RA mildly dilated    Past medical history, surgical, family, and social history reviewed and updated in the EMR as appropriate.  Objective:  BP Marland Kitchen)  150/78   Pulse (!) 49   Temp 98 F (36.7 C) (Oral)   Wt 138 lb 12.8 oz (63 kg)   SpO2 99%   BMI 21.74 kg/m   Vitals and nursing note reviewed  General: NAD, pleasant, able to participate in exam Cardiac: RRR, S1 S2 present. Very faint heart sounds, no murmurs appreciated. Respiratory: CTAB, exaggerated effort of breaths, No wheezes, rales or rhonchi Extremities: no edema or cyanosis. Lower extremities well perfused. Left hand has approximately 27mm diameter flesh colored lesion on it. Possible seborrhoic keratosis   Skin: warm and dry, no rashes noted. Dandruff from head.  Neuro: alert, no obvious focal deficits Psych: Normal affect and mood. She will occasionally start a sentence and forget what she's talking about so start laughing and say "never mind". This is her baseline from last visit with her.   Assessment & Plan:    Essential hypertension Poorly controlled. Elevated BP 150/78 in office for second time today. Rechecked manual and HR in office. HR was in high 60s in office. Normally above 70 and is rate controlled for Afib. - increase carvedilol dose to double - recheck blood pressure in office in about 3 months  Atrial fibrillation ([place]) Patient is rate controlled on carvedilol and taking xarelto. Heart rhythm almost normal on exam -continue xarelto - increased carvedilol in setting of HTN - recheck in about 3 months  Dementia, vascular, mixed, with behavioral disturbance Well controlled on quetiapine. Patient is pleasant. She appears to be at baseline from last visit. Husband is main caregiver. -will continue to monitor.  Hearing impairment - patient is receiving new hearing aids tomorrow  Eye exam is up to date. Flu shot received at pharmacy last month.  Doristine Mango, [place] [facility] PGY-1

## 2018-04-15 NOTE — Assessment & Plan Note (Signed)
Patient is rate controlled on carvedilol and taking xarelto. Heart rhythm almost normal on exam -continue xarelto - increased carvedilol in setting of HTN - recheck in about 3 months

## 2018-04-15 NOTE — Assessment & Plan Note (Signed)
Poorly controlled. Elevated BP 150/78 in office for second time today. Rechecked manual and HR in office. HR was in high 60s in office. Normally above 70 and is rate controlled for Afib. - increase carvedilol dose to double - recheck blood pressure in office in about 3 months

## 2018-04-15 NOTE — Patient Instructions (Addendum)
It was a pleasure to see you today!  To summarize our discussion for this visit:  HTN- doubling the carvedilol  Afib- rate controlled well  Hearing aids- getting tomorrow  Flu shot???  Some additional health maintenance measures we should update are: Marland Kitchen Eye exam   Please return to our clinic to see me in 3 months for recheck.  Call the clinic at [phone] if your symptoms worsen or you have any concerns.  Thank you for allowing me to take part in your care,  Dr. Doristine Mango   Thanks for choosing [facility] for your primary care.

## 2018-04-15 NOTE — Assessment & Plan Note (Signed)
Well controlled on quetiapine. Patient is pleasant. She appears to be at baseline from last visit. Husband is main caregiver. -will continue to monitor.

## 2018-04-16 DIAGNOSIS — I4891 Unspecified atrial fibrillation: Secondary | ICD-10-CM | POA: Diagnosis not present

## 2018-04-16 DIAGNOSIS — I639 Cerebral infarction, unspecified: Secondary | ICD-10-CM | POA: Diagnosis not present

## 2018-04-17 DIAGNOSIS — M353 Polymyalgia rheumatica: Secondary | ICD-10-CM | POA: Diagnosis not present

## 2018-04-17 DIAGNOSIS — S329XXA Fracture of unspecified parts of lumbosacral spine and pelvis, initial encounter for closed fracture: Secondary | ICD-10-CM | POA: Diagnosis not present

## 2018-04-17 DIAGNOSIS — I4891 Unspecified atrial fibrillation: Secondary | ICD-10-CM | POA: Diagnosis not present

## 2018-04-17 DIAGNOSIS — I639 Cerebral infarction, unspecified: Secondary | ICD-10-CM | POA: Diagnosis not present

## 2018-04-18 DIAGNOSIS — I639 Cerebral infarction, unspecified: Secondary | ICD-10-CM | POA: Diagnosis not present

## 2018-04-18 DIAGNOSIS — I4891 Unspecified atrial fibrillation: Secondary | ICD-10-CM | POA: Diagnosis not present

## 2018-04-19 DIAGNOSIS — I4891 Unspecified atrial fibrillation: Secondary | ICD-10-CM | POA: Diagnosis not present

## 2018-04-19 DIAGNOSIS — I639 Cerebral infarction, unspecified: Secondary | ICD-10-CM | POA: Diagnosis not present

## 2018-04-21 DIAGNOSIS — I4891 Unspecified atrial fibrillation: Secondary | ICD-10-CM | POA: Diagnosis not present

## 2018-04-21 DIAGNOSIS — I639 Cerebral infarction, unspecified: Secondary | ICD-10-CM | POA: Diagnosis not present

## 2018-04-22 DIAGNOSIS — R2689 Other abnormalities of gait and mobility: Secondary | ICD-10-CM | POA: Diagnosis not present

## 2018-04-22 DIAGNOSIS — I4891 Unspecified atrial fibrillation: Secondary | ICD-10-CM | POA: Diagnosis not present

## 2018-04-22 DIAGNOSIS — I639 Cerebral infarction, unspecified: Secondary | ICD-10-CM | POA: Diagnosis not present

## 2018-04-23 DIAGNOSIS — I639 Cerebral infarction, unspecified: Secondary | ICD-10-CM | POA: Diagnosis not present

## 2018-04-23 DIAGNOSIS — I4891 Unspecified atrial fibrillation: Secondary | ICD-10-CM | POA: Diagnosis not present

## 2018-04-24 DIAGNOSIS — I639 Cerebral infarction, unspecified: Secondary | ICD-10-CM | POA: Diagnosis not present

## 2018-04-24 DIAGNOSIS — I4891 Unspecified atrial fibrillation: Secondary | ICD-10-CM | POA: Diagnosis not present

## 2018-04-26 DIAGNOSIS — I639 Cerebral infarction, unspecified: Secondary | ICD-10-CM | POA: Diagnosis not present

## 2018-04-26 DIAGNOSIS — I4891 Unspecified atrial fibrillation: Secondary | ICD-10-CM | POA: Diagnosis not present

## 2018-04-27 DIAGNOSIS — I4891 Unspecified atrial fibrillation: Secondary | ICD-10-CM | POA: Diagnosis not present

## 2018-04-27 DIAGNOSIS — I639 Cerebral infarction, unspecified: Secondary | ICD-10-CM | POA: Diagnosis not present

## 2018-04-28 DIAGNOSIS — I639 Cerebral infarction, unspecified: Secondary | ICD-10-CM | POA: Diagnosis not present

## 2018-04-28 DIAGNOSIS — I4891 Unspecified atrial fibrillation: Secondary | ICD-10-CM | POA: Diagnosis not present

## 2018-04-29 DIAGNOSIS — S329XXA Fracture of unspecified parts of lumbosacral spine and pelvis, initial encounter for closed fracture: Secondary | ICD-10-CM | POA: Diagnosis not present

## 2018-04-29 DIAGNOSIS — I4891 Unspecified atrial fibrillation: Secondary | ICD-10-CM | POA: Diagnosis not present

## 2018-04-29 DIAGNOSIS — M353 Polymyalgia rheumatica: Secondary | ICD-10-CM | POA: Diagnosis not present

## 2018-04-29 DIAGNOSIS — J449 Chronic obstructive pulmonary disease, unspecified: Secondary | ICD-10-CM | POA: Diagnosis not present

## 2018-04-29 DIAGNOSIS — I639 Cerebral infarction, unspecified: Secondary | ICD-10-CM | POA: Diagnosis not present

## 2018-04-30 DIAGNOSIS — I639 Cerebral infarction, unspecified: Secondary | ICD-10-CM | POA: Diagnosis not present

## 2018-04-30 DIAGNOSIS — I4891 Unspecified atrial fibrillation: Secondary | ICD-10-CM | POA: Diagnosis not present

## 2018-05-01 DIAGNOSIS — I639 Cerebral infarction, unspecified: Secondary | ICD-10-CM | POA: Diagnosis not present

## 2018-05-01 DIAGNOSIS — I4891 Unspecified atrial fibrillation: Secondary | ICD-10-CM | POA: Diagnosis not present

## 2018-05-03 DIAGNOSIS — I4891 Unspecified atrial fibrillation: Secondary | ICD-10-CM | POA: Diagnosis not present

## 2018-05-03 DIAGNOSIS — I639 Cerebral infarction, unspecified: Secondary | ICD-10-CM | POA: Diagnosis not present

## 2018-05-04 DIAGNOSIS — I639 Cerebral infarction, unspecified: Secondary | ICD-10-CM | POA: Diagnosis not present

## 2018-05-04 DIAGNOSIS — I4891 Unspecified atrial fibrillation: Secondary | ICD-10-CM | POA: Diagnosis not present

## 2018-05-05 DIAGNOSIS — I4891 Unspecified atrial fibrillation: Secondary | ICD-10-CM | POA: Diagnosis not present

## 2018-05-05 DIAGNOSIS — I639 Cerebral infarction, unspecified: Secondary | ICD-10-CM | POA: Diagnosis not present

## 2018-05-06 DIAGNOSIS — I4891 Unspecified atrial fibrillation: Secondary | ICD-10-CM | POA: Diagnosis not present

## 2018-05-06 DIAGNOSIS — I639 Cerebral infarction, unspecified: Secondary | ICD-10-CM | POA: Diagnosis not present

## 2018-05-07 DIAGNOSIS — I639 Cerebral infarction, unspecified: Secondary | ICD-10-CM | POA: Diagnosis not present

## 2018-05-07 DIAGNOSIS — I4891 Unspecified atrial fibrillation: Secondary | ICD-10-CM | POA: Diagnosis not present

## 2018-05-08 DIAGNOSIS — I639 Cerebral infarction, unspecified: Secondary | ICD-10-CM | POA: Diagnosis not present

## 2018-05-08 DIAGNOSIS — I4891 Unspecified atrial fibrillation: Secondary | ICD-10-CM | POA: Diagnosis not present

## 2018-05-09 DIAGNOSIS — I639 Cerebral infarction, unspecified: Secondary | ICD-10-CM | POA: Diagnosis not present

## 2018-05-09 DIAGNOSIS — I4891 Unspecified atrial fibrillation: Secondary | ICD-10-CM | POA: Diagnosis not present

## 2018-05-10 ENCOUNTER — Other Ambulatory Visit: Payer: Self-pay | Admitting: Student in an Organized Health Care Education/Training Program

## 2018-05-10 DIAGNOSIS — I639 Cerebral infarction, unspecified: Secondary | ICD-10-CM | POA: Diagnosis not present

## 2018-05-10 DIAGNOSIS — I4891 Unspecified atrial fibrillation: Secondary | ICD-10-CM | POA: Diagnosis not present

## 2018-05-10 DIAGNOSIS — I480 Paroxysmal atrial fibrillation: Secondary | ICD-10-CM

## 2018-05-12 DIAGNOSIS — I4891 Unspecified atrial fibrillation: Secondary | ICD-10-CM | POA: Diagnosis not present

## 2018-05-12 DIAGNOSIS — I639 Cerebral infarction, unspecified: Secondary | ICD-10-CM | POA: Diagnosis not present

## 2018-05-13 DIAGNOSIS — I639 Cerebral infarction, unspecified: Secondary | ICD-10-CM | POA: Diagnosis not present

## 2018-05-13 DIAGNOSIS — M353 Polymyalgia rheumatica: Secondary | ICD-10-CM | POA: Diagnosis not present

## 2018-05-13 DIAGNOSIS — S329XXA Fracture of unspecified parts of lumbosacral spine and pelvis, initial encounter for closed fracture: Secondary | ICD-10-CM | POA: Diagnosis not present

## 2018-05-13 DIAGNOSIS — I4891 Unspecified atrial fibrillation: Secondary | ICD-10-CM | POA: Diagnosis not present

## 2018-05-14 DIAGNOSIS — I4891 Unspecified atrial fibrillation: Secondary | ICD-10-CM | POA: Diagnosis not present

## 2018-05-14 DIAGNOSIS — I639 Cerebral infarction, unspecified: Secondary | ICD-10-CM | POA: Diagnosis not present

## 2018-05-15 DIAGNOSIS — I639 Cerebral infarction, unspecified: Secondary | ICD-10-CM | POA: Diagnosis not present

## 2018-05-15 DIAGNOSIS — I4891 Unspecified atrial fibrillation: Secondary | ICD-10-CM | POA: Diagnosis not present

## 2018-05-16 DIAGNOSIS — I639 Cerebral infarction, unspecified: Secondary | ICD-10-CM | POA: Diagnosis not present

## 2018-05-16 DIAGNOSIS — I4891 Unspecified atrial fibrillation: Secondary | ICD-10-CM | POA: Diagnosis not present

## 2018-05-17 DIAGNOSIS — I639 Cerebral infarction, unspecified: Secondary | ICD-10-CM | POA: Diagnosis not present

## 2018-05-17 DIAGNOSIS — I4891 Unspecified atrial fibrillation: Secondary | ICD-10-CM | POA: Diagnosis not present

## 2018-05-18 DIAGNOSIS — I639 Cerebral infarction, unspecified: Secondary | ICD-10-CM | POA: Diagnosis not present

## 2018-05-18 DIAGNOSIS — I4891 Unspecified atrial fibrillation: Secondary | ICD-10-CM | POA: Diagnosis not present

## 2018-05-19 DIAGNOSIS — I4891 Unspecified atrial fibrillation: Secondary | ICD-10-CM | POA: Diagnosis not present

## 2018-05-19 DIAGNOSIS — I639 Cerebral infarction, unspecified: Secondary | ICD-10-CM | POA: Diagnosis not present

## 2018-05-20 DIAGNOSIS — I4891 Unspecified atrial fibrillation: Secondary | ICD-10-CM | POA: Diagnosis not present

## 2018-05-20 DIAGNOSIS — I639 Cerebral infarction, unspecified: Secondary | ICD-10-CM | POA: Diagnosis not present

## 2018-05-21 DIAGNOSIS — I639 Cerebral infarction, unspecified: Secondary | ICD-10-CM | POA: Diagnosis not present

## 2018-05-21 DIAGNOSIS — I4891 Unspecified atrial fibrillation: Secondary | ICD-10-CM | POA: Diagnosis not present

## 2018-05-22 DIAGNOSIS — I639 Cerebral infarction, unspecified: Secondary | ICD-10-CM | POA: Diagnosis not present

## 2018-05-22 DIAGNOSIS — I4891 Unspecified atrial fibrillation: Secondary | ICD-10-CM | POA: Diagnosis not present

## 2018-05-23 DIAGNOSIS — R2689 Other abnormalities of gait and mobility: Secondary | ICD-10-CM | POA: Diagnosis not present

## 2018-05-24 DIAGNOSIS — I4891 Unspecified atrial fibrillation: Secondary | ICD-10-CM | POA: Diagnosis not present

## 2018-05-24 DIAGNOSIS — I639 Cerebral infarction, unspecified: Secondary | ICD-10-CM | POA: Diagnosis not present

## 2018-05-25 DIAGNOSIS — I639 Cerebral infarction, unspecified: Secondary | ICD-10-CM | POA: Diagnosis not present

## 2018-05-25 DIAGNOSIS — I4891 Unspecified atrial fibrillation: Secondary | ICD-10-CM | POA: Diagnosis not present

## 2018-05-26 DIAGNOSIS — I639 Cerebral infarction, unspecified: Secondary | ICD-10-CM | POA: Diagnosis not present

## 2018-05-26 DIAGNOSIS — I4891 Unspecified atrial fibrillation: Secondary | ICD-10-CM | POA: Diagnosis not present

## 2018-05-27 DIAGNOSIS — I4891 Unspecified atrial fibrillation: Secondary | ICD-10-CM | POA: Diagnosis not present

## 2018-05-27 DIAGNOSIS — I639 Cerebral infarction, unspecified: Secondary | ICD-10-CM | POA: Diagnosis not present

## 2018-05-28 DIAGNOSIS — I4891 Unspecified atrial fibrillation: Secondary | ICD-10-CM | POA: Diagnosis not present

## 2018-05-28 DIAGNOSIS — I639 Cerebral infarction, unspecified: Secondary | ICD-10-CM | POA: Diagnosis not present

## 2018-05-29 DIAGNOSIS — I4891 Unspecified atrial fibrillation: Secondary | ICD-10-CM | POA: Diagnosis not present

## 2018-05-29 DIAGNOSIS — I639 Cerebral infarction, unspecified: Secondary | ICD-10-CM | POA: Diagnosis not present

## 2018-05-30 DIAGNOSIS — I639 Cerebral infarction, unspecified: Secondary | ICD-10-CM | POA: Diagnosis not present

## 2018-05-30 DIAGNOSIS — I4891 Unspecified atrial fibrillation: Secondary | ICD-10-CM | POA: Diagnosis not present

## 2018-06-08 DIAGNOSIS — I4891 Unspecified atrial fibrillation: Secondary | ICD-10-CM | POA: Diagnosis not present

## 2018-06-08 DIAGNOSIS — I639 Cerebral infarction, unspecified: Secondary | ICD-10-CM | POA: Diagnosis not present

## 2018-06-10 DIAGNOSIS — I639 Cerebral infarction, unspecified: Secondary | ICD-10-CM | POA: Diagnosis not present

## 2018-06-10 DIAGNOSIS — I4891 Unspecified atrial fibrillation: Secondary | ICD-10-CM | POA: Diagnosis not present

## 2018-06-11 DIAGNOSIS — I639 Cerebral infarction, unspecified: Secondary | ICD-10-CM | POA: Diagnosis not present

## 2018-06-11 DIAGNOSIS — I4891 Unspecified atrial fibrillation: Secondary | ICD-10-CM | POA: Diagnosis not present

## 2018-06-12 DIAGNOSIS — I639 Cerebral infarction, unspecified: Secondary | ICD-10-CM | POA: Diagnosis not present

## 2018-06-12 DIAGNOSIS — I4891 Unspecified atrial fibrillation: Secondary | ICD-10-CM | POA: Diagnosis not present

## 2018-06-13 DIAGNOSIS — I639 Cerebral infarction, unspecified: Secondary | ICD-10-CM | POA: Diagnosis not present

## 2018-06-13 DIAGNOSIS — I4891 Unspecified atrial fibrillation: Secondary | ICD-10-CM | POA: Diagnosis not present

## 2018-06-14 DIAGNOSIS — I639 Cerebral infarction, unspecified: Secondary | ICD-10-CM | POA: Diagnosis not present

## 2018-06-14 DIAGNOSIS — I4891 Unspecified atrial fibrillation: Secondary | ICD-10-CM | POA: Diagnosis not present

## 2018-06-15 DIAGNOSIS — I4891 Unspecified atrial fibrillation: Secondary | ICD-10-CM | POA: Diagnosis not present

## 2018-06-15 DIAGNOSIS — I639 Cerebral infarction, unspecified: Secondary | ICD-10-CM | POA: Diagnosis not present

## 2018-06-16 DIAGNOSIS — I639 Cerebral infarction, unspecified: Secondary | ICD-10-CM | POA: Diagnosis not present

## 2018-06-16 DIAGNOSIS — I4891 Unspecified atrial fibrillation: Secondary | ICD-10-CM | POA: Diagnosis not present

## 2018-06-19 DIAGNOSIS — I639 Cerebral infarction, unspecified: Secondary | ICD-10-CM | POA: Diagnosis not present

## 2018-06-19 DIAGNOSIS — I4891 Unspecified atrial fibrillation: Secondary | ICD-10-CM | POA: Diagnosis not present

## 2018-06-20 DIAGNOSIS — I639 Cerebral infarction, unspecified: Secondary | ICD-10-CM | POA: Diagnosis not present

## 2018-06-20 DIAGNOSIS — I4891 Unspecified atrial fibrillation: Secondary | ICD-10-CM | POA: Diagnosis not present

## 2018-06-21 DIAGNOSIS — I4891 Unspecified atrial fibrillation: Secondary | ICD-10-CM | POA: Diagnosis not present

## 2018-06-21 DIAGNOSIS — I639 Cerebral infarction, unspecified: Secondary | ICD-10-CM | POA: Diagnosis not present

## 2018-06-22 DIAGNOSIS — I639 Cerebral infarction, unspecified: Secondary | ICD-10-CM | POA: Diagnosis not present

## 2018-06-22 DIAGNOSIS — I4891 Unspecified atrial fibrillation: Secondary | ICD-10-CM | POA: Diagnosis not present

## 2018-06-26 DIAGNOSIS — I4891 Unspecified atrial fibrillation: Secondary | ICD-10-CM | POA: Diagnosis not present

## 2018-06-26 DIAGNOSIS — I639 Cerebral infarction, unspecified: Secondary | ICD-10-CM | POA: Diagnosis not present

## 2018-06-27 DIAGNOSIS — I4891 Unspecified atrial fibrillation: Secondary | ICD-10-CM | POA: Diagnosis not present

## 2018-06-27 DIAGNOSIS — I639 Cerebral infarction, unspecified: Secondary | ICD-10-CM | POA: Diagnosis not present

## 2018-06-28 DIAGNOSIS — S329XXA Fracture of unspecified parts of lumbosacral spine and pelvis, initial encounter for closed fracture: Secondary | ICD-10-CM | POA: Diagnosis not present

## 2018-06-29 DIAGNOSIS — I4891 Unspecified atrial fibrillation: Secondary | ICD-10-CM | POA: Diagnosis not present

## 2018-06-29 DIAGNOSIS — I639 Cerebral infarction, unspecified: Secondary | ICD-10-CM | POA: Diagnosis not present

## 2018-07-03 ENCOUNTER — Other Ambulatory Visit: Payer: Self-pay | Admitting: Student in an Organized Health Care Education/Training Program

## 2018-07-03 DIAGNOSIS — F01518 Vascular dementia, unspecified severity, with other behavioral disturbance: Secondary | ICD-10-CM

## 2018-07-03 DIAGNOSIS — F0151 Vascular dementia with behavioral disturbance: Secondary | ICD-10-CM

## 2018-07-05 DIAGNOSIS — I639 Cerebral infarction, unspecified: Secondary | ICD-10-CM | POA: Diagnosis not present

## 2018-07-05 DIAGNOSIS — I4891 Unspecified atrial fibrillation: Secondary | ICD-10-CM | POA: Diagnosis not present

## 2018-07-06 DIAGNOSIS — I4891 Unspecified atrial fibrillation: Secondary | ICD-10-CM | POA: Diagnosis not present

## 2018-07-06 DIAGNOSIS — I639 Cerebral infarction, unspecified: Secondary | ICD-10-CM | POA: Diagnosis not present

## 2018-07-08 DIAGNOSIS — I4891 Unspecified atrial fibrillation: Secondary | ICD-10-CM | POA: Diagnosis not present

## 2018-07-08 DIAGNOSIS — I639 Cerebral infarction, unspecified: Secondary | ICD-10-CM | POA: Diagnosis not present

## 2018-07-09 DIAGNOSIS — I4891 Unspecified atrial fibrillation: Secondary | ICD-10-CM | POA: Diagnosis not present

## 2018-07-09 DIAGNOSIS — I639 Cerebral infarction, unspecified: Secondary | ICD-10-CM | POA: Diagnosis not present

## 2018-07-10 DIAGNOSIS — I639 Cerebral infarction, unspecified: Secondary | ICD-10-CM | POA: Diagnosis not present

## 2018-07-10 DIAGNOSIS — I4891 Unspecified atrial fibrillation: Secondary | ICD-10-CM | POA: Diagnosis not present

## 2018-07-11 DIAGNOSIS — I4891 Unspecified atrial fibrillation: Secondary | ICD-10-CM | POA: Diagnosis not present

## 2018-07-11 DIAGNOSIS — I639 Cerebral infarction, unspecified: Secondary | ICD-10-CM | POA: Diagnosis not present

## 2018-07-12 DIAGNOSIS — I639 Cerebral infarction, unspecified: Secondary | ICD-10-CM | POA: Diagnosis not present

## 2018-07-12 DIAGNOSIS — I4891 Unspecified atrial fibrillation: Secondary | ICD-10-CM | POA: Diagnosis not present

## 2018-07-13 DIAGNOSIS — I639 Cerebral infarction, unspecified: Secondary | ICD-10-CM | POA: Diagnosis not present

## 2018-07-13 DIAGNOSIS — I4891 Unspecified atrial fibrillation: Secondary | ICD-10-CM | POA: Diagnosis not present

## 2018-07-14 DIAGNOSIS — I639 Cerebral infarction, unspecified: Secondary | ICD-10-CM | POA: Diagnosis not present

## 2018-07-14 DIAGNOSIS — I4891 Unspecified atrial fibrillation: Secondary | ICD-10-CM | POA: Diagnosis not present

## 2018-07-15 DIAGNOSIS — I4891 Unspecified atrial fibrillation: Secondary | ICD-10-CM | POA: Diagnosis not present

## 2018-07-15 DIAGNOSIS — I639 Cerebral infarction, unspecified: Secondary | ICD-10-CM | POA: Diagnosis not present

## 2018-07-16 DIAGNOSIS — I639 Cerebral infarction, unspecified: Secondary | ICD-10-CM | POA: Diagnosis not present

## 2018-07-16 DIAGNOSIS — I4891 Unspecified atrial fibrillation: Secondary | ICD-10-CM | POA: Diagnosis not present

## 2018-07-17 DIAGNOSIS — I4891 Unspecified atrial fibrillation: Secondary | ICD-10-CM | POA: Diagnosis not present

## 2018-07-17 DIAGNOSIS — I639 Cerebral infarction, unspecified: Secondary | ICD-10-CM | POA: Diagnosis not present

## 2018-07-18 DIAGNOSIS — I4891 Unspecified atrial fibrillation: Secondary | ICD-10-CM | POA: Diagnosis not present

## 2018-07-18 DIAGNOSIS — I639 Cerebral infarction, unspecified: Secondary | ICD-10-CM | POA: Diagnosis not present

## 2018-07-19 DIAGNOSIS — I4891 Unspecified atrial fibrillation: Secondary | ICD-10-CM | POA: Diagnosis not present

## 2018-07-19 DIAGNOSIS — I639 Cerebral infarction, unspecified: Secondary | ICD-10-CM | POA: Diagnosis not present

## 2018-07-20 DIAGNOSIS — I639 Cerebral infarction, unspecified: Secondary | ICD-10-CM | POA: Diagnosis not present

## 2018-07-20 DIAGNOSIS — I4891 Unspecified atrial fibrillation: Secondary | ICD-10-CM | POA: Diagnosis not present

## 2018-07-22 DIAGNOSIS — I4891 Unspecified atrial fibrillation: Secondary | ICD-10-CM | POA: Diagnosis not present

## 2018-07-22 DIAGNOSIS — I639 Cerebral infarction, unspecified: Secondary | ICD-10-CM | POA: Diagnosis not present

## 2018-07-23 DIAGNOSIS — I639 Cerebral infarction, unspecified: Secondary | ICD-10-CM | POA: Diagnosis not present

## 2018-07-23 DIAGNOSIS — I4891 Unspecified atrial fibrillation: Secondary | ICD-10-CM | POA: Diagnosis not present

## 2018-07-24 DIAGNOSIS — I639 Cerebral infarction, unspecified: Secondary | ICD-10-CM | POA: Diagnosis not present

## 2018-07-24 DIAGNOSIS — I4891 Unspecified atrial fibrillation: Secondary | ICD-10-CM | POA: Diagnosis not present

## 2018-07-25 DIAGNOSIS — I4891 Unspecified atrial fibrillation: Secondary | ICD-10-CM | POA: Diagnosis not present

## 2018-07-25 DIAGNOSIS — I639 Cerebral infarction, unspecified: Secondary | ICD-10-CM | POA: Diagnosis not present

## 2018-07-26 DIAGNOSIS — I639 Cerebral infarction, unspecified: Secondary | ICD-10-CM | POA: Diagnosis not present

## 2018-07-26 DIAGNOSIS — I4891 Unspecified atrial fibrillation: Secondary | ICD-10-CM | POA: Diagnosis not present

## 2018-07-27 DIAGNOSIS — I4891 Unspecified atrial fibrillation: Secondary | ICD-10-CM | POA: Diagnosis not present

## 2018-07-27 DIAGNOSIS — I639 Cerebral infarction, unspecified: Secondary | ICD-10-CM | POA: Diagnosis not present

## 2018-07-28 DIAGNOSIS — I4891 Unspecified atrial fibrillation: Secondary | ICD-10-CM | POA: Diagnosis not present

## 2018-07-28 DIAGNOSIS — I639 Cerebral infarction, unspecified: Secondary | ICD-10-CM | POA: Diagnosis not present

## 2018-07-29 DIAGNOSIS — I4891 Unspecified atrial fibrillation: Secondary | ICD-10-CM | POA: Diagnosis not present

## 2018-07-29 DIAGNOSIS — I639 Cerebral infarction, unspecified: Secondary | ICD-10-CM | POA: Diagnosis not present

## 2018-07-30 DIAGNOSIS — I4891 Unspecified atrial fibrillation: Secondary | ICD-10-CM | POA: Diagnosis not present

## 2018-07-30 DIAGNOSIS — I639 Cerebral infarction, unspecified: Secondary | ICD-10-CM | POA: Diagnosis not present

## 2018-07-31 DIAGNOSIS — I4891 Unspecified atrial fibrillation: Secondary | ICD-10-CM | POA: Diagnosis not present

## 2018-07-31 DIAGNOSIS — I639 Cerebral infarction, unspecified: Secondary | ICD-10-CM | POA: Diagnosis not present

## 2018-08-01 DIAGNOSIS — I4891 Unspecified atrial fibrillation: Secondary | ICD-10-CM | POA: Diagnosis not present

## 2018-08-01 DIAGNOSIS — I639 Cerebral infarction, unspecified: Secondary | ICD-10-CM | POA: Diagnosis not present

## 2018-08-02 DIAGNOSIS — I4891 Unspecified atrial fibrillation: Secondary | ICD-10-CM | POA: Diagnosis not present

## 2018-08-02 DIAGNOSIS — I639 Cerebral infarction, unspecified: Secondary | ICD-10-CM | POA: Diagnosis not present

## 2018-08-03 DIAGNOSIS — I639 Cerebral infarction, unspecified: Secondary | ICD-10-CM | POA: Diagnosis not present

## 2018-08-03 DIAGNOSIS — I4891 Unspecified atrial fibrillation: Secondary | ICD-10-CM | POA: Diagnosis not present

## 2018-08-04 DIAGNOSIS — I4891 Unspecified atrial fibrillation: Secondary | ICD-10-CM | POA: Diagnosis not present

## 2018-08-04 DIAGNOSIS — I639 Cerebral infarction, unspecified: Secondary | ICD-10-CM | POA: Diagnosis not present

## 2018-08-07 DIAGNOSIS — I639 Cerebral infarction, unspecified: Secondary | ICD-10-CM | POA: Diagnosis not present

## 2018-08-07 DIAGNOSIS — I4891 Unspecified atrial fibrillation: Secondary | ICD-10-CM | POA: Diagnosis not present

## 2018-08-08 DIAGNOSIS — I4891 Unspecified atrial fibrillation: Secondary | ICD-10-CM | POA: Diagnosis not present

## 2018-08-08 DIAGNOSIS — I639 Cerebral infarction, unspecified: Secondary | ICD-10-CM | POA: Diagnosis not present

## 2018-08-10 DIAGNOSIS — I639 Cerebral infarction, unspecified: Secondary | ICD-10-CM | POA: Diagnosis not present

## 2018-08-10 DIAGNOSIS — I4891 Unspecified atrial fibrillation: Secondary | ICD-10-CM | POA: Diagnosis not present

## 2018-08-11 ENCOUNTER — Ambulatory Visit (INDEPENDENT_AMBULATORY_CARE_PROVIDER_SITE_OTHER): Payer: Medicare Other | Admitting: Student in an Organized Health Care Education/Training Program

## 2018-08-11 ENCOUNTER — Other Ambulatory Visit: Payer: Self-pay

## 2018-08-11 ENCOUNTER — Encounter: Payer: Self-pay | Admitting: Student in an Organized Health Care Education/Training Program

## 2018-08-11 VITALS — BP 138/70 | HR 77 | Temp 98.0°F | Wt 142.0 lb

## 2018-08-11 DIAGNOSIS — I10 Essential (primary) hypertension: Secondary | ICD-10-CM | POA: Diagnosis not present

## 2018-08-11 DIAGNOSIS — F0151 Vascular dementia with behavioral disturbance: Secondary | ICD-10-CM | POA: Diagnosis not present

## 2018-08-11 DIAGNOSIS — I4891 Unspecified atrial fibrillation: Secondary | ICD-10-CM

## 2018-08-11 DIAGNOSIS — I480 Paroxysmal atrial fibrillation: Secondary | ICD-10-CM | POA: Diagnosis not present

## 2018-08-11 DIAGNOSIS — F01518 Vascular dementia, unspecified severity, with other behavioral disturbance: Secondary | ICD-10-CM

## 2018-08-11 DIAGNOSIS — I639 Cerebral infarction, unspecified: Secondary | ICD-10-CM | POA: Diagnosis not present

## 2018-08-11 MED ORDER — VITAMIN D 25 MCG (1000 UNIT) PO TABS: 1000.0000 [IU] | ORAL_TABLET | Freq: Every day | ORAL | 3 refills | Status: DC

## 2018-08-11 MED ORDER — APIXABAN 2.5 MG PO TABS: 2.5000 mg | ORAL_TABLET | Freq: Two times a day (BID) | ORAL | 3 refills | Status: DC

## 2018-08-11 MED ORDER — APIXABAN 2.5 MG PO TABS: 2.5000 mg | ORAL_TABLET | Freq: Two times a day (BID) | ORAL | 0 refills | Status: DC

## 2018-08-11 MED ORDER — QUETIAPINE FUMARATE 50 MG PO TABS: 100.0000 mg | ORAL_TABLET | Freq: Every day | ORAL | 0 refills | Status: DC

## 2018-08-11 MED ORDER — CARVEDILOL 6.25 MG PO TABS: 6.2500 mg | ORAL_TABLET | Freq: Two times a day (BID) | ORAL | 3 refills | Status: DC

## 2018-08-11 NOTE — Progress Notes (Signed)
Subjective:    Patient ID: [name], female    DOB: 1927-08-01, 85 y.o.   MRN: [id number]   CC: f/u BP and medication refill  HPI: Patient unable to provide history. Husband keeps track of all of her medications and administers them. He states that she has not had her eliquis in 3 weeks. He's not exactly sure why, but states that the pharmacy couldn't fill the prescription for that time.  Patient mental status appears to be at baseline.  Patient has recently gotten hearing aids which has greatly improved her hearing and ability to be involved in our conversations. Husband requested a new bedside commode as their current one is unsteady.  Smoking status reviewed   ROS: pertinent noted in the HPI   Past Medical History:  Diagnosis Date  . A-fib ([place])   . Anemia    anemia of renal disease  . Arthritis    mutiple joints  . Cancer [facility])    Renal neoplasm- survellience via Urology  . CHF (congestive heart failure) ([place])    Diastolic dys-- EF 44% july 2011  . Chronic kidney disease    Stage III- followed by [facility]  . COPD (chronic obstructive pulmonary disease) ([place])   . Dementia, vascular, mixed, with behavioral disturbance ([place]) 01/31/2016  . Diabetes mellitus   . DIASTOLIC DYSFUNCTION 09/12/4006   Qualifier: Diagnosis of  By: Buelah Manis MD, Lonell Grandchild    . GERD (gastroesophageal reflux disease)   . H/O hiatal hernia   . Hyperlipidemia   . Hypertension   . Left bundle branch block   . NEOPLASM, KIDNEY 03/04/2007   complex right renal cyst; Per patient- Urologist no longer following- no changes.      . Nonischemic cardiomyopathy ([place])   . Peripheral neuropathy   . Pneumonia   . Shortness of breath    Hx: of with exertion  . Venous insufficiency 02/24/2011    Past Surgical History:  Procedure Laterality Date  . ABDOMINAL HYSTERECTOMY    . CARDIAC CATHETERIZATION  08/12/2011   heavily calcified L main; LAD with no stenosis; 1st diagonal free of stenosis; L Cfx with no  stenosis; OM1/2 without stenosis; PDA without stenosis; RCA without significant stenosis; non-ischemic cardiomyopathy (Dr. Kathy Breach)  . CATARACT EXTRACTION W/ INTRAOCULAR LENS  IMPLANT, BILATERAL    . CHOLECYSTECTOMY  1987  . DILATION AND CURETTAGE OF UTERUS    . HIP ARTHROPLASTY Right 03/08/2017   Procedure: ARTHROPLASTY BIPOLAR HIP (HEMIARTHROPLASTY);  Surgeon: Paralee Cancel, MD;  [facility];  Service: Orthopedics;  Laterality: Right;  . LEFT HEART CATHETERIZATION WITH CORONARY ANGIOGRAM N/A 08/12/2011   Procedure: LEFT HEART CATHETERIZATION WITH CORONARY ANGIOGRAM;  Surgeon: Pixie Casino, MD;  Location: [facility] CATH LAB;  Service: Cardiovascular;  Laterality: N/A;  . LUMBAR LAMINECTOMY/DECOMPRESSION MICRODISCECTOMY Left 05/12/2013   Procedure: Left Lumbar four-five laminotomy and microdiskectomy;  Surgeon: Hosie Spangle, MD;  Location: [place] NEURO ORS;  Service: Neurosurgery;  Laterality: Left;  Left Lumbar four-five laminotomy and microdiskectomy  . NM MYOCAR PERF WALL MOTION  05/2011   lexiscan myoview - fixed septal defect r/t LBBB; normal pattern of perfusion in all regions; low risk scan  . TONSILLECTOMY    . TRANSTHORACIC ECHOCARDIOGRAM  09/2011   EF 60-65%; MV mod-severely calcified, mildly thickened MV annulus, mild MR; LA mod dilated; RA mildly dilated    Past medical history, surgical, family, and social history reviewed and updated in the EMR as appropriate.  Objective:  BP 138/70  Pulse 77   Temp 98 F (36.7 C) (Oral)   Wt 142 lb (64.4 kg)   SpO2 99%   BMI 22.24 kg/m   Vitals and nursing note reviewed  General: NAD, pleasant, able to participate in exam Cardiac: RRR, S1 S2 present. normal heart sounds, no murmurs. Respiratory: CTAB, normal effort, No wheezes, rales or rhonchi Extremities: no edema or cyanosis. Skin: warm and dry, no rashes noted Neuro: alert, no obvious focal deficits Psych: Normal affect and mood   Assessment & Plan:    Essential  hypertension 138/70 in office today Well controlled on current regimen   Atrial fibrillation ([place]) HR 77 on presentation Patient has been without her eliquis for 3 weeks.  No focal deficits. Mental status at baseline. Contacted pharmacy to inquire what caused the lapse of medication and how we can make sure it doesn't happen again. In speaking with the patient's husband, he decided to move all their prescriptions to a new pharmacy. I refilled the eliquis with 3 refills.  I would like to check her kidney function at next appointment and modify medications in accordance    Doristine Mango, [place] PGY-1

## 2018-08-11 NOTE — Patient Instructions (Addendum)
It was a pleasure to see you today!  To summarize our discussion for this visit:  Blood pressure looks good on current medicine, no changes  We prescribed eliquis and switched pharmacies so it will not run out again  Ordered a commode  Call the clinic at [phone] if your symptoms worsen or you have any concerns.  Thank you for allowing me to take part in your care,  Dr. Doristine Mango   Thanks for choosing [facility] for your primary care.

## 2018-08-12 DIAGNOSIS — I4891 Unspecified atrial fibrillation: Secondary | ICD-10-CM | POA: Diagnosis not present

## 2018-08-12 DIAGNOSIS — I639 Cerebral infarction, unspecified: Secondary | ICD-10-CM | POA: Diagnosis not present

## 2018-08-12 NOTE — Assessment & Plan Note (Signed)
HR 77 on presentation Patient has been without her eliquis for 3 weeks.  No focal deficits. Mental status at baseline. Contacted pharmacy to inquire what caused the lapse of medication and how we can make sure it doesn't happen again. In speaking with the patient's husband, he decided to move all their prescriptions to a new pharmacy. I refilled the eliquis with 3 refills.

## 2018-08-12 NOTE — Assessment & Plan Note (Signed)
138/70 in office today Well controlled on current regimen

## 2018-08-13 DIAGNOSIS — I639 Cerebral infarction, unspecified: Secondary | ICD-10-CM | POA: Diagnosis not present

## 2018-08-13 DIAGNOSIS — I4891 Unspecified atrial fibrillation: Secondary | ICD-10-CM | POA: Diagnosis not present

## 2018-08-14 DIAGNOSIS — I4891 Unspecified atrial fibrillation: Secondary | ICD-10-CM | POA: Diagnosis not present

## 2018-08-14 DIAGNOSIS — I639 Cerebral infarction, unspecified: Secondary | ICD-10-CM | POA: Diagnosis not present

## 2018-08-15 DIAGNOSIS — I639 Cerebral infarction, unspecified: Secondary | ICD-10-CM | POA: Diagnosis not present

## 2018-08-15 DIAGNOSIS — I4891 Unspecified atrial fibrillation: Secondary | ICD-10-CM | POA: Diagnosis not present

## 2018-08-18 DIAGNOSIS — I4891 Unspecified atrial fibrillation: Secondary | ICD-10-CM | POA: Diagnosis not present

## 2018-08-18 DIAGNOSIS — I639 Cerebral infarction, unspecified: Secondary | ICD-10-CM | POA: Diagnosis not present

## 2018-08-19 DIAGNOSIS — I4891 Unspecified atrial fibrillation: Secondary | ICD-10-CM | POA: Diagnosis not present

## 2018-08-19 DIAGNOSIS — I639 Cerebral infarction, unspecified: Secondary | ICD-10-CM | POA: Diagnosis not present

## 2018-08-20 DIAGNOSIS — I639 Cerebral infarction, unspecified: Secondary | ICD-10-CM | POA: Diagnosis not present

## 2018-08-20 DIAGNOSIS — I4891 Unspecified atrial fibrillation: Secondary | ICD-10-CM | POA: Diagnosis not present

## 2018-08-21 DIAGNOSIS — I4891 Unspecified atrial fibrillation: Secondary | ICD-10-CM | POA: Diagnosis not present

## 2018-08-21 DIAGNOSIS — I639 Cerebral infarction, unspecified: Secondary | ICD-10-CM | POA: Diagnosis not present

## 2018-08-22 DIAGNOSIS — I639 Cerebral infarction, unspecified: Secondary | ICD-10-CM | POA: Diagnosis not present

## 2018-08-22 DIAGNOSIS — I4891 Unspecified atrial fibrillation: Secondary | ICD-10-CM | POA: Diagnosis not present

## 2018-08-23 DIAGNOSIS — I4891 Unspecified atrial fibrillation: Secondary | ICD-10-CM | POA: Diagnosis not present

## 2018-08-23 DIAGNOSIS — I639 Cerebral infarction, unspecified: Secondary | ICD-10-CM | POA: Diagnosis not present

## 2018-08-25 DIAGNOSIS — I639 Cerebral infarction, unspecified: Secondary | ICD-10-CM | POA: Diagnosis not present

## 2018-08-25 DIAGNOSIS — I4891 Unspecified atrial fibrillation: Secondary | ICD-10-CM | POA: Diagnosis not present

## 2018-08-26 DIAGNOSIS — I4891 Unspecified atrial fibrillation: Secondary | ICD-10-CM | POA: Diagnosis not present

## 2018-08-26 DIAGNOSIS — I639 Cerebral infarction, unspecified: Secondary | ICD-10-CM | POA: Diagnosis not present

## 2018-08-27 DIAGNOSIS — I639 Cerebral infarction, unspecified: Secondary | ICD-10-CM | POA: Diagnosis not present

## 2018-08-27 DIAGNOSIS — I4891 Unspecified atrial fibrillation: Secondary | ICD-10-CM | POA: Diagnosis not present

## 2018-08-30 DIAGNOSIS — I639 Cerebral infarction, unspecified: Secondary | ICD-10-CM | POA: Diagnosis not present

## 2018-08-30 DIAGNOSIS — I4891 Unspecified atrial fibrillation: Secondary | ICD-10-CM | POA: Diagnosis not present

## 2018-08-31 DIAGNOSIS — I4891 Unspecified atrial fibrillation: Secondary | ICD-10-CM | POA: Diagnosis not present

## 2018-08-31 DIAGNOSIS — I639 Cerebral infarction, unspecified: Secondary | ICD-10-CM | POA: Diagnosis not present

## 2018-09-01 DIAGNOSIS — I4891 Unspecified atrial fibrillation: Secondary | ICD-10-CM | POA: Diagnosis not present

## 2018-09-01 DIAGNOSIS — I639 Cerebral infarction, unspecified: Secondary | ICD-10-CM | POA: Diagnosis not present

## 2018-09-01 DIAGNOSIS — S329XXA Fracture of unspecified parts of lumbosacral spine and pelvis, initial encounter for closed fracture: Secondary | ICD-10-CM | POA: Diagnosis not present

## 2018-09-01 DIAGNOSIS — M353 Polymyalgia rheumatica: Secondary | ICD-10-CM | POA: Diagnosis not present

## 2018-09-02 DIAGNOSIS — I639 Cerebral infarction, unspecified: Secondary | ICD-10-CM | POA: Diagnosis not present

## 2018-09-02 DIAGNOSIS — I4891 Unspecified atrial fibrillation: Secondary | ICD-10-CM | POA: Diagnosis not present

## 2018-09-03 DIAGNOSIS — I639 Cerebral infarction, unspecified: Secondary | ICD-10-CM | POA: Diagnosis not present

## 2018-09-03 DIAGNOSIS — I4891 Unspecified atrial fibrillation: Secondary | ICD-10-CM | POA: Diagnosis not present

## 2018-09-04 DIAGNOSIS — I639 Cerebral infarction, unspecified: Secondary | ICD-10-CM | POA: Diagnosis not present

## 2018-09-04 DIAGNOSIS — I4891 Unspecified atrial fibrillation: Secondary | ICD-10-CM | POA: Diagnosis not present

## 2018-09-05 DIAGNOSIS — I639 Cerebral infarction, unspecified: Secondary | ICD-10-CM | POA: Diagnosis not present

## 2018-09-05 DIAGNOSIS — I4891 Unspecified atrial fibrillation: Secondary | ICD-10-CM | POA: Diagnosis not present

## 2018-09-06 DIAGNOSIS — I4891 Unspecified atrial fibrillation: Secondary | ICD-10-CM | POA: Diagnosis not present

## 2018-09-06 DIAGNOSIS — I639 Cerebral infarction, unspecified: Secondary | ICD-10-CM | POA: Diagnosis not present

## 2018-09-07 DIAGNOSIS — I4891 Unspecified atrial fibrillation: Secondary | ICD-10-CM | POA: Diagnosis not present

## 2018-09-07 DIAGNOSIS — I639 Cerebral infarction, unspecified: Secondary | ICD-10-CM | POA: Diagnosis not present

## 2018-09-08 DIAGNOSIS — I639 Cerebral infarction, unspecified: Secondary | ICD-10-CM | POA: Diagnosis not present

## 2018-09-08 DIAGNOSIS — I4891 Unspecified atrial fibrillation: Secondary | ICD-10-CM | POA: Diagnosis not present

## 2018-09-09 DIAGNOSIS — I639 Cerebral infarction, unspecified: Secondary | ICD-10-CM | POA: Diagnosis not present

## 2018-09-09 DIAGNOSIS — I4891 Unspecified atrial fibrillation: Secondary | ICD-10-CM | POA: Diagnosis not present

## 2018-09-10 DIAGNOSIS — I639 Cerebral infarction, unspecified: Secondary | ICD-10-CM | POA: Diagnosis not present

## 2018-09-10 DIAGNOSIS — I4891 Unspecified atrial fibrillation: Secondary | ICD-10-CM | POA: Diagnosis not present

## 2018-09-11 DIAGNOSIS — I639 Cerebral infarction, unspecified: Secondary | ICD-10-CM | POA: Diagnosis not present

## 2018-09-11 DIAGNOSIS — I4891 Unspecified atrial fibrillation: Secondary | ICD-10-CM | POA: Diagnosis not present

## 2018-09-13 DIAGNOSIS — I639 Cerebral infarction, unspecified: Secondary | ICD-10-CM | POA: Diagnosis not present

## 2018-09-13 DIAGNOSIS — I4891 Unspecified atrial fibrillation: Secondary | ICD-10-CM | POA: Diagnosis not present

## 2018-09-14 DIAGNOSIS — I639 Cerebral infarction, unspecified: Secondary | ICD-10-CM | POA: Diagnosis not present

## 2018-09-14 DIAGNOSIS — I4891 Unspecified atrial fibrillation: Secondary | ICD-10-CM | POA: Diagnosis not present

## 2018-09-15 DIAGNOSIS — I639 Cerebral infarction, unspecified: Secondary | ICD-10-CM | POA: Diagnosis not present

## 2018-09-15 DIAGNOSIS — I4891 Unspecified atrial fibrillation: Secondary | ICD-10-CM | POA: Diagnosis not present

## 2018-09-16 DIAGNOSIS — I639 Cerebral infarction, unspecified: Secondary | ICD-10-CM | POA: Diagnosis not present

## 2018-09-16 DIAGNOSIS — I4891 Unspecified atrial fibrillation: Secondary | ICD-10-CM | POA: Diagnosis not present

## 2018-09-17 DIAGNOSIS — I4891 Unspecified atrial fibrillation: Secondary | ICD-10-CM | POA: Diagnosis not present

## 2018-09-17 DIAGNOSIS — I639 Cerebral infarction, unspecified: Secondary | ICD-10-CM | POA: Diagnosis not present

## 2018-09-18 DIAGNOSIS — I4891 Unspecified atrial fibrillation: Secondary | ICD-10-CM | POA: Diagnosis not present

## 2018-09-18 DIAGNOSIS — I639 Cerebral infarction, unspecified: Secondary | ICD-10-CM | POA: Diagnosis not present

## 2018-09-20 DIAGNOSIS — I639 Cerebral infarction, unspecified: Secondary | ICD-10-CM | POA: Diagnosis not present

## 2018-09-20 DIAGNOSIS — I4891 Unspecified atrial fibrillation: Secondary | ICD-10-CM | POA: Diagnosis not present

## 2018-09-21 DIAGNOSIS — I639 Cerebral infarction, unspecified: Secondary | ICD-10-CM | POA: Diagnosis not present

## 2018-09-21 DIAGNOSIS — I4891 Unspecified atrial fibrillation: Secondary | ICD-10-CM | POA: Diagnosis not present

## 2018-09-22 DIAGNOSIS — I639 Cerebral infarction, unspecified: Secondary | ICD-10-CM | POA: Diagnosis not present

## 2018-09-22 DIAGNOSIS — I4891 Unspecified atrial fibrillation: Secondary | ICD-10-CM | POA: Diagnosis not present

## 2018-09-23 DIAGNOSIS — I4891 Unspecified atrial fibrillation: Secondary | ICD-10-CM | POA: Diagnosis not present

## 2018-09-23 DIAGNOSIS — I639 Cerebral infarction, unspecified: Secondary | ICD-10-CM | POA: Diagnosis not present

## 2018-09-24 DIAGNOSIS — I4891 Unspecified atrial fibrillation: Secondary | ICD-10-CM | POA: Diagnosis not present

## 2018-09-24 DIAGNOSIS — I639 Cerebral infarction, unspecified: Secondary | ICD-10-CM | POA: Diagnosis not present

## 2018-09-25 DIAGNOSIS — I4891 Unspecified atrial fibrillation: Secondary | ICD-10-CM | POA: Diagnosis not present

## 2018-09-25 DIAGNOSIS — I639 Cerebral infarction, unspecified: Secondary | ICD-10-CM | POA: Diagnosis not present

## 2018-09-26 DIAGNOSIS — I4891 Unspecified atrial fibrillation: Secondary | ICD-10-CM | POA: Diagnosis not present

## 2018-09-26 DIAGNOSIS — I639 Cerebral infarction, unspecified: Secondary | ICD-10-CM | POA: Diagnosis not present

## 2018-09-27 DIAGNOSIS — I4891 Unspecified atrial fibrillation: Secondary | ICD-10-CM | POA: Diagnosis not present

## 2018-09-27 DIAGNOSIS — I639 Cerebral infarction, unspecified: Secondary | ICD-10-CM | POA: Diagnosis not present

## 2018-09-28 DIAGNOSIS — I4891 Unspecified atrial fibrillation: Secondary | ICD-10-CM | POA: Diagnosis not present

## 2018-09-28 DIAGNOSIS — I639 Cerebral infarction, unspecified: Secondary | ICD-10-CM | POA: Diagnosis not present

## 2018-09-29 DIAGNOSIS — I4891 Unspecified atrial fibrillation: Secondary | ICD-10-CM | POA: Diagnosis not present

## 2018-09-29 DIAGNOSIS — I639 Cerebral infarction, unspecified: Secondary | ICD-10-CM | POA: Diagnosis not present

## 2018-09-30 DIAGNOSIS — I4891 Unspecified atrial fibrillation: Secondary | ICD-10-CM | POA: Diagnosis not present

## 2018-09-30 DIAGNOSIS — I639 Cerebral infarction, unspecified: Secondary | ICD-10-CM | POA: Diagnosis not present

## 2018-10-01 DIAGNOSIS — I639 Cerebral infarction, unspecified: Secondary | ICD-10-CM | POA: Diagnosis not present

## 2018-10-01 DIAGNOSIS — I4891 Unspecified atrial fibrillation: Secondary | ICD-10-CM | POA: Diagnosis not present

## 2018-10-01 DIAGNOSIS — M353 Polymyalgia rheumatica: Secondary | ICD-10-CM | POA: Diagnosis not present

## 2018-10-01 DIAGNOSIS — S329XXA Fracture of unspecified parts of lumbosacral spine and pelvis, initial encounter for closed fracture: Secondary | ICD-10-CM | POA: Diagnosis not present

## 2018-10-02 ENCOUNTER — Emergency Department (HOSPITAL_COMMUNITY)
Admission: EM | Admit: 2018-10-02 | Discharge: 2018-10-02 | Disposition: A | Discharge status: Home / Self Care | Payer: Medicare Other | Attending: Emergency Medicine | Admitting: Emergency Medicine

## 2018-10-02 ENCOUNTER — Encounter (HOSPITAL_COMMUNITY): Payer: Self-pay | Admitting: Emergency Medicine

## 2018-10-02 ENCOUNTER — Other Ambulatory Visit: Payer: Self-pay

## 2018-10-02 DIAGNOSIS — I639 Cerebral infarction, unspecified: Secondary | ICD-10-CM | POA: Diagnosis not present

## 2018-10-02 DIAGNOSIS — Z7901 Long term (current) use of anticoagulants: Secondary | ICD-10-CM | POA: Insufficient documentation

## 2018-10-02 DIAGNOSIS — I4891 Unspecified atrial fibrillation: Secondary | ICD-10-CM | POA: Diagnosis not present

## 2018-10-02 DIAGNOSIS — R55 Syncope and collapse: Secondary | ICD-10-CM | POA: Diagnosis not present

## 2018-10-02 DIAGNOSIS — N183 Chronic kidney disease, stage 3 (moderate): Secondary | ICD-10-CM | POA: Diagnosis not present

## 2018-10-02 DIAGNOSIS — I447 Left bundle-branch block, unspecified: Secondary | ICD-10-CM | POA: Diagnosis not present

## 2018-10-02 DIAGNOSIS — I130 Hypertensive heart and chronic kidney disease with heart failure and stage 1 through stage 4 chronic kidney disease, or unspecified chronic kidney disease: Secondary | ICD-10-CM | POA: Diagnosis not present

## 2018-10-02 DIAGNOSIS — J449 Chronic obstructive pulmonary disease, unspecified: Secondary | ICD-10-CM | POA: Diagnosis not present

## 2018-10-02 DIAGNOSIS — I5032 Chronic diastolic (congestive) heart failure: Secondary | ICD-10-CM | POA: Insufficient documentation

## 2018-10-02 DIAGNOSIS — R001 Bradycardia, unspecified: Secondary | ICD-10-CM | POA: Diagnosis not present

## 2018-10-02 DIAGNOSIS — R279 Unspecified lack of coordination: Secondary | ICD-10-CM | POA: Diagnosis not present

## 2018-10-02 DIAGNOSIS — R5381 Other malaise: Secondary | ICD-10-CM | POA: Diagnosis not present

## 2018-10-02 DIAGNOSIS — R197 Diarrhea, unspecified: Secondary | ICD-10-CM | POA: Diagnosis not present

## 2018-10-02 DIAGNOSIS — Z7722 Contact with and (suspected) exposure to environmental tobacco smoke (acute) (chronic): Secondary | ICD-10-CM | POA: Insufficient documentation

## 2018-10-02 DIAGNOSIS — I480 Paroxysmal atrial fibrillation: Secondary | ICD-10-CM

## 2018-10-02 DIAGNOSIS — R404 Transient alteration of awareness: Secondary | ICD-10-CM | POA: Diagnosis not present

## 2018-10-02 DIAGNOSIS — I10 Essential (primary) hypertension: Secondary | ICD-10-CM

## 2018-10-02 DIAGNOSIS — T68XXXA Hypothermia, initial encounter: Secondary | ICD-10-CM | POA: Diagnosis not present

## 2018-10-02 DIAGNOSIS — F0390 Unspecified dementia without behavioral disturbance: Secondary | ICD-10-CM | POA: Insufficient documentation

## 2018-10-02 DIAGNOSIS — Z743 Need for continuous supervision: Secondary | ICD-10-CM | POA: Diagnosis not present

## 2018-10-02 LAB — URINALYSIS, ROUTINE W REFLEX MICROSCOPIC
Bacteria, UA: NONE SEEN
Bilirubin Urine: NEGATIVE
Glucose, UA: NEGATIVE mg/dL
Hgb urine dipstick: NEGATIVE
Ketones, ur: NEGATIVE mg/dL
Leukocytes,Ua: NEGATIVE
Nitrite: NEGATIVE
Protein, ur: 100 mg/dL — AB
Specific Gravity, Urine: 1.014 (ref 1.005–1.030)
pH: 6 (ref 5.0–8.0)

## 2018-10-02 LAB — COMPREHENSIVE METABOLIC PANEL
ALT: 5 U/L (ref 0–44)
AST: 11 U/L — ABNORMAL LOW (ref 15–41)
Albumin: 3.3 g/dL — ABNORMAL LOW (ref 3.5–5.0)
Alkaline Phosphatase: 59 U/L (ref 38–126)
Anion gap: 6 (ref 5–15)
BUN: 24 mg/dL — ABNORMAL HIGH (ref 8–23)
CO2: 25 mmol/L (ref 22–32)
Calcium: 8.5 mg/dL — ABNORMAL LOW (ref 8.9–10.3)
Chloride: 109 mmol/L (ref 98–111)
Creatinine, Ser: 1.47 mg/dL — ABNORMAL HIGH (ref 0.44–1.00)
GFR calc Af Amer: 37 mL/min — ABNORMAL LOW (ref >60)
GFR calc non Af Amer: 32 mL/min — ABNORMAL LOW (ref >60)
Glucose, Bld: 183 mg/dL — ABNORMAL HIGH (ref 70–99)
Potassium: 4.2 mmol/L (ref 3.5–5.1)
Sodium: 140 mmol/L (ref 135–145)
Total Bilirubin: 1.1 mg/dL (ref 0.3–1.2)
Total Protein: 5.8 g/dL — ABNORMAL LOW (ref 6.5–8.1)

## 2018-10-02 LAB — CBC WITH DIFFERENTIAL/PLATELET
Abs Immature Granulocytes: 0.01 10*3/uL (ref 0.00–0.07)
Basophils Absolute: 0 10*3/uL (ref 0.0–0.1)
Basophils Relative: 1 %
Eosinophils Absolute: 0.1 10*3/uL (ref 0.0–0.5)
Eosinophils Relative: 2 %
HCT: 34.4 % — ABNORMAL LOW (ref 36.0–46.0)
Hemoglobin: 10.9 g/dL — ABNORMAL LOW (ref 12.0–15.0)
Immature Granulocytes: 0 %
Lymphocytes Relative: 26 %
Lymphs Abs: 1.6 10*3/uL (ref 0.7–4.0)
MCH: 32.7 pg (ref 26.0–34.0)
MCHC: 31.7 g/dL (ref 30.0–36.0)
MCV: 103.3 fL — ABNORMAL HIGH (ref 80.0–100.0)
Monocytes Absolute: 0.5 10*3/uL (ref 0.1–1.0)
Monocytes Relative: 8 %
Neutro Abs: 3.9 10*3/uL (ref 1.7–7.7)
Neutrophils Relative %: 63 %
Platelets: 179 10*3/uL (ref 150–400)
RBC: 3.33 MIL/uL — ABNORMAL LOW (ref 3.87–5.11)
RDW: 12.9 % (ref 11.5–15.5)
WBC: 6.1 10*3/uL (ref 4.0–10.5)
nRBC: 0 % (ref 0.0–0.2)

## 2018-10-02 LAB — LACTIC ACID, PLASMA: Lactic Acid, Venous: 1.1 mmol/L (ref 0.5–1.9)

## 2018-10-02 MED ORDER — CARVEDILOL 3.125 MG PO TABS: 3.1250 mg | ORAL_TABLET | Freq: Two times a day (BID) | ORAL | 0 refills | Status: DC

## 2018-10-02 MED ORDER — SODIUM CHLORIDE 0.9 % IV BOLUS
500.0000 mL | Freq: Once | INTRAVENOUS | Status: AC
  Administered 2018-10-02: 500 mL via INTRAVENOUS

## 2018-10-02 NOTE — ED Triage Notes (Signed)
Pt presents by [facility] for evaluation of altered mental status with diarrhea for the last 48 hours. EMS reports pt having syncopal episode while on toilet no injury per EMS. PT received 561ml NS bolus during transport and became more responsive.

## 2018-10-02 NOTE — ED Notes (Signed)
[facility] called for pt transport home.  

## 2018-10-02 NOTE — ED Provider Notes (Signed)
[facility] DEPT Provider Note   CSN: [id number] Arrival date & time: 10/02/18  2014    History   Chief Complaint Chief Complaint  Patient presents with  . Diarrhea  . Altered Mental Status    HPI [name] is a 85 y.o. female.     HPI Patient with history of A. fib on Eliquis and dimension.  She is unable to contribute to history.  Level 5 caveat applies.  Per EMS, patient has had 2 days of diarrhea.  Had syncopal episode while on the toilet today.  Husband states patient was slumped but reported no trauma.  When EMS arrived patient was bradycardic and hypotensive.  Given IV fluids in route.  Patient is now much more alert. Past Medical History:  Diagnosis Date  . A-fib ([place])   . Anemia    anemia of renal disease  . Arthritis    mutiple joints  . Cancer [facility])    Renal neoplasm- survellience via Urology  . CHF (congestive heart failure) ([place])    Diastolic dys-- EF 02% july 2011  . Chronic kidney disease    Stage III- followed by [facility]  . COPD (chronic obstructive pulmonary disease) ([place])   . Dementia, vascular, mixed, with behavioral disturbance ([place]) 01/31/2016  . Diabetes mellitus   . DIASTOLIC DYSFUNCTION 5/85/2778   Qualifier: Diagnosis of  By: Buelah Manis MD, Lonell Grandchild    . GERD (gastroesophageal reflux disease)   . H/O hiatal hernia   . Hyperlipidemia   . Hypertension   . Left bundle branch block   . NEOPLASM, KIDNEY 03/04/2007   complex right renal cyst; Per patient- Urologist no longer following- no changes.      . Nonischemic cardiomyopathy ([place])   . Peripheral neuropathy   . Pneumonia   . Shortness of breath    Hx: of with exertion  . Venous insufficiency 02/24/2011    Patient Active Problem List   Diagnosis Date Noted  . Normocytic anemia 02/23/2017  . High risk medication use 02/23/2017  . Hearing impairment 12/26/2016  . Impaired mobility and ADLs 12/26/2016  . Urinary incontinence in female 12/26/2016  .  Cerebellar infarct ([place]) 12/26/2016  . Falls frequently 12/26/2016  . Visual impairment in both eyes   . Ovarian cyst 10/28/2016  . DNR (do not resuscitate) 08/22/2016  . Dementia, vascular, mixed, with behavioral disturbance ([place]) 01/31/2016  . Osteoporosis 09/11/2014  . History of anemia 11/10/2011  . Atrial fibrillation ([place]) 02/06/2011  . CKD stage G3b/A2, GFR 30-44 and albumin creatinine ratio 30-299 mg/g ([facility]) 01/26/2007  . Type 2 diabetes mellitus ([place]) 08/27/2006  . HLD (hyperlipidemia) 08/27/2006  . Essential hypertension 08/27/2006  . OSTEOARTHRITIS, MULTI SITES 08/27/2006    Past Surgical History:  Procedure Laterality Date  . ABDOMINAL HYSTERECTOMY    . CARDIAC CATHETERIZATION  08/12/2011   heavily calcified L main; LAD with no stenosis; 1st diagonal free of stenosis; L Cfx with no stenosis; OM1/2 without stenosis; PDA without stenosis; RCA without significant stenosis; non-ischemic cardiomyopathy (Dr. Kathy Breach)  . CATARACT EXTRACTION W/ INTRAOCULAR LENS  IMPLANT, BILATERAL    . CHOLECYSTECTOMY  1987  . DILATION AND CURETTAGE OF UTERUS    . HIP ARTHROPLASTY Right 03/08/2017   Procedure: ARTHROPLASTY BIPOLAR HIP (HEMIARTHROPLASTY);  Surgeon: Paralee Cancel, MD;  Location: [facility];  Service: Orthopedics;  Laterality: Right;  . LEFT HEART CATHETERIZATION WITH CORONARY ANGIOGRAM N/A 08/12/2011   Procedure: LEFT HEART CATHETERIZATION WITH CORONARY ANGIOGRAM;  Surgeon: Nadean Corwin.  Hilty, MD;  Location: [facility] CATH LAB;  Service: Cardiovascular;  Laterality: N/A;  . LUMBAR LAMINECTOMY/DECOMPRESSION MICRODISCECTOMY Left 05/12/2013   Procedure: Left Lumbar four-five laminotomy and microdiskectomy;  Surgeon: Hosie Spangle, MD;  Location: [facility]URO ORS;  Service: Neurosurgery;  Laterality: Left;  Left Lumbar four-five laminotomy and microdiskectomy  . NM MYOCAR PERF WALL MOTION  05/2011   lexiscan myoview - fixed septal defect r/t LBBB; normal pattern of perfusion in all regions; low risk  scan  . TONSILLECTOMY    . TRANSTHORACIC ECHOCARDIOGRAM  09/2011   EF 60-65%; MV mod-severely calcified, mildly thickened MV annulus, mild MR; LA mod dilated; RA mildly dilated     OB History   No obstetric history on file.      Home Medications    Prior to Admission medications   Medication Sig Start Date End Date Taking? Authorizing Provider  apixaban (ELIQUIS) 2.5 MG TABS tablet Take 1 tablet (2.5 mg total) by mouth 2 (two) times daily. 08/11/18 08/06/19  Ouida Sills, Chelsey L, DO  carvedilol (COREG) 3.125 MG tablet Take 1 tablet (3.125 mg total) by mouth 2 (two) times daily with a meal for 30 days. 10/02/18 11/01/18  Julianne Rice, MD  cholecalciferol (VITAMIN D3) 25 MCG (1000 UT) tablet Take 1 tablet (1,000 Units total) by mouth daily. 08/11/18   Anderson, Chelsey L, DO  QUEtiapine (SEROQUEL) 50 MG tablet Take 2 tablets (100 mg total) by mouth at bedtime. 08/11/18 08/06/19  Doristine Mango L, DO  vitamin B-12 (CYANOCOBALAMIN) 100 MCG tablet Take 1 tablet (100 mcg total) by mouth daily. 11/26/17   Mikell, Jeani Sow, MD  albuterol (PROVENTIL HFA;VENTOLIN HFA) 108 (90 Base) MCG/ACT inhaler Inhale 2 puffs into the lungs every 6 (six) hours as needed for wheezing or shortness of breath. 08/08/15 08/11/18  Rosemarie Ax, MD    Family History Family History  Problem Relation Age of Onset  . Heart disease Mother   . Arthritis Mother     Social History Social History   Tobacco Use  . Smoking status: Passive Smoke Exposure - Never Smoker  . Smokeless tobacco: Never Used  Substance Use Topics  . Alcohol use: No  . Drug use: No     Allergies   Patient has no known allergies.   Review of Systems Review of Systems  Unable to perform ROS: Dementia     Physical Exam Updated Vital Signs BP (!) 150/48   Pulse 62   Temp 98.1 F (36.7 C) (Oral)   Resp 19   Ht 5\' 7"  (1.702 m)   Wt 63.5 kg   SpO2 98%   BMI 21.93 kg/m   Physical Exam Vitals signs and nursing note reviewed.   Constitutional:      Appearance: Normal appearance. She is well-developed.  HENT:     Head: Normocephalic and atraumatic.     Comments: No obvious head trauma.  No intraoral trauma.    Mouth/Throat:     Mouth: Mucous membranes are moist.  Eyes:     Extraocular Movements: Extraocular movements intact.     Pupils: Pupils are equal, round, and reactive to light.  Neck:     Musculoskeletal: Normal range of motion and neck supple. No neck rigidity or muscular tenderness.  Cardiovascular:     Rate and Rhythm: Bradycardia present. Rhythm irregular.  Pulmonary:     Effort: Pulmonary effort is normal. No respiratory distress.     Breath sounds: Normal breath sounds. No stridor. No wheezing, rhonchi or rales.  Chest:     Chest wall: No tenderness.  Abdominal:     General: Bowel sounds are normal. There is no distension.     Palpations: Abdomen is soft.     Tenderness: There is no abdominal tenderness. There is no guarding or rebound.  Musculoskeletal: Normal range of motion.        General: No swelling, tenderness, deformity or signs of injury.     Right lower leg: No edema.     Left lower leg: No edema.  Lymphadenopathy:     Cervical: No cervical adenopathy.  Skin:    General: Skin is warm and dry.     Findings: No erythema or rash.  Neurological:     Mental Status: She is alert.     Comments: Patient mumbling with incoherent sound.  Following simple commands.  Appears to move all extremities.  Psychiatric:        Behavior: Behavior normal.      ED Treatments / Results  Labs (all labs ordered are listed, but only abnormal results are displayed) Labs Reviewed  CBC WITH DIFFERENTIAL/PLATELET - Abnormal; Notable for the following components:      Result Value   RBC 3.33 (*)    Hemoglobin 10.9 (*)    HCT 34.4 (*)    MCV 103.3 (*)    All other components within normal limits  COMPREHENSIVE METABOLIC PANEL - Abnormal; Notable for the following components:   Glucose, Bld 183  (*)    BUN 24 (*)    Creatinine, Ser 1.47 (*)    Calcium 8.5 (*)    Total Protein 5.8 (*)    Albumin 3.3 (*)    AST 11 (*)    GFR calc non Af Amer 32 (*)    GFR calc Af Amer 37 (*)    All other components within normal limits  URINALYSIS, ROUTINE W REFLEX MICROSCOPIC - Abnormal; Notable for the following components:   Protein, ur 100 (*)    All other components within normal limits  C DIFFICILE QUICK SCREEN W PCR REFLEX  LACTIC ACID, PLASMA    EKG EKG Interpretation  Date/Time:  Saturday October 02 2018 20:41:44 EDT Ventricular Rate:  54 PR Interval:    QRS Duration: 158 QT Interval:  487 QTC Calculation: 462 R Axis:   29 Text Interpretation:  Atrial fibrillation Left bundle branch block Confirmed by Julianne Rice [phone]) on 10/02/2018 8:54:33 PM   Radiology No results found.  Procedures Procedures (including critical care time)  Medications Ordered in ED Medications  sodium chloride 0.9 % bolus 500 mL (0 mLs Intravenous Stopped 10/02/18 2135)     Initial Impression / Assessment and Plan / ED Course  I have reviewed the triage vital signs and the nursing notes.  Pertinent labs & imaging results that were available during my care of the patient were reviewed by me and considered in my medical decision making (see chart for details).  Clinical Course as of Oct 01 2233  Sat Oct 02, 2018  2210 Pulse Rate(!): 110 [DY]    Clinical Course User Index [DY] Julianne Rice, MD       Suspect patient likely had a vagal episode.  Likely contributing factors are the beta-blocker she is taking and she may be dehydrated from her diarrheal illness.  Low suspicion for sepsis. Laboratory work-up is normal.  Patient is hemodynamically stable.  Will will decrease carvedilol and advise close follow-up with her primary physician.  Discussed with family medicine resident on  call and will make appointment for the patient at 3:15 on Monday for recheck. Final Clinical Impressions(s)  / ED Diagnoses   Final diagnoses:  Diarrhea, unspecified type  Vasovagal syncope  Bradycardia    ED Discharge Orders         Ordered    carvedilol (COREG) 3.125 MG tablet  2 times daily with meals     10/02/18 2234           Julianne Rice, MD 10/02/18 2235

## 2018-10-02 NOTE — ED Notes (Signed)
PTAR at bedside to transport pt home. D/c instructions and belongings given to them.  NAD

## 2018-10-02 NOTE — ED Notes (Signed)
Bed: RESA Expected date:  Expected time:  Means of arrival:  Comments: 85 yo F/ code sepsis

## 2018-10-02 NOTE — ED Notes (Signed)
Pt moved to room 16. Pt changed into new brief. No acute distress.

## 2018-10-02 NOTE — Discharge Instructions (Signed)
We are decreasing the dose of your blood pressure medication.  Try to drink plenty of water.  Go to your primary doctor's office at the family medicine clinic at 3:15 PM on Monday, 4/6.

## 2018-10-02 NOTE — ED Notes (Signed)
Fluids paused due to rising BP. Provider notified. Pt resting comfortably in NAD

## 2018-10-02 NOTE — ED Notes (Signed)
Pt husband called and given discharge instructions.

## 2018-10-02 NOTE — ED Notes (Signed)
Pt given additional blankets. NAD

## 2018-10-03 DIAGNOSIS — I639 Cerebral infarction, unspecified: Secondary | ICD-10-CM | POA: Diagnosis not present

## 2018-10-03 DIAGNOSIS — I4891 Unspecified atrial fibrillation: Secondary | ICD-10-CM | POA: Diagnosis not present

## 2018-10-04 ENCOUNTER — Ambulatory Visit (INDEPENDENT_AMBULATORY_CARE_PROVIDER_SITE_OTHER): Payer: Medicare Other | Admitting: Family Medicine

## 2018-10-04 ENCOUNTER — Other Ambulatory Visit: Payer: Self-pay

## 2018-10-04 ENCOUNTER — Telehealth (HOSPITAL_COMMUNITY): Payer: Self-pay | Admitting: *Deleted

## 2018-10-04 VITALS — BP 110/60 | HR 68 | Temp 98.6°F

## 2018-10-04 DIAGNOSIS — R55 Syncope and collapse: Secondary | ICD-10-CM | POA: Diagnosis not present

## 2018-10-04 DIAGNOSIS — I639 Cerebral infarction, unspecified: Secondary | ICD-10-CM | POA: Diagnosis not present

## 2018-10-04 DIAGNOSIS — I4891 Unspecified atrial fibrillation: Secondary | ICD-10-CM | POA: Diagnosis not present

## 2018-10-04 NOTE — Patient Instructions (Signed)
You look good today!  I am glad you have not had any more episodes of passing out since you ED visit.  I think it was likely related to some dehydration and low blood pressure.  Let's continue your blood pressure medication at the lower dose for now, that may help avoid future episodes of passing out.

## 2018-10-04 NOTE — Telephone Encounter (Signed)
Pt in ED for syncope and diarrhea.  Thought to be vasovagal.  Permanent afib.  Not appropriate for follow up in afib clinic. Pt on appropriate blood thinner and scheduled to follow up with primary re diarrhea/dehydration. No further action at this time

## 2018-10-04 NOTE — Assessment & Plan Note (Addendum)
Based on her history of 2 days of diarrhea preceding her syncopal episode and history of her being on the toilet during the episode, this is likely due to dehydration and hypotension with possibly a vasovagal element. No evidence of orthostatic hypotension at this time. She has felt well since being seen in the ED 2 days ago.  No further changes at this time. -Continue carvedilol at reduced dose, 3.125 mg daily

## 2018-10-04 NOTE — Progress Notes (Signed)
    Subjective:  [name] is a 85 y.o. female who presents to the [facility] today with a chief complaint of hospital follow up.   HPI: Hospital follow-up She was seen in hospital on 4/4 after a syncopal episode.  It was thought that syncopal episode was likely due to dehydration and hypotension.  She has had no further syncopal episodes since being seen in the ED and generally is feeling well.  She could not provide any of the medical history herself was accompanied by her spouse who is well-informed regarding her medical history.  He reported the only change made in the emergency department was that they have her carvedilol and gave her fluids.  Prior to being seen in the emergency department, she experienced 2 days of diarrhea.  She has not experience any further diarrhea in the past 2 days.  Chief Complaint noted Review of Symptoms - see HPI PMH - atrial fibrillation   Objective:  Physical Exam: BP 110/60   Pulse 68   Temp 98.6 F (37 C) (Oral)   SpO2 99% Comment: standing   BP sitting: 110/60, HR: 68 bpm BP standing: 100/64, HR 70 bpm  Gen: NAD, resting comfortably.  Was unable to provide any medical information due to dementia.  Was pleasant and interactive though not able to contribute to the conversation. HEENT: Mildly dry oral mucosa. CV: Regular rate, irregular rhythm.  3/6 systolic musical murmur. Pulm: NWOB, CTAB with no crackles, wheezes, or rhonchi GI: Normal bowel sounds present. Soft, Nontender, Nondistended. MSK: no edema, cyanosis, or clubbing noted Skin: warm, dry, moderate skin tenting   Assessment/Plan:  Vasovagal syncope Based on her history of 2 days of diarrhea preceding her syncopal episode and history of her being on the toilet during the episode, this is likely due to dehydration and hypotension with possibly a vasovagal element. No evidence of orthostatic hypotension at this time. She has felt well since being seen in the ED 2 days ago.  No further  changes at this time. -Continue carvedilol at reduced dose, 3.125 mg daily

## 2018-10-05 DIAGNOSIS — I639 Cerebral infarction, unspecified: Secondary | ICD-10-CM | POA: Diagnosis not present

## 2018-10-05 DIAGNOSIS — I4891 Unspecified atrial fibrillation: Secondary | ICD-10-CM | POA: Diagnosis not present

## 2018-10-06 DIAGNOSIS — I639 Cerebral infarction, unspecified: Secondary | ICD-10-CM | POA: Diagnosis not present

## 2018-10-06 DIAGNOSIS — I4891 Unspecified atrial fibrillation: Secondary | ICD-10-CM | POA: Diagnosis not present

## 2018-10-07 DIAGNOSIS — I639 Cerebral infarction, unspecified: Secondary | ICD-10-CM | POA: Diagnosis not present

## 2018-10-07 DIAGNOSIS — I4891 Unspecified atrial fibrillation: Secondary | ICD-10-CM | POA: Diagnosis not present

## 2018-10-08 DIAGNOSIS — I639 Cerebral infarction, unspecified: Secondary | ICD-10-CM | POA: Diagnosis not present

## 2018-10-08 DIAGNOSIS — I4891 Unspecified atrial fibrillation: Secondary | ICD-10-CM | POA: Diagnosis not present

## 2018-10-09 DIAGNOSIS — I639 Cerebral infarction, unspecified: Secondary | ICD-10-CM | POA: Diagnosis not present

## 2018-10-09 DIAGNOSIS — I4891 Unspecified atrial fibrillation: Secondary | ICD-10-CM | POA: Diagnosis not present

## 2018-10-10 DIAGNOSIS — I4891 Unspecified atrial fibrillation: Secondary | ICD-10-CM | POA: Diagnosis not present

## 2018-10-10 DIAGNOSIS — I639 Cerebral infarction, unspecified: Secondary | ICD-10-CM | POA: Diagnosis not present

## 2018-10-11 DIAGNOSIS — I639 Cerebral infarction, unspecified: Secondary | ICD-10-CM | POA: Diagnosis not present

## 2018-10-11 DIAGNOSIS — I4891 Unspecified atrial fibrillation: Secondary | ICD-10-CM | POA: Diagnosis not present

## 2018-10-12 DIAGNOSIS — I4891 Unspecified atrial fibrillation: Secondary | ICD-10-CM | POA: Diagnosis not present

## 2018-10-12 DIAGNOSIS — I639 Cerebral infarction, unspecified: Secondary | ICD-10-CM | POA: Diagnosis not present

## 2018-10-13 DIAGNOSIS — I4891 Unspecified atrial fibrillation: Secondary | ICD-10-CM | POA: Diagnosis not present

## 2018-10-13 DIAGNOSIS — I639 Cerebral infarction, unspecified: Secondary | ICD-10-CM | POA: Diagnosis not present

## 2018-10-14 DIAGNOSIS — I639 Cerebral infarction, unspecified: Secondary | ICD-10-CM | POA: Diagnosis not present

## 2018-10-14 DIAGNOSIS — I4891 Unspecified atrial fibrillation: Secondary | ICD-10-CM | POA: Diagnosis not present

## 2018-10-15 DIAGNOSIS — I639 Cerebral infarction, unspecified: Secondary | ICD-10-CM | POA: Diagnosis not present

## 2018-10-15 DIAGNOSIS — I4891 Unspecified atrial fibrillation: Secondary | ICD-10-CM | POA: Diagnosis not present

## 2018-10-16 DIAGNOSIS — I639 Cerebral infarction, unspecified: Secondary | ICD-10-CM | POA: Diagnosis not present

## 2018-10-16 DIAGNOSIS — I4891 Unspecified atrial fibrillation: Secondary | ICD-10-CM | POA: Diagnosis not present

## 2018-10-17 DIAGNOSIS — I639 Cerebral infarction, unspecified: Secondary | ICD-10-CM | POA: Diagnosis not present

## 2018-10-17 DIAGNOSIS — I4891 Unspecified atrial fibrillation: Secondary | ICD-10-CM | POA: Diagnosis not present

## 2018-10-18 DIAGNOSIS — I639 Cerebral infarction, unspecified: Secondary | ICD-10-CM | POA: Diagnosis not present

## 2018-10-18 DIAGNOSIS — I4891 Unspecified atrial fibrillation: Secondary | ICD-10-CM | POA: Diagnosis not present

## 2018-10-20 DIAGNOSIS — I639 Cerebral infarction, unspecified: Secondary | ICD-10-CM | POA: Diagnosis not present

## 2018-10-20 DIAGNOSIS — I4891 Unspecified atrial fibrillation: Secondary | ICD-10-CM | POA: Diagnosis not present

## 2018-10-21 DIAGNOSIS — I4891 Unspecified atrial fibrillation: Secondary | ICD-10-CM | POA: Diagnosis not present

## 2018-10-21 DIAGNOSIS — I639 Cerebral infarction, unspecified: Secondary | ICD-10-CM | POA: Diagnosis not present

## 2018-10-23 DIAGNOSIS — I639 Cerebral infarction, unspecified: Secondary | ICD-10-CM | POA: Diagnosis not present

## 2018-10-23 DIAGNOSIS — I4891 Unspecified atrial fibrillation: Secondary | ICD-10-CM | POA: Diagnosis not present

## 2018-10-24 DIAGNOSIS — I4891 Unspecified atrial fibrillation: Secondary | ICD-10-CM | POA: Diagnosis not present

## 2018-10-24 DIAGNOSIS — I639 Cerebral infarction, unspecified: Secondary | ICD-10-CM | POA: Diagnosis not present

## 2018-10-26 DIAGNOSIS — I4891 Unspecified atrial fibrillation: Secondary | ICD-10-CM | POA: Diagnosis not present

## 2018-10-26 DIAGNOSIS — I639 Cerebral infarction, unspecified: Secondary | ICD-10-CM | POA: Diagnosis not present

## 2018-10-27 DIAGNOSIS — I639 Cerebral infarction, unspecified: Secondary | ICD-10-CM | POA: Diagnosis not present

## 2018-10-27 DIAGNOSIS — I4891 Unspecified atrial fibrillation: Secondary | ICD-10-CM | POA: Diagnosis not present

## 2018-10-29 DIAGNOSIS — I4891 Unspecified atrial fibrillation: Secondary | ICD-10-CM | POA: Diagnosis not present

## 2018-10-29 DIAGNOSIS — I639 Cerebral infarction, unspecified: Secondary | ICD-10-CM | POA: Diagnosis not present

## 2018-10-30 DIAGNOSIS — I4891 Unspecified atrial fibrillation: Secondary | ICD-10-CM | POA: Diagnosis not present

## 2018-10-30 DIAGNOSIS — I639 Cerebral infarction, unspecified: Secondary | ICD-10-CM | POA: Diagnosis not present

## 2018-10-31 DIAGNOSIS — I4891 Unspecified atrial fibrillation: Secondary | ICD-10-CM | POA: Diagnosis not present

## 2018-10-31 DIAGNOSIS — I639 Cerebral infarction, unspecified: Secondary | ICD-10-CM | POA: Diagnosis not present

## 2018-11-01 DIAGNOSIS — I639 Cerebral infarction, unspecified: Secondary | ICD-10-CM | POA: Diagnosis not present

## 2018-11-01 DIAGNOSIS — I4891 Unspecified atrial fibrillation: Secondary | ICD-10-CM | POA: Diagnosis not present

## 2018-11-02 DIAGNOSIS — I639 Cerebral infarction, unspecified: Secondary | ICD-10-CM | POA: Diagnosis not present

## 2018-11-02 DIAGNOSIS — I4891 Unspecified atrial fibrillation: Secondary | ICD-10-CM | POA: Diagnosis not present

## 2018-11-03 ENCOUNTER — Other Ambulatory Visit: Payer: Self-pay | Admitting: *Deleted

## 2018-11-03 DIAGNOSIS — I4891 Unspecified atrial fibrillation: Secondary | ICD-10-CM | POA: Diagnosis not present

## 2018-11-03 DIAGNOSIS — I639 Cerebral infarction, unspecified: Secondary | ICD-10-CM | POA: Diagnosis not present

## 2018-11-04 DIAGNOSIS — I4891 Unspecified atrial fibrillation: Secondary | ICD-10-CM | POA: Diagnosis not present

## 2018-11-04 DIAGNOSIS — I639 Cerebral infarction, unspecified: Secondary | ICD-10-CM | POA: Diagnosis not present

## 2018-11-05 DIAGNOSIS — I639 Cerebral infarction, unspecified: Secondary | ICD-10-CM | POA: Diagnosis not present

## 2018-11-05 DIAGNOSIS — I4891 Unspecified atrial fibrillation: Secondary | ICD-10-CM | POA: Diagnosis not present

## 2018-11-06 DIAGNOSIS — I4891 Unspecified atrial fibrillation: Secondary | ICD-10-CM | POA: Diagnosis not present

## 2018-11-06 DIAGNOSIS — I639 Cerebral infarction, unspecified: Secondary | ICD-10-CM | POA: Diagnosis not present

## 2018-11-07 DIAGNOSIS — I639 Cerebral infarction, unspecified: Secondary | ICD-10-CM | POA: Diagnosis not present

## 2018-11-07 DIAGNOSIS — I4891 Unspecified atrial fibrillation: Secondary | ICD-10-CM | POA: Diagnosis not present

## 2018-11-08 DIAGNOSIS — I4891 Unspecified atrial fibrillation: Secondary | ICD-10-CM | POA: Diagnosis not present

## 2018-11-08 DIAGNOSIS — I639 Cerebral infarction, unspecified: Secondary | ICD-10-CM | POA: Diagnosis not present

## 2018-11-09 DIAGNOSIS — I639 Cerebral infarction, unspecified: Secondary | ICD-10-CM | POA: Diagnosis not present

## 2018-11-09 DIAGNOSIS — I4891 Unspecified atrial fibrillation: Secondary | ICD-10-CM | POA: Diagnosis not present

## 2018-11-10 DIAGNOSIS — I639 Cerebral infarction, unspecified: Secondary | ICD-10-CM | POA: Diagnosis not present

## 2018-11-10 DIAGNOSIS — I4891 Unspecified atrial fibrillation: Secondary | ICD-10-CM | POA: Diagnosis not present

## 2018-11-11 DIAGNOSIS — I4891 Unspecified atrial fibrillation: Secondary | ICD-10-CM | POA: Diagnosis not present

## 2018-11-11 DIAGNOSIS — I639 Cerebral infarction, unspecified: Secondary | ICD-10-CM | POA: Diagnosis not present

## 2018-11-12 ENCOUNTER — Telehealth: Payer: Self-pay | Admitting: Student in an Organized Health Care Education/Training Program

## 2018-11-12 DIAGNOSIS — I4891 Unspecified atrial fibrillation: Secondary | ICD-10-CM | POA: Diagnosis not present

## 2018-11-12 DIAGNOSIS — I639 Cerebral infarction, unspecified: Secondary | ICD-10-CM | POA: Diagnosis not present

## 2018-11-12 NOTE — Telephone Encounter (Signed)
Husband is calling because the pharmacy stated that we are not refill his wife's medication Carvedilol. He said that we are not responding back. He also stated that his wife needs this medication. It does look like it was prescribed by a different doctor last time. Can someone check and let the patient know if they need to call somewhere else or can we refill this for her. JW

## 2018-11-13 DIAGNOSIS — I639 Cerebral infarction, unspecified: Secondary | ICD-10-CM | POA: Diagnosis not present

## 2018-11-13 DIAGNOSIS — I4891 Unspecified atrial fibrillation: Secondary | ICD-10-CM | POA: Diagnosis not present

## 2018-11-14 DIAGNOSIS — I639 Cerebral infarction, unspecified: Secondary | ICD-10-CM | POA: Diagnosis not present

## 2018-11-14 DIAGNOSIS — I4891 Unspecified atrial fibrillation: Secondary | ICD-10-CM | POA: Diagnosis not present

## 2018-11-15 ENCOUNTER — Other Ambulatory Visit: Payer: Self-pay | Admitting: Student in an Organized Health Care Education/Training Program

## 2018-11-15 DIAGNOSIS — I10 Essential (primary) hypertension: Secondary | ICD-10-CM

## 2018-11-15 DIAGNOSIS — I480 Paroxysmal atrial fibrillation: Secondary | ICD-10-CM

## 2018-11-15 MED ORDER — CARVEDILOL 3.125 MG PO TABS: 3.1250 mg | ORAL_TABLET | Freq: Two times a day (BID) | ORAL | 1 refills | Status: DC

## 2018-11-15 NOTE — Progress Notes (Signed)
Refilled carvedilol 3.125mg  BID today.  I want to see patient for appointment to discuss medications with husband. Consider discontinuing seroquel.

## 2018-11-16 DIAGNOSIS — I4891 Unspecified atrial fibrillation: Secondary | ICD-10-CM | POA: Diagnosis not present

## 2018-11-16 DIAGNOSIS — I639 Cerebral infarction, unspecified: Secondary | ICD-10-CM | POA: Diagnosis not present

## 2018-11-17 DIAGNOSIS — I4891 Unspecified atrial fibrillation: Secondary | ICD-10-CM | POA: Diagnosis not present

## 2018-11-17 DIAGNOSIS — I639 Cerebral infarction, unspecified: Secondary | ICD-10-CM | POA: Diagnosis not present

## 2018-11-18 DIAGNOSIS — I4891 Unspecified atrial fibrillation: Secondary | ICD-10-CM | POA: Diagnosis not present

## 2018-11-18 DIAGNOSIS — I639 Cerebral infarction, unspecified: Secondary | ICD-10-CM | POA: Diagnosis not present

## 2018-11-19 DIAGNOSIS — I4891 Unspecified atrial fibrillation: Secondary | ICD-10-CM | POA: Diagnosis not present

## 2018-11-19 DIAGNOSIS — I639 Cerebral infarction, unspecified: Secondary | ICD-10-CM | POA: Diagnosis not present

## 2018-11-20 DIAGNOSIS — I4891 Unspecified atrial fibrillation: Secondary | ICD-10-CM | POA: Diagnosis not present

## 2018-11-20 DIAGNOSIS — I639 Cerebral infarction, unspecified: Secondary | ICD-10-CM | POA: Diagnosis not present

## 2018-11-21 DIAGNOSIS — I4891 Unspecified atrial fibrillation: Secondary | ICD-10-CM | POA: Diagnosis not present

## 2018-11-21 DIAGNOSIS — I639 Cerebral infarction, unspecified: Secondary | ICD-10-CM | POA: Diagnosis not present

## 2018-11-24 DIAGNOSIS — I639 Cerebral infarction, unspecified: Secondary | ICD-10-CM | POA: Diagnosis not present

## 2018-11-24 DIAGNOSIS — I4891 Unspecified atrial fibrillation: Secondary | ICD-10-CM | POA: Diagnosis not present

## 2018-11-25 DIAGNOSIS — I639 Cerebral infarction, unspecified: Secondary | ICD-10-CM | POA: Diagnosis not present

## 2018-11-25 DIAGNOSIS — I4891 Unspecified atrial fibrillation: Secondary | ICD-10-CM | POA: Diagnosis not present

## 2018-11-26 DIAGNOSIS — I639 Cerebral infarction, unspecified: Secondary | ICD-10-CM | POA: Diagnosis not present

## 2018-11-26 DIAGNOSIS — I4891 Unspecified atrial fibrillation: Secondary | ICD-10-CM | POA: Diagnosis not present

## 2018-11-27 DIAGNOSIS — I639 Cerebral infarction, unspecified: Secondary | ICD-10-CM | POA: Diagnosis not present

## 2018-11-27 DIAGNOSIS — I4891 Unspecified atrial fibrillation: Secondary | ICD-10-CM | POA: Diagnosis not present

## 2018-11-28 DIAGNOSIS — I639 Cerebral infarction, unspecified: Secondary | ICD-10-CM | POA: Diagnosis not present

## 2018-11-28 DIAGNOSIS — I4891 Unspecified atrial fibrillation: Secondary | ICD-10-CM | POA: Diagnosis not present

## 2018-11-29 DIAGNOSIS — I639 Cerebral infarction, unspecified: Secondary | ICD-10-CM | POA: Diagnosis not present

## 2018-11-29 DIAGNOSIS — I4891 Unspecified atrial fibrillation: Secondary | ICD-10-CM | POA: Diagnosis not present

## 2018-11-30 DIAGNOSIS — I4891 Unspecified atrial fibrillation: Secondary | ICD-10-CM | POA: Diagnosis not present

## 2018-11-30 DIAGNOSIS — I639 Cerebral infarction, unspecified: Secondary | ICD-10-CM | POA: Diagnosis not present

## 2018-12-01 DIAGNOSIS — I4891 Unspecified atrial fibrillation: Secondary | ICD-10-CM | POA: Diagnosis not present

## 2018-12-01 DIAGNOSIS — I639 Cerebral infarction, unspecified: Secondary | ICD-10-CM | POA: Diagnosis not present

## 2018-12-02 DIAGNOSIS — I639 Cerebral infarction, unspecified: Secondary | ICD-10-CM | POA: Diagnosis not present

## 2018-12-02 DIAGNOSIS — I4891 Unspecified atrial fibrillation: Secondary | ICD-10-CM | POA: Diagnosis not present

## 2018-12-03 DIAGNOSIS — I4891 Unspecified atrial fibrillation: Secondary | ICD-10-CM | POA: Diagnosis not present

## 2018-12-03 DIAGNOSIS — I639 Cerebral infarction, unspecified: Secondary | ICD-10-CM | POA: Diagnosis not present

## 2018-12-04 DIAGNOSIS — I639 Cerebral infarction, unspecified: Secondary | ICD-10-CM | POA: Diagnosis not present

## 2018-12-04 DIAGNOSIS — I4891 Unspecified atrial fibrillation: Secondary | ICD-10-CM | POA: Diagnosis not present

## 2018-12-05 DIAGNOSIS — I4891 Unspecified atrial fibrillation: Secondary | ICD-10-CM | POA: Diagnosis not present

## 2018-12-05 DIAGNOSIS — I639 Cerebral infarction, unspecified: Secondary | ICD-10-CM | POA: Diagnosis not present

## 2018-12-06 DIAGNOSIS — I639 Cerebral infarction, unspecified: Secondary | ICD-10-CM | POA: Diagnosis not present

## 2018-12-06 DIAGNOSIS — I4891 Unspecified atrial fibrillation: Secondary | ICD-10-CM | POA: Diagnosis not present

## 2018-12-06 DIAGNOSIS — S329XXA Fracture of unspecified parts of lumbosacral spine and pelvis, initial encounter for closed fracture: Secondary | ICD-10-CM | POA: Diagnosis not present

## 2018-12-07 DIAGNOSIS — I639 Cerebral infarction, unspecified: Secondary | ICD-10-CM | POA: Diagnosis not present

## 2018-12-07 DIAGNOSIS — I4891 Unspecified atrial fibrillation: Secondary | ICD-10-CM | POA: Diagnosis not present

## 2018-12-09 DIAGNOSIS — I639 Cerebral infarction, unspecified: Secondary | ICD-10-CM | POA: Diagnosis not present

## 2018-12-09 DIAGNOSIS — I4891 Unspecified atrial fibrillation: Secondary | ICD-10-CM | POA: Diagnosis not present

## 2018-12-10 DIAGNOSIS — I4891 Unspecified atrial fibrillation: Secondary | ICD-10-CM | POA: Diagnosis not present

## 2018-12-10 DIAGNOSIS — I639 Cerebral infarction, unspecified: Secondary | ICD-10-CM | POA: Diagnosis not present

## 2018-12-11 DIAGNOSIS — I639 Cerebral infarction, unspecified: Secondary | ICD-10-CM | POA: Diagnosis not present

## 2018-12-11 DIAGNOSIS — I4891 Unspecified atrial fibrillation: Secondary | ICD-10-CM | POA: Diagnosis not present

## 2018-12-12 DIAGNOSIS — I639 Cerebral infarction, unspecified: Secondary | ICD-10-CM | POA: Diagnosis not present

## 2018-12-12 DIAGNOSIS — I4891 Unspecified atrial fibrillation: Secondary | ICD-10-CM | POA: Diagnosis not present

## 2018-12-16 ENCOUNTER — Other Ambulatory Visit: Payer: Self-pay | Admitting: *Deleted

## 2018-12-16 DIAGNOSIS — I480 Paroxysmal atrial fibrillation: Secondary | ICD-10-CM

## 2018-12-16 DIAGNOSIS — I10 Essential (primary) hypertension: Secondary | ICD-10-CM

## 2018-12-17 MED ORDER — CARVEDILOL 3.125 MG PO TABS: 3.1250 mg | ORAL_TABLET | Freq: Two times a day (BID) | ORAL | 1 refills | Status: DC

## 2018-12-27 DIAGNOSIS — I639 Cerebral infarction, unspecified: Secondary | ICD-10-CM | POA: Diagnosis not present

## 2018-12-27 DIAGNOSIS — I4891 Unspecified atrial fibrillation: Secondary | ICD-10-CM | POA: Diagnosis not present

## 2018-12-28 ENCOUNTER — Other Ambulatory Visit: Payer: Self-pay

## 2018-12-28 ENCOUNTER — Ambulatory Visit (INDEPENDENT_AMBULATORY_CARE_PROVIDER_SITE_OTHER): Payer: Medicare Other

## 2018-12-28 VITALS — BP 110/65 | HR 77 | Temp 98.2°F | Ht 67.0 in | Wt 138.0 lb

## 2018-12-28 DIAGNOSIS — Z0000 Encounter for general adult medical examination without abnormal findings: Secondary | ICD-10-CM | POA: Diagnosis not present

## 2018-12-28 DIAGNOSIS — I639 Cerebral infarction, unspecified: Secondary | ICD-10-CM | POA: Diagnosis not present

## 2018-12-28 DIAGNOSIS — I4891 Unspecified atrial fibrillation: Secondary | ICD-10-CM | POA: Diagnosis not present

## 2018-12-28 NOTE — Patient Instructions (Addendum)
You spoke to [name], [place] for your annual wellness visit.  We will see you in early fall for you annual flu vaccine!   We also discussed recommended health maintenance. Please call our office and schedule a visit. As discussed, you are due for: Health Maintenance  Topic Date Due  . FOOT EXAM  08/14/2018  . HEMOGLOBIN A1C  08/28/2018  . OPHTHALMOLOGY EXAM  12/19/2018  . INFLUENZA VACCINE  01/29/2019  . TETANUS/TDAP  12/09/2020  . DEXA SCAN  Completed  . PNA vac Low Risk Adult  Completed    Our clinic's number is [phone]. Please call with questions or concerns about what we discussed today.   Fall Prevention in the Home, Adult Falls can cause injuries. They can happen to people of all ages. There are many things you can do to make your home safe and to help prevent falls. Ask for help when making these changes, if needed. What actions can I take to prevent falls? General Instructions  Use good lighting in all rooms. Replace any light bulbs that burn out.  Turn on the lights when you go into a dark area. Use night-lights.  Keep items that you use often in easy-to-reach places. Lower the shelves around your home if necessary.  Set up your furniture so you have a clear path. Avoid moving your furniture around.  Do not have throw rugs and other things on the floor that can make you trip.  Avoid walking on wet floors.  If any of your floors are uneven, fix them.  Add color or contrast paint or tape to clearly mark and help you see: ? Any grab bars or handrails. ? First and last steps of stairways. ? Where the edge of each step is.  If you use a stepladder: ? Make sure that it is fully opened. Do not climb a closed stepladder. ? Make sure that both sides of the stepladder are locked into place. ? Ask someone to hold the stepladder for you while you use it.  If there are any pets around you, be aware of where they are. What can I do in the bathroom?      Keep  the floor dry. Clean up any water that spills onto the floor as soon as it happens.  Remove soap buildup in the tub or shower regularly.  Use non-skid mats or decals on the floor of the tub or shower.  Attach bath mats securely with double-sided, non-slip rug tape.  If you need to sit down in the shower, use a plastic, non-slip stool.  Install grab bars by the toilet and in the tub and shower. Do not use towel bars as grab bars. What can I do in the bedroom?  Make sure that you have a light by your bed that is easy to reach.  Do not use any sheets or blankets that are too big for your bed. They should not hang down onto the floor.  Have a firm chair that has side arms. You can use this for support while you get dressed. What can I do in the kitchen?  Clean up any spills right away.  If you need to reach something above you, use a strong step stool that has a grab bar.  Keep electrical cords out of the way.  Do not use floor polish or wax that makes floors slippery. If you must use wax, use non-skid floor wax. What can I do with my stairs?  Do  not leave any items on the stairs.  Make sure that you have a light switch at the top of the stairs and the bottom of the stairs. If you do not have them, ask someone to add them for you.  Make sure that there are handrails on both sides of the stairs, and use them. Fix handrails that are broken or loose. Make sure that handrails are as long as the stairways.  Install non-slip stair treads on all stairs in your home.  Avoid having throw rugs at the top or bottom of the stairs. If you do have throw rugs, attach them to the floor with carpet tape.  Choose a carpet that does not hide the edge of the steps on the stairway.  Check any carpeting to make sure that it is firmly attached to the stairs. Fix any carpet that is loose or worn. What can I do on the outside of my home?  Use bright outdoor lighting.  Regularly fix the edges of  walkways and driveways and fix any cracks.  Remove anything that might make you trip as you walk through a door, such as a raised step or threshold.  Trim any bushes or trees on the path to your home.  Regularly check to see if handrails are loose or broken. Make sure that both sides of any steps have handrails.  Install guardrails along the edges of any raised decks and porches.  Clear walking paths of anything that might make someone trip, such as tools or rocks.  Have any leaves, snow, or ice cleared regularly.  Use sand or salt on walking paths during winter.  Clean up any spills in your garage right away. This includes grease or oil spills. What other actions can I take?  Wear shoes that: ? Have a low heel. Do not wear high heels. ? Have rubber bottoms. ? Are comfortable and fit you well. ? Are closed at the toe. Do not wear open-toe sandals.  Use tools that help you move around (mobility aids) if they are needed. These include: ? Canes. ? Walkers. ? Scooters. ? Crutches.  Review your medicines with your doctor. Some medicines can make you feel dizzy. This can increase your chance of falling. Ask your doctor what other things you can do to help prevent falls. Where to find more information  Centers for Disease Control and Prevention, STEADI: https://garcia.biz/  [employer] on Aging: BrainJudge.co.uk Contact a doctor if:  You are afraid of falling at home.  You feel weak, drowsy, or dizzy at home.  You fall at home. Summary  There are many simple things that you can do to make your home safe and to help prevent falls.  Ways to make your home safe include removing tripping hazards and installing grab bars in the bathroom.  Ask for help when making these changes in your home. This information is not intended to replace advice given to you by your health care provider. Make sure you discuss any questions you have with your health care  provider. Document Released: 04/12/2009 Document Revised: 10/07/2018 Document Reviewed: 01/29/2017 Elsevier Patient Education  2020 [employer].

## 2018-12-28 NOTE — Progress Notes (Addendum)
Subjective:   [name] is a 85 y.o. female who presents for Medicare Annual initial preventive examination.  The patient consented to a virtual visit.  Review of Systems: Defer to PCP  Cardiac Risk Factors include: advanced age (>10men, >77 women)     Objective:     Vitals: BP 110/65   Pulse 77   Temp 98.2 F (36.8 C) (Oral)   Ht 5\' 7"  (1.702 m)   Wt 138 lb (62.6 kg)   SpO2 99%   BMI 21.61 kg/m   Body mass index is 21.61 kg/m.  Advanced Directives 12/28/2018 10/02/2018 04/15/2018 02/26/2018 10/02/2017 08/14/2017 08/08/2017  Does Patient Have a Medical Advance Directive? Yes Unable to assess, patient is non-responsive or altered mental status Yes Yes Yes No No  Type of [employer];Living will [place];Living will Living will - -  Does patient want to make changes to medical advance directive? No - Patient declined - No - Patient declined No - Patient declined No - Patient declined - -  Copy of [place] in Chart? Yes - validated most recent copy scanned in chart (See row information) - Yes Yes - - -  Would patient like information on creating a medical advance directive? - - - - No - Patient declined No - Patient declined No - Patient declined  Pre-existing out of facility DNR order (yellow form or pink MOST form) - - - - - - -    Tobacco Social History   Tobacco Use  Smoking Status Passive Smoke Exposure - Never Smoker  Smokeless Tobacco Never Used     Clinical Intake:  Pre-visit preparation completed: Yes  Pain Score: 0-No pain  How often do you need to have someone help you when you read instructions, pamphlets, or other written materials from your doctor or pharmacy?: 5 - Always What is the last grade level you completed in school?: High School- dementia now  Interpreter Needed?: No   Past Medical History:  Diagnosis Date  . A-fib ([place])   . Anemia    anemia of renal disease  . Arthritis    mutiple joints  . Cancer [facility])    Renal neoplasm- survellience via Urology  . CHF (congestive heart failure) ([place])    Diastolic dys-- EF 32% july 2011  . Chronic kidney disease    Stage III- followed by [facility]  . COPD (chronic obstructive pulmonary disease) ([place])   . Dementia, vascular, mixed, with behavioral disturbance ([place]) 01/31/2016  . Diabetes mellitus   . DIASTOLIC DYSFUNCTION 9/92/4268   Qualifier: Diagnosis of  By: Buelah Manis MD, Lonell Grandchild    . GERD (gastroesophageal reflux disease)   . H/O hiatal hernia   . Hyperlipidemia   . Hypertension   . Left bundle branch block   . NEOPLASM, KIDNEY 03/04/2007   complex right renal cyst; Per patient- Urologist no longer following- no changes.      . Nonischemic cardiomyopathy ([place])   . Peripheral neuropathy   . Pneumonia   . Shortness of breath    Hx: of with exertion  . Venous insufficiency 02/24/2011   Past Surgical History:  Procedure Laterality Date  . ABDOMINAL HYSTERECTOMY    . CARDIAC CATHETERIZATION  08/12/2011   heavily calcified L main; LAD with no stenosis; 1st diagonal free of stenosis; L Cfx with no stenosis; OM1/2 without stenosis; PDA without stenosis; RCA without significant stenosis; non-ischemic cardiomyopathy (Dr. Raquel [name]  [place])  . CATARACT EXTRACTION W/ INTRAOCULAR LENS  IMPLANT, BILATERAL    . CHOLECYSTECTOMY  1987  . DILATION AND CURETTAGE OF UTERUS    . HIP ARTHROPLASTY Right 03/08/2017   Procedure: ARTHROPLASTY BIPOLAR HIP (HEMIARTHROPLASTY);  Surgeon: Paralee Cancel, MD;  Location: [facility] ORS;  Service: Orthopedics;  Laterality: Right;  . LEFT HEART CATHETERIZATION WITH CORONARY ANGIOGRAM N/A 08/12/2011   Procedure: LEFT HEART CATHETERIZATION WITH CORONARY ANGIOGRAM;  Surgeon: Pixie Casino, MD;  Location: [facility] CATH LAB;  Service: Cardiovascular;  Laterality: N/A;  . LUMBAR LAMINECTOMY/DECOMPRESSION MICRODISCECTOMY Left 05/12/2013   Procedure: Left Lumbar four-five  laminotomy and microdiskectomy;  Surgeon: Hosie Spangle, MD;  Location: [facility] NEURO ORS;  Service: Neurosurgery;  Laterality: Left;  Left Lumbar four-five laminotomy and microdiskectomy  . NM MYOCAR PERF WALL MOTION  05/2011   lexiscan myoview - fixed septal defect r/t LBBB; normal pattern of perfusion in all regions; low risk scan  . TONSILLECTOMY    . TRANSTHORACIC ECHOCARDIOGRAM  09/2011   EF 60-65%; MV mod-severely calcified, mildly thickened MV annulus, mild MR; LA mod dilated; RA mildly dilated   Family History  Problem Relation Age of Onset  . Heart disease Mother   . Arthritis Mother    Social History   Socioeconomic History  . Marital status: Married    Spouse name: [name]   . Number of children: 3  . Years of education: 82  . Highest education level: Not on file  Occupational History    Employer: RETIRED    Comment: CNA  Social Needs  . Financial resource strain: Not hard at all  . Food insecurity    Worry: Never true    Inability: Never true  . Transportation needs    Medical: No    Non-medical: No  Tobacco Use  . Smoking status: Passive Smoke Exposure - Never Smoker  . Smokeless tobacco: Never Used  Substance and Sexual Activity  . Alcohol use: No  . Drug use: No  . Sexual activity: Not Currently  Lifestyle  . Physical activity    Days per week: 0 days    Minutes per session: 0 min  . Stress: Not at all  Relationships  . Social Herbalist on phone: Three times a week    Gets together: Three times a week    Attends religious service: Never    Active member of club or organization: No    Attends meetings of clubs or organizations: Never    Relationship status: Married  Other Topics Concern  . Not on file  Social History Narrative   01/31/16 Dr. Emmaline Life       Patient has faith in God and plays a large part in how she deals with problems. However, does not attend church regularly.    Lives with husband, [name], who does all driving and  her pill box.       Patient has 3 children    1. [name]    2. [name]   3. [name] (deceased)     Outpatient Encounter Medications as of 12/28/2018  Medication Sig  . apixaban (ELIQUIS) 2.5 MG TABS tablet Take 1 tablet (2.5 mg total) by mouth 2 (two) times daily.  . carvedilol (COREG) 3.125 MG tablet Take 1 tablet (3.125 mg total) by mouth 2 (two) times daily with a meal.  . cholecalciferol (VITAMIN D3) 25 MCG (1000 UT) tablet Take 1 tablet (1,000 Units total) by mouth daily.  Marland Kitchen  QUEtiapine (SEROQUEL) 50 MG tablet Take 2 tablets (100 mg total) by mouth at bedtime.  . vitamin B-12 (CYANOCOBALAMIN) 100 MCG tablet Take 1 tablet (100 mcg total) by mouth daily.  . [DISCONTINUED] albuterol (PROVENTIL HFA;VENTOLIN HFA) 108 (90 Base) MCG/ACT inhaler Inhale 2 puffs into the lungs every 6 (six) hours as needed for wheezing or shortness of breath.   No facility-administered encounter medications on file as of 12/28/2018.     Activities of Daily Living In your present state of health, do you have any difficulty performing the following activities: 12/28/2018  Hearing? N  Vision? N  Difficulty concentrating or making decisions? Y  Walking or climbing stairs? Y  Dressing or bathing? Y  Doing errands, shopping? Y  Preparing Food and eating ? Y  Using the Toilet? Y  In the past six months, have you accidently leaked urine? Y  Do you have problems with loss of bowel control? Y  Managing your Medications? Y  Managing your Finances? Y  Some recent data might be hidden    Patient Care Team: Richarda Osmond, DO as PCP - General (Family Medicine)    Assessment:   This is a routine wellness examination for Stefan.  Exercise Activities and Dietary recommendations Current Exercise Habits: The patient does not participate in regular exercise at present, Exercise limited by: psychological condition(s)  Goals   None     Fall Risk Fall Risk  12/28/2018 12/28/2018 08/11/2018  04/15/2018 02/26/2018  Falls in the past year? 1 0 0 No No  Comment - - - - -  Number falls in past yr: 1 - - - -  Injury with Fall? 0 - - - -  Comment - - - - -  Risk Factor Category  - - - - -  Risk for fall due to : History of fall(s);Mental status change - - - -  Follow up Falls prevention discussed;Education provided - - - -   Is the patient's home free of loose throw rugs in walkways, pet beds, electrical cords, etc?   yes      Grab bars in the bathroom? yes      Handrails on the stairs?   yes      Adequate lighting?   yes  Depression Screen PHQ 2/9 Scores 12/28/2018 12/28/2018 08/11/2018 04/15/2018  PHQ - 2 Score 0 0 0 0  Exception Documentation - - - -  Not completed - - - -     Cognitive Function MMSE - Mini Mental State Exam 10/12/2011  Orientation to time 5  Orientation to Place 5  Registration 3  Attention/ Calculation 5  Recall 2  Language- name 2 objects 2  Language- repeat 1  Language- follow 3 step command 3  Language- read & follow direction 1  Write a sentence 1  Copy design 1  Total score 29   Montreal Cognitive Assessment  12/25/2016  Visuospatial/ Executive (0/5) 1  Naming (0/3) 1  Attention: Read list of digits (0/2) 1  Attention: Read list of letters (0/1) 0  Attention: Serial 7 subtraction starting at 100 (0/3) 0  Language: Repeat phrase (0/2) 0  Language : Fluency (0/1) 0  Abstraction (0/2) 0  Delayed Recall (0/5) 0  Orientation (0/6) 1  Total 4  Adjusted Score (based on education) 5   6CIT Screen 12/28/2018  What Year? 4 points  What month? 3 points  What time? 3 points  Count back from 20 4 points  Months in reverse 4  points  Repeat phrase 10 points  Total Score 28    Immunization History  Administered Date(s) Administered  . Influenza Split 03/19/2011, 03/11/2012  . Influenza Whole 03/16/2008, 04/01/2010  . Influenza, High Dose Seasonal PF 03/10/2015, 02/24/2017  . Influenza,inj,Quad PF,6+ Mos 03/21/2014  . Influenza-Unspecified  03/23/2013, 02/20/2016  . Pneumococcal Conjugate-13 08/14/2017  . Pneumococcal Polysaccharide-23 06/30/1997  . Td 06/30/1998  . Tdap 12/10/2010  . Zoster 11/30/2007     Screening Tests Health Maintenance  Topic Date Due  . FOOT EXAM  08/14/2018  . HEMOGLOBIN A1C  08/28/2018  . OPHTHALMOLOGY EXAM  12/19/2018  . INFLUENZA VACCINE  01/29/2019  . TETANUS/TDAP  12/09/2020  . DEXA SCAN  Completed  . PNA vac Low Risk Adult  Completed    Cancer Screenings: Lung: Low Dose CT Chest recommended if Age 80-80 years, 30 pack-year currently smoking OR have quit w/in 15years. Patient does not qualify. Breast:  Up to date on Mammogram? Yes   Up to date of Bone Density/Dexa? Yes Colorectal: Has never had one  Plan:  Plan to come back into the office early fall for your annual flu vaccine!   I have personally reviewed and noted the following in the patient's chart:   . Medical and social history . Use of alcohol, tobacco or illicit drugs  . Current medications and supplements . Functional ability and status . Nutritional status . Physical activity . Advanced directives . List of other physicians . Hospitalizations, surgeries, and ER visits in previous 12 months . Vitals . Screenings to include cognitive, depression, and falls . Referrals and appointments  In addition, I have reviewed and discussed with patient certain preventive protocols, quality metrics, and best practice recommendations. A written personalized care plan for preventive services as well as general preventive health recommendations were provided to patient.   Dorna Bloom, [place]  12/28/2018    I have reviewed this visit and agree with the documentation.   Doristine Mango, DO PGY-2 Family Medicine Resident

## 2018-12-29 DIAGNOSIS — I4891 Unspecified atrial fibrillation: Secondary | ICD-10-CM | POA: Diagnosis not present

## 2018-12-29 DIAGNOSIS — I639 Cerebral infarction, unspecified: Secondary | ICD-10-CM | POA: Diagnosis not present

## 2018-12-30 DIAGNOSIS — I4891 Unspecified atrial fibrillation: Secondary | ICD-10-CM | POA: Diagnosis not present

## 2018-12-30 DIAGNOSIS — I639 Cerebral infarction, unspecified: Secondary | ICD-10-CM | POA: Diagnosis not present

## 2018-12-31 DIAGNOSIS — I639 Cerebral infarction, unspecified: Secondary | ICD-10-CM | POA: Diagnosis not present

## 2018-12-31 DIAGNOSIS — I4891 Unspecified atrial fibrillation: Secondary | ICD-10-CM | POA: Diagnosis not present

## 2019-01-01 DIAGNOSIS — I4891 Unspecified atrial fibrillation: Secondary | ICD-10-CM | POA: Diagnosis not present

## 2019-01-01 DIAGNOSIS — I639 Cerebral infarction, unspecified: Secondary | ICD-10-CM | POA: Diagnosis not present

## 2019-01-02 DIAGNOSIS — I4891 Unspecified atrial fibrillation: Secondary | ICD-10-CM | POA: Diagnosis not present

## 2019-01-02 DIAGNOSIS — I639 Cerebral infarction, unspecified: Secondary | ICD-10-CM | POA: Diagnosis not present

## 2019-01-03 DIAGNOSIS — I639 Cerebral infarction, unspecified: Secondary | ICD-10-CM | POA: Diagnosis not present

## 2019-01-03 DIAGNOSIS — I4891 Unspecified atrial fibrillation: Secondary | ICD-10-CM | POA: Diagnosis not present

## 2019-01-04 DIAGNOSIS — I4891 Unspecified atrial fibrillation: Secondary | ICD-10-CM | POA: Diagnosis not present

## 2019-01-04 DIAGNOSIS — I639 Cerebral infarction, unspecified: Secondary | ICD-10-CM | POA: Diagnosis not present

## 2019-01-05 DIAGNOSIS — I4891 Unspecified atrial fibrillation: Secondary | ICD-10-CM | POA: Diagnosis not present

## 2019-01-05 DIAGNOSIS — I639 Cerebral infarction, unspecified: Secondary | ICD-10-CM | POA: Diagnosis not present

## 2019-01-06 DIAGNOSIS — I639 Cerebral infarction, unspecified: Secondary | ICD-10-CM | POA: Diagnosis not present

## 2019-01-06 DIAGNOSIS — I4891 Unspecified atrial fibrillation: Secondary | ICD-10-CM | POA: Diagnosis not present

## 2019-01-07 DIAGNOSIS — I4891 Unspecified atrial fibrillation: Secondary | ICD-10-CM | POA: Diagnosis not present

## 2019-01-07 DIAGNOSIS — I639 Cerebral infarction, unspecified: Secondary | ICD-10-CM | POA: Diagnosis not present

## 2019-01-08 DIAGNOSIS — I639 Cerebral infarction, unspecified: Secondary | ICD-10-CM | POA: Diagnosis not present

## 2019-01-08 DIAGNOSIS — I4891 Unspecified atrial fibrillation: Secondary | ICD-10-CM | POA: Diagnosis not present

## 2019-01-09 DIAGNOSIS — I4891 Unspecified atrial fibrillation: Secondary | ICD-10-CM | POA: Diagnosis not present

## 2019-01-09 DIAGNOSIS — I639 Cerebral infarction, unspecified: Secondary | ICD-10-CM | POA: Diagnosis not present

## 2019-01-10 DIAGNOSIS — I639 Cerebral infarction, unspecified: Secondary | ICD-10-CM | POA: Diagnosis not present

## 2019-01-10 DIAGNOSIS — I4891 Unspecified atrial fibrillation: Secondary | ICD-10-CM | POA: Diagnosis not present

## 2019-01-11 DIAGNOSIS — I4891 Unspecified atrial fibrillation: Secondary | ICD-10-CM | POA: Diagnosis not present

## 2019-01-11 DIAGNOSIS — I639 Cerebral infarction, unspecified: Secondary | ICD-10-CM | POA: Diagnosis not present

## 2019-01-12 DIAGNOSIS — I639 Cerebral infarction, unspecified: Secondary | ICD-10-CM | POA: Diagnosis not present

## 2019-01-12 DIAGNOSIS — I4891 Unspecified atrial fibrillation: Secondary | ICD-10-CM | POA: Diagnosis not present

## 2019-01-13 DIAGNOSIS — I4891 Unspecified atrial fibrillation: Secondary | ICD-10-CM | POA: Diagnosis not present

## 2019-01-13 DIAGNOSIS — I639 Cerebral infarction, unspecified: Secondary | ICD-10-CM | POA: Diagnosis not present

## 2019-01-14 DIAGNOSIS — I639 Cerebral infarction, unspecified: Secondary | ICD-10-CM | POA: Diagnosis not present

## 2019-01-14 DIAGNOSIS — I4891 Unspecified atrial fibrillation: Secondary | ICD-10-CM | POA: Diagnosis not present

## 2019-01-15 DIAGNOSIS — I639 Cerebral infarction, unspecified: Secondary | ICD-10-CM | POA: Diagnosis not present

## 2019-01-15 DIAGNOSIS — I4891 Unspecified atrial fibrillation: Secondary | ICD-10-CM | POA: Diagnosis not present

## 2019-01-16 DIAGNOSIS — I639 Cerebral infarction, unspecified: Secondary | ICD-10-CM | POA: Diagnosis not present

## 2019-01-16 DIAGNOSIS — I4891 Unspecified atrial fibrillation: Secondary | ICD-10-CM | POA: Diagnosis not present

## 2019-01-17 DIAGNOSIS — I639 Cerebral infarction, unspecified: Secondary | ICD-10-CM | POA: Diagnosis not present

## 2019-01-17 DIAGNOSIS — I4891 Unspecified atrial fibrillation: Secondary | ICD-10-CM | POA: Diagnosis not present

## 2019-01-18 DIAGNOSIS — I4891 Unspecified atrial fibrillation: Secondary | ICD-10-CM | POA: Diagnosis not present

## 2019-01-18 DIAGNOSIS — I639 Cerebral infarction, unspecified: Secondary | ICD-10-CM | POA: Diagnosis not present

## 2019-01-19 DIAGNOSIS — I4891 Unspecified atrial fibrillation: Secondary | ICD-10-CM | POA: Diagnosis not present

## 2019-01-19 DIAGNOSIS — I639 Cerebral infarction, unspecified: Secondary | ICD-10-CM | POA: Diagnosis not present

## 2019-01-20 DIAGNOSIS — I4891 Unspecified atrial fibrillation: Secondary | ICD-10-CM | POA: Diagnosis not present

## 2019-01-20 DIAGNOSIS — I639 Cerebral infarction, unspecified: Secondary | ICD-10-CM | POA: Diagnosis not present

## 2019-01-21 DIAGNOSIS — I639 Cerebral infarction, unspecified: Secondary | ICD-10-CM | POA: Diagnosis not present

## 2019-01-21 DIAGNOSIS — I4891 Unspecified atrial fibrillation: Secondary | ICD-10-CM | POA: Diagnosis not present

## 2019-01-22 DIAGNOSIS — I639 Cerebral infarction, unspecified: Secondary | ICD-10-CM | POA: Diagnosis not present

## 2019-01-22 DIAGNOSIS — I4891 Unspecified atrial fibrillation: Secondary | ICD-10-CM | POA: Diagnosis not present

## 2019-01-23 DIAGNOSIS — I4891 Unspecified atrial fibrillation: Secondary | ICD-10-CM | POA: Diagnosis not present

## 2019-01-23 DIAGNOSIS — I639 Cerebral infarction, unspecified: Secondary | ICD-10-CM | POA: Diagnosis not present

## 2019-01-24 DIAGNOSIS — I4891 Unspecified atrial fibrillation: Secondary | ICD-10-CM | POA: Diagnosis not present

## 2019-01-24 DIAGNOSIS — I639 Cerebral infarction, unspecified: Secondary | ICD-10-CM | POA: Diagnosis not present

## 2019-01-25 DIAGNOSIS — I639 Cerebral infarction, unspecified: Secondary | ICD-10-CM | POA: Diagnosis not present

## 2019-01-25 DIAGNOSIS — I4891 Unspecified atrial fibrillation: Secondary | ICD-10-CM | POA: Diagnosis not present

## 2019-01-26 DIAGNOSIS — I639 Cerebral infarction, unspecified: Secondary | ICD-10-CM | POA: Diagnosis not present

## 2019-01-26 DIAGNOSIS — I4891 Unspecified atrial fibrillation: Secondary | ICD-10-CM | POA: Diagnosis not present

## 2019-01-27 DIAGNOSIS — I639 Cerebral infarction, unspecified: Secondary | ICD-10-CM | POA: Diagnosis not present

## 2019-01-27 DIAGNOSIS — I4891 Unspecified atrial fibrillation: Secondary | ICD-10-CM | POA: Diagnosis not present

## 2019-01-28 DIAGNOSIS — I4891 Unspecified atrial fibrillation: Secondary | ICD-10-CM | POA: Diagnosis not present

## 2019-01-28 DIAGNOSIS — I639 Cerebral infarction, unspecified: Secondary | ICD-10-CM | POA: Diagnosis not present

## 2019-01-29 DIAGNOSIS — I4891 Unspecified atrial fibrillation: Secondary | ICD-10-CM | POA: Diagnosis not present

## 2019-01-29 DIAGNOSIS — I639 Cerebral infarction, unspecified: Secondary | ICD-10-CM | POA: Diagnosis not present

## 2019-01-30 DIAGNOSIS — I639 Cerebral infarction, unspecified: Secondary | ICD-10-CM | POA: Diagnosis not present

## 2019-01-30 DIAGNOSIS — I4891 Unspecified atrial fibrillation: Secondary | ICD-10-CM | POA: Diagnosis not present

## 2019-01-31 DIAGNOSIS — I4891 Unspecified atrial fibrillation: Secondary | ICD-10-CM | POA: Diagnosis not present

## 2019-01-31 DIAGNOSIS — I639 Cerebral infarction, unspecified: Secondary | ICD-10-CM | POA: Diagnosis not present

## 2019-02-01 DIAGNOSIS — I4891 Unspecified atrial fibrillation: Secondary | ICD-10-CM | POA: Diagnosis not present

## 2019-02-01 DIAGNOSIS — I639 Cerebral infarction, unspecified: Secondary | ICD-10-CM | POA: Diagnosis not present

## 2019-02-02 DIAGNOSIS — I639 Cerebral infarction, unspecified: Secondary | ICD-10-CM | POA: Diagnosis not present

## 2019-02-02 DIAGNOSIS — I4891 Unspecified atrial fibrillation: Secondary | ICD-10-CM | POA: Diagnosis not present

## 2019-02-03 DIAGNOSIS — I4891 Unspecified atrial fibrillation: Secondary | ICD-10-CM | POA: Diagnosis not present

## 2019-02-03 DIAGNOSIS — I639 Cerebral infarction, unspecified: Secondary | ICD-10-CM | POA: Diagnosis not present

## 2019-02-04 DIAGNOSIS — I639 Cerebral infarction, unspecified: Secondary | ICD-10-CM | POA: Diagnosis not present

## 2019-02-04 DIAGNOSIS — I4891 Unspecified atrial fibrillation: Secondary | ICD-10-CM | POA: Diagnosis not present

## 2019-02-05 DIAGNOSIS — I4891 Unspecified atrial fibrillation: Secondary | ICD-10-CM | POA: Diagnosis not present

## 2019-02-05 DIAGNOSIS — I639 Cerebral infarction, unspecified: Secondary | ICD-10-CM | POA: Diagnosis not present

## 2019-02-06 DIAGNOSIS — I639 Cerebral infarction, unspecified: Secondary | ICD-10-CM | POA: Diagnosis not present

## 2019-02-06 DIAGNOSIS — I4891 Unspecified atrial fibrillation: Secondary | ICD-10-CM | POA: Diagnosis not present

## 2019-02-07 ENCOUNTER — Emergency Department (HOSPITAL_COMMUNITY): Payer: Medicare Other

## 2019-02-07 ENCOUNTER — Emergency Department (HOSPITAL_COMMUNITY)
Admission: EM | Admit: 2019-02-07 | Discharge: 2019-02-07 | Disposition: A | Discharge status: Home / Self Care | Payer: Medicare Other | Attending: Emergency Medicine | Admitting: Emergency Medicine

## 2019-02-07 DIAGNOSIS — R4189 Other symptoms and signs involving cognitive functions and awareness: Secondary | ICD-10-CM | POA: Insufficient documentation

## 2019-02-07 DIAGNOSIS — Z85528 Personal history of other malignant neoplasm of kidney: Secondary | ICD-10-CM | POA: Insufficient documentation

## 2019-02-07 DIAGNOSIS — I5032 Chronic diastolic (congestive) heart failure: Secondary | ICD-10-CM | POA: Insufficient documentation

## 2019-02-07 DIAGNOSIS — I130 Hypertensive heart and chronic kidney disease with heart failure and stage 1 through stage 4 chronic kidney disease, or unspecified chronic kidney disease: Secondary | ICD-10-CM | POA: Diagnosis not present

## 2019-02-07 DIAGNOSIS — E1122 Type 2 diabetes mellitus with diabetic chronic kidney disease: Secondary | ICD-10-CM | POA: Diagnosis not present

## 2019-02-07 DIAGNOSIS — F0390 Unspecified dementia without behavioral disturbance: Secondary | ICD-10-CM | POA: Diagnosis not present

## 2019-02-07 DIAGNOSIS — N390 Urinary tract infection, site not specified: Secondary | ICD-10-CM

## 2019-02-07 DIAGNOSIS — J449 Chronic obstructive pulmonary disease, unspecified: Secondary | ICD-10-CM | POA: Diagnosis not present

## 2019-02-07 DIAGNOSIS — Z79899 Other long term (current) drug therapy: Secondary | ICD-10-CM | POA: Diagnosis not present

## 2019-02-07 DIAGNOSIS — Z7722 Contact with and (suspected) exposure to environmental tobacco smoke (acute) (chronic): Secondary | ICD-10-CM | POA: Diagnosis not present

## 2019-02-07 DIAGNOSIS — I959 Hypotension, unspecified: Secondary | ICD-10-CM | POA: Diagnosis not present

## 2019-02-07 DIAGNOSIS — Z7901 Long term (current) use of anticoagulants: Secondary | ICD-10-CM | POA: Diagnosis not present

## 2019-02-07 DIAGNOSIS — R4020 Unspecified coma: Secondary | ICD-10-CM | POA: Diagnosis not present

## 2019-02-07 DIAGNOSIS — I447 Left bundle-branch block, unspecified: Secondary | ICD-10-CM | POA: Diagnosis not present

## 2019-02-07 DIAGNOSIS — R0689 Other abnormalities of breathing: Secondary | ICD-10-CM | POA: Diagnosis not present

## 2019-02-07 DIAGNOSIS — I4891 Unspecified atrial fibrillation: Secondary | ICD-10-CM | POA: Diagnosis not present

## 2019-02-07 DIAGNOSIS — I639 Cerebral infarction, unspecified: Secondary | ICD-10-CM | POA: Diagnosis not present

## 2019-02-07 DIAGNOSIS — N183 Chronic kidney disease, stage 3 (moderate): Secondary | ICD-10-CM | POA: Diagnosis not present

## 2019-02-07 DIAGNOSIS — R55 Syncope and collapse: Secondary | ICD-10-CM | POA: Diagnosis not present

## 2019-02-07 LAB — COMPREHENSIVE METABOLIC PANEL
ALT: 9 U/L (ref 0–44)
AST: 13 U/L — ABNORMAL LOW (ref 15–41)
Albumin: 3.3 g/dL — ABNORMAL LOW (ref 3.5–5.0)
Alkaline Phosphatase: 61 U/L (ref 38–126)
Anion gap: 6 (ref 5–15)
BUN: 25 mg/dL — ABNORMAL HIGH (ref 8–23)
CO2: 25 mmol/L (ref 22–32)
Calcium: 8.9 mg/dL (ref 8.9–10.3)
Chloride: 112 mmol/L — ABNORMAL HIGH (ref 98–111)
Creatinine, Ser: 1.57 mg/dL — ABNORMAL HIGH (ref 0.44–1.00)
GFR calc Af Amer: 34 mL/min — ABNORMAL LOW (ref >60)
GFR calc non Af Amer: 29 mL/min — ABNORMAL LOW (ref >60)
Glucose, Bld: 132 mg/dL — ABNORMAL HIGH (ref 70–99)
Potassium: 4 mmol/L (ref 3.5–5.1)
Sodium: 143 mmol/L (ref 135–145)
Total Bilirubin: 0.8 mg/dL (ref 0.3–1.2)
Total Protein: 5.6 g/dL — ABNORMAL LOW (ref 6.5–8.1)

## 2019-02-07 LAB — URINALYSIS, ROUTINE W REFLEX MICROSCOPIC
Bilirubin Urine: NEGATIVE
Glucose, UA: NEGATIVE mg/dL
Hgb urine dipstick: NEGATIVE
Ketones, ur: NEGATIVE mg/dL
Nitrite: POSITIVE — AB
Protein, ur: 30 mg/dL — AB
Specific Gravity, Urine: 1.013 (ref 1.005–1.030)
WBC, UA: >50 WBC/hpf — ABNORMAL HIGH (ref 0–5)
pH: 5 (ref 5.0–8.0)

## 2019-02-07 LAB — CBC WITH DIFFERENTIAL/PLATELET
Abs Immature Granulocytes: 0.02 10*3/uL (ref 0.00–0.07)
Basophils Absolute: 0 10*3/uL (ref 0.0–0.1)
Basophils Relative: 1 %
Eosinophils Absolute: 0.2 10*3/uL (ref 0.0–0.5)
Eosinophils Relative: 3 %
HCT: 34.3 % — ABNORMAL LOW (ref 36.0–46.0)
Hemoglobin: 11 g/dL — ABNORMAL LOW (ref 12.0–15.0)
Immature Granulocytes: 0 %
Lymphocytes Relative: 29 %
Lymphs Abs: 2 10*3/uL (ref 0.7–4.0)
MCH: 32.9 pg (ref 26.0–34.0)
MCHC: 32.1 g/dL (ref 30.0–36.0)
MCV: 102.7 fL — ABNORMAL HIGH (ref 80.0–100.0)
Monocytes Absolute: 0.5 10*3/uL (ref 0.1–1.0)
Monocytes Relative: 7 %
Neutro Abs: 4.2 10*3/uL (ref 1.7–7.7)
Neutrophils Relative %: 60 %
Platelets: 168 10*3/uL (ref 150–400)
RBC: 3.34 MIL/uL — ABNORMAL LOW (ref 3.87–5.11)
RDW: 12.9 % (ref 11.5–15.5)
WBC: 6.9 10*3/uL (ref 4.0–10.5)
nRBC: 0 % (ref 0.0–0.2)

## 2019-02-07 LAB — TROPONIN I (HIGH SENSITIVITY)
Troponin I (High Sensitivity): 9 ng/L (ref <18)
Troponin I (High Sensitivity): 9 ng/L (ref <18)

## 2019-02-07 LAB — CBG MONITORING, ED: Glucose-Capillary: 119 mg/dL — ABNORMAL HIGH (ref 70–99)

## 2019-02-07 MED ORDER — SODIUM CHLORIDE 0.9 % IV SOLN
1.0000 g | Freq: Once | INTRAVENOUS | Status: AC
  Administered 2019-02-07: 1 g via INTRAVENOUS
  Filled 2019-02-07: qty 10

## 2019-02-07 MED ORDER — CEPHALEXIN 500 MG PO CAPS: 500.0000 mg | ORAL_CAPSULE | Freq: Two times a day (BID) | ORAL | 0 refills | Status: DC

## 2019-02-07 NOTE — ED Notes (Signed)
Pt assisted to eat apple juice, few sips of cranberry juice.

## 2019-02-07 NOTE — ED Notes (Signed)
This RN spoke with pts husband, [name].   Per husband, he was trying to help pt up from table "and she kind of blanked out. She did the same thing about 5 months ago"  Husband reports pt has hx of stroke. Husband reports he will be here in appx 30 minutes.

## 2019-02-07 NOTE — ED Notes (Signed)
Discharge paperwork reviewed with pts husband, including provided prescription.  Husband verbalized understanding.  Pt wheeled to ED exit, assisted into car.

## 2019-02-07 NOTE — ED Provider Notes (Signed)
TIME SEEN: 9:43 AM  CHIEF COMPLAINT: Syncope  HPI: Patient is an 85 year old female with history of atrial fibrillation on Eliquis, CHF, chronic kidney disease, COPD, diabetes, dementia, hypertension, hyperlipidemia who presents to the emergency department after syncopal event.  Per EMS, patient passed out while sitting at the kitchen table this morning.  Unclear if she is at her mental baseline.  Husband is on the way to the emergency department.  Vitals normal with EMS.  Blood glucose normal.  Patient unable to answer any questions appropriately due to dementia.  ROS: Level 5 caveat secondary to dementia  PAST MEDICAL HISTORY/PAST SURGICAL HISTORY:  Past Medical History:  Diagnosis Date  . A-fib ([place])   . Anemia    anemia of renal disease  . Arthritis    mutiple joints  . Cancer [facility])    Renal neoplasm- survellience via Urology  . CHF (congestive heart failure) ([place])    Diastolic dys-- EF 82% july 2011  . Chronic kidney disease    Stage III- followed by [facility]  . COPD (chronic obstructive pulmonary disease) ([place])   . Dementia, vascular, mixed, with behavioral disturbance ([place]) 01/31/2016  . Diabetes mellitus   . DIASTOLIC DYSFUNCTION 9/56/2130   Qualifier: Diagnosis of  By: Buelah Manis MD, Lonell Grandchild    . GERD (gastroesophageal reflux disease)   . H/O hiatal hernia   . Hyperlipidemia   . Hypertension   . Left bundle branch block   . NEOPLASM, KIDNEY 03/04/2007   complex right renal cyst; Per patient- Urologist no longer following- no changes.      . Nonischemic cardiomyopathy ([place])   . Peripheral neuropathy   . Pneumonia   . Shortness of breath    Hx: of with exertion  . Venous insufficiency 02/24/2011    MEDICATIONS:  Prior to Admission medications   Medication Sig Start Date End Date Taking? Authorizing Provider  apixaban (ELIQUIS) 2.5 MG TABS tablet Take 1 tablet (2.5 mg total) by mouth 2 (two) times daily. 08/11/18 08/06/19  Doristine Mango L, DO  carvedilol (COREG)  3.125 MG tablet Take 1 tablet (3.125 mg total) by mouth 2 (two) times daily with a meal. 12/17/18 06/15/19  Anderson, Chelsey L, DO  cholecalciferol (VITAMIN D3) 25 MCG (1000 UT) tablet Take 1 tablet (1,000 Units total) by mouth daily. 08/11/18   Anderson, Chelsey L, DO  QUEtiapine (SEROQUEL) 50 MG tablet Take 2 tablets (100 mg total) by mouth at bedtime. 08/11/18 08/06/19  Doristine Mango L, DO  vitamin B-12 (CYANOCOBALAMIN) 100 MCG tablet Take 1 tablet (100 mcg total) by mouth daily. 11/26/17   Mikell, Jeani Sow, MD  albuterol (PROVENTIL HFA;VENTOLIN HFA) 108 (90 Base) MCG/ACT inhaler Inhale 2 puffs into the lungs every 6 (six) hours as needed for wheezing or shortness of breath. 08/08/15 08/11/18  Rosemarie Ax, MD    ALLERGIES:  No Known Allergies  SOCIAL HISTORY:  Social History   Tobacco Use  . Smoking status: Passive Smoke Exposure - Never Smoker  . Smokeless tobacco: Never Used  Substance Use Topics  . Alcohol use: No    FAMILY HISTORY: Family History  Problem Relation Age of Onset  . Heart disease Mother   . Arthritis Mother   . Drug abuse Daughter   . Early death Daughter     EXAM: BP (!) 141/88   Pulse 64   Temp 98.6 F (37 C) (Oral)   Resp (!) 25   SpO2 94%  CONSTITUTIONAL: Alert but unable to answer questions or  follow commands.  Elderly.  Moaning intermittently but then will be calm and in no distress. HEAD: Normocephalic; atraumatic EYES: Conjunctivae clear, PERRL, extraocular movements appear intact ENT: normal nose; no rhinorrhea; moist mucous membranes; pharynx without lesions noted; no dental injury; multiple previous missing teeth, no septal hematoma NECK: Supple,  no LAD; no midline spinal tenderness, step-off or deformity; trachea midline CARD: RRR; S1 and S2 appreciated; no murmurs, no clicks, no rubs, no gallops RESP: Normal chest excursion without splinting or tachypnea; breath sounds clear and equal bilaterally; no wheezes, no rhonchi, no rales; no  hypoxia or respiratory distress CHEST:  chest wall stable, no crepitus or ecchymosis or deformity, nontender to palpation; no flail chest ABD/GI: Normal bowel sounds; non-distended; soft, non-tender, no rebound, no guarding; no ecchymosis or other lesions noted PELVIS:  stable, nontender to palpation BACK:  The back appears normal and is non-tender to palpation, there is no CVA tenderness; no midline spinal tenderness, step-off or deformity EXT: Patient has no obvious bony deformity or bony tenderness on exam.  She has 2+ DP pulses and radial pulses bilaterally.  Patient will not move her extremities on command but I am able to passively move all of her joints without any signs of discomfort.  No ecchymosis or swelling.  Compartments are soft.  No lacerations. SKIN: Normal color for age and race; warm NEURO: Moves all extremities equally, does not follow commands or answer questions appropriately, no dysarthria, unable to test sensation   MEDICAL DECISION MAKING: Patient here after syncopal event at home.  Vital signs here are normal.  Will obtain EKG, labs, head CT given patient is on Eliquis and possible AMS per EMS from her baseline, chest x-ray, urine.  Her husband is on his way to the emergency department to provide more information.  ED PROGRESS: Patient's husband is at bedside.  He states that she was in her normal state of health this morning and sitting at the kitchen table in a chair.  He states he went to talk to her and her head was down and she would not respond.  He states this lasted 2 to 3 minutes.  He reports when EMS got there and put her on the cold kitchen floor she woke up immediately.  No seizure activity.  No recent fevers, cough, vomiting, diarrhea.  No medication changes.  States that she has had this happen to her once before and he was told that her "heart medication was too strong".  No falls or head injury.  He states he thinks she is still on Eliquis.  He reports she is at  her baseline currently.  He agrees with work-up in the ED but if work-up is unremarkable, he is comfortable with plan for discharge home and we both agree that patient would likely do better at home than here in the hospital.   12:45 PM  Pt's second troponin is negative.  I think the reason she had an episode of unresponsiveness today was because of her UTI.  I think she is safe to be discharged home with her husband.  I have updated her husband by phone.  She has been resting comfortably here without complaints.  No events noted on cardiac monitoring.  Discussed return precautions.  She has a PCP for follow-up.   At this time, I do not feel there is any life-threatening condition present. I have reviewed and discussed all results (EKG, imaging, lab, urine as appropriate) and exam findings with patient/family. I have reviewed nursing  notes and appropriate previous records.  I feel the patient is safe to be discharged home without further emergent workup and can continue workup as an outpatient as needed. Discussed usual and customary return precautions. Patient/family verbalize understanding and are comfortable with this plan.  Outpatient follow-up has been provided as needed. All questions have been answered.   EKG Interpretation  Date/Time:  Monday February 07 2019 09:37:54 EDT Ventricular Rate:  60 PR Interval:    QRS Duration: 147 QT Interval:  429 QTC Calculation: 403 R Axis:   -12 Text Interpretation:  Sinus rhythm Atrial premature complexes Left bundle branch block Baseline wander in lead(s) V4 No significant change since last tracing Confirmed by Alilah Mcmeans, Cyril Mourning [phone]) on 02/07/2019 10:13:33 AM          Sarea Fyfe, Delice Bison, DO 02/07/19 1245

## 2019-02-07 NOTE — ED Notes (Signed)
Pt transported to and from CT.

## 2019-02-07 NOTE — Discharge Instructions (Addendum)
Please follow-up with your primary care physician in 1 week for reevaluation.

## 2019-02-07 NOTE — ED Triage Notes (Signed)
Pt BIBA from home.    Per EMS- Pt was sitting at kitchen table appx 0834 when she had syncopal episode.  Husband called 911, FD on scene found pt unconscious, became conscious shortly after their arrival.  appx unconscious 2-3 min. Hx of dementia. EMS reports pt was not at baseline at time of departure from home.

## 2019-02-07 NOTE — ED Notes (Signed)
Attempted to contact pts husband, [name], to inform him of pts discharge status.  No answer.

## 2019-02-08 DIAGNOSIS — I639 Cerebral infarction, unspecified: Secondary | ICD-10-CM | POA: Diagnosis not present

## 2019-02-08 DIAGNOSIS — I4891 Unspecified atrial fibrillation: Secondary | ICD-10-CM | POA: Diagnosis not present

## 2019-02-09 DIAGNOSIS — I4891 Unspecified atrial fibrillation: Secondary | ICD-10-CM | POA: Diagnosis not present

## 2019-02-09 DIAGNOSIS — I639 Cerebral infarction, unspecified: Secondary | ICD-10-CM | POA: Diagnosis not present

## 2019-02-09 LAB — URINE CULTURE: Culture: >=100000 — AB

## 2019-02-10 ENCOUNTER — Telehealth: Payer: Self-pay

## 2019-02-10 DIAGNOSIS — I4891 Unspecified atrial fibrillation: Secondary | ICD-10-CM | POA: Diagnosis not present

## 2019-02-10 DIAGNOSIS — I639 Cerebral infarction, unspecified: Secondary | ICD-10-CM | POA: Diagnosis not present

## 2019-02-10 NOTE — Telephone Encounter (Signed)
Post ED Visit - Positive Culture Follow-up  Culture report reviewed by antimicrobial stewardship pharmacist: [place] Team []  Elenor Quinones, Pharm.D. []  Heide Guile, Pharm.D., BCPS AQ-ID []  Parks Neptune, Pharm.D., BCPS []  Alycia Rossetti, Pharm.D., BCPS []  [place], Pharm.D., BCPS, AAHIVP []  Legrand Como, Pharm.D., BCPS, AAHIVP []  Salome Arnt, PharmD, BCPS []  Johnnette Gourd, PharmD, BCPS []  Hughes Better, PharmD, BCPS []  Leeroy Cha, PharmD []  Laqueta Linden, PharmD, BCPS []  Albertina Parr, PharmD  [place] Team []  Leodis Sias, PharmD []  Lindell Spar, PharmD []  Royetta Asal, PharmD []  Graylin Shiver, Rph []  Rema Fendt) Glennon Mac, PharmD []  Arlyn Dunning, PharmD []  Netta Cedars, PharmD []  Dia Sitter, PharmD []  Leone Haven, PharmD []  Gretta Arab, PharmD []  Theodis Shove, PharmD []  Peggyann Juba, PharmD [x]  Reuel Boom, PharmD   Positive urine culture Treated with Cephalexin, organism sensitive to the same and no further patient follow-up is required at this time.  Genia Del 02/10/2019, 11:50 AM

## 2019-02-11 DIAGNOSIS — I639 Cerebral infarction, unspecified: Secondary | ICD-10-CM | POA: Diagnosis not present

## 2019-02-11 DIAGNOSIS — I4891 Unspecified atrial fibrillation: Secondary | ICD-10-CM | POA: Diagnosis not present

## 2019-02-12 DIAGNOSIS — I639 Cerebral infarction, unspecified: Secondary | ICD-10-CM | POA: Diagnosis not present

## 2019-02-12 DIAGNOSIS — I4891 Unspecified atrial fibrillation: Secondary | ICD-10-CM | POA: Diagnosis not present

## 2019-02-21 DIAGNOSIS — I639 Cerebral infarction, unspecified: Secondary | ICD-10-CM | POA: Diagnosis not present

## 2019-02-21 DIAGNOSIS — I4891 Unspecified atrial fibrillation: Secondary | ICD-10-CM | POA: Diagnosis not present

## 2019-02-22 DIAGNOSIS — I639 Cerebral infarction, unspecified: Secondary | ICD-10-CM | POA: Diagnosis not present

## 2019-02-22 DIAGNOSIS — I4891 Unspecified atrial fibrillation: Secondary | ICD-10-CM | POA: Diagnosis not present

## 2019-02-23 DIAGNOSIS — I639 Cerebral infarction, unspecified: Secondary | ICD-10-CM | POA: Diagnosis not present

## 2019-02-23 DIAGNOSIS — I4891 Unspecified atrial fibrillation: Secondary | ICD-10-CM | POA: Diagnosis not present

## 2019-02-24 DIAGNOSIS — I639 Cerebral infarction, unspecified: Secondary | ICD-10-CM | POA: Diagnosis not present

## 2019-02-24 DIAGNOSIS — I4891 Unspecified atrial fibrillation: Secondary | ICD-10-CM | POA: Diagnosis not present

## 2019-02-25 DIAGNOSIS — I4891 Unspecified atrial fibrillation: Secondary | ICD-10-CM | POA: Diagnosis not present

## 2019-02-25 DIAGNOSIS — I639 Cerebral infarction, unspecified: Secondary | ICD-10-CM | POA: Diagnosis not present

## 2019-02-26 DIAGNOSIS — I4891 Unspecified atrial fibrillation: Secondary | ICD-10-CM | POA: Diagnosis not present

## 2019-02-26 DIAGNOSIS — I639 Cerebral infarction, unspecified: Secondary | ICD-10-CM | POA: Diagnosis not present

## 2019-02-27 DIAGNOSIS — I639 Cerebral infarction, unspecified: Secondary | ICD-10-CM | POA: Diagnosis not present

## 2019-02-27 DIAGNOSIS — I4891 Unspecified atrial fibrillation: Secondary | ICD-10-CM | POA: Diagnosis not present

## 2019-02-28 DIAGNOSIS — I4891 Unspecified atrial fibrillation: Secondary | ICD-10-CM | POA: Diagnosis not present

## 2019-02-28 DIAGNOSIS — I639 Cerebral infarction, unspecified: Secondary | ICD-10-CM | POA: Diagnosis not present

## 2019-03-01 DIAGNOSIS — I4891 Unspecified atrial fibrillation: Secondary | ICD-10-CM | POA: Diagnosis not present

## 2019-03-01 DIAGNOSIS — I639 Cerebral infarction, unspecified: Secondary | ICD-10-CM | POA: Diagnosis not present

## 2019-03-02 DIAGNOSIS — I639 Cerebral infarction, unspecified: Secondary | ICD-10-CM | POA: Diagnosis not present

## 2019-03-02 DIAGNOSIS — I4891 Unspecified atrial fibrillation: Secondary | ICD-10-CM | POA: Diagnosis not present

## 2019-03-03 DIAGNOSIS — I4891 Unspecified atrial fibrillation: Secondary | ICD-10-CM | POA: Diagnosis not present

## 2019-03-03 DIAGNOSIS — I639 Cerebral infarction, unspecified: Secondary | ICD-10-CM | POA: Diagnosis not present

## 2019-03-04 DIAGNOSIS — I639 Cerebral infarction, unspecified: Secondary | ICD-10-CM | POA: Diagnosis not present

## 2019-03-04 DIAGNOSIS — I4891 Unspecified atrial fibrillation: Secondary | ICD-10-CM | POA: Diagnosis not present

## 2019-03-05 DIAGNOSIS — I639 Cerebral infarction, unspecified: Secondary | ICD-10-CM | POA: Diagnosis not present

## 2019-03-05 DIAGNOSIS — I4891 Unspecified atrial fibrillation: Secondary | ICD-10-CM | POA: Diagnosis not present

## 2019-03-07 DIAGNOSIS — I4891 Unspecified atrial fibrillation: Secondary | ICD-10-CM | POA: Diagnosis not present

## 2019-03-07 DIAGNOSIS — I639 Cerebral infarction, unspecified: Secondary | ICD-10-CM | POA: Diagnosis not present

## 2019-03-08 DIAGNOSIS — I639 Cerebral infarction, unspecified: Secondary | ICD-10-CM | POA: Diagnosis not present

## 2019-03-08 DIAGNOSIS — I4891 Unspecified atrial fibrillation: Secondary | ICD-10-CM | POA: Diagnosis not present

## 2019-03-09 DIAGNOSIS — I4891 Unspecified atrial fibrillation: Secondary | ICD-10-CM | POA: Diagnosis not present

## 2019-03-09 DIAGNOSIS — I639 Cerebral infarction, unspecified: Secondary | ICD-10-CM | POA: Diagnosis not present

## 2019-03-10 DIAGNOSIS — I4891 Unspecified atrial fibrillation: Secondary | ICD-10-CM | POA: Diagnosis not present

## 2019-03-10 DIAGNOSIS — I639 Cerebral infarction, unspecified: Secondary | ICD-10-CM | POA: Diagnosis not present

## 2019-03-11 DIAGNOSIS — I639 Cerebral infarction, unspecified: Secondary | ICD-10-CM | POA: Diagnosis not present

## 2019-03-11 DIAGNOSIS — I4891 Unspecified atrial fibrillation: Secondary | ICD-10-CM | POA: Diagnosis not present

## 2019-03-12 DIAGNOSIS — I639 Cerebral infarction, unspecified: Secondary | ICD-10-CM | POA: Diagnosis not present

## 2019-03-12 DIAGNOSIS — I4891 Unspecified atrial fibrillation: Secondary | ICD-10-CM | POA: Diagnosis not present

## 2019-03-14 DIAGNOSIS — I4891 Unspecified atrial fibrillation: Secondary | ICD-10-CM | POA: Diagnosis not present

## 2019-03-14 DIAGNOSIS — I639 Cerebral infarction, unspecified: Secondary | ICD-10-CM | POA: Diagnosis not present

## 2019-03-15 DIAGNOSIS — I639 Cerebral infarction, unspecified: Secondary | ICD-10-CM | POA: Diagnosis not present

## 2019-03-15 DIAGNOSIS — I4891 Unspecified atrial fibrillation: Secondary | ICD-10-CM | POA: Diagnosis not present

## 2019-03-16 DIAGNOSIS — I639 Cerebral infarction, unspecified: Secondary | ICD-10-CM | POA: Diagnosis not present

## 2019-03-16 DIAGNOSIS — I4891 Unspecified atrial fibrillation: Secondary | ICD-10-CM | POA: Diagnosis not present

## 2019-03-17 DIAGNOSIS — I4891 Unspecified atrial fibrillation: Secondary | ICD-10-CM | POA: Diagnosis not present

## 2019-03-17 DIAGNOSIS — I639 Cerebral infarction, unspecified: Secondary | ICD-10-CM | POA: Diagnosis not present

## 2019-03-21 DIAGNOSIS — I4891 Unspecified atrial fibrillation: Secondary | ICD-10-CM | POA: Diagnosis not present

## 2019-03-21 DIAGNOSIS — I639 Cerebral infarction, unspecified: Secondary | ICD-10-CM | POA: Diagnosis not present

## 2019-03-23 DIAGNOSIS — I639 Cerebral infarction, unspecified: Secondary | ICD-10-CM | POA: Diagnosis not present

## 2019-03-23 DIAGNOSIS — I4891 Unspecified atrial fibrillation: Secondary | ICD-10-CM | POA: Diagnosis not present

## 2019-03-24 DIAGNOSIS — I4891 Unspecified atrial fibrillation: Secondary | ICD-10-CM | POA: Diagnosis not present

## 2019-03-24 DIAGNOSIS — I639 Cerebral infarction, unspecified: Secondary | ICD-10-CM | POA: Diagnosis not present

## 2019-03-25 DIAGNOSIS — I639 Cerebral infarction, unspecified: Secondary | ICD-10-CM | POA: Diagnosis not present

## 2019-03-25 DIAGNOSIS — I4891 Unspecified atrial fibrillation: Secondary | ICD-10-CM | POA: Diagnosis not present

## 2019-03-27 DIAGNOSIS — I639 Cerebral infarction, unspecified: Secondary | ICD-10-CM | POA: Diagnosis not present

## 2019-03-27 DIAGNOSIS — I4891 Unspecified atrial fibrillation: Secondary | ICD-10-CM | POA: Diagnosis not present

## 2019-03-28 DIAGNOSIS — I4891 Unspecified atrial fibrillation: Secondary | ICD-10-CM | POA: Diagnosis not present

## 2019-03-28 DIAGNOSIS — I639 Cerebral infarction, unspecified: Secondary | ICD-10-CM | POA: Diagnosis not present

## 2019-03-29 DIAGNOSIS — I639 Cerebral infarction, unspecified: Secondary | ICD-10-CM | POA: Diagnosis not present

## 2019-03-29 DIAGNOSIS — I4891 Unspecified atrial fibrillation: Secondary | ICD-10-CM | POA: Diagnosis not present

## 2019-03-30 DIAGNOSIS — I4891 Unspecified atrial fibrillation: Secondary | ICD-10-CM | POA: Diagnosis not present

## 2019-03-30 DIAGNOSIS — I639 Cerebral infarction, unspecified: Secondary | ICD-10-CM | POA: Diagnosis not present

## 2019-03-31 DIAGNOSIS — I4891 Unspecified atrial fibrillation: Secondary | ICD-10-CM | POA: Diagnosis not present

## 2019-03-31 DIAGNOSIS — I639 Cerebral infarction, unspecified: Secondary | ICD-10-CM | POA: Diagnosis not present

## 2019-04-01 DIAGNOSIS — I639 Cerebral infarction, unspecified: Secondary | ICD-10-CM | POA: Diagnosis not present

## 2019-04-01 DIAGNOSIS — I4891 Unspecified atrial fibrillation: Secondary | ICD-10-CM | POA: Diagnosis not present

## 2019-04-02 DIAGNOSIS — I639 Cerebral infarction, unspecified: Secondary | ICD-10-CM | POA: Diagnosis not present

## 2019-04-02 DIAGNOSIS — I4891 Unspecified atrial fibrillation: Secondary | ICD-10-CM | POA: Diagnosis not present

## 2019-04-03 DIAGNOSIS — I639 Cerebral infarction, unspecified: Secondary | ICD-10-CM | POA: Diagnosis not present

## 2019-04-03 DIAGNOSIS — I4891 Unspecified atrial fibrillation: Secondary | ICD-10-CM | POA: Diagnosis not present

## 2019-04-04 DIAGNOSIS — I4891 Unspecified atrial fibrillation: Secondary | ICD-10-CM | POA: Diagnosis not present

## 2019-04-04 DIAGNOSIS — I639 Cerebral infarction, unspecified: Secondary | ICD-10-CM | POA: Diagnosis not present

## 2019-04-05 DIAGNOSIS — I4891 Unspecified atrial fibrillation: Secondary | ICD-10-CM | POA: Diagnosis not present

## 2019-04-05 DIAGNOSIS — I639 Cerebral infarction, unspecified: Secondary | ICD-10-CM | POA: Diagnosis not present

## 2019-04-06 DIAGNOSIS — I4891 Unspecified atrial fibrillation: Secondary | ICD-10-CM | POA: Diagnosis not present

## 2019-04-06 DIAGNOSIS — I639 Cerebral infarction, unspecified: Secondary | ICD-10-CM | POA: Diagnosis not present

## 2019-04-07 DIAGNOSIS — I4891 Unspecified atrial fibrillation: Secondary | ICD-10-CM | POA: Diagnosis not present

## 2019-04-07 DIAGNOSIS — I639 Cerebral infarction, unspecified: Secondary | ICD-10-CM | POA: Diagnosis not present

## 2019-04-08 DIAGNOSIS — I4891 Unspecified atrial fibrillation: Secondary | ICD-10-CM | POA: Diagnosis not present

## 2019-04-08 DIAGNOSIS — I639 Cerebral infarction, unspecified: Secondary | ICD-10-CM | POA: Diagnosis not present

## 2019-04-09 DIAGNOSIS — I639 Cerebral infarction, unspecified: Secondary | ICD-10-CM | POA: Diagnosis not present

## 2019-04-09 DIAGNOSIS — I4891 Unspecified atrial fibrillation: Secondary | ICD-10-CM | POA: Diagnosis not present

## 2019-04-10 DIAGNOSIS — I639 Cerebral infarction, unspecified: Secondary | ICD-10-CM | POA: Diagnosis not present

## 2019-04-10 DIAGNOSIS — I4891 Unspecified atrial fibrillation: Secondary | ICD-10-CM | POA: Diagnosis not present

## 2019-04-11 DIAGNOSIS — I639 Cerebral infarction, unspecified: Secondary | ICD-10-CM | POA: Diagnosis not present

## 2019-04-11 DIAGNOSIS — I4891 Unspecified atrial fibrillation: Secondary | ICD-10-CM | POA: Diagnosis not present

## 2019-04-12 DIAGNOSIS — I4891 Unspecified atrial fibrillation: Secondary | ICD-10-CM | POA: Diagnosis not present

## 2019-04-12 DIAGNOSIS — I639 Cerebral infarction, unspecified: Secondary | ICD-10-CM | POA: Diagnosis not present

## 2019-04-12 DIAGNOSIS — S329XXA Fracture of unspecified parts of lumbosacral spine and pelvis, initial encounter for closed fracture: Secondary | ICD-10-CM | POA: Diagnosis not present

## 2019-04-12 DIAGNOSIS — M353 Polymyalgia rheumatica: Secondary | ICD-10-CM | POA: Diagnosis not present

## 2019-04-13 DIAGNOSIS — I639 Cerebral infarction, unspecified: Secondary | ICD-10-CM | POA: Diagnosis not present

## 2019-04-13 DIAGNOSIS — I4891 Unspecified atrial fibrillation: Secondary | ICD-10-CM | POA: Diagnosis not present

## 2019-04-14 ENCOUNTER — Telehealth: Payer: Self-pay | Admitting: Student in an Organized Health Care Education/Training Program

## 2019-04-14 DIAGNOSIS — I639 Cerebral infarction, unspecified: Secondary | ICD-10-CM | POA: Diagnosis not present

## 2019-04-14 DIAGNOSIS — I4891 Unspecified atrial fibrillation: Secondary | ICD-10-CM | POA: Diagnosis not present

## 2019-04-14 NOTE — Telephone Encounter (Signed)
i'd like to see this patient in complex clinic. Attempting to contact and schedule.

## 2019-04-15 ENCOUNTER — Telehealth: Payer: Self-pay

## 2019-04-15 DIAGNOSIS — I639 Cerebral infarction, unspecified: Secondary | ICD-10-CM | POA: Diagnosis not present

## 2019-04-15 DIAGNOSIS — I4891 Unspecified atrial fibrillation: Secondary | ICD-10-CM | POA: Diagnosis not present

## 2019-04-15 NOTE — Telephone Encounter (Signed)
Called to make appointment for complex clinic on 10/62/2020.  Patients husband states that it is difficult for him to get her during the morning as 0830 was the the only slot available on the 26th.  Appointment was scheduled for 04/21/2019 at 1510.  Patient's husband wants to pass along that he has glasses and flu shot.  Ozella Almond, [place]

## 2019-04-16 ENCOUNTER — Emergency Department (HOSPITAL_COMMUNITY): Payer: Medicare Other

## 2019-04-16 ENCOUNTER — Emergency Department (HOSPITAL_COMMUNITY)
Admission: EM | Admit: 2019-04-16 | Discharge: 2019-04-16 | Disposition: A | Discharge status: Home / Self Care | Payer: Medicare Other | Attending: Emergency Medicine | Admitting: Emergency Medicine

## 2019-04-16 ENCOUNTER — Encounter (HOSPITAL_COMMUNITY): Payer: Self-pay | Admitting: Emergency Medicine

## 2019-04-16 ENCOUNTER — Other Ambulatory Visit: Payer: Self-pay

## 2019-04-16 DIAGNOSIS — N183 Chronic kidney disease, stage 3 unspecified: Secondary | ICD-10-CM | POA: Insufficient documentation

## 2019-04-16 DIAGNOSIS — Z79899 Other long term (current) drug therapy: Secondary | ICD-10-CM | POA: Insufficient documentation

## 2019-04-16 DIAGNOSIS — R404 Transient alteration of awareness: Secondary | ICD-10-CM | POA: Diagnosis not present

## 2019-04-16 DIAGNOSIS — Z20828 Contact with and (suspected) exposure to other viral communicable diseases: Secondary | ICD-10-CM | POA: Insufficient documentation

## 2019-04-16 DIAGNOSIS — J449 Chronic obstructive pulmonary disease, unspecified: Secondary | ICD-10-CM | POA: Insufficient documentation

## 2019-04-16 DIAGNOSIS — I5032 Chronic diastolic (congestive) heart failure: Secondary | ICD-10-CM | POA: Diagnosis not present

## 2019-04-16 DIAGNOSIS — I130 Hypertensive heart and chronic kidney disease with heart failure and stage 1 through stage 4 chronic kidney disease, or unspecified chronic kidney disease: Secondary | ICD-10-CM | POA: Insufficient documentation

## 2019-04-16 DIAGNOSIS — R4182 Altered mental status, unspecified: Secondary | ICD-10-CM | POA: Diagnosis present

## 2019-04-16 DIAGNOSIS — E1122 Type 2 diabetes mellitus with diabetic chronic kidney disease: Secondary | ICD-10-CM | POA: Insufficient documentation

## 2019-04-16 DIAGNOSIS — R05 Cough: Secondary | ICD-10-CM | POA: Insufficient documentation

## 2019-04-16 DIAGNOSIS — Z7901 Long term (current) use of anticoagulants: Secondary | ICD-10-CM | POA: Insufficient documentation

## 2019-04-16 DIAGNOSIS — Z7722 Contact with and (suspected) exposure to environmental tobacco smoke (acute) (chronic): Secondary | ICD-10-CM | POA: Insufficient documentation

## 2019-04-16 LAB — URINALYSIS, ROUTINE W REFLEX MICROSCOPIC
Bacteria, UA: NONE SEEN
Bilirubin Urine: NEGATIVE
Glucose, UA: NEGATIVE mg/dL
Ketones, ur: NEGATIVE mg/dL
Leukocytes,Ua: NEGATIVE
Nitrite: NEGATIVE
Protein, ur: NEGATIVE mg/dL
Specific Gravity, Urine: 1.011 (ref 1.005–1.030)
pH: 6 (ref 5.0–8.0)

## 2019-04-16 LAB — COMPREHENSIVE METABOLIC PANEL
ALT: 8 U/L (ref 0–44)
AST: 12 U/L — ABNORMAL LOW (ref 15–41)
Albumin: 3.6 g/dL (ref 3.5–5.0)
Alkaline Phosphatase: 68 U/L (ref 38–126)
Anion gap: 7 (ref 5–15)
BUN: 22 mg/dL (ref 8–23)
CO2: 27 mmol/L (ref 22–32)
Calcium: 9.6 mg/dL (ref 8.9–10.3)
Chloride: 109 mmol/L (ref 98–111)
Creatinine, Ser: 1.63 mg/dL — ABNORMAL HIGH (ref 0.44–1.00)
GFR calc Af Amer: 32 mL/min — ABNORMAL LOW (ref >60)
GFR calc non Af Amer: 28 mL/min — ABNORMAL LOW (ref >60)
Glucose, Bld: 128 mg/dL — ABNORMAL HIGH (ref 70–99)
Potassium: 3.9 mmol/L (ref 3.5–5.1)
Sodium: 143 mmol/L (ref 135–145)
Total Bilirubin: 1.2 mg/dL (ref 0.3–1.2)
Total Protein: 6.4 g/dL — ABNORMAL LOW (ref 6.5–8.1)

## 2019-04-16 LAB — BLOOD GAS, ARTERIAL
Acid-Base Excess: 0.6 mmol/L (ref 0.0–2.0)
Bicarbonate: 26 mmol/L (ref 20.0–28.0)
O2 Saturation: 95.3 %
Patient temperature: 98.6
pCO2 arterial: 48.4 mmHg — ABNORMAL HIGH (ref 32.0–48.0)
pH, Arterial: 7.35 (ref 7.350–7.450)
pO2, Arterial: 79.7 mmHg — ABNORMAL LOW (ref 83.0–108.0)

## 2019-04-16 LAB — AMMONIA: Ammonia: 30 umol/L (ref 9–35)

## 2019-04-16 LAB — CBC
HCT: 37.4 % (ref 36.0–46.0)
Hemoglobin: 12 g/dL (ref 12.0–15.0)
MCH: 33.2 pg (ref 26.0–34.0)
MCHC: 32.1 g/dL (ref 30.0–36.0)
MCV: 103.6 fL — ABNORMAL HIGH (ref 80.0–100.0)
Platelets: 183 10*3/uL (ref 150–400)
RBC: 3.61 MIL/uL — ABNORMAL LOW (ref 3.87–5.11)
RDW: 12.6 % (ref 11.5–15.5)
WBC: 6.8 10*3/uL (ref 4.0–10.5)
nRBC: 0 % (ref 0.0–0.2)

## 2019-04-16 LAB — SARS CORONAVIRUS 2 (TAT 6-24 HRS): SARS Coronavirus 2: NEGATIVE

## 2019-04-16 LAB — CBG MONITORING, ED: Glucose-Capillary: 113 mg/dL — ABNORMAL HIGH (ref 70–99)

## 2019-04-16 MED ORDER — SODIUM CHLORIDE 0.9 % IV BOLUS (SEPSIS)
500.0000 mL | Freq: Once | INTRAVENOUS | Status: AC
  Administered 2019-04-16: 500 mL via INTRAVENOUS

## 2019-04-16 MED ORDER — SODIUM CHLORIDE 0.9% FLUSH
3.0000 mL | Freq: Once | INTRAVENOUS | Status: AC
  Administered 2019-04-16: 3 mL via INTRAVENOUS

## 2019-04-16 MED ORDER — SODIUM CHLORIDE 0.9 % IV SOLN
1000.0000 mL | INTRAVENOUS | Status: DC
  Administered 2019-04-16: 1000 mL via INTRAVENOUS

## 2019-04-16 NOTE — ED Provider Notes (Signed)
[facility] DEPT Provider Note   CSN: [id number] Arrival date & time: 04/16/19  0915     History   Chief Complaint Chief Complaint  Patient presents with  . Cough  . Altered Mental Status    HPI [name] is a 85 y.o. female.     HPI Patient presents to the emergency room for evaluation of altered mental status and cough.  Patient has a history of dementia and is unable to tell me what specifically brought her to the emergency room.  She denied any specific complaints.  Additional history was obtained later in the ED visit when the patient's husband arrived.  Patient's husband states she had an episode where she was not responding normally.  This lasted maybe a few minutes.  Husband states that in the past she was seen for this and there was an issue with one of her heart medications so he wanted her to get checked out.  She seems to be back to her normal now.  She has not been feverish.  There is been no vomiting or diarrhea. Past Medical History:  Diagnosis Date  . A-fib ([place])   . Anemia    anemia of renal disease  . Arthritis    mutiple joints  . Cancer [facility])    Renal neoplasm- survellience via Urology  . CHF (congestive heart failure) ([place])    Diastolic dys-- EF [id number] july 2011  . Chronic kidney disease    Stage III- followed by [facility]  . COPD (chronic obstructive pulmonary disease) ([place])   . Dementia, vascular, mixed, with behavioral disturbance ([place]) 01/31/2016  . Diabetes mellitus   . DIASTOLIC DYSFUNCTION 99991111   Qualifier: Diagnosis of  By: Buelah Manis MD, Lonell Grandchild    . GERD (gastroesophageal reflux disease)   . H/O hiatal hernia   . Hyperlipidemia   . Hypertension   . Left bundle branch block   . NEOPLASM, KIDNEY 03/04/2007   complex right renal cyst; Per patient- Urologist no longer following- no changes.      . Nonischemic cardiomyopathy ([place])   . Peripheral neuropathy   . Pneumonia   . Shortness of breath    Hx: of  with exertion  . Venous insufficiency 02/24/2011    Patient Active Problem List   Diagnosis Date Noted  . Vasovagal syncope 10/04/2018  . Normocytic anemia 02/23/2017  . High risk medication use 02/23/2017  . Hearing impairment 12/26/2016  . Impaired mobility and ADLs 12/26/2016  . Urinary incontinence in female 12/26/2016  . Cerebellar infarct ([place]) 12/26/2016  . Falls frequently 12/26/2016  . Visual impairment in both eyes   . Ovarian cyst 10/28/2016  . DNR (do not resuscitate) 08/22/2016  . Dementia, vascular, mixed, with behavioral disturbance ([place]) 01/31/2016  . Osteoporosis 09/11/2014  . History of anemia 11/10/2011  . Atrial fibrillation ([place]) 02/06/2011  . CKD stage G3b/A2, GFR 30-44 and albumin creatinine ratio 30-299 mg/g 01/26/2007  . Type 2 diabetes mellitus ([place]) 08/27/2006  . HLD (hyperlipidemia) 08/27/2006  . Essential hypertension 08/27/2006  . OSTEOARTHRITIS, MULTI SITES 08/27/2006    Past Surgical History:  Procedure Laterality Date  . ABDOMINAL HYSTERECTOMY    . CARDIAC CATHETERIZATION  08/12/2011   heavily calcified L main; LAD with no stenosis; 1st diagonal free of stenosis; L Cfx with no stenosis; OM1/2 without stenosis; PDA without stenosis; RCA without significant stenosis; non-ischemic cardiomyopathy (Dr. Kathy Breach)  . CATARACT EXTRACTION W/ INTRAOCULAR LENS  IMPLANT, BILATERAL    .  CHOLECYSTECTOMY  1987  . DILATION AND CURETTAGE OF UTERUS    . HIP ARTHROPLASTY Right 03/08/2017   Procedure: ARTHROPLASTY BIPOLAR HIP (HEMIARTHROPLASTY);  Surgeon: Paralee Cancel, MD;  Location: [facility] ORS;  Service: Orthopedics;  Laterality: Right;  . LEFT HEART CATHETERIZATION WITH CORONARY ANGIOGRAM N/A 08/12/2011   Procedure: LEFT HEART CATHETERIZATION WITH CORONARY ANGIOGRAM;  Surgeon: Pixie Casino, MD;  Location: [facility] CATH LAB;  Service: Cardiovascular;  Laterality: N/A;  . LUMBAR LAMINECTOMY/DECOMPRESSION MICRODISCECTOMY Left 05/12/2013   Procedure: Left Lumbar  four-five laminotomy and microdiskectomy;  Surgeon: Hosie Spangle, MD;  Location: [facility] NEURO ORS;  Service: Neurosurgery;  Laterality: Left;  Left Lumbar four-five laminotomy and microdiskectomy  . NM MYOCAR PERF WALL MOTION  05/2011   lexiscan myoview - fixed septal defect r/t LBBB; normal pattern of perfusion in all regions; low risk scan  . TONSILLECTOMY    . TRANSTHORACIC ECHOCARDIOGRAM  09/2011   EF 60-65%; MV mod-severely calcified, mildly thickened MV annulus, mild MR; LA mod dilated; RA mildly dilated     OB History   No obstetric history on file.      Home Medications    Prior to Admission medications   Medication Sig Start Date End Date Taking? Authorizing Provider  apixaban (ELIQUIS) 2.5 MG TABS tablet Take 1 tablet (2.5 mg total) by mouth 2 (two) times daily. 08/11/18 08/06/19 Yes Anderson, Chelsey L, DO  carvedilol (COREG) 3.125 MG tablet Take 1 tablet (3.125 mg total) by mouth 2 (two) times daily with a meal. 12/17/18 06/15/19 Yes Anderson, Chelsey L, DO  QUEtiapine (SEROQUEL) 50 MG tablet Take 2 tablets (100 mg total) by mouth at bedtime. 08/11/18 08/06/19 Yes Anderson, Chelsey L, DO  vitamin B-12 (CYANOCOBALAMIN) 100 MCG tablet Take 1 tablet (100 mcg total) by mouth daily. Patient not taking: Reported on 04/16/2019 11/26/17   Tonette Bihari, MD  albuterol (PROVENTIL HFA;VENTOLIN HFA) 108 (90 Base) MCG/ACT inhaler Inhale 2 puffs into the lungs every 6 (six) hours as needed for wheezing or shortness of breath. 08/08/15 08/11/18  Rosemarie Ax, MD    Family History Family History  Problem Relation Age of Onset  . Heart disease Mother   . Arthritis Mother   . Drug abuse Daughter   . Early death Daughter     Social History Social History   Tobacco Use  . Smoking status: Passive Smoke Exposure - Never Smoker  . Smokeless tobacco: Never Used  Substance Use Topics  . Alcohol use: No  . Drug use: No     Allergies   Patient has no known allergies.   Review of  Systems Review of Systems  All other systems reviewed and are negative.    Physical Exam Updated Vital Signs BP (!) 169/89 (BP Location: Left Arm)   Pulse 65   Temp (!) 97.5 F (36.4 C) (Axillary)   Resp 16   SpO2 98%   Physical Exam Vitals signs and nursing note reviewed.  Constitutional:      Appearance: She is well-developed. She is not toxic-appearing or diaphoretic.     Comments: Elderly, frail  HENT:     Head: Normocephalic and atraumatic.     Right Ear: External ear normal.     Left Ear: External ear normal.  Eyes:     General: No scleral icterus.       Right eye: No discharge.        Left eye: No discharge.     Conjunctiva/sclera: Conjunctivae normal.  Neck:  Musculoskeletal: Neck supple.     Trachea: No tracheal deviation.  Cardiovascular:     Rate and Rhythm: Normal rate and regular rhythm.  Pulmonary:     Effort: Pulmonary effort is normal. No respiratory distress.     Breath sounds: Normal breath sounds. No stridor. No wheezing or rales.  Abdominal:     General: Bowel sounds are normal. There is no distension.     Palpations: Abdomen is soft.     Tenderness: There is no abdominal tenderness. There is no guarding or rebound.  Musculoskeletal:        General: No tenderness.  Skin:    General: Skin is warm and dry.     Findings: No rash.  Neurological:     Mental Status: She is alert.     GCS: GCS eye subscore is 4. GCS verbal subscore is 3. GCS motor subscore is 6.     Cranial Nerves: No cranial nerve deficit (no facial droop, extraocular movements intact,  ).     Sensory: No sensory deficit.     Motor: No abnormal muscle tone or seizure activity.     Coordination: Coordination normal.     Comments: Patient speech is jumbled, some of her speech is nonsensical i but intermittently does answer with appropriate responses, patient is able to lift both arms and legs off the bed, she does follow commands      ED Treatments / Results  Labs (all labs  ordered are listed, but only abnormal results are displayed) Labs Reviewed  COMPREHENSIVE METABOLIC PANEL - Abnormal; Notable for the following components:      Result Value   Glucose, Bld 128 (*)    Creatinine, Ser 1.63 (*)    Total Protein 6.4 (*)    AST 12 (*)    GFR calc non Af Amer 28 (*)    GFR calc Af Amer 32 (*)    All other components within normal limits  CBC - Abnormal; Notable for the following components:   RBC 3.61 (*)    MCV 103.6 (*)    All other components within normal limits  URINALYSIS, ROUTINE W REFLEX MICROSCOPIC - Abnormal; Notable for the following components:   Hgb urine dipstick SMALL (*)    All other components within normal limits  BLOOD GAS, ARTERIAL - Abnormal; Notable for the following components:   pCO2 arterial 48.4 (*)    pO2, Arterial 79.7 (*)    All other components within normal limits  CBG MONITORING, ED - Abnormal; Notable for the following components:   Glucose-Capillary 113 (*)    All other components within normal limits  SARS CORONAVIRUS 2 (TAT 6-24 HRS)  AMMONIA    EKG None  Radiology Ct Head Wo Contrast  Result Date: 04/16/2019 CLINICAL DATA:  Acute mental status change EXAM: CT HEAD WITHOUT CONTRAST TECHNIQUE: Contiguous axial images were obtained from the base of the skull through the vertex without intravenous contrast. COMPARISON:  February 07, 2019 FINDINGS: Brain: No subdural, epidural, or subarachnoid hemorrhage. Ventricular prominence is stable. More mild sulcal prominence is stable. The cerebellar infarct is stable. Brainstem and basal cisterns are normal. White matter changes are unchanged. No acute cortical ischemia or infarct. No mass effect or midline shift. Vascular: Calcified atherosclerosis in the intracranial carotids. Skull: Normal. Negative for fracture or focal lesion. Sinuses/Orbits: There is fluid in the left mastoid air cells without bony erosion. This is a stable finding. The right mastoid air cells and middle ears  are well  aerated. Paranasal sinuses are normal. Other: None. IMPRESSION: Chronic white matter changes.  No acute intracranial abnormalities. Electronically Signed   By: Dorise Bullion III M.D   On: 04/16/2019 12:35   Dg Chest Portable 1 View  Result Date: 04/16/2019 CLINICAL DATA:  Altered mental status, cough EXAM: PORTABLE CHEST 1 VIEW COMPARISON:  02/07/2019 FINDINGS: Borderline cardiomegaly. Stable prominent vascularity centrally. No focal pneumonia, collapse or consolidation. Negative for edema, effusion or pneumothorax. Aorta atherosclerotic. Degenerative changes of the spine and shoulders. Bones are osteopenic. IMPRESSION: Stable cardiomegaly without acute process. Aortic atherosclerosis Electronically Signed   By: Jerilynn Mages.  Shick M.D.   On: 04/16/2019 10:22    Procedures Procedures (including critical care time)  Medications Ordered in ED Medications  sodium chloride 0.9 % bolus 500 mL (500 mLs Intravenous New Bag/Given 04/16/19 1032)    Followed by  0.9 %  sodium chloride infusion (1,000 mLs Intravenous New Bag/Given 04/16/19 1035)  sodium chloride flush (NS) 0.9 % injection 3 mL (3 mLs Intravenous Given 04/16/19 1036)     Initial Impression / Assessment and Plan / ED Course  I have reviewed the triage vital signs and the nursing notes.  Pertinent labs & imaging results that were available during my care of the patient were reviewed by me and considered in my medical decision making (see chart for details).  Clinical Course as of Apr 15 1332  Sat Apr 16, 2019  1323 Additional information provided by husband.  Pt had an episode where she seemed weak, wasn't responding as she does normally.  Sx have all resolved.  Pt is back to her baseline.  He would like to take her home as soon as possible   [JK]    Clinical Course User Index [JK] Dorie Rank, MD     Laboratory tests reviewed.  Urinalysis does not suggest infection.  ABG without signs of acidosis or significant hypercarbia.   Ammonia level normal.  CBC is unremarkable.  Electrolyte panel shows stable renal insufficiency.  Head CT and chest x-ray are unremarkable.  Medical records indicate the patient had a similar episode back in August.  She was diagnosed with a urinary tract infection and discharged home.  Her ED work-up is reassuring today.  I am not seeing any signs of acute infection.  I do not see any focal deficits to suggest an acute stroke although it certainly is somewhat difficult considering the patient's baseline dementia.  Patient appears stable for discharge.  The husband is comfortable with this and would like to take her home.  Warning signs precautions discussed.  Final Clinical Impressions(s) / ED Diagnoses   Final diagnoses:  Transient alteration of awareness    ED Discharge Orders    None       Dorie Rank, MD 04/16/19 1333

## 2019-04-16 NOTE — ED Triage Notes (Signed)
Patient brought in by husband with complaints of altered mental status and cough. Reports that patient will not eat or drink x2 days.

## 2019-04-16 NOTE — Discharge Instructions (Addendum)
Continue your current medications.  Follow-up with your primary care doctor.  Return to the emergency room for any recurrent or concerning episodes.

## 2019-04-17 DIAGNOSIS — I639 Cerebral infarction, unspecified: Secondary | ICD-10-CM | POA: Diagnosis not present

## 2019-04-17 DIAGNOSIS — I4891 Unspecified atrial fibrillation: Secondary | ICD-10-CM | POA: Diagnosis not present

## 2019-04-18 DIAGNOSIS — I4891 Unspecified atrial fibrillation: Secondary | ICD-10-CM | POA: Diagnosis not present

## 2019-04-18 DIAGNOSIS — I639 Cerebral infarction, unspecified: Secondary | ICD-10-CM | POA: Diagnosis not present

## 2019-04-18 NOTE — Telephone Encounter (Signed)
[name], Thank you for setting this up but I want to see her in my afternoon clinic for my "top" clinic with Dr. Erin Hearing. This is the time I have designated for extra time per appointment with my complex patients. Can you please attempt to reschedule her for the afternoon of Oct 26th.

## 2019-04-19 DIAGNOSIS — I4891 Unspecified atrial fibrillation: Secondary | ICD-10-CM | POA: Diagnosis not present

## 2019-04-19 DIAGNOSIS — I639 Cerebral infarction, unspecified: Secondary | ICD-10-CM | POA: Diagnosis not present

## 2019-04-20 DIAGNOSIS — I4891 Unspecified atrial fibrillation: Secondary | ICD-10-CM | POA: Diagnosis not present

## 2019-04-20 DIAGNOSIS — I639 Cerebral infarction, unspecified: Secondary | ICD-10-CM | POA: Diagnosis not present

## 2019-04-21 ENCOUNTER — Ambulatory Visit: Payer: Medicare Other | Admitting: Student in an Organized Health Care Education/Training Program

## 2019-04-21 ENCOUNTER — Telehealth: Payer: Self-pay | Admitting: *Deleted

## 2019-04-21 DIAGNOSIS — I4891 Unspecified atrial fibrillation: Secondary | ICD-10-CM | POA: Diagnosis not present

## 2019-04-21 DIAGNOSIS — I639 Cerebral infarction, unspecified: Secondary | ICD-10-CM | POA: Diagnosis not present

## 2019-04-21 NOTE — Telephone Encounter (Signed)
-----   Message from Richarda Osmond, DO sent at 04/20/2019  9:58 PM EDT ----- Regarding: clinic appointment reschedule [facility] team, please contact this patient to let her/her husband know that her appointment has been moved to Monday Oct. 26th at 3:00pm to allow [place] extra time to discuss her health. Her appointment on the 22nd should be cancelled. Thanks, chels

## 2019-04-21 NOTE — Telephone Encounter (Signed)
Pt husband informed. Mekenna Finau Kennon Holter, CMA

## 2019-04-22 DIAGNOSIS — I639 Cerebral infarction, unspecified: Secondary | ICD-10-CM | POA: Diagnosis not present

## 2019-04-22 DIAGNOSIS — I4891 Unspecified atrial fibrillation: Secondary | ICD-10-CM | POA: Diagnosis not present

## 2019-04-23 DIAGNOSIS — I639 Cerebral infarction, unspecified: Secondary | ICD-10-CM | POA: Diagnosis not present

## 2019-04-23 DIAGNOSIS — I4891 Unspecified atrial fibrillation: Secondary | ICD-10-CM | POA: Diagnosis not present

## 2019-04-24 DIAGNOSIS — I639 Cerebral infarction, unspecified: Secondary | ICD-10-CM | POA: Diagnosis not present

## 2019-04-24 DIAGNOSIS — I4891 Unspecified atrial fibrillation: Secondary | ICD-10-CM | POA: Diagnosis not present

## 2019-04-25 ENCOUNTER — Other Ambulatory Visit: Payer: Self-pay

## 2019-04-25 ENCOUNTER — Ambulatory Visit (INDEPENDENT_AMBULATORY_CARE_PROVIDER_SITE_OTHER): Payer: Medicare Other | Admitting: Student in an Organized Health Care Education/Training Program

## 2019-04-25 DIAGNOSIS — F0151 Vascular dementia with behavioral disturbance: Secondary | ICD-10-CM

## 2019-04-25 DIAGNOSIS — F01518 Vascular dementia, unspecified severity, with other behavioral disturbance: Secondary | ICD-10-CM

## 2019-04-25 DIAGNOSIS — I4891 Unspecified atrial fibrillation: Secondary | ICD-10-CM | POA: Diagnosis not present

## 2019-04-25 DIAGNOSIS — Z7189 Other specified counseling: Secondary | ICD-10-CM | POA: Diagnosis not present

## 2019-04-25 DIAGNOSIS — I639 Cerebral infarction, unspecified: Secondary | ICD-10-CM | POA: Diagnosis not present

## 2019-04-25 MED ORDER — ALBUTEROL SULFATE HFA 108 (90 BASE) MCG/ACT IN AERS: 2.0000 | INHALATION_SPRAY | Freq: Four times a day (QID) | RESPIRATORY_TRACT | 0 refills | Status: AC | PRN

## 2019-04-25 NOTE — Assessment & Plan Note (Signed)
Continue Eliquis as per discussion and decision by caregiver and power of attorney.  Heart rate 76 today.

## 2019-04-25 NOTE — Patient Instructions (Signed)
It was a pleasure to see you today!  To summarize our discussion for this visit:  We have discussed [name]'s medications.  I will refill her albuterol inhaler she can use it when she seems that she needs it  Continue taking the vitamin D  Continue to use the Seroquel at night as needed  Continue to take the carvedilol 3.125 mg twice a day  About her Eliquis, or blood thinner, we discussed the possibility of discontinuing this medication.  There is more conversation that needs to happen on making that decision that includes her son.  We discussed that being on blood thinner medications can increase her risk of having bleeds which would be dangerous for her in the event of a fall.  The medication helps prevent blood clots that can lead to stroke but you may want to consider discontinuing the medication because of the risks it poses for her and the fact that it will maybe prolong her life and her dementia will continue to get worse and worse.  Please continue to have discussions with her son about what decisions would like to make for her medications going forward.  Some additional health maintenance measures we should update are: Health Maintenance Due  Topic Date Due  . FOOT EXAM  08/14/2018  . HEMOGLOBIN A1C  08/28/2018  . OPHTHALMOLOGY EXAM  12/19/2018  . INFLUENZA VACCINE  01/29/2019  .    Please return to our clinic to see me in about 1 month.  Call the clinic at [phone] if your symptoms worsen or you have any concerns.   Thank you for allowing me to take part in your care,  Dr. Doristine Mango   Understanding Your Risk for Falls Each year, millions of people suffer serious injuries from falls. It is important to understand your risk for falling. Talk with your health care provider about your risk and what you can do to lower it. There are actions you can take at home to lower your risk. If you do have a serious fall, it is important to tell your health care  provider. Falling once raises your risk for falling again. How can falls affect me? Serious injuries from falls are common. These include:  Broken bones. Most hip fractures are caused by falls.  Traumatic brain injury (TBI). Falls are the most common cause of TBI. Fear of falling can also cause you to avoid activities and stay at home. This can make your muscles weaker and actually raise your risk for a fall. What can increase my risk? Serious injuries from a fall most often happen to people older than age 69. Children and young adults ages 32-29 are also at higher risk. The more risk factors you have for falling, the higher your risk. Risk factors include:  Weakness in the lower body.  Lack (deficiency) of vitamin D.  Weak bones (osteoporosis).  Being generally weak or confused due to long-term (chronic) illness.  Dizziness or balance problems.  Poor vision.  Having depression.  Medicine that causes dizziness or drowsiness. These can include medicines for your blood pressure, heart, anxiety, insomnia, or edema, as well as pain medicines and muscle relaxants.  Drinking alcohol.  Foot pain or improper footwear.  Working at a dangerous job.  Having had a fall in the past.  Tripping hazards at home, such as floor clutter or loose rugs, or poor lighting.  Having pets or clutter in your home. What actions can I take to lower my risk of falling?  Maintain physical fitness: ? Do strength and balance exercises. Consider taking a regular class to build strength and balance. Yoga and tai chi are good options. ? Have your eyes checked every year and your vision prescription updated as needed.  Remove all clutter from walkways and stairways, including extension cords.  Use a cordless phone.  Do not use throw rugs. Make sure all carpeting is taped or tacked down securely.  Use good lighting in all rooms. Keep a flashlight near your bed.  Make sure there is a clear path  from your bed to the bathroom. Use night-lights.  Install grab bars for your tub, shower, and toilet. Use a bath mat in your tub or shower.  Attach secure railings on both sides of your stairs.  Repair uneven or broken steps.  Use a cane or walker as directed by your health care provider.  Wear nonskid shoes. Do not wear high heels. Do not walk around the house in socks or slippers.  Avoid walking on icy or slippery surfaces. Walk on the grass instead of on icy or slick sidewalks. Where you can, use ice melt to get rid of ice on walkways. Questions to ask your health care provider  Can you help me evaluate my risk for a fall?  Do any of my medicines make me more likely to fall?  Should I take a vitamin D supplement?  What exercises can I do to improve my strength and balance?  Should I make an appointment to have my vision checked?  Do I need a bone density test to check for osteoporosis?  Would it help to use a cane or a walker? Where to find more information  Centers for Disease Control and Prevention, STEADI: StoreMirror.com.cy  Community-Based Fall Prevention Programs: StoreMirror.com.cy  [employer] on Aging: ToneConnect.com.ee Contact a health care provider if:  You fall at home.  You are afraid of falling at home.  You feel weak, drowsy, or dizzy at home. Summary  People 33 and older are at high risk for falling. However, older people are not the only ones injured in falls. Children and young adults have a higher-than-normal risk, too.  Talk with your health care provider about your risks for falling and how to lower those risks.  Taking certain precautions at home can lower your risk for falling.  If you fall, always tell your health care provider. This information is not intended to replace advice given to you by your health care provider. Make sure you discuss any questions you have with your health care provider. Document Released: 01/28/2017 Document Revised:  09/15/2017 Document Reviewed: 01/28/2017 Elsevier Patient Education  2020 [employer].

## 2019-04-25 NOTE — Progress Notes (Signed)
Subjective:    Patient ID: REIANNA BERGSTRESSER, female    DOB: 1927/12/13, 85 y.o.   MRN: [id number]  CC: Checkup  HPI:  85 year old female with advanced dementia presenting today for complex visit.  Dementia-patient's history provided by husband who is full-time caregiver at home.  He states that things are going well and does not have complaints.  He uses Seroquel at night which provides her with some mood modification that assists the patient husband and being able to get her to sleep at night.  Husband is not interested in any type of long-term placement for patient at this time.  Anticoagulation-patient remains on Eliquis and is adherent with this medication.  Has been use pillbox to sort his and his wife's medications.  He denies any falls.  Recent illness-patient states that the patient was not acting like herself and seemed like she was "out of it" in the shower after taking her blood pressure medications on Saturday.  Since our clinic was closed he brought her to the emergency department where they ran several labs and did a head CT which was unrevealing.  They recommended she decrease her blood pressure medication Coreg to 3.125 mg twice daily and it appears that she is well controlled today.  Blood pressure 126/75.  Husband states that the patient is back to her baseline self at this time.  Goals of care-had discussion with patient's husband on goals of care given that patient is 58 with severely advanced dementia and on Eliquis which could pose a bleeding risk and could be considered prolonging while patient is DNR.  Patient continues on vitamin D.  Husband endorses that he himself is the healthcare power of attorney and that he has the son's input for decisions for her health.  Smoking status reviewed   ROS: pertinent noted in the HPI   Past Medical History:  Diagnosis Date  . A-fib ([place])   . Anemia    anemia of renal disease  . Arthritis    mutiple joints  . Cancer [facility])    Renal neoplasm- survellience via Urology  . CHF (congestive heart failure) ([place])    Diastolic dys-- EF 123456 july 2011  . Chronic kidney disease    Stage III- followed by [facility]  . COPD (chronic obstructive pulmonary disease) ([place])   . Dementia, vascular, mixed, with behavioral disturbance ([place]) 01/31/2016  . Diabetes mellitus   . DIASTOLIC DYSFUNCTION 99991111   Qualifier: Diagnosis of  By: Buelah Manis MD, Lonell Grandchild    . GERD (gastroesophageal reflux disease)   . H/O hiatal hernia   . Hyperlipidemia   . Hypertension   . Left bundle branch block   . NEOPLASM, KIDNEY 03/04/2007   complex right renal cyst; Per patient- Urologist no longer following- no changes.      . Nonischemic cardiomyopathy ([place])   . Peripheral neuropathy   . Pneumonia   . Shortness of breath    Hx: of with exertion  . Venous insufficiency 02/24/2011    Past Surgical History:  Procedure Laterality Date  . ABDOMINAL HYSTERECTOMY    . CARDIAC CATHETERIZATION  08/12/2011   heavily calcified L main; LAD with no stenosis; 1st diagonal free of stenosis; L Cfx with no stenosis; OM1/2 without stenosis; PDA without stenosis; RCA without significant stenosis; non-ischemic cardiomyopathy (Dr. Kathy Breach)  . CATARACT EXTRACTION W/ INTRAOCULAR LENS  IMPLANT, BILATERAL    . CHOLECYSTECTOMY  1987  . DILATION AND CURETTAGE OF UTERUS    . HIP ARTHROPLASTY  Right 03/08/2017   Procedure: ARTHROPLASTY BIPOLAR HIP (HEMIARTHROPLASTY);  Surgeon: Paralee Cancel, MD;  Location: [facility] ORS;  Service: Orthopedics;  Laterality: Right;  . LEFT HEART CATHETERIZATION WITH CORONARY ANGIOGRAM N/A 08/12/2011   Procedure: LEFT HEART CATHETERIZATION WITH CORONARY ANGIOGRAM;  Surgeon: Pixie Casino, MD;  Location: [facility] CATH LAB;  Service: Cardiovascular;  Laterality: N/A;  . LUMBAR LAMINECTOMY/DECOMPRESSION MICRODISCECTOMY Left 05/12/2013   Procedure: Left Lumbar four-five laminotomy and microdiskectomy;  Surgeon: Hosie Spangle, MD;  Location: [place] NEURO  ORS;  Service: Neurosurgery;  Laterality: Left;  Left Lumbar four-five laminotomy and microdiskectomy  . NM MYOCAR PERF WALL MOTION  05/2011   lexiscan myoview - fixed septal defect r/t LBBB; normal pattern of perfusion in all regions; low risk scan  . TONSILLECTOMY    . TRANSTHORACIC ECHOCARDIOGRAM  09/2011   EF 60-65%; MV mod-severely calcified, mildly thickened MV annulus, mild MR; LA mod dilated; RA mildly dilated   I have personally reviewed pertinent past medical history, surgical, family, and social history as appropriate.  Objective:  BP 126/75   Pulse 76   Wt 130 lb 9.6 oz (59.2 kg)   SpO2 98%   BMI 20.45 kg/m   Vitals and nursing note reviewed  General: NAD, pleasant, able to make good eye contact when speaking to her Cardiac: Very difficult to auscultate but no murmur was appreciated.  Good capillary refill and no lower extremity edema or fluid on the lungs Respiratory: CTAB, normal effort, No wheezes, rales or rhonchi Extremities: no edema or cyanosis. Skin: warm and dry, no rashes noted Neuro: alert, no obvious focal deficits, not oriented to self, time or location Psych: Flat affect and mood  Assessment & Plan:    Dementia, vascular, mixed, with behavioral disturbance Appears to be worsened in clinic but caregiver expresses that she has at baseline and has good days and bad days. -Continue Seroquel  Atrial fibrillation ([place]) Continue Eliquis as per discussion and decision by caregiver and power of attorney.  Heart rate 76 today.  Counseling regarding advanced care planning and goals of care Patient has power of attorney who is husband. -Discussed goals today on end-of-life care and caregiver is satisfied with caring for patient at home at this time and declines any further discussion about long-term placement or assistance with care at this time. -Spoke with son on the phone who is also involved in making the healthcare decisions. -Reapproach this topic at next  appointment.   Meds ordered this encounter  Medications  . albuterol (VENTOLIN HFA) 108 (90 Base) MCG/ACT inhaler    Sig: Inhale 2 puffs into the lungs every 6 (six) hours as needed for wheezing or shortness of breath.    Dispense:  8 g    Refill:  0    Doristine Mango, [place] PGY-2

## 2019-04-25 NOTE — Assessment & Plan Note (Signed)
Appears to be worsened in clinic but caregiver expresses that she has at baseline and has good days and bad days. -Continue Seroquel

## 2019-04-25 NOTE — Assessment & Plan Note (Signed)
Patient has power of attorney who is husband. -Discussed goals today on end-of-life care and caregiver is satisfied with caring for patient at home at this time and declines any further discussion about long-term placement or assistance with care at this time. -Spoke with son on the phone who is also involved in making the healthcare decisions. -Reapproach this topic at next appointment.

## 2019-04-26 DIAGNOSIS — I639 Cerebral infarction, unspecified: Secondary | ICD-10-CM | POA: Diagnosis not present

## 2019-04-26 DIAGNOSIS — I4891 Unspecified atrial fibrillation: Secondary | ICD-10-CM | POA: Diagnosis not present

## 2019-04-27 DIAGNOSIS — I4891 Unspecified atrial fibrillation: Secondary | ICD-10-CM | POA: Diagnosis not present

## 2019-04-27 DIAGNOSIS — I639 Cerebral infarction, unspecified: Secondary | ICD-10-CM | POA: Diagnosis not present

## 2019-04-29 DIAGNOSIS — I639 Cerebral infarction, unspecified: Secondary | ICD-10-CM | POA: Diagnosis not present

## 2019-04-29 DIAGNOSIS — I4891 Unspecified atrial fibrillation: Secondary | ICD-10-CM | POA: Diagnosis not present

## 2019-04-30 DIAGNOSIS — I639 Cerebral infarction, unspecified: Secondary | ICD-10-CM | POA: Diagnosis not present

## 2019-04-30 DIAGNOSIS — I4891 Unspecified atrial fibrillation: Secondary | ICD-10-CM | POA: Diagnosis not present

## 2019-05-01 DIAGNOSIS — I639 Cerebral infarction, unspecified: Secondary | ICD-10-CM | POA: Diagnosis not present

## 2019-05-01 DIAGNOSIS — I4891 Unspecified atrial fibrillation: Secondary | ICD-10-CM | POA: Diagnosis not present

## 2019-05-02 DIAGNOSIS — I639 Cerebral infarction, unspecified: Secondary | ICD-10-CM | POA: Diagnosis not present

## 2019-05-02 DIAGNOSIS — I4891 Unspecified atrial fibrillation: Secondary | ICD-10-CM | POA: Diagnosis not present

## 2019-05-03 DIAGNOSIS — I4891 Unspecified atrial fibrillation: Secondary | ICD-10-CM | POA: Diagnosis not present

## 2019-05-03 DIAGNOSIS — I639 Cerebral infarction, unspecified: Secondary | ICD-10-CM | POA: Diagnosis not present

## 2019-05-04 DIAGNOSIS — I4891 Unspecified atrial fibrillation: Secondary | ICD-10-CM | POA: Diagnosis not present

## 2019-05-04 DIAGNOSIS — I639 Cerebral infarction, unspecified: Secondary | ICD-10-CM | POA: Diagnosis not present

## 2019-05-05 DIAGNOSIS — I639 Cerebral infarction, unspecified: Secondary | ICD-10-CM | POA: Diagnosis not present

## 2019-05-05 DIAGNOSIS — I4891 Unspecified atrial fibrillation: Secondary | ICD-10-CM | POA: Diagnosis not present

## 2019-05-06 DIAGNOSIS — I4891 Unspecified atrial fibrillation: Secondary | ICD-10-CM | POA: Diagnosis not present

## 2019-05-06 DIAGNOSIS — I639 Cerebral infarction, unspecified: Secondary | ICD-10-CM | POA: Diagnosis not present

## 2019-05-07 DIAGNOSIS — I639 Cerebral infarction, unspecified: Secondary | ICD-10-CM | POA: Diagnosis not present

## 2019-05-07 DIAGNOSIS — I4891 Unspecified atrial fibrillation: Secondary | ICD-10-CM | POA: Diagnosis not present

## 2019-05-08 DIAGNOSIS — I4891 Unspecified atrial fibrillation: Secondary | ICD-10-CM | POA: Diagnosis not present

## 2019-05-08 DIAGNOSIS — I639 Cerebral infarction, unspecified: Secondary | ICD-10-CM | POA: Diagnosis not present

## 2019-05-09 DIAGNOSIS — I4891 Unspecified atrial fibrillation: Secondary | ICD-10-CM | POA: Diagnosis not present

## 2019-05-09 DIAGNOSIS — I639 Cerebral infarction, unspecified: Secondary | ICD-10-CM | POA: Diagnosis not present

## 2019-05-10 DIAGNOSIS — I4891 Unspecified atrial fibrillation: Secondary | ICD-10-CM | POA: Diagnosis not present

## 2019-05-10 DIAGNOSIS — I639 Cerebral infarction, unspecified: Secondary | ICD-10-CM | POA: Diagnosis not present

## 2019-05-11 DIAGNOSIS — I4891 Unspecified atrial fibrillation: Secondary | ICD-10-CM | POA: Diagnosis not present

## 2019-05-11 DIAGNOSIS — M353 Polymyalgia rheumatica: Secondary | ICD-10-CM | POA: Diagnosis not present

## 2019-05-11 DIAGNOSIS — I639 Cerebral infarction, unspecified: Secondary | ICD-10-CM | POA: Diagnosis not present

## 2019-05-11 DIAGNOSIS — S329XXA Fracture of unspecified parts of lumbosacral spine and pelvis, initial encounter for closed fracture: Secondary | ICD-10-CM | POA: Diagnosis not present

## 2019-05-12 DIAGNOSIS — I639 Cerebral infarction, unspecified: Secondary | ICD-10-CM | POA: Diagnosis not present

## 2019-05-12 DIAGNOSIS — I4891 Unspecified atrial fibrillation: Secondary | ICD-10-CM | POA: Diagnosis not present

## 2019-05-13 DIAGNOSIS — I639 Cerebral infarction, unspecified: Secondary | ICD-10-CM | POA: Diagnosis not present

## 2019-05-13 DIAGNOSIS — I4891 Unspecified atrial fibrillation: Secondary | ICD-10-CM | POA: Diagnosis not present

## 2019-05-14 DIAGNOSIS — I639 Cerebral infarction, unspecified: Secondary | ICD-10-CM | POA: Diagnosis not present

## 2019-05-14 DIAGNOSIS — I4891 Unspecified atrial fibrillation: Secondary | ICD-10-CM | POA: Diagnosis not present

## 2019-05-15 DIAGNOSIS — I4891 Unspecified atrial fibrillation: Secondary | ICD-10-CM | POA: Diagnosis not present

## 2019-05-15 DIAGNOSIS — I639 Cerebral infarction, unspecified: Secondary | ICD-10-CM | POA: Diagnosis not present

## 2019-05-16 DIAGNOSIS — I639 Cerebral infarction, unspecified: Secondary | ICD-10-CM | POA: Diagnosis not present

## 2019-05-16 DIAGNOSIS — I4891 Unspecified atrial fibrillation: Secondary | ICD-10-CM | POA: Diagnosis not present

## 2019-05-17 DIAGNOSIS — I4891 Unspecified atrial fibrillation: Secondary | ICD-10-CM | POA: Diagnosis not present

## 2019-05-17 DIAGNOSIS — I639 Cerebral infarction, unspecified: Secondary | ICD-10-CM | POA: Diagnosis not present

## 2019-05-18 DIAGNOSIS — I639 Cerebral infarction, unspecified: Secondary | ICD-10-CM | POA: Diagnosis not present

## 2019-05-18 DIAGNOSIS — I4891 Unspecified atrial fibrillation: Secondary | ICD-10-CM | POA: Diagnosis not present

## 2019-05-19 DIAGNOSIS — I639 Cerebral infarction, unspecified: Secondary | ICD-10-CM | POA: Diagnosis not present

## 2019-05-19 DIAGNOSIS — I4891 Unspecified atrial fibrillation: Secondary | ICD-10-CM | POA: Diagnosis not present

## 2019-05-20 DIAGNOSIS — I639 Cerebral infarction, unspecified: Secondary | ICD-10-CM | POA: Diagnosis not present

## 2019-05-20 DIAGNOSIS — I4891 Unspecified atrial fibrillation: Secondary | ICD-10-CM | POA: Diagnosis not present

## 2019-05-21 DIAGNOSIS — I4891 Unspecified atrial fibrillation: Secondary | ICD-10-CM | POA: Diagnosis not present

## 2019-05-21 DIAGNOSIS — I639 Cerebral infarction, unspecified: Secondary | ICD-10-CM | POA: Diagnosis not present

## 2019-05-22 DIAGNOSIS — I639 Cerebral infarction, unspecified: Secondary | ICD-10-CM | POA: Diagnosis not present

## 2019-05-22 DIAGNOSIS — I4891 Unspecified atrial fibrillation: Secondary | ICD-10-CM | POA: Diagnosis not present

## 2019-05-23 DIAGNOSIS — I639 Cerebral infarction, unspecified: Secondary | ICD-10-CM | POA: Diagnosis not present

## 2019-05-23 DIAGNOSIS — I4891 Unspecified atrial fibrillation: Secondary | ICD-10-CM | POA: Diagnosis not present

## 2019-05-24 DIAGNOSIS — I4891 Unspecified atrial fibrillation: Secondary | ICD-10-CM | POA: Diagnosis not present

## 2019-05-24 DIAGNOSIS — I639 Cerebral infarction, unspecified: Secondary | ICD-10-CM | POA: Diagnosis not present

## 2019-05-25 DIAGNOSIS — I639 Cerebral infarction, unspecified: Secondary | ICD-10-CM | POA: Diagnosis not present

## 2019-05-25 DIAGNOSIS — I4891 Unspecified atrial fibrillation: Secondary | ICD-10-CM | POA: Diagnosis not present

## 2019-05-26 DIAGNOSIS — H5213 Myopia, bilateral: Secondary | ICD-10-CM | POA: Diagnosis not present

## 2019-05-26 DIAGNOSIS — I639 Cerebral infarction, unspecified: Secondary | ICD-10-CM | POA: Diagnosis not present

## 2019-05-26 DIAGNOSIS — I4891 Unspecified atrial fibrillation: Secondary | ICD-10-CM | POA: Diagnosis not present

## 2019-05-27 DIAGNOSIS — I639 Cerebral infarction, unspecified: Secondary | ICD-10-CM | POA: Diagnosis not present

## 2019-05-27 DIAGNOSIS — I4891 Unspecified atrial fibrillation: Secondary | ICD-10-CM | POA: Diagnosis not present

## 2019-05-28 DIAGNOSIS — I639 Cerebral infarction, unspecified: Secondary | ICD-10-CM | POA: Diagnosis not present

## 2019-05-28 DIAGNOSIS — I4891 Unspecified atrial fibrillation: Secondary | ICD-10-CM | POA: Diagnosis not present

## 2019-05-30 DIAGNOSIS — I639 Cerebral infarction, unspecified: Secondary | ICD-10-CM | POA: Diagnosis not present

## 2019-05-30 DIAGNOSIS — I4891 Unspecified atrial fibrillation: Secondary | ICD-10-CM | POA: Diagnosis not present

## 2019-05-31 DIAGNOSIS — I639 Cerebral infarction, unspecified: Secondary | ICD-10-CM | POA: Diagnosis not present

## 2019-05-31 DIAGNOSIS — I4891 Unspecified atrial fibrillation: Secondary | ICD-10-CM | POA: Diagnosis not present

## 2019-06-01 DIAGNOSIS — I4891 Unspecified atrial fibrillation: Secondary | ICD-10-CM | POA: Diagnosis not present

## 2019-06-01 DIAGNOSIS — I639 Cerebral infarction, unspecified: Secondary | ICD-10-CM | POA: Diagnosis not present

## 2019-06-02 DIAGNOSIS — I639 Cerebral infarction, unspecified: Secondary | ICD-10-CM | POA: Diagnosis not present

## 2019-06-02 DIAGNOSIS — I4891 Unspecified atrial fibrillation: Secondary | ICD-10-CM | POA: Diagnosis not present

## 2019-06-03 DIAGNOSIS — I4891 Unspecified atrial fibrillation: Secondary | ICD-10-CM | POA: Diagnosis not present

## 2019-06-03 DIAGNOSIS — I639 Cerebral infarction, unspecified: Secondary | ICD-10-CM | POA: Diagnosis not present

## 2019-06-04 DIAGNOSIS — I639 Cerebral infarction, unspecified: Secondary | ICD-10-CM | POA: Diagnosis not present

## 2019-06-04 DIAGNOSIS — I4891 Unspecified atrial fibrillation: Secondary | ICD-10-CM | POA: Diagnosis not present

## 2019-06-05 DIAGNOSIS — I4891 Unspecified atrial fibrillation: Secondary | ICD-10-CM | POA: Diagnosis not present

## 2019-06-05 DIAGNOSIS — I639 Cerebral infarction, unspecified: Secondary | ICD-10-CM | POA: Diagnosis not present

## 2019-06-07 DIAGNOSIS — I4891 Unspecified atrial fibrillation: Secondary | ICD-10-CM | POA: Diagnosis not present

## 2019-06-07 DIAGNOSIS — I639 Cerebral infarction, unspecified: Secondary | ICD-10-CM | POA: Diagnosis not present

## 2019-06-08 DIAGNOSIS — I4891 Unspecified atrial fibrillation: Secondary | ICD-10-CM | POA: Diagnosis not present

## 2019-06-08 DIAGNOSIS — I639 Cerebral infarction, unspecified: Secondary | ICD-10-CM | POA: Diagnosis not present

## 2019-06-09 DIAGNOSIS — I639 Cerebral infarction, unspecified: Secondary | ICD-10-CM | POA: Diagnosis not present

## 2019-06-09 DIAGNOSIS — I4891 Unspecified atrial fibrillation: Secondary | ICD-10-CM | POA: Diagnosis not present

## 2019-06-10 DIAGNOSIS — I639 Cerebral infarction, unspecified: Secondary | ICD-10-CM | POA: Diagnosis not present

## 2019-06-10 DIAGNOSIS — I4891 Unspecified atrial fibrillation: Secondary | ICD-10-CM | POA: Diagnosis not present

## 2019-06-11 DIAGNOSIS — I4891 Unspecified atrial fibrillation: Secondary | ICD-10-CM | POA: Diagnosis not present

## 2019-06-11 DIAGNOSIS — I639 Cerebral infarction, unspecified: Secondary | ICD-10-CM | POA: Diagnosis not present

## 2019-06-12 DIAGNOSIS — I639 Cerebral infarction, unspecified: Secondary | ICD-10-CM | POA: Diagnosis not present

## 2019-06-12 DIAGNOSIS — I4891 Unspecified atrial fibrillation: Secondary | ICD-10-CM | POA: Diagnosis not present

## 2019-06-13 DIAGNOSIS — I639 Cerebral infarction, unspecified: Secondary | ICD-10-CM | POA: Diagnosis not present

## 2019-06-13 DIAGNOSIS — I4891 Unspecified atrial fibrillation: Secondary | ICD-10-CM | POA: Diagnosis not present

## 2019-06-14 DIAGNOSIS — I4891 Unspecified atrial fibrillation: Secondary | ICD-10-CM | POA: Diagnosis not present

## 2019-06-14 DIAGNOSIS — I639 Cerebral infarction, unspecified: Secondary | ICD-10-CM | POA: Diagnosis not present

## 2019-06-15 DIAGNOSIS — I639 Cerebral infarction, unspecified: Secondary | ICD-10-CM | POA: Diagnosis not present

## 2019-06-15 DIAGNOSIS — I4891 Unspecified atrial fibrillation: Secondary | ICD-10-CM | POA: Diagnosis not present

## 2019-06-16 DIAGNOSIS — I4891 Unspecified atrial fibrillation: Secondary | ICD-10-CM | POA: Diagnosis not present

## 2019-06-16 DIAGNOSIS — I639 Cerebral infarction, unspecified: Secondary | ICD-10-CM | POA: Diagnosis not present

## 2019-06-17 DIAGNOSIS — I4891 Unspecified atrial fibrillation: Secondary | ICD-10-CM | POA: Diagnosis not present

## 2019-06-17 DIAGNOSIS — I639 Cerebral infarction, unspecified: Secondary | ICD-10-CM | POA: Diagnosis not present

## 2019-06-18 DIAGNOSIS — I639 Cerebral infarction, unspecified: Secondary | ICD-10-CM | POA: Diagnosis not present

## 2019-06-18 DIAGNOSIS — I4891 Unspecified atrial fibrillation: Secondary | ICD-10-CM | POA: Diagnosis not present

## 2019-06-19 DIAGNOSIS — I4891 Unspecified atrial fibrillation: Secondary | ICD-10-CM | POA: Diagnosis not present

## 2019-06-19 DIAGNOSIS — I639 Cerebral infarction, unspecified: Secondary | ICD-10-CM | POA: Diagnosis not present

## 2019-06-20 DIAGNOSIS — I639 Cerebral infarction, unspecified: Secondary | ICD-10-CM | POA: Diagnosis not present

## 2019-06-20 DIAGNOSIS — I4891 Unspecified atrial fibrillation: Secondary | ICD-10-CM | POA: Diagnosis not present

## 2019-06-21 DIAGNOSIS — I639 Cerebral infarction, unspecified: Secondary | ICD-10-CM | POA: Diagnosis not present

## 2019-06-21 DIAGNOSIS — I4891 Unspecified atrial fibrillation: Secondary | ICD-10-CM | POA: Diagnosis not present

## 2019-06-28 DIAGNOSIS — I4891 Unspecified atrial fibrillation: Secondary | ICD-10-CM | POA: Diagnosis not present

## 2019-06-28 DIAGNOSIS — I639 Cerebral infarction, unspecified: Secondary | ICD-10-CM | POA: Diagnosis not present

## 2019-06-29 DIAGNOSIS — I639 Cerebral infarction, unspecified: Secondary | ICD-10-CM | POA: Diagnosis not present

## 2019-06-29 DIAGNOSIS — I4891 Unspecified atrial fibrillation: Secondary | ICD-10-CM | POA: Diagnosis not present

## 2019-06-30 DIAGNOSIS — I4891 Unspecified atrial fibrillation: Secondary | ICD-10-CM | POA: Diagnosis not present

## 2019-06-30 DIAGNOSIS — I639 Cerebral infarction, unspecified: Secondary | ICD-10-CM | POA: Diagnosis not present

## 2019-07-02 DIAGNOSIS — I4891 Unspecified atrial fibrillation: Secondary | ICD-10-CM | POA: Diagnosis not present

## 2019-07-02 DIAGNOSIS — I639 Cerebral infarction, unspecified: Secondary | ICD-10-CM | POA: Diagnosis not present

## 2019-07-03 DIAGNOSIS — I639 Cerebral infarction, unspecified: Secondary | ICD-10-CM | POA: Diagnosis not present

## 2019-07-03 DIAGNOSIS — I4891 Unspecified atrial fibrillation: Secondary | ICD-10-CM | POA: Diagnosis not present

## 2019-07-05 DIAGNOSIS — I4891 Unspecified atrial fibrillation: Secondary | ICD-10-CM | POA: Diagnosis not present

## 2019-07-05 DIAGNOSIS — I639 Cerebral infarction, unspecified: Secondary | ICD-10-CM | POA: Diagnosis not present

## 2019-07-06 DIAGNOSIS — I639 Cerebral infarction, unspecified: Secondary | ICD-10-CM | POA: Diagnosis not present

## 2019-07-06 DIAGNOSIS — I4891 Unspecified atrial fibrillation: Secondary | ICD-10-CM | POA: Diagnosis not present

## 2019-07-07 DIAGNOSIS — I639 Cerebral infarction, unspecified: Secondary | ICD-10-CM | POA: Diagnosis not present

## 2019-07-07 DIAGNOSIS — I4891 Unspecified atrial fibrillation: Secondary | ICD-10-CM | POA: Diagnosis not present

## 2019-07-08 DIAGNOSIS — I639 Cerebral infarction, unspecified: Secondary | ICD-10-CM | POA: Diagnosis not present

## 2019-07-08 DIAGNOSIS — I4891 Unspecified atrial fibrillation: Secondary | ICD-10-CM | POA: Diagnosis not present

## 2019-07-09 DIAGNOSIS — I639 Cerebral infarction, unspecified: Secondary | ICD-10-CM | POA: Diagnosis not present

## 2019-07-09 DIAGNOSIS — I4891 Unspecified atrial fibrillation: Secondary | ICD-10-CM | POA: Diagnosis not present

## 2019-07-10 DIAGNOSIS — I4891 Unspecified atrial fibrillation: Secondary | ICD-10-CM | POA: Diagnosis not present

## 2019-07-10 DIAGNOSIS — I639 Cerebral infarction, unspecified: Secondary | ICD-10-CM | POA: Diagnosis not present

## 2019-07-11 DIAGNOSIS — I639 Cerebral infarction, unspecified: Secondary | ICD-10-CM | POA: Diagnosis not present

## 2019-07-11 DIAGNOSIS — I4891 Unspecified atrial fibrillation: Secondary | ICD-10-CM | POA: Diagnosis not present

## 2019-07-12 DIAGNOSIS — I639 Cerebral infarction, unspecified: Secondary | ICD-10-CM | POA: Diagnosis not present

## 2019-07-12 DIAGNOSIS — I4891 Unspecified atrial fibrillation: Secondary | ICD-10-CM | POA: Diagnosis not present

## 2019-07-13 DIAGNOSIS — I4891 Unspecified atrial fibrillation: Secondary | ICD-10-CM | POA: Diagnosis not present

## 2019-07-13 DIAGNOSIS — I639 Cerebral infarction, unspecified: Secondary | ICD-10-CM | POA: Diagnosis not present

## 2019-07-14 DIAGNOSIS — I639 Cerebral infarction, unspecified: Secondary | ICD-10-CM | POA: Diagnosis not present

## 2019-07-14 DIAGNOSIS — I4891 Unspecified atrial fibrillation: Secondary | ICD-10-CM | POA: Diagnosis not present

## 2019-07-15 DIAGNOSIS — I639 Cerebral infarction, unspecified: Secondary | ICD-10-CM | POA: Diagnosis not present

## 2019-07-15 DIAGNOSIS — I4891 Unspecified atrial fibrillation: Secondary | ICD-10-CM | POA: Diagnosis not present

## 2019-07-16 DIAGNOSIS — I639 Cerebral infarction, unspecified: Secondary | ICD-10-CM | POA: Diagnosis not present

## 2019-07-16 DIAGNOSIS — I4891 Unspecified atrial fibrillation: Secondary | ICD-10-CM | POA: Diagnosis not present

## 2019-07-17 DIAGNOSIS — I639 Cerebral infarction, unspecified: Secondary | ICD-10-CM | POA: Diagnosis not present

## 2019-07-17 DIAGNOSIS — I4891 Unspecified atrial fibrillation: Secondary | ICD-10-CM | POA: Diagnosis not present

## 2019-07-18 DIAGNOSIS — I639 Cerebral infarction, unspecified: Secondary | ICD-10-CM | POA: Diagnosis not present

## 2019-07-18 DIAGNOSIS — I4891 Unspecified atrial fibrillation: Secondary | ICD-10-CM | POA: Diagnosis not present

## 2019-07-19 DIAGNOSIS — I4891 Unspecified atrial fibrillation: Secondary | ICD-10-CM | POA: Diagnosis not present

## 2019-07-19 DIAGNOSIS — I639 Cerebral infarction, unspecified: Secondary | ICD-10-CM | POA: Diagnosis not present

## 2019-07-20 DIAGNOSIS — I639 Cerebral infarction, unspecified: Secondary | ICD-10-CM | POA: Diagnosis not present

## 2019-07-20 DIAGNOSIS — I4891 Unspecified atrial fibrillation: Secondary | ICD-10-CM | POA: Diagnosis not present

## 2019-07-21 DIAGNOSIS — I639 Cerebral infarction, unspecified: Secondary | ICD-10-CM | POA: Diagnosis not present

## 2019-07-21 DIAGNOSIS — I4891 Unspecified atrial fibrillation: Secondary | ICD-10-CM | POA: Diagnosis not present

## 2019-07-22 DIAGNOSIS — I639 Cerebral infarction, unspecified: Secondary | ICD-10-CM | POA: Diagnosis not present

## 2019-07-22 DIAGNOSIS — I4891 Unspecified atrial fibrillation: Secondary | ICD-10-CM | POA: Diagnosis not present

## 2019-07-23 DIAGNOSIS — I4891 Unspecified atrial fibrillation: Secondary | ICD-10-CM | POA: Diagnosis not present

## 2019-07-23 DIAGNOSIS — I639 Cerebral infarction, unspecified: Secondary | ICD-10-CM | POA: Diagnosis not present

## 2019-07-24 DIAGNOSIS — I4891 Unspecified atrial fibrillation: Secondary | ICD-10-CM | POA: Diagnosis not present

## 2019-07-24 DIAGNOSIS — I639 Cerebral infarction, unspecified: Secondary | ICD-10-CM | POA: Diagnosis not present

## 2019-07-25 DIAGNOSIS — I639 Cerebral infarction, unspecified: Secondary | ICD-10-CM | POA: Diagnosis not present

## 2019-07-25 DIAGNOSIS — I4891 Unspecified atrial fibrillation: Secondary | ICD-10-CM | POA: Diagnosis not present

## 2019-07-26 DIAGNOSIS — I4891 Unspecified atrial fibrillation: Secondary | ICD-10-CM | POA: Diagnosis not present

## 2019-07-26 DIAGNOSIS — I639 Cerebral infarction, unspecified: Secondary | ICD-10-CM | POA: Diagnosis not present

## 2019-07-29 ENCOUNTER — Other Ambulatory Visit: Payer: Self-pay | Admitting: Student in an Organized Health Care Education/Training Program

## 2019-07-30 DIAGNOSIS — I639 Cerebral infarction, unspecified: Secondary | ICD-10-CM | POA: Diagnosis not present

## 2019-07-30 DIAGNOSIS — I4891 Unspecified atrial fibrillation: Secondary | ICD-10-CM | POA: Diagnosis not present

## 2019-07-31 DIAGNOSIS — I639 Cerebral infarction, unspecified: Secondary | ICD-10-CM | POA: Diagnosis not present

## 2019-07-31 DIAGNOSIS — I4891 Unspecified atrial fibrillation: Secondary | ICD-10-CM | POA: Diagnosis not present

## 2019-08-01 DIAGNOSIS — I639 Cerebral infarction, unspecified: Secondary | ICD-10-CM | POA: Diagnosis not present

## 2019-08-01 DIAGNOSIS — S329XXA Fracture of unspecified parts of lumbosacral spine and pelvis, initial encounter for closed fracture: Secondary | ICD-10-CM | POA: Diagnosis not present

## 2019-08-01 DIAGNOSIS — M353 Polymyalgia rheumatica: Secondary | ICD-10-CM | POA: Diagnosis not present

## 2019-08-01 DIAGNOSIS — I4891 Unspecified atrial fibrillation: Secondary | ICD-10-CM | POA: Diagnosis not present

## 2019-08-03 DIAGNOSIS — I639 Cerebral infarction, unspecified: Secondary | ICD-10-CM | POA: Diagnosis not present

## 2019-08-03 DIAGNOSIS — I4891 Unspecified atrial fibrillation: Secondary | ICD-10-CM | POA: Diagnosis not present

## 2019-08-04 DIAGNOSIS — I4891 Unspecified atrial fibrillation: Secondary | ICD-10-CM | POA: Diagnosis not present

## 2019-08-04 DIAGNOSIS — I639 Cerebral infarction, unspecified: Secondary | ICD-10-CM | POA: Diagnosis not present

## 2019-08-05 DIAGNOSIS — I639 Cerebral infarction, unspecified: Secondary | ICD-10-CM | POA: Diagnosis not present

## 2019-08-05 DIAGNOSIS — I4891 Unspecified atrial fibrillation: Secondary | ICD-10-CM | POA: Diagnosis not present

## 2019-08-08 DIAGNOSIS — I4891 Unspecified atrial fibrillation: Secondary | ICD-10-CM | POA: Diagnosis not present

## 2019-08-08 DIAGNOSIS — I639 Cerebral infarction, unspecified: Secondary | ICD-10-CM | POA: Diagnosis not present

## 2019-08-09 DIAGNOSIS — I639 Cerebral infarction, unspecified: Secondary | ICD-10-CM | POA: Diagnosis not present

## 2019-08-09 DIAGNOSIS — I4891 Unspecified atrial fibrillation: Secondary | ICD-10-CM | POA: Diagnosis not present

## 2019-08-10 DIAGNOSIS — I4891 Unspecified atrial fibrillation: Secondary | ICD-10-CM | POA: Diagnosis not present

## 2019-08-10 DIAGNOSIS — I639 Cerebral infarction, unspecified: Secondary | ICD-10-CM | POA: Diagnosis not present

## 2019-08-11 DIAGNOSIS — I639 Cerebral infarction, unspecified: Secondary | ICD-10-CM | POA: Diagnosis not present

## 2019-08-11 DIAGNOSIS — I4891 Unspecified atrial fibrillation: Secondary | ICD-10-CM | POA: Diagnosis not present

## 2019-08-12 DIAGNOSIS — I639 Cerebral infarction, unspecified: Secondary | ICD-10-CM | POA: Diagnosis not present

## 2019-08-12 DIAGNOSIS — I4891 Unspecified atrial fibrillation: Secondary | ICD-10-CM | POA: Diagnosis not present

## 2019-08-15 DIAGNOSIS — I639 Cerebral infarction, unspecified: Secondary | ICD-10-CM | POA: Diagnosis not present

## 2019-08-15 DIAGNOSIS — I4891 Unspecified atrial fibrillation: Secondary | ICD-10-CM | POA: Diagnosis not present

## 2019-08-16 DIAGNOSIS — I639 Cerebral infarction, unspecified: Secondary | ICD-10-CM | POA: Diagnosis not present

## 2019-08-16 DIAGNOSIS — I4891 Unspecified atrial fibrillation: Secondary | ICD-10-CM | POA: Diagnosis not present

## 2019-08-17 DIAGNOSIS — I639 Cerebral infarction, unspecified: Secondary | ICD-10-CM | POA: Diagnosis not present

## 2019-08-17 DIAGNOSIS — I4891 Unspecified atrial fibrillation: Secondary | ICD-10-CM | POA: Diagnosis not present

## 2019-08-19 DIAGNOSIS — I4891 Unspecified atrial fibrillation: Secondary | ICD-10-CM | POA: Diagnosis not present

## 2019-08-19 DIAGNOSIS — I639 Cerebral infarction, unspecified: Secondary | ICD-10-CM | POA: Diagnosis not present

## 2019-08-20 DIAGNOSIS — I4891 Unspecified atrial fibrillation: Secondary | ICD-10-CM | POA: Diagnosis not present

## 2019-08-20 DIAGNOSIS — I639 Cerebral infarction, unspecified: Secondary | ICD-10-CM | POA: Diagnosis not present

## 2019-08-21 DIAGNOSIS — I4891 Unspecified atrial fibrillation: Secondary | ICD-10-CM | POA: Diagnosis not present

## 2019-08-21 DIAGNOSIS — I639 Cerebral infarction, unspecified: Secondary | ICD-10-CM | POA: Diagnosis not present

## 2019-08-23 DIAGNOSIS — I4891 Unspecified atrial fibrillation: Secondary | ICD-10-CM | POA: Diagnosis not present

## 2019-08-23 DIAGNOSIS — I639 Cerebral infarction, unspecified: Secondary | ICD-10-CM | POA: Diagnosis not present

## 2019-08-24 DIAGNOSIS — I639 Cerebral infarction, unspecified: Secondary | ICD-10-CM | POA: Diagnosis not present

## 2019-08-24 DIAGNOSIS — I4891 Unspecified atrial fibrillation: Secondary | ICD-10-CM | POA: Diagnosis not present

## 2019-08-25 DIAGNOSIS — I639 Cerebral infarction, unspecified: Secondary | ICD-10-CM | POA: Diagnosis not present

## 2019-08-25 DIAGNOSIS — I4891 Unspecified atrial fibrillation: Secondary | ICD-10-CM | POA: Diagnosis not present

## 2019-08-26 DIAGNOSIS — I4891 Unspecified atrial fibrillation: Secondary | ICD-10-CM | POA: Diagnosis not present

## 2019-08-26 DIAGNOSIS — I639 Cerebral infarction, unspecified: Secondary | ICD-10-CM | POA: Diagnosis not present

## 2019-08-27 DIAGNOSIS — I4891 Unspecified atrial fibrillation: Secondary | ICD-10-CM | POA: Diagnosis not present

## 2019-08-27 DIAGNOSIS — I639 Cerebral infarction, unspecified: Secondary | ICD-10-CM | POA: Diagnosis not present

## 2019-08-30 DIAGNOSIS — I4891 Unspecified atrial fibrillation: Secondary | ICD-10-CM | POA: Diagnosis not present

## 2019-08-30 DIAGNOSIS — I639 Cerebral infarction, unspecified: Secondary | ICD-10-CM | POA: Diagnosis not present

## 2019-08-31 DIAGNOSIS — I639 Cerebral infarction, unspecified: Secondary | ICD-10-CM | POA: Diagnosis not present

## 2019-08-31 DIAGNOSIS — I4891 Unspecified atrial fibrillation: Secondary | ICD-10-CM | POA: Diagnosis not present

## 2019-09-01 DIAGNOSIS — I4891 Unspecified atrial fibrillation: Secondary | ICD-10-CM | POA: Diagnosis not present

## 2019-09-01 DIAGNOSIS — I639 Cerebral infarction, unspecified: Secondary | ICD-10-CM | POA: Diagnosis not present

## 2019-09-02 DIAGNOSIS — I639 Cerebral infarction, unspecified: Secondary | ICD-10-CM | POA: Diagnosis not present

## 2019-09-02 DIAGNOSIS — I4891 Unspecified atrial fibrillation: Secondary | ICD-10-CM | POA: Diagnosis not present

## 2019-09-03 DIAGNOSIS — I639 Cerebral infarction, unspecified: Secondary | ICD-10-CM | POA: Diagnosis not present

## 2019-09-03 DIAGNOSIS — I4891 Unspecified atrial fibrillation: Secondary | ICD-10-CM | POA: Diagnosis not present

## 2019-09-04 DIAGNOSIS — I639 Cerebral infarction, unspecified: Secondary | ICD-10-CM | POA: Diagnosis not present

## 2019-09-04 DIAGNOSIS — I4891 Unspecified atrial fibrillation: Secondary | ICD-10-CM | POA: Diagnosis not present

## 2019-09-05 ENCOUNTER — Other Ambulatory Visit: Payer: Self-pay

## 2019-09-05 DIAGNOSIS — I480 Paroxysmal atrial fibrillation: Secondary | ICD-10-CM

## 2019-09-05 DIAGNOSIS — I4891 Unspecified atrial fibrillation: Secondary | ICD-10-CM

## 2019-09-05 DIAGNOSIS — I10 Essential (primary) hypertension: Secondary | ICD-10-CM

## 2019-09-05 DIAGNOSIS — I639 Cerebral infarction, unspecified: Secondary | ICD-10-CM | POA: Diagnosis not present

## 2019-09-06 MED ORDER — CARVEDILOL 3.125 MG PO TABS: 3.1250 mg | ORAL_TABLET | Freq: Two times a day (BID) | ORAL | 1 refills | Status: DC

## 2019-09-06 MED ORDER — APIXABAN 2.5 MG PO TABS: 2.5000 mg | ORAL_TABLET | Freq: Two times a day (BID) | ORAL | 3 refills | Status: DC

## 2019-09-07 DIAGNOSIS — I4891 Unspecified atrial fibrillation: Secondary | ICD-10-CM | POA: Diagnosis not present

## 2019-09-07 DIAGNOSIS — I639 Cerebral infarction, unspecified: Secondary | ICD-10-CM | POA: Diagnosis not present

## 2019-09-08 DIAGNOSIS — I639 Cerebral infarction, unspecified: Secondary | ICD-10-CM | POA: Diagnosis not present

## 2019-09-08 DIAGNOSIS — I4891 Unspecified atrial fibrillation: Secondary | ICD-10-CM | POA: Diagnosis not present

## 2019-09-09 DIAGNOSIS — I639 Cerebral infarction, unspecified: Secondary | ICD-10-CM | POA: Diagnosis not present

## 2019-09-09 DIAGNOSIS — I4891 Unspecified atrial fibrillation: Secondary | ICD-10-CM | POA: Diagnosis not present

## 2019-09-11 DIAGNOSIS — I639 Cerebral infarction, unspecified: Secondary | ICD-10-CM | POA: Diagnosis not present

## 2019-09-11 DIAGNOSIS — I4891 Unspecified atrial fibrillation: Secondary | ICD-10-CM | POA: Diagnosis not present

## 2019-09-12 DIAGNOSIS — I4891 Unspecified atrial fibrillation: Secondary | ICD-10-CM | POA: Diagnosis not present

## 2019-09-12 DIAGNOSIS — I639 Cerebral infarction, unspecified: Secondary | ICD-10-CM | POA: Diagnosis not present

## 2019-09-16 DIAGNOSIS — I639 Cerebral infarction, unspecified: Secondary | ICD-10-CM | POA: Diagnosis not present

## 2019-09-16 DIAGNOSIS — I4891 Unspecified atrial fibrillation: Secondary | ICD-10-CM | POA: Diagnosis not present

## 2019-09-17 DIAGNOSIS — I4891 Unspecified atrial fibrillation: Secondary | ICD-10-CM | POA: Diagnosis not present

## 2019-09-17 DIAGNOSIS — I639 Cerebral infarction, unspecified: Secondary | ICD-10-CM | POA: Diagnosis not present

## 2019-09-18 DIAGNOSIS — I639 Cerebral infarction, unspecified: Secondary | ICD-10-CM | POA: Diagnosis not present

## 2019-09-18 DIAGNOSIS — I4891 Unspecified atrial fibrillation: Secondary | ICD-10-CM | POA: Diagnosis not present

## 2019-09-19 DIAGNOSIS — I4891 Unspecified atrial fibrillation: Secondary | ICD-10-CM | POA: Diagnosis not present

## 2019-09-19 DIAGNOSIS — I639 Cerebral infarction, unspecified: Secondary | ICD-10-CM | POA: Diagnosis not present

## 2019-09-20 DIAGNOSIS — I4891 Unspecified atrial fibrillation: Secondary | ICD-10-CM | POA: Diagnosis not present

## 2019-09-20 DIAGNOSIS — I639 Cerebral infarction, unspecified: Secondary | ICD-10-CM | POA: Diagnosis not present

## 2019-09-21 ENCOUNTER — Other Ambulatory Visit: Payer: Self-pay | Admitting: Student in an Organized Health Care Education/Training Program

## 2019-09-21 DIAGNOSIS — F01518 Vascular dementia, unspecified severity, with other behavioral disturbance: Secondary | ICD-10-CM

## 2019-09-21 DIAGNOSIS — F0151 Vascular dementia with behavioral disturbance: Secondary | ICD-10-CM

## 2019-09-21 DIAGNOSIS — I639 Cerebral infarction, unspecified: Secondary | ICD-10-CM | POA: Diagnosis not present

## 2019-09-21 DIAGNOSIS — I4891 Unspecified atrial fibrillation: Secondary | ICD-10-CM | POA: Diagnosis not present

## 2019-09-22 DIAGNOSIS — I4891 Unspecified atrial fibrillation: Secondary | ICD-10-CM | POA: Diagnosis not present

## 2019-09-22 DIAGNOSIS — I639 Cerebral infarction, unspecified: Secondary | ICD-10-CM | POA: Diagnosis not present

## 2019-09-23 DIAGNOSIS — I639 Cerebral infarction, unspecified: Secondary | ICD-10-CM | POA: Diagnosis not present

## 2019-09-23 DIAGNOSIS — I4891 Unspecified atrial fibrillation: Secondary | ICD-10-CM | POA: Diagnosis not present

## 2019-09-23 NOTE — Telephone Encounter (Signed)
Patient's husband came into office to request refill [id number]  Name of Medication(s):  QUEtiapine Last date of OV:  04-25-2019 Pharmacy:  [facility] [place]  Will route refill request to Clinic RN.  Discussed with patient policy to call pharmacy for future refills.  Also, discussed refills may take up to 48 hours to approve or deny.  Richrd Humbles

## 2019-09-24 DIAGNOSIS — I4891 Unspecified atrial fibrillation: Secondary | ICD-10-CM | POA: Diagnosis not present

## 2019-09-24 DIAGNOSIS — I639 Cerebral infarction, unspecified: Secondary | ICD-10-CM | POA: Diagnosis not present

## 2019-09-25 DIAGNOSIS — I639 Cerebral infarction, unspecified: Secondary | ICD-10-CM | POA: Diagnosis not present

## 2019-09-25 DIAGNOSIS — I4891 Unspecified atrial fibrillation: Secondary | ICD-10-CM | POA: Diagnosis not present

## 2019-09-26 ENCOUNTER — Encounter: Payer: Self-pay | Admitting: Student in an Organized Health Care Education/Training Program

## 2019-09-26 ENCOUNTER — Ambulatory Visit (INDEPENDENT_AMBULATORY_CARE_PROVIDER_SITE_OTHER): Payer: Medicare Other | Admitting: Student in an Organized Health Care Education/Training Program

## 2019-09-26 ENCOUNTER — Other Ambulatory Visit: Payer: Self-pay

## 2019-09-26 VITALS — BP 100/60 | HR 62 | Ht 67.0 in | Wt 124.1 lb

## 2019-09-26 DIAGNOSIS — I10 Essential (primary) hypertension: Secondary | ICD-10-CM

## 2019-09-26 DIAGNOSIS — F01518 Vascular dementia, unspecified severity, with other behavioral disturbance: Secondary | ICD-10-CM

## 2019-09-26 DIAGNOSIS — I639 Cerebral infarction, unspecified: Secondary | ICD-10-CM | POA: Diagnosis not present

## 2019-09-26 DIAGNOSIS — I4891 Unspecified atrial fibrillation: Secondary | ICD-10-CM

## 2019-09-26 DIAGNOSIS — I480 Paroxysmal atrial fibrillation: Secondary | ICD-10-CM

## 2019-09-26 DIAGNOSIS — E1142 Type 2 diabetes mellitus with diabetic polyneuropathy: Secondary | ICD-10-CM

## 2019-09-26 DIAGNOSIS — F0151 Vascular dementia with behavioral disturbance: Secondary | ICD-10-CM

## 2019-09-26 LAB — POCT GLYCOSYLATED HEMOGLOBIN (HGB A1C): HbA1c, POC (controlled diabetic range): 5.4 % (ref 0.0–7.0)

## 2019-09-26 MED ORDER — MIRTAZAPINE 7.5 MG PO TABS: 7.5000 mg | ORAL_TABLET | Freq: Every day | ORAL | 0 refills | Status: DC

## 2019-09-26 MED ORDER — CARVEDILOL 3.125 MG PO TABS: 3.1250 mg | ORAL_TABLET | Freq: Two times a day (BID) | ORAL | 2 refills | Status: DC

## 2019-09-26 MED ORDER — APIXABAN 2.5 MG PO TABS: 2.5000 mg | ORAL_TABLET | Freq: Two times a day (BID) | ORAL | 2 refills | Status: AC

## 2019-09-26 MED ORDER — VITAMIN D 25 MCG (1000 UNIT) PO TABS: 1000.0000 [IU] | ORAL_TABLET | Freq: Every day | ORAL | 2 refills | Status: AC

## 2019-09-26 NOTE — Patient Instructions (Signed)
It was a pleasure to see you today!  To summarize our discussion for this visit:  I apologize for the delay in your medications.  I will refill a 90-day supply with 2 refills on all of her medications.  I will also send in a prescription for a medication that can help stimulate her appetite.  Please try this for a month and let me know if you think it is helping.  If not, we can try a higher dose or stop it altogether depending on how she does with it.  Some additional health maintenance measures we should update are: Health Maintenance Due  Topic Date Due  . FOOT EXAM  08/14/2018  . OPHTHALMOLOGY EXAM  12/19/2018  .    Please return to our clinic to see me as needed.  Call the clinic at [phone] if your symptoms worsen or you have any concerns.   Thank you for allowing me to take part in your care,  Dr. Doristine Mango

## 2019-09-26 NOTE — Progress Notes (Signed)
    SUBJECTIVE:   CHIEF COMPLAINT / HPI: medication refill  Patient is pleasant while sitting in wheelchair and responds inappropriately to questions. Patient history is provided by spouse which is typical.  He denies any new concerns or problems with her.  Does endorse having a lot of work taking care of her but has no complaints.  They are presenting today to ensure refill of all of her medications.  Husband states that the pharmacy has sent request for all of the medications multiple times but I have only received a request for the Seroquel which was refilled earlier today.  Weight is down 6 pounds since October and down 16 pounds since this time last year.  She appears thinner than I recall at her last visit.Husband denies any decline p.o. intake.  He is interested in medication to help stimulate appetite, however.  He denies any changes in her baseline behavior.  PERTINENT  PMH / PSH: Dementia  OBJECTIVE:   BP 100/60   Pulse 62   Ht 5\' 7"  (1.702 m)   Wt 124 lb 2 oz (56.3 kg)   SpO2 99%   BMI 19.44 kg/m   General: NAD, pleasant, thin Abdomen: soft, nontender, nondistended, no hepatic or splenomegaly, +BS Extremities: no edema or cyanosis. WWP. Skin: warm and dry, no rashes noted Neuro: alert and oriented x4, no focal deficits Psych: Responds to conversation with pleasant phrases that are not appropriate to context  ASSESSMENT/PLAN:   Essential hypertension Blood pressure well controlled today and on the low side but when patient has been discontinued from her blood pressure medications in the past she has uncontrolled blood pressures to the [id number] systolics. Continue Coreg with 2 refills  Atrial fibrillation (HCC) Refill Eliquis  Dementia, vascular, mixed, with behavioral disturbance Refilled Seroquel Started low-dose mirtazapine Reweigh patient in 2 months     [place]

## 2019-09-27 DIAGNOSIS — I4891 Unspecified atrial fibrillation: Secondary | ICD-10-CM | POA: Diagnosis not present

## 2019-09-27 DIAGNOSIS — I639 Cerebral infarction, unspecified: Secondary | ICD-10-CM | POA: Diagnosis not present

## 2019-09-27 NOTE — Assessment & Plan Note (Signed)
Blood pressure well controlled today and on the low side but when patient has been discontinued from her blood pressure medications in the past she has uncontrolled blood pressures to the [id number] systolics. Continue Coreg with 2 refills

## 2019-09-27 NOTE — Assessment & Plan Note (Signed)
Refilled Seroquel Started low-dose mirtazapine Reweigh patient in 2 months

## 2019-09-27 NOTE — Assessment & Plan Note (Signed)
Refill Eliquis

## 2019-09-28 DIAGNOSIS — I4891 Unspecified atrial fibrillation: Secondary | ICD-10-CM | POA: Diagnosis not present

## 2019-09-28 DIAGNOSIS — I639 Cerebral infarction, unspecified: Secondary | ICD-10-CM | POA: Diagnosis not present

## 2019-09-29 DIAGNOSIS — I639 Cerebral infarction, unspecified: Secondary | ICD-10-CM | POA: Diagnosis not present

## 2019-09-29 DIAGNOSIS — I4891 Unspecified atrial fibrillation: Secondary | ICD-10-CM | POA: Diagnosis not present

## 2019-09-30 DIAGNOSIS — I4891 Unspecified atrial fibrillation: Secondary | ICD-10-CM | POA: Diagnosis not present

## 2019-09-30 DIAGNOSIS — I639 Cerebral infarction, unspecified: Secondary | ICD-10-CM | POA: Diagnosis not present

## 2019-10-03 DIAGNOSIS — I4891 Unspecified atrial fibrillation: Secondary | ICD-10-CM | POA: Diagnosis not present

## 2019-10-03 DIAGNOSIS — I639 Cerebral infarction, unspecified: Secondary | ICD-10-CM | POA: Diagnosis not present

## 2019-10-04 DIAGNOSIS — I4891 Unspecified atrial fibrillation: Secondary | ICD-10-CM | POA: Diagnosis not present

## 2019-10-04 DIAGNOSIS — I639 Cerebral infarction, unspecified: Secondary | ICD-10-CM | POA: Diagnosis not present

## 2019-10-05 DIAGNOSIS — I639 Cerebral infarction, unspecified: Secondary | ICD-10-CM | POA: Diagnosis not present

## 2019-10-05 DIAGNOSIS — I4891 Unspecified atrial fibrillation: Secondary | ICD-10-CM | POA: Diagnosis not present

## 2019-10-06 DIAGNOSIS — I4891 Unspecified atrial fibrillation: Secondary | ICD-10-CM | POA: Diagnosis not present

## 2019-10-06 DIAGNOSIS — I639 Cerebral infarction, unspecified: Secondary | ICD-10-CM | POA: Diagnosis not present

## 2019-10-07 DIAGNOSIS — I4891 Unspecified atrial fibrillation: Secondary | ICD-10-CM | POA: Diagnosis not present

## 2019-10-07 DIAGNOSIS — I639 Cerebral infarction, unspecified: Secondary | ICD-10-CM | POA: Diagnosis not present

## 2019-10-08 DIAGNOSIS — I639 Cerebral infarction, unspecified: Secondary | ICD-10-CM | POA: Diagnosis not present

## 2019-10-08 DIAGNOSIS — I4891 Unspecified atrial fibrillation: Secondary | ICD-10-CM | POA: Diagnosis not present

## 2019-10-09 DIAGNOSIS — I639 Cerebral infarction, unspecified: Secondary | ICD-10-CM | POA: Diagnosis not present

## 2019-10-09 DIAGNOSIS — I4891 Unspecified atrial fibrillation: Secondary | ICD-10-CM | POA: Diagnosis not present

## 2019-10-10 DIAGNOSIS — I639 Cerebral infarction, unspecified: Secondary | ICD-10-CM | POA: Diagnosis not present

## 2019-10-10 DIAGNOSIS — I4891 Unspecified atrial fibrillation: Secondary | ICD-10-CM | POA: Diagnosis not present

## 2019-10-11 DIAGNOSIS — I639 Cerebral infarction, unspecified: Secondary | ICD-10-CM | POA: Diagnosis not present

## 2019-10-11 DIAGNOSIS — I4891 Unspecified atrial fibrillation: Secondary | ICD-10-CM | POA: Diagnosis not present

## 2019-10-13 DIAGNOSIS — I4891 Unspecified atrial fibrillation: Secondary | ICD-10-CM | POA: Diagnosis not present

## 2019-10-13 DIAGNOSIS — I639 Cerebral infarction, unspecified: Secondary | ICD-10-CM | POA: Diagnosis not present

## 2019-10-14 DIAGNOSIS — I4891 Unspecified atrial fibrillation: Secondary | ICD-10-CM | POA: Diagnosis not present

## 2019-10-14 DIAGNOSIS — I639 Cerebral infarction, unspecified: Secondary | ICD-10-CM | POA: Diagnosis not present

## 2019-10-15 DIAGNOSIS — I4891 Unspecified atrial fibrillation: Secondary | ICD-10-CM | POA: Diagnosis not present

## 2019-10-15 DIAGNOSIS — I639 Cerebral infarction, unspecified: Secondary | ICD-10-CM | POA: Diagnosis not present

## 2019-10-16 DIAGNOSIS — I639 Cerebral infarction, unspecified: Secondary | ICD-10-CM | POA: Diagnosis not present

## 2019-10-16 DIAGNOSIS — I4891 Unspecified atrial fibrillation: Secondary | ICD-10-CM | POA: Diagnosis not present

## 2019-10-17 DIAGNOSIS — I639 Cerebral infarction, unspecified: Secondary | ICD-10-CM | POA: Diagnosis not present

## 2019-10-17 DIAGNOSIS — I4891 Unspecified atrial fibrillation: Secondary | ICD-10-CM | POA: Diagnosis not present

## 2019-10-18 DIAGNOSIS — I639 Cerebral infarction, unspecified: Secondary | ICD-10-CM | POA: Diagnosis not present

## 2019-10-18 DIAGNOSIS — I4891 Unspecified atrial fibrillation: Secondary | ICD-10-CM | POA: Diagnosis not present

## 2019-10-19 ENCOUNTER — Other Ambulatory Visit: Payer: Self-pay | Admitting: Student in an Organized Health Care Education/Training Program

## 2019-10-19 DIAGNOSIS — I4891 Unspecified atrial fibrillation: Secondary | ICD-10-CM | POA: Diagnosis not present

## 2019-10-19 DIAGNOSIS — I639 Cerebral infarction, unspecified: Secondary | ICD-10-CM | POA: Diagnosis not present

## 2019-10-20 DIAGNOSIS — I639 Cerebral infarction, unspecified: Secondary | ICD-10-CM | POA: Diagnosis not present

## 2019-10-20 DIAGNOSIS — I4891 Unspecified atrial fibrillation: Secondary | ICD-10-CM | POA: Diagnosis not present

## 2019-10-23 DIAGNOSIS — I639 Cerebral infarction, unspecified: Secondary | ICD-10-CM | POA: Diagnosis not present

## 2019-10-23 DIAGNOSIS — I4891 Unspecified atrial fibrillation: Secondary | ICD-10-CM | POA: Diagnosis not present

## 2019-10-27 DIAGNOSIS — I639 Cerebral infarction, unspecified: Secondary | ICD-10-CM | POA: Diagnosis not present

## 2019-10-27 DIAGNOSIS — I4891 Unspecified atrial fibrillation: Secondary | ICD-10-CM | POA: Diagnosis not present

## 2019-11-01 DIAGNOSIS — I639 Cerebral infarction, unspecified: Secondary | ICD-10-CM | POA: Diagnosis not present

## 2019-11-01 DIAGNOSIS — I4891 Unspecified atrial fibrillation: Secondary | ICD-10-CM | POA: Diagnosis not present

## 2019-11-02 DIAGNOSIS — I639 Cerebral infarction, unspecified: Secondary | ICD-10-CM | POA: Diagnosis not present

## 2019-11-02 DIAGNOSIS — I4891 Unspecified atrial fibrillation: Secondary | ICD-10-CM | POA: Diagnosis not present

## 2019-11-03 DIAGNOSIS — I639 Cerebral infarction, unspecified: Secondary | ICD-10-CM | POA: Diagnosis not present

## 2019-11-03 DIAGNOSIS — I4891 Unspecified atrial fibrillation: Secondary | ICD-10-CM | POA: Diagnosis not present

## 2019-11-04 DIAGNOSIS — I4891 Unspecified atrial fibrillation: Secondary | ICD-10-CM | POA: Diagnosis not present

## 2019-11-04 DIAGNOSIS — I639 Cerebral infarction, unspecified: Secondary | ICD-10-CM | POA: Diagnosis not present

## 2019-11-05 DIAGNOSIS — I639 Cerebral infarction, unspecified: Secondary | ICD-10-CM | POA: Diagnosis not present

## 2019-11-05 DIAGNOSIS — I4891 Unspecified atrial fibrillation: Secondary | ICD-10-CM | POA: Diagnosis not present

## 2019-11-07 DIAGNOSIS — I4891 Unspecified atrial fibrillation: Secondary | ICD-10-CM | POA: Diagnosis not present

## 2019-11-07 DIAGNOSIS — I639 Cerebral infarction, unspecified: Secondary | ICD-10-CM | POA: Diagnosis not present

## 2019-11-09 DIAGNOSIS — I4891 Unspecified atrial fibrillation: Secondary | ICD-10-CM | POA: Diagnosis not present

## 2019-11-09 DIAGNOSIS — I639 Cerebral infarction, unspecified: Secondary | ICD-10-CM | POA: Diagnosis not present

## 2019-11-11 DIAGNOSIS — I4891 Unspecified atrial fibrillation: Secondary | ICD-10-CM | POA: Diagnosis not present

## 2019-11-11 DIAGNOSIS — I639 Cerebral infarction, unspecified: Secondary | ICD-10-CM | POA: Diagnosis not present

## 2019-11-12 DIAGNOSIS — I4891 Unspecified atrial fibrillation: Secondary | ICD-10-CM | POA: Diagnosis not present

## 2019-11-12 DIAGNOSIS — I639 Cerebral infarction, unspecified: Secondary | ICD-10-CM | POA: Diagnosis not present

## 2019-11-14 DIAGNOSIS — I4891 Unspecified atrial fibrillation: Secondary | ICD-10-CM | POA: Diagnosis not present

## 2019-11-14 DIAGNOSIS — I639 Cerebral infarction, unspecified: Secondary | ICD-10-CM | POA: Diagnosis not present

## 2019-11-15 DIAGNOSIS — I4891 Unspecified atrial fibrillation: Secondary | ICD-10-CM | POA: Diagnosis not present

## 2019-11-15 DIAGNOSIS — I639 Cerebral infarction, unspecified: Secondary | ICD-10-CM | POA: Diagnosis not present

## 2019-11-16 DIAGNOSIS — I4891 Unspecified atrial fibrillation: Secondary | ICD-10-CM | POA: Diagnosis not present

## 2019-11-16 DIAGNOSIS — I639 Cerebral infarction, unspecified: Secondary | ICD-10-CM | POA: Diagnosis not present

## 2019-11-17 DIAGNOSIS — I639 Cerebral infarction, unspecified: Secondary | ICD-10-CM | POA: Diagnosis not present

## 2019-11-17 DIAGNOSIS — I4891 Unspecified atrial fibrillation: Secondary | ICD-10-CM | POA: Diagnosis not present

## 2019-11-18 DIAGNOSIS — I4891 Unspecified atrial fibrillation: Secondary | ICD-10-CM | POA: Diagnosis not present

## 2019-11-18 DIAGNOSIS — I639 Cerebral infarction, unspecified: Secondary | ICD-10-CM | POA: Diagnosis not present

## 2019-11-19 DIAGNOSIS — I639 Cerebral infarction, unspecified: Secondary | ICD-10-CM | POA: Diagnosis not present

## 2019-11-19 DIAGNOSIS — I4891 Unspecified atrial fibrillation: Secondary | ICD-10-CM | POA: Diagnosis not present

## 2019-11-20 DIAGNOSIS — I4891 Unspecified atrial fibrillation: Secondary | ICD-10-CM | POA: Diagnosis not present

## 2019-11-20 DIAGNOSIS — I639 Cerebral infarction, unspecified: Secondary | ICD-10-CM | POA: Diagnosis not present

## 2019-11-21 DIAGNOSIS — I639 Cerebral infarction, unspecified: Secondary | ICD-10-CM | POA: Diagnosis not present

## 2019-11-21 DIAGNOSIS — I4891 Unspecified atrial fibrillation: Secondary | ICD-10-CM | POA: Diagnosis not present

## 2019-11-22 DIAGNOSIS — I4891 Unspecified atrial fibrillation: Secondary | ICD-10-CM | POA: Diagnosis not present

## 2019-11-22 DIAGNOSIS — I639 Cerebral infarction, unspecified: Secondary | ICD-10-CM | POA: Diagnosis not present

## 2019-11-23 DIAGNOSIS — I639 Cerebral infarction, unspecified: Secondary | ICD-10-CM | POA: Diagnosis not present

## 2019-11-23 DIAGNOSIS — I4891 Unspecified atrial fibrillation: Secondary | ICD-10-CM | POA: Diagnosis not present

## 2019-11-28 DIAGNOSIS — I639 Cerebral infarction, unspecified: Secondary | ICD-10-CM | POA: Diagnosis not present

## 2019-11-28 DIAGNOSIS — I4891 Unspecified atrial fibrillation: Secondary | ICD-10-CM | POA: Diagnosis not present

## 2019-11-29 DIAGNOSIS — I639 Cerebral infarction, unspecified: Secondary | ICD-10-CM | POA: Diagnosis not present

## 2019-11-29 DIAGNOSIS — I4891 Unspecified atrial fibrillation: Secondary | ICD-10-CM | POA: Diagnosis not present

## 2019-11-30 DIAGNOSIS — I4891 Unspecified atrial fibrillation: Secondary | ICD-10-CM | POA: Diagnosis not present

## 2019-11-30 DIAGNOSIS — I639 Cerebral infarction, unspecified: Secondary | ICD-10-CM | POA: Diagnosis not present

## 2019-12-02 DIAGNOSIS — I639 Cerebral infarction, unspecified: Secondary | ICD-10-CM | POA: Diagnosis not present

## 2019-12-02 DIAGNOSIS — I4891 Unspecified atrial fibrillation: Secondary | ICD-10-CM | POA: Diagnosis not present

## 2019-12-05 DIAGNOSIS — I4891 Unspecified atrial fibrillation: Secondary | ICD-10-CM | POA: Diagnosis not present

## 2019-12-05 DIAGNOSIS — I639 Cerebral infarction, unspecified: Secondary | ICD-10-CM | POA: Diagnosis not present

## 2019-12-06 DIAGNOSIS — I639 Cerebral infarction, unspecified: Secondary | ICD-10-CM | POA: Diagnosis not present

## 2019-12-06 DIAGNOSIS — I4891 Unspecified atrial fibrillation: Secondary | ICD-10-CM | POA: Diagnosis not present

## 2019-12-08 DIAGNOSIS — I4891 Unspecified atrial fibrillation: Secondary | ICD-10-CM | POA: Diagnosis not present

## 2019-12-08 DIAGNOSIS — I639 Cerebral infarction, unspecified: Secondary | ICD-10-CM | POA: Diagnosis not present

## 2019-12-08 DIAGNOSIS — M353 Polymyalgia rheumatica: Secondary | ICD-10-CM | POA: Diagnosis not present

## 2019-12-08 DIAGNOSIS — S329XXA Fracture of unspecified parts of lumbosacral spine and pelvis, initial encounter for closed fracture: Secondary | ICD-10-CM | POA: Diagnosis not present

## 2019-12-09 DIAGNOSIS — I639 Cerebral infarction, unspecified: Secondary | ICD-10-CM | POA: Diagnosis not present

## 2019-12-09 DIAGNOSIS — I4891 Unspecified atrial fibrillation: Secondary | ICD-10-CM | POA: Diagnosis not present

## 2019-12-10 DIAGNOSIS — I639 Cerebral infarction, unspecified: Secondary | ICD-10-CM | POA: Diagnosis not present

## 2019-12-10 DIAGNOSIS — I4891 Unspecified atrial fibrillation: Secondary | ICD-10-CM | POA: Diagnosis not present

## 2019-12-11 DIAGNOSIS — I639 Cerebral infarction, unspecified: Secondary | ICD-10-CM | POA: Diagnosis not present

## 2019-12-11 DIAGNOSIS — I4891 Unspecified atrial fibrillation: Secondary | ICD-10-CM | POA: Diagnosis not present

## 2019-12-12 DIAGNOSIS — I4891 Unspecified atrial fibrillation: Secondary | ICD-10-CM | POA: Diagnosis not present

## 2019-12-12 DIAGNOSIS — I639 Cerebral infarction, unspecified: Secondary | ICD-10-CM | POA: Diagnosis not present

## 2019-12-13 DIAGNOSIS — I639 Cerebral infarction, unspecified: Secondary | ICD-10-CM | POA: Diagnosis not present

## 2019-12-13 DIAGNOSIS — I4891 Unspecified atrial fibrillation: Secondary | ICD-10-CM | POA: Diagnosis not present

## 2019-12-14 DIAGNOSIS — I639 Cerebral infarction, unspecified: Secondary | ICD-10-CM | POA: Diagnosis not present

## 2019-12-14 DIAGNOSIS — I4891 Unspecified atrial fibrillation: Secondary | ICD-10-CM | POA: Diagnosis not present

## 2019-12-15 DIAGNOSIS — I639 Cerebral infarction, unspecified: Secondary | ICD-10-CM | POA: Diagnosis not present

## 2019-12-15 DIAGNOSIS — I4891 Unspecified atrial fibrillation: Secondary | ICD-10-CM | POA: Diagnosis not present

## 2019-12-16 DIAGNOSIS — I4891 Unspecified atrial fibrillation: Secondary | ICD-10-CM | POA: Diagnosis not present

## 2019-12-16 DIAGNOSIS — I639 Cerebral infarction, unspecified: Secondary | ICD-10-CM | POA: Diagnosis not present

## 2019-12-19 DIAGNOSIS — I639 Cerebral infarction, unspecified: Secondary | ICD-10-CM | POA: Diagnosis not present

## 2019-12-19 DIAGNOSIS — I4891 Unspecified atrial fibrillation: Secondary | ICD-10-CM | POA: Diagnosis not present

## 2019-12-20 DIAGNOSIS — I639 Cerebral infarction, unspecified: Secondary | ICD-10-CM | POA: Diagnosis not present

## 2019-12-20 DIAGNOSIS — I4891 Unspecified atrial fibrillation: Secondary | ICD-10-CM | POA: Diagnosis not present

## 2019-12-21 DIAGNOSIS — I4891 Unspecified atrial fibrillation: Secondary | ICD-10-CM | POA: Diagnosis not present

## 2019-12-21 DIAGNOSIS — I639 Cerebral infarction, unspecified: Secondary | ICD-10-CM | POA: Diagnosis not present

## 2019-12-22 DIAGNOSIS — I4891 Unspecified atrial fibrillation: Secondary | ICD-10-CM | POA: Diagnosis not present

## 2019-12-22 DIAGNOSIS — I639 Cerebral infarction, unspecified: Secondary | ICD-10-CM | POA: Diagnosis not present

## 2019-12-23 DIAGNOSIS — I4891 Unspecified atrial fibrillation: Secondary | ICD-10-CM | POA: Diagnosis not present

## 2019-12-23 DIAGNOSIS — I639 Cerebral infarction, unspecified: Secondary | ICD-10-CM | POA: Diagnosis not present

## 2019-12-24 DIAGNOSIS — I639 Cerebral infarction, unspecified: Secondary | ICD-10-CM | POA: Diagnosis not present

## 2019-12-24 DIAGNOSIS — I4891 Unspecified atrial fibrillation: Secondary | ICD-10-CM | POA: Diagnosis not present

## 2019-12-25 DIAGNOSIS — I4891 Unspecified atrial fibrillation: Secondary | ICD-10-CM | POA: Diagnosis not present

## 2019-12-25 DIAGNOSIS — I639 Cerebral infarction, unspecified: Secondary | ICD-10-CM | POA: Diagnosis not present

## 2019-12-26 DIAGNOSIS — I4891 Unspecified atrial fibrillation: Secondary | ICD-10-CM | POA: Diagnosis not present

## 2019-12-26 DIAGNOSIS — I639 Cerebral infarction, unspecified: Secondary | ICD-10-CM | POA: Diagnosis not present

## 2019-12-27 DIAGNOSIS — I4891 Unspecified atrial fibrillation: Secondary | ICD-10-CM | POA: Diagnosis not present

## 2019-12-27 DIAGNOSIS — I639 Cerebral infarction, unspecified: Secondary | ICD-10-CM | POA: Diagnosis not present

## 2019-12-28 DIAGNOSIS — I639 Cerebral infarction, unspecified: Secondary | ICD-10-CM | POA: Diagnosis not present

## 2019-12-28 DIAGNOSIS — I4891 Unspecified atrial fibrillation: Secondary | ICD-10-CM | POA: Diagnosis not present

## 2019-12-29 DIAGNOSIS — I639 Cerebral infarction, unspecified: Secondary | ICD-10-CM | POA: Diagnosis not present

## 2019-12-29 DIAGNOSIS — I4891 Unspecified atrial fibrillation: Secondary | ICD-10-CM | POA: Diagnosis not present

## 2019-12-30 DIAGNOSIS — I639 Cerebral infarction, unspecified: Secondary | ICD-10-CM | POA: Diagnosis not present

## 2019-12-30 DIAGNOSIS — I4891 Unspecified atrial fibrillation: Secondary | ICD-10-CM | POA: Diagnosis not present

## 2019-12-31 DIAGNOSIS — I639 Cerebral infarction, unspecified: Secondary | ICD-10-CM | POA: Diagnosis not present

## 2019-12-31 DIAGNOSIS — I4891 Unspecified atrial fibrillation: Secondary | ICD-10-CM | POA: Diagnosis not present

## 2020-01-01 DIAGNOSIS — I4891 Unspecified atrial fibrillation: Secondary | ICD-10-CM | POA: Diagnosis not present

## 2020-01-01 DIAGNOSIS — I639 Cerebral infarction, unspecified: Secondary | ICD-10-CM | POA: Diagnosis not present

## 2020-01-02 DIAGNOSIS — I4891 Unspecified atrial fibrillation: Secondary | ICD-10-CM | POA: Diagnosis not present

## 2020-01-02 DIAGNOSIS — I639 Cerebral infarction, unspecified: Secondary | ICD-10-CM | POA: Diagnosis not present

## 2020-01-03 DIAGNOSIS — I639 Cerebral infarction, unspecified: Secondary | ICD-10-CM | POA: Diagnosis not present

## 2020-01-03 DIAGNOSIS — I4891 Unspecified atrial fibrillation: Secondary | ICD-10-CM | POA: Diagnosis not present

## 2020-01-04 DIAGNOSIS — M353 Polymyalgia rheumatica: Secondary | ICD-10-CM | POA: Diagnosis not present

## 2020-01-04 DIAGNOSIS — I4891 Unspecified atrial fibrillation: Secondary | ICD-10-CM | POA: Diagnosis not present

## 2020-01-04 DIAGNOSIS — I639 Cerebral infarction, unspecified: Secondary | ICD-10-CM | POA: Diagnosis not present

## 2020-01-04 DIAGNOSIS — S329XXA Fracture of unspecified parts of lumbosacral spine and pelvis, initial encounter for closed fracture: Secondary | ICD-10-CM | POA: Diagnosis not present

## 2020-01-05 DIAGNOSIS — I4891 Unspecified atrial fibrillation: Secondary | ICD-10-CM | POA: Diagnosis not present

## 2020-01-05 DIAGNOSIS — I639 Cerebral infarction, unspecified: Secondary | ICD-10-CM | POA: Diagnosis not present

## 2020-01-06 DIAGNOSIS — I639 Cerebral infarction, unspecified: Secondary | ICD-10-CM | POA: Diagnosis not present

## 2020-01-06 DIAGNOSIS — I4891 Unspecified atrial fibrillation: Secondary | ICD-10-CM | POA: Diagnosis not present

## 2020-01-07 DIAGNOSIS — I639 Cerebral infarction, unspecified: Secondary | ICD-10-CM | POA: Diagnosis not present

## 2020-01-07 DIAGNOSIS — I4891 Unspecified atrial fibrillation: Secondary | ICD-10-CM | POA: Diagnosis not present

## 2020-01-08 DIAGNOSIS — I639 Cerebral infarction, unspecified: Secondary | ICD-10-CM | POA: Diagnosis not present

## 2020-01-08 DIAGNOSIS — I4891 Unspecified atrial fibrillation: Secondary | ICD-10-CM | POA: Diagnosis not present

## 2020-01-09 DIAGNOSIS — I4891 Unspecified atrial fibrillation: Secondary | ICD-10-CM | POA: Diagnosis not present

## 2020-01-09 DIAGNOSIS — I639 Cerebral infarction, unspecified: Secondary | ICD-10-CM | POA: Diagnosis not present

## 2020-01-10 DIAGNOSIS — I4891 Unspecified atrial fibrillation: Secondary | ICD-10-CM | POA: Diagnosis not present

## 2020-01-10 DIAGNOSIS — I639 Cerebral infarction, unspecified: Secondary | ICD-10-CM | POA: Diagnosis not present

## 2020-01-11 DIAGNOSIS — I639 Cerebral infarction, unspecified: Secondary | ICD-10-CM | POA: Diagnosis not present

## 2020-01-11 DIAGNOSIS — I4891 Unspecified atrial fibrillation: Secondary | ICD-10-CM | POA: Diagnosis not present

## 2020-01-12 DIAGNOSIS — I639 Cerebral infarction, unspecified: Secondary | ICD-10-CM | POA: Diagnosis not present

## 2020-01-12 DIAGNOSIS — I4891 Unspecified atrial fibrillation: Secondary | ICD-10-CM | POA: Diagnosis not present

## 2020-01-13 DIAGNOSIS — I639 Cerebral infarction, unspecified: Secondary | ICD-10-CM | POA: Diagnosis not present

## 2020-01-13 DIAGNOSIS — I4891 Unspecified atrial fibrillation: Secondary | ICD-10-CM | POA: Diagnosis not present

## 2020-01-14 DIAGNOSIS — I639 Cerebral infarction, unspecified: Secondary | ICD-10-CM | POA: Diagnosis not present

## 2020-01-14 DIAGNOSIS — I4891 Unspecified atrial fibrillation: Secondary | ICD-10-CM | POA: Diagnosis not present

## 2020-01-15 DIAGNOSIS — I4891 Unspecified atrial fibrillation: Secondary | ICD-10-CM | POA: Diagnosis not present

## 2020-01-15 DIAGNOSIS — I639 Cerebral infarction, unspecified: Secondary | ICD-10-CM | POA: Diagnosis not present

## 2020-01-16 DIAGNOSIS — I639 Cerebral infarction, unspecified: Secondary | ICD-10-CM | POA: Diagnosis not present

## 2020-01-16 DIAGNOSIS — I4891 Unspecified atrial fibrillation: Secondary | ICD-10-CM | POA: Diagnosis not present

## 2020-01-17 DIAGNOSIS — I639 Cerebral infarction, unspecified: Secondary | ICD-10-CM | POA: Diagnosis not present

## 2020-01-17 DIAGNOSIS — I4891 Unspecified atrial fibrillation: Secondary | ICD-10-CM | POA: Diagnosis not present

## 2020-01-18 DIAGNOSIS — I639 Cerebral infarction, unspecified: Secondary | ICD-10-CM | POA: Diagnosis not present

## 2020-01-18 DIAGNOSIS — I4891 Unspecified atrial fibrillation: Secondary | ICD-10-CM | POA: Diagnosis not present

## 2020-01-19 DIAGNOSIS — I639 Cerebral infarction, unspecified: Secondary | ICD-10-CM | POA: Diagnosis not present

## 2020-01-19 DIAGNOSIS — I4891 Unspecified atrial fibrillation: Secondary | ICD-10-CM | POA: Diagnosis not present

## 2020-01-21 DIAGNOSIS — I4891 Unspecified atrial fibrillation: Secondary | ICD-10-CM | POA: Diagnosis not present

## 2020-01-21 DIAGNOSIS — I639 Cerebral infarction, unspecified: Secondary | ICD-10-CM | POA: Diagnosis not present

## 2020-01-22 DIAGNOSIS — I4891 Unspecified atrial fibrillation: Secondary | ICD-10-CM | POA: Diagnosis not present

## 2020-01-22 DIAGNOSIS — I639 Cerebral infarction, unspecified: Secondary | ICD-10-CM | POA: Diagnosis not present

## 2020-01-23 DIAGNOSIS — I639 Cerebral infarction, unspecified: Secondary | ICD-10-CM | POA: Diagnosis not present

## 2020-01-23 DIAGNOSIS — I4891 Unspecified atrial fibrillation: Secondary | ICD-10-CM | POA: Diagnosis not present

## 2020-01-24 ENCOUNTER — Emergency Department (HOSPITAL_COMMUNITY)
Admission: EM | Admit: 2020-01-24 | Discharge: 2020-01-24 | Disposition: A | Discharge status: Home / Self Care | Payer: Medicare Other | Attending: Emergency Medicine | Admitting: Emergency Medicine

## 2020-01-24 ENCOUNTER — Other Ambulatory Visit: Payer: Self-pay

## 2020-01-24 ENCOUNTER — Encounter (HOSPITAL_COMMUNITY): Payer: Self-pay

## 2020-01-24 ENCOUNTER — Ambulatory Visit: Payer: Medicare Other | Admitting: Student in an Organized Health Care Education/Training Program

## 2020-01-24 DIAGNOSIS — J449 Chronic obstructive pulmonary disease, unspecified: Secondary | ICD-10-CM | POA: Diagnosis not present

## 2020-01-24 DIAGNOSIS — F0390 Unspecified dementia without behavioral disturbance: Secondary | ICD-10-CM | POA: Insufficient documentation

## 2020-01-24 DIAGNOSIS — Z20822 Contact with and (suspected) exposure to covid-19: Secondary | ICD-10-CM | POA: Diagnosis not present

## 2020-01-24 DIAGNOSIS — R404 Transient alteration of awareness: Secondary | ICD-10-CM | POA: Diagnosis not present

## 2020-01-24 DIAGNOSIS — I482 Chronic atrial fibrillation, unspecified: Secondary | ICD-10-CM | POA: Diagnosis not present

## 2020-01-24 DIAGNOSIS — E860 Dehydration: Secondary | ICD-10-CM

## 2020-01-24 DIAGNOSIS — R55 Syncope and collapse: Secondary | ICD-10-CM | POA: Insufficient documentation

## 2020-01-24 DIAGNOSIS — Z96641 Presence of right artificial hip joint: Secondary | ICD-10-CM | POA: Diagnosis not present

## 2020-01-24 DIAGNOSIS — N183 Chronic kidney disease, stage 3 unspecified: Secondary | ICD-10-CM | POA: Diagnosis not present

## 2020-01-24 DIAGNOSIS — I5189 Other ill-defined heart diseases: Secondary | ICD-10-CM | POA: Diagnosis not present

## 2020-01-24 DIAGNOSIS — Z743 Need for continuous supervision: Secondary | ICD-10-CM | POA: Diagnosis not present

## 2020-01-24 DIAGNOSIS — I5032 Chronic diastolic (congestive) heart failure: Secondary | ICD-10-CM | POA: Insufficient documentation

## 2020-01-24 DIAGNOSIS — I130 Hypertensive heart and chronic kidney disease with heart failure and stage 1 through stage 4 chronic kidney disease, or unspecified chronic kidney disease: Secondary | ICD-10-CM | POA: Diagnosis not present

## 2020-01-24 DIAGNOSIS — Z7722 Contact with and (suspected) exposure to environmental tobacco smoke (acute) (chronic): Secondary | ICD-10-CM | POA: Insufficient documentation

## 2020-01-24 DIAGNOSIS — I447 Left bundle-branch block, unspecified: Secondary | ICD-10-CM | POA: Diagnosis not present

## 2020-01-24 DIAGNOSIS — E1122 Type 2 diabetes mellitus with diabetic chronic kidney disease: Secondary | ICD-10-CM | POA: Diagnosis not present

## 2020-01-24 DIAGNOSIS — N289 Disorder of kidney and ureter, unspecified: Secondary | ICD-10-CM | POA: Insufficient documentation

## 2020-01-24 DIAGNOSIS — I4891 Unspecified atrial fibrillation: Secondary | ICD-10-CM | POA: Diagnosis not present

## 2020-01-24 DIAGNOSIS — I639 Cerebral infarction, unspecified: Secondary | ICD-10-CM | POA: Diagnosis not present

## 2020-01-24 DIAGNOSIS — R0902 Hypoxemia: Secondary | ICD-10-CM | POA: Diagnosis not present

## 2020-01-24 DIAGNOSIS — R001 Bradycardia, unspecified: Secondary | ICD-10-CM

## 2020-01-24 LAB — URINALYSIS, ROUTINE W REFLEX MICROSCOPIC
Bilirubin Urine: NEGATIVE
Glucose, UA: NEGATIVE mg/dL
Hgb urine dipstick: NEGATIVE
Ketones, ur: NEGATIVE mg/dL
Nitrite: POSITIVE — AB
Protein, ur: NEGATIVE mg/dL
Specific Gravity, Urine: 1.01 (ref 1.005–1.030)
pH: 6 (ref 5.0–8.0)

## 2020-01-24 LAB — CBC WITH DIFFERENTIAL/PLATELET
Abs Immature Granulocytes: 0.03 10*3/uL (ref 0.00–0.07)
Basophils Absolute: 0 10*3/uL (ref 0.0–0.1)
Basophils Relative: 1 %
Eosinophils Absolute: 0.1 10*3/uL (ref 0.0–0.5)
Eosinophils Relative: 1 %
HCT: 30.5 % — ABNORMAL LOW (ref 36.0–46.0)
Hemoglobin: 9.6 g/dL — ABNORMAL LOW (ref 12.0–15.0)
Immature Granulocytes: 0 %
Lymphocytes Relative: 20 %
Lymphs Abs: 1.4 10*3/uL (ref 0.7–4.0)
MCH: 32.5 pg (ref 26.0–34.0)
MCHC: 31.5 g/dL (ref 30.0–36.0)
MCV: 103.4 fL — ABNORMAL HIGH (ref 80.0–100.0)
Monocytes Absolute: 0.4 10*3/uL (ref 0.1–1.0)
Monocytes Relative: 6 %
Neutro Abs: 4.9 10*3/uL (ref 1.7–7.7)
Neutrophils Relative %: 72 %
Platelets: 160 10*3/uL (ref 150–400)
RBC: 2.95 MIL/uL — ABNORMAL LOW (ref 3.87–5.11)
RDW: 12.8 % (ref 11.5–15.5)
WBC: 6.8 10*3/uL (ref 4.0–10.5)
nRBC: 0 % (ref 0.0–0.2)

## 2020-01-24 LAB — BASIC METABOLIC PANEL
Anion gap: 7 (ref 5–15)
BUN: 28 mg/dL — ABNORMAL HIGH (ref 8–23)
CO2: 25 mmol/L (ref 22–32)
Calcium: 9.1 mg/dL (ref 8.9–10.3)
Chloride: 108 mmol/L (ref 98–111)
Creatinine, Ser: 1.75 mg/dL — ABNORMAL HIGH (ref 0.44–1.00)
GFR calc Af Amer: 29 mL/min — ABNORMAL LOW (ref >60)
GFR calc non Af Amer: 25 mL/min — ABNORMAL LOW (ref >60)
Glucose, Bld: 156 mg/dL — ABNORMAL HIGH (ref 70–99)
Potassium: 4.5 mmol/L (ref 3.5–5.1)
Sodium: 140 mmol/L (ref 135–145)

## 2020-01-24 LAB — TROPONIN I (HIGH SENSITIVITY)
Troponin I (High Sensitivity): 14 ng/L (ref <18)
Troponin I (High Sensitivity): 12 ng/L (ref <18)

## 2020-01-24 LAB — MAGNESIUM: Magnesium: 1.8 mg/dL (ref 1.7–2.4)

## 2020-01-24 LAB — SARS CORONAVIRUS 2 BY RT PCR (HOSPITAL ORDER, PERFORMED IN ~~LOC~~ HOSPITAL LAB): SARS Coronavirus 2: NEGATIVE

## 2020-01-24 NOTE — Discharge Instructions (Signed)
Your work-up today showed that your heart rate is a little slow.  Also suggestive that you may be dehydrated and you might have a UTI.  We will culture the urine to see if it grows any bacteria and you will receive a phone call if this is the case.  You may need to be started on antibiotics for this.  The cardiologist saw you and recommends stopping the medication called carvedilol 3.125 mg tablets.  Please follow-up with your primary care provider or cardiologist for reevaluation of symptoms.    Return to the emergency department if any concerning signs or symptoms develop such as recurrent loss of consciousness, fevers, weakness, vomiting, or pain.

## 2020-01-24 NOTE — Consult Note (Addendum)
Cardiology Consultation:   Patient ID: [name]; [id number]; [birth year]   Admit date: 01/24/2020 Date of Consult: 01/24/2020  Primary Care Provider: Richarda Osmond, DO Primary Cardiologist: Pixie Casino, MD 02/24/2017 Primary Electrophysiologist:  None   Patient Profile:   [name] is a 86 y.o. female with a hx of Afib, LBBB, EF 40-45% echo 07/2011>>60-65% 09/2011, CKD III, renal neoplasm on surveillance, D-CHF, COPD, DM, HTN, HLD, who is being seen today for the evaluation of syncope and bradycardia at the request of Dr Sherry Ruffing.  History of Present Illness:   [name] has dementia and is A&O x 1 at baseline. She had a syncopal episode on the toilet and was hypotensive and weak afterwards. She was brought to the ER and cards asked to see.   [name] was sleeping when I am in the room, but roused easily to verbal. She responds to her name and she responds to questions, but the answers are not responsive and she is not able to provide any information. None of her family or home staff are in the room. Information was obtained from ER staff and chart notes.  According to the notes, after BM, she fell forward, but landed on her mattress. Most of her body remained on the commode. She regained consciousness and just a few seconds but was weak. Initial blood pressure 82/40.  EMS was called and she got a 250 cc saline bolus.  Currently, she is resting comfortably and does not want the blankets moved because it makes her cold. I was not able to get lung sounds, but was otherwise able to complete an exam.   Past Medical History:  Diagnosis Date   A-fib ([place])    Anemia    anemia of renal disease   Arthritis    mutiple joints   Chronic diastolic CHF (congestive heart failure) ([place]) 01/22/2010   EF dropped to 40-45% in 2013, but improved   Chronic kidney disease    Stage III- followed by [facility]   COPD (chronic obstructive pulmonary disease) ([place])    Dementia,  vascular, mixed, with behavioral disturbance ([place]) 01/31/2016   Diabetes mellitus    GERD (gastroesophageal reflux disease)    H/O hiatal hernia    Hyperlipidemia    Hypertension    Left bundle branch block    NEOPLASM, KIDNEY 03/04/2007   complex right renal cyst; Per patient- Urologist no longer following- no changes.       Nonischemic cardiomyopathy ([place])    Peripheral neuropathy    Pneumonia    Venous insufficiency 02/24/2011    Past Surgical History:  Procedure Laterality Date   ABDOMINAL HYSTERECTOMY     CARDIAC CATHETERIZATION  08/12/2011   heavily calcified L main; LAD with no stenosis; 1st diagonal free of stenosis; L Cfx with no stenosis; OM1/2 without stenosis; PDA without stenosis; RCA without significant stenosis; non-ischemic cardiomyopathy (Dr. Kathy Breach)   CATARACT EXTRACTION W/ INTRAOCULAR LENS  IMPLANT, BILATERAL     CHOLECYSTECTOMY  1987   DILATION AND CURETTAGE OF UTERUS     HIP ARTHROPLASTY Right 03/08/2017   Procedure: ARTHROPLASTY BIPOLAR HIP (HEMIARTHROPLASTY);  Surgeon: Paralee Cancel, MD;  Location: [facility];  Service: Orthopedics;  Laterality: Right;   LEFT HEART CATHETERIZATION WITH CORONARY ANGIOGRAM N/A 08/12/2011   Procedure: LEFT HEART CATHETERIZATION WITH CORONARY ANGIOGRAM;  Surgeon: Pixie Casino, MD;  Location: [facility] CATH LAB;  Service: Cardiovascular;  Laterality: N/A;   LUMBAR LAMINECTOMY/DECOMPRESSION MICRODISCECTOMY Left 05/12/2013   Procedure:  Left Lumbar four-five laminotomy and microdiskectomy;  Surgeon: Hosie Spangle, MD;  Location: [facility];  Service: Neurosurgery;  Laterality: Left;  Left Lumbar four-five laminotomy and microdiskectomy   NM MYOCAR PERF WALL MOTION  05/2011   lexiscan myoview - fixed septal defect r/t LBBB; normal pattern of perfusion in all regions; low risk scan   TONSILLECTOMY     TRANSTHORACIC ECHOCARDIOGRAM  09/2011   EF 60-65%; MV mod-severely calcified, mildly thickened MV annulus, mild MR; LA mod dilated; RA mildly  dilated     Prior to Admission medications   Medication Sig Start Date End Date Taking? Authorizing Provider  apixaban (ELIQUIS) 2.5 MG TABS tablet Take 1 tablet (2.5 mg total) by mouth 2 (two) times daily. 09/26/19 06/22/20 Yes Anderson, Chelsey L, DO  carvedilol (COREG) 3.125 MG tablet Take 1 tablet (3.125 mg total) by mouth 2 (two) times daily with a meal. 09/26/19 06/22/20 Yes Anderson, Chelsey L, DO  cholecalciferol (VITAMIN D3) 25 MCG (1000 UNIT) tablet Take 1 tablet (1,000 Units total) by mouth daily. 09/26/19  Yes Anderson, Chelsey L, DO  mirtazapine (REMERON) 7.5 MG tablet TAKE 1 TABLET (7.5 MG TOTAL) BY MOUTH AT BEDTIME. 10/19/19  Yes Anderson, Chelsey L, DO  QUEtiapine (SEROQUEL) 50 MG tablet TAKE 2 TABLETS (100 MG TOTAL) BY MOUTH AT BEDTIME. 09/26/19 09/20/20 Yes Anderson, Chelsey L, DO  albuterol (VENTOLIN HFA) 108 (90 Base) MCG/ACT inhaler Inhale 2 puffs into the lungs every 6 (six) hours as needed for wheezing or shortness of breath. Patient not taking: Reported on 01/24/2020 04/25/19   Nancy Osmond, DO    Inpatient Medications: Scheduled Meds:  Continuous Infusions:  PRN Meds:   Allergies:   No Known Allergies  Social History:   Social History   Socioeconomic History   Marital status: Married    Spouse name: [name]    Number of children: 3   Years of education: 12   Highest education level: Not on file  Occupational History    Employer: RETIRED    Comment: CNA  Tobacco Use   Smoking status: Passive Smoke Exposure - Never Smoker   Smokeless tobacco: Never Used  Vaping Use   Vaping Use: Never used  Substance and Sexual Activity   Alcohol use: No   Drug use: No   Sexual activity: Not Currently  Other Topics Concern   Not on file  Social History Narrative   01/31/16 Dr. Emmaline Life       Patient has faith in God and plays a large part in how she deals with problems. However, does not attend church regularly.    Lives with husband, [name], who does all  driving and her pill box.       Patient has 3 children    1. [name]    2. [name]   3. [name] (deceased)    Social Determinants of Health   Financial Resource Strain:    Difficulty of Paying Living Expenses:   Food Insecurity:    Worried About Charity fundraiser in the Last Year:    Arboriculturist in the Last Year:   Transportation Needs:    Film/video editor (Medical):    Lack of Transportation (Non-Medical):   Physical Activity:    Days of Exercise per Week:    Minutes of Exercise per Session:   Stress:    Feeling of Stress :   Social Connections:    Frequency of Communication with Friends  and Family:    Frequency of Social Gatherings with Friends and Family:    Attends Religious Services:    Active Member of Clubs or Organizations:    Attends Music therapist:    Marital Status:   Intimate Partner Violence:    Fear of Current or Ex-Partner:    Emotionally Abused:    Physically Abused:    Sexually Abused:     Family History:   Family History  Problem Relation Age of Onset   Heart disease Mother    Arthritis Mother    Drug abuse Daughter    Early death Daughter    Family Status:  Family Status  Relation Name Status   Mother  (Not Specified)   Daughter  Deceased    ROS:  Please see the history of present illness.  All other ROS reviewed and negative.     Physical Exam/Data:   Vitals:   01/24/20 1102 01/24/20 1311 01/24/20 1330  BP: (!) 148/67  (!) 150/56  Pulse: 61 (!) 37 (!) 39  Resp: 18  17  SpO2: 100% 96% 98%   No intake or output data in the 24 hours ending 01/24/20 1408  Last 3 Weights 09/26/2019 04/25/2019 12/28/2018  Weight (lbs) 124 lb 2 oz 130 lb 9.6 oz 138 lb  Weight (kg) 56.303 kg 59.24 kg 62.596 kg     There is no height or weight on file to calculate BMI.   General: Frail, elderly, female in no acute distress HEENT: normal for age with very poor dentition Lymph: no adenopathy Neck: JVD -not  elevated Endocrine:  No thryomegaly Vascular: No carotid bruits; 4/4 extremity pulses 2+  Cardiac:  normal S1, S2; irregular rate and rhythm; no murmur Lungs:  clear anteriorly, no wheezing, rhonchi or rales  Abd: soft, nontender, no hepatomegaly  Ext: no edema Musculoskeletal:  No deformities, BUE and BLE strength weak but equal Skin: warm and dry  Neuro:  CNs 2-12 intact, no focal abnormalities noted Psych:  Normal affect   EKG:  The EKG was personally reviewed and demonstrates: Atrial fibrillation, heart rate 65, left bundle is old, QRS duration 143 ms 02/07/2019 ECG is read as sinus rhythm, heart rate 60 but suspect it is atrial fibrillation as well Telemetry:  Telemetry was personally reviewed and demonstrates: Atrial fibrillation with slow VR at times, heart rate 40s   CV studies:   ECHO: 08/24/2015 - Left ventricle: The cavity size was normal. There was mild    concentric hypertrophy. Systolic function was normal. The    estimated ejection fraction was in the range of 55% to 60%. Wall    motion was normal; there were no regional wall motion    abnormalities. The study is not technically sufficient to allow    evaluation of LV diastolic function.  - Mitral valve: Moderately calcified, moderately thickened annulus.  - Left atrium: The atrium was moderately dilated.  - Right ventricle: The cavity size was normal. Wall thickness was    normal. Systolic function was normal.  - Tricuspid valve: There was mild regurgitation.  - Pulmonary arteries: Systolic pressure was mildly increased. PA    peak pressure: 33 mm Hg (S).  - Inferior vena cava: The vessel was normal in size. The    respirophasic diameter changes were in the normal range (= 50%),    consistent with normal central venous pressure.  - Pericardium, extracardiac: A small pericardial effusion was    identified circumferential to the heart.  CATH: 08/12/2011 Coronary Angiographic Data: Left Main:  Heavily calcified,  but no significant stenosis Left Anterior Descending (LAD):  Large vessel with mid vessel bridging, however no significant stenosis. The vessel does extend to the apex. 1st diagonal (D1):  Large diagonal branch without significant stenosis Circumflex (LCx):  Dominant large-caliber vessel without significant stenosis 1st obtuse marginal:  Large caliber branch without significant stenosis 2nd obtuse marginal: Smaller caliber without significant stenosis  Posterior descending artery:  Apparent from the circumflex without significant stenosis Right Coronary Artery: Smaller, nondominant vessel without significant stenosis Posterior lateral branch: No significant stenosis   Impression: 1.  Calcified coronary artery artery is of large caliber without significant stenosis. 2.  Nonischemic cardiomyopathy, secondary to atrial fibrillation and dyssynchrony due to left bundle branch block.   Laboratory Data:   Chemistry Recent Labs  Lab 01/24/20 1200  NA 140  K 4.5  CL 108  CO2 25  GLUCOSE 156*  BUN 28*  CREATININE 1.75*  CALCIUM 9.1  GFRNONAA 25*  GFRAA 29*  ANIONGAP 7    Lab Results  Component Value Date   ALT 8 04/16/2019   AST 12 (L) 04/16/2019   ALKPHOS 68 04/16/2019   BILITOT 1.2 04/16/2019   Hematology Recent Labs  Lab 01/24/20 1200  WBC 6.8  RBC 2.95*  HGB 9.6*  HCT 30.5*  MCV 103.4*  MCH 32.5  MCHC 31.5  RDW 12.8  PLT 160   Cardiac Enzymes High Sensitivity Troponin:   Recent Labs  Lab 01/24/20 1200  TROPONINIHS 14      BNPNo results for input(s): BNP, PROBNP in the last 168 hours.  DDimer No results for input(s): DDIMER in the last 168 hours. TSH:  Lab Results  Component Value Date   TSH 2.92 01/31/2016   Lipids: Lab Results  Component Value Date   CHOL 151 11/17/2014   HDL 51 11/17/2014   LDLCALC 68 11/17/2014   LDLDIRECT 90 04/04/2009   TRIG 158 (H) 11/17/2014   CHOLHDL 3.0 11/17/2014   HgbA1c: Lab Results  Component Value Date   HGBA1C  5.4 09/26/2019   Magnesium: No results found for: MG   Radiology/Studies:  No results found.  Assessment and Plan:   1. Persistent atrial fibrillation: -Her heart rate is low at times in the 40s. Her only rate lowering medication is Coreg 3.125 mg twice daily. - when her heart rate was in the 40s and I was talking to her, she was asymptomatic. -She got a bit agitated, and her heart rate increased, so no chronotropic incompetence -A brief episode of syncope in the context of having a bowel movement is most likely vasovagal and no pacemaker is indicated -To keep her heart rate from dropping too low, we can discontinue the carvedilol -Monitor for symptoms, but no further evaluation is planned -At her last hospitalization in 2018, she was a DNR -Do not feel she needs to be admitted, her mental status is probably most stable in her home environment -From a cardiac standpoint, okay for discharge   Active Problems:   * No active hospital problems. *     For questions or updates, please contact [place] Please consult [email or web address] for contact info under Cardiology/STEMI.   Signed, Rosaria Ferries, PA-C  01/24/2020 2:08 PM   Agree with note by Rosaria Ferries PA-C  We were asked to see Ms. Meister for witnessed question syncope.  She is a patient of Dr. Lysbeth Penner.  She has chronic A. fib rate controlled on oral anticoagulation  with bundle branch block and normal LV function with diastolic dysfunction.  She apparently was on the commode and her caretaker witnessed her slumped over.  She did not hit her head.  She is currently alert although difficult to understand.  Her vital signs are stable.  Her EKG shows A. fib with a ventricular spots in her 40s as does her monitor.  She is on low-dose Coreg.  This point, I do not think she needs admission or any further work-up.  She is a DNR.  Okay to discharge home.  Lorretta Harp, M.D., [place], [facility], [place] [address] [place], [place]  [address]  [phone] 01/24/2020 2:30 PM

## 2020-01-24 NOTE — ED Triage Notes (Addendum)
Pt presents to ED from home after witnessed syncopal episode that occurred immediatly after BM... Pt CNA states she was on decide commode directly in front of bed. Upon syncopal episode she fell forward onto mattress, but remained on commode. She was unresponsive for several seconds, regained consciousness and was weak and hypotensive.   BG 162 BP originally 82/40, but increased to 128/88 20G RFA-- 250 cc NS HR afib- brady- family not aware of hx of AFIB and unsure if she is on blood thinners Hx: dementia and CVA  pt at baseline- A&Ox1

## 2020-01-24 NOTE — ED Provider Notes (Signed)
[facility] EMERGENCY DEPARTMENT Provider Note   CSN: [id number] Arrival date & time: 01/24/20  1049     History Chief Complaint  Patient presents with  . Loss of Consciousness   Level 5 caveat due to dementia [name] is a 86 y.o. female with history of A. fib on Eliquis, CKD, COPD, vascular dementia, diabetes mellitus, GERD, hypertension, hyperlipidemia, left bundle branch block presents brought in by EMS for evaluation after a witnessed syncopal episode.  Reportedly the patient was on the bedside commode having a large soft bowel movement when she lost consciousness very briefly for a few seconds.  This was witnessed by the patient's CNA.  She fell forward onto the mattress, did not strike her head.  She regained consciousness very quickly within a few seconds and was noted to be hypotensive and generally weak.  On EMS arrival original BP was 82/40 but increased to 128/88 with 250 cc fluid bolus.  On my assessment she is resting comfortably in bed.  She has no complaints but is very confused, oriented to person only which is her baseline.  The history is provided by medical records and the EMS personnel. The history is limited by the condition of the patient.       Past Medical History:  Diagnosis Date  . A-fib ([place])   . Anemia    anemia of renal disease  . Arthritis    mutiple joints  . Chronic diastolic CHF (congestive heart failure) ([place]) 01/22/2010   EF dropped to 40-45% in 2013, but improved  . Chronic kidney disease    Stage III- followed by [facility]  . COPD (chronic obstructive pulmonary disease) ([place])   . Dementia, vascular, mixed, with behavioral disturbance ([place]) 01/31/2016  . Diabetes mellitus   . GERD (gastroesophageal reflux disease)   . H/O hiatal hernia   . Hyperlipidemia   . Hypertension   . Left bundle branch block   . NEOPLASM, KIDNEY 03/04/2007   complex right renal cyst; Per patient- Urologist no longer following- no changes.       . Nonischemic cardiomyopathy ([place])   . Peripheral neuropathy   . Pneumonia   . Venous insufficiency 02/24/2011    Patient Active Problem List   Diagnosis Date Noted  . Counseling regarding advanced care planning and goals of care 04/25/2019  . Vasovagal syncope 10/04/2018  . Normocytic anemia 02/23/2017  . High risk medication use 02/23/2017  . Hearing impairment 12/26/2016  . Impaired mobility and ADLs 12/26/2016  . Urinary incontinence in female 12/26/2016  . Cerebellar infarct ([place]) 12/26/2016  . Falls frequently 12/26/2016  . Visual impairment in both eyes   . Ovarian cyst 10/28/2016  . DNR (do not resuscitate) 08/22/2016  . Dementia, vascular, mixed, with behavioral disturbance ([place]) 01/31/2016  . Osteoporosis 09/11/2014  . History of anemia 11/10/2011  . Atrial fibrillation ([place]) 02/06/2011  . CKD stage G3b/A2, GFR 30-44 and albumin creatinine ratio 30-299 mg/g 01/26/2007  . HLD (hyperlipidemia) 08/27/2006  . Essential hypertension 08/27/2006  . OSTEOARTHRITIS, MULTI SITES 08/27/2006    Past Surgical History:  Procedure Laterality Date  . ABDOMINAL HYSTERECTOMY    . CARDIAC CATHETERIZATION  08/12/2011   heavily calcified L main; LAD with no stenosis; 1st diagonal free of stenosis; L Cfx with no stenosis; OM1/2 without stenosis; PDA without stenosis; RCA without significant stenosis; non-ischemic cardiomyopathy (Dr. Kathy Breach)  . CATARACT EXTRACTION W/ INTRAOCULAR LENS  IMPLANT, BILATERAL    . CHOLECYSTECTOMY  1987  .  DILATION AND CURETTAGE OF UTERUS    . HIP ARTHROPLASTY Right 03/08/2017   Procedure: ARTHROPLASTY BIPOLAR HIP (HEMIARTHROPLASTY);  Surgeon: Paralee Cancel, MD;  Location: [facility]S;  Service: Orthopedics;  Laterality: Right;  . LEFT HEART CATHETERIZATION WITH CORONARY ANGIOGRAM N/A 08/12/2011   Procedure: LEFT HEART CATHETERIZATION WITH CORONARY ANGIOGRAM;  Surgeon: Pixie Casino, MD;  Location: [facility] CATH LAB;  Service: Cardiovascular;  Laterality: N/A;  .  LUMBAR LAMINECTOMY/DECOMPRESSION MICRODISCECTOMY Left 05/12/2013   Procedure: Left Lumbar four-five laminotomy and microdiskectomy;  Surgeon: Hosie Spangle, MD;  Location: [facility]URO ORS;  Service: Neurosurgery;  Laterality: Left;  Left Lumbar four-five laminotomy and microdiskectomy  . NM MYOCAR PERF WALL MOTION  05/2011   lexiscan myoview - fixed septal defect r/t LBBB; normal pattern of perfusion in all regions; low risk scan  . TONSILLECTOMY    . TRANSTHORACIC ECHOCARDIOGRAM  09/2011   EF 60-65%; MV mod-severely calcified, mildly thickened MV annulus, mild MR; LA mod dilated; RA mildly dilated     OB History   No obstetric history on file.     Family History  Problem Relation Age of Onset  . Heart disease Mother   . Arthritis Mother   . Drug abuse Daughter   . Early death Daughter     Social History   Tobacco Use  . Smoking status: Passive Smoke Exposure - Never Smoker  . Smokeless tobacco: Never Used  Vaping Use  . Vaping Use: Never used  Substance Use Topics  . Alcohol use: No  . Drug use: No    Home Medications Prior to Admission medications   Medication Sig Start Date End Date Taking? Authorizing Provider  apixaban (ELIQUIS) 2.5 MG TABS tablet Take 1 tablet (2.5 mg total) by mouth 2 (two) times daily. 09/26/19 06/22/20 Yes Anderson, Chelsey L, DO  cholecalciferol (VITAMIN D3) 25 MCG (1000 UNIT) tablet Take 1 tablet (1,000 Units total) by mouth daily. 09/26/19  Yes Anderson, Chelsey L, DO  mirtazapine (REMERON) 7.5 MG tablet TAKE 1 TABLET (7.5 MG TOTAL) BY MOUTH AT BEDTIME. 10/19/19  Yes Anderson, Chelsey L, DO  QUEtiapine (SEROQUEL) 50 MG tablet TAKE 2 TABLETS (100 MG TOTAL) BY MOUTH AT BEDTIME. 09/26/19 09/20/20 Yes Anderson, Chelsey L, DO  carvedilol (COREG) 3.125 MG tablet Take 1 tablet (3.125 mg total) by mouth 2 (two) times daily with a meal. 09/26/19 01/24/20 Yes Anderson, Chelsey L, DO  albuterol (VENTOLIN HFA) 108 (90 Base) MCG/ACT inhaler Inhale 2 puffs into the  lungs every 6 (six) hours as needed for wheezing or shortness of breath. Patient not taking: Reported on 01/24/2020 04/25/19   Doristine Mango L, DO    Allergies    Patient has no known allergies.  Review of Systems   Review of Systems  Unable to perform ROS: Dementia    Physical Exam Updated Vital Signs BP (!) 172/72 (BP Location: Right Arm)   Pulse 51   Resp 17   SpO2 100%   Physical Exam Vitals and nursing note reviewed.  Constitutional:      General: She is not in acute distress.    Appearance: She is well-developed.  HENT:     Head: Normocephalic and atraumatic.  Eyes:     General:        Right eye: No discharge.        Left eye: No discharge.     Conjunctiva/sclera: Conjunctivae normal.  Neck:     Vascular: No JVD.     Trachea: No tracheal deviation.  Cardiovascular:     Rate and Rhythm: Normal rate. Rhythm irregular.     Pulses: Normal pulses.  Pulmonary:     Effort: Pulmonary effort is normal.     Breath sounds: Normal breath sounds.  Abdominal:     General: There is no distension.     Palpations: Abdomen is soft.     Tenderness: There is no abdominal tenderness. There is no guarding or rebound.  Musculoskeletal:     Cervical back: Neck supple. No tenderness.  Skin:    General: Skin is warm and dry.     Findings: No erythema.  Neurological:     Mental Status: She is alert.     GCS: GCS eye subscore is 4. GCS verbal subscore is 4. GCS motor subscore is 5.     Cranial Nerves: Cranial nerves are intact.     Comments: Moves all extremities spontaneously without difficulty.  No facial droop noted.  Patient is confused, does not follow commands.  Can answer some yes or no question  Psychiatric:        Behavior: Behavior normal.     ED Results / Procedures / Treatments   Labs (all labs ordered are listed, but only abnormal results are displayed) Labs Reviewed  BASIC METABOLIC PANEL - Abnormal; Notable for the following components:      Result Value    Glucose, Bld 156 (*)    BUN 28 (*)    Creatinine, Ser 1.75 (*)    GFR calc non Af Amer 25 (*)    GFR calc Af Amer 29 (*)    All other components within normal limits  CBC WITH DIFFERENTIAL/PLATELET - Abnormal; Notable for the following components:   RBC 2.95 (*)    Hemoglobin 9.6 (*)    HCT 30.5 (*)    MCV 103.4 (*)    All other components within normal limits  URINALYSIS, ROUTINE W REFLEX MICROSCOPIC - Abnormal; Notable for the following components:   Nitrite POSITIVE (*)    Leukocytes,Ua SMALL (*)    Bacteria, UA FEW (*)    All other components within normal limits  SARS CORONAVIRUS 2 BY RT PCR (HOSPITAL ORDER, [facility])  URINE CULTURE  MAGNESIUM  TROPONIN I (HIGH SENSITIVITY)  TROPONIN I (HIGH SENSITIVITY)    EKG EKG Interpretation  Date/Time:  Tuesday January 24 2020 10:59:37 EDT Ventricular Rate:  65 PR Interval:    QRS Duration: 143 QT Interval:  494 QTC Calculation: 514 R Axis:   45 Text Interpretation: Atrial fibrillation IVCD, consider atypical LBBB When compared to prior, similar afib to prior. No STEMI Confirmed by Antony Blackbird [id number]) on 01/24/2020 12:10:01 PM   Radiology No results found.  Procedures Procedures (including critical care time)  Medications Ordered in ED Medications - No data to display  ED Course  I have reviewed the triage vital signs and the nursing notes.  Pertinent labs & imaging results that were available during my care of the patient were reviewed by me and considered in my medical decision making (see chart for details).    MDM Rules/Calculators/A&P                          Patient brought in by EMS for evaluation after syncopal episode while having a bowel movement on the commode.  Lost consciousness very briefly and when she regained consciousness was noted to be hypotensive and generally weak with no focal deficits.  She improved with IV fluid bolus.  On my assessment she is resting comfortably,  alert with no focal neurologic deficits.  She is at her mental status baseline which is oriented to person only.  Doubt intracranial hemorrhage, CVA, seizure.  Suspect likely vasovagal syncope, however considered more concerning etiologies of high risk syncope in the setting of her age, hypotension, and history of CHF.  Cannot clear via the [facility] syncope criteria.  Cardiology was emergently consulted and will evaluate the patient in the ED.  EKG shows atrial fibrillation.  While in the ED the patient had intermittent episodes in which her heart rate would slow down as low as the 30s.  However during this time she remained alert and overall asymptomatic.  Lab work reviewed and interpreted by myself shows no leukocytosis, anemia with hemoglobin of 9.6, renal insufficiency mildly worsening compared to baseline which could suggest dehydration.  Her UA is concerning for possible UTI.  We will hold off on treatment at this time and elect to culture and follow-up on results.  I did speak with the patient's husband on the phone and relayed these recommendations and he is in agreement with plan.  Cardiology has seen and evaluate the patient emergently in the ED and feels that the syncopal episode is most consistent with vasovagal syncope.  They note that she is not symptomatic when she is bradycardic and does not meet criteria for a pacemaker.  Serial troponins in the ED have been negative.  Her magnesium is within normal limits.  No evidence of MI at this time.  They recommend discontinuing her carvedilol and following up outpatient for reevaluation.  I relayed these recommendations to the patient's husband who verbalized understanding of and agreement with this plan.  The patient is hemodynamically stable for discharge at this time.  Patient seen and evaluate by Dr. Sherry Ruffing who agrees with assessment and plan at this time.    Final Clinical Impression(s) / ED Diagnoses Final diagnoses:  Syncope and  collapse  Dehydration  Bradycardia    Rx / DC Orders ED Discharge Orders    None       Renita Papa, PA-C 01/24/20 1609    Tegeler, Gwenyth Allegra, MD 01/25/20 1030

## 2020-01-24 NOTE — ED Notes (Signed)
AVS reviewed with pt husband who verbalized understanding. PT wheeled out of dept and put in husbands car. Husband reported having no further questions.

## 2020-01-25 DIAGNOSIS — I4891 Unspecified atrial fibrillation: Secondary | ICD-10-CM | POA: Diagnosis not present

## 2020-01-25 DIAGNOSIS — I639 Cerebral infarction, unspecified: Secondary | ICD-10-CM | POA: Diagnosis not present

## 2020-01-26 DIAGNOSIS — I4891 Unspecified atrial fibrillation: Secondary | ICD-10-CM | POA: Diagnosis not present

## 2020-01-26 DIAGNOSIS — I639 Cerebral infarction, unspecified: Secondary | ICD-10-CM | POA: Diagnosis not present

## 2020-01-26 LAB — URINE CULTURE: Culture: >=100000 — AB

## 2020-01-27 ENCOUNTER — Telehealth: Payer: Self-pay

## 2020-01-27 DIAGNOSIS — I639 Cerebral infarction, unspecified: Secondary | ICD-10-CM | POA: Diagnosis not present

## 2020-01-27 DIAGNOSIS — I4891 Unspecified atrial fibrillation: Secondary | ICD-10-CM | POA: Diagnosis not present

## 2020-01-27 NOTE — Progress Notes (Signed)
ED Antimicrobial Stewardship Positive Culture Follow Up   [name] is an 86 y.o. female who presented to [facility] on 01/24/2020 with a chief complaint of  Chief Complaint  Patient presents with  . Loss of Consciousness    Recent Results (from the past 720 hour(s))  Urine culture     Status: Abnormal   Collection Time: 01/24/20  2:30 PM   Specimen: Urine, Random  Result Value Ref Range Status   Specimen Description URINE, RANDOM  Final   Special Requests   Final    NONE Performed at [facility], [address] [place], [place] [address]    Culture >=100,000 COLONIES/mL ESCHERICHIA COLI (A)  Final   Report Status 01/26/2020 FINAL  Final   Organism ID, Bacteria ESCHERICHIA COLI (A)  Final      Susceptibility   Escherichia coli - MIC*    AMPICILLIN <=2 SENSITIVE Sensitive     CEFAZOLIN <=4 SENSITIVE Sensitive     CEFTRIAXONE <=0.25 SENSITIVE Sensitive     CIPROFLOXACIN <=0.25 SENSITIVE Sensitive     GENTAMICIN <=1 SENSITIVE Sensitive     IMIPENEM <=0.25 SENSITIVE Sensitive     NITROFURANTOIN <=16 SENSITIVE Sensitive     TRIMETH/SULFA <=20 SENSITIVE Sensitive     AMPICILLIN/SULBACTAM <=2 SENSITIVE Sensitive     PIP/TAZO <=4 SENSITIVE Sensitive     * >=100,000 COLONIES/mL ESCHERICHIA COLI  SARS Coronavirus 2 by RT PCR (hospital order, performed in [facility] hospital lab) Nasopharyngeal     Status: None   Collection Time: 01/24/20  2:34 PM   Specimen: Nasopharyngeal  Result Value Ref Range Status   SARS Coronavirus 2 NEGATIVE NEGATIVE Final    Comment: (NOTE) SARS-CoV-2 target nucleic acids are NOT DETECTED.  The SARS-CoV-2 RNA is generally detectable in upper and lower respiratory specimens during the acute phase of infection. The lowest concentration of SARS-CoV-2 viral copies this assay can detect is 250 copies / mL. A negative result does not preclude SARS-CoV-2 infection and should not be used as the sole basis for treatment or other patient management  decisions.  A negative result may occur with improper specimen collection / handling, submission of specimen other than nasopharyngeal swab, presence of viral mutation(s) within the areas targeted by this assay, and inadequate number of viral copies (<250 copies / mL). A negative result must be combined with clinical observations, patient history, and epidemiological information.  Fact Sheet for Patients:   StrictlyIdeas.no  Fact Sheet for Healthcare Providers: BankingDealers.co.za  This test is not yet approved or  cleared by the [place] FDA and has been authorized for detection and/or diagnosis of SARS-CoV-2 by FDA under an Emergency Use Authorization (EUA).  This EUA will remain in effect (meaning this test can be used) for the duration of the COVID-19 declaration under Section 564(b)(1) of the Act, 21 U.S.C. section 360bbb-3(b)(1), unless the authorization is terminated or revoked sooner.  Performed at [facility], [place] [address], [place],  [address]     []  Treated with N/A, organism resistant to prescribed antimicrobial [x]  Patient discharged originally without antimicrobial agent and treatment is now indicated  New antibiotic prescription: cephalexin 500mg  PO BID x 5 day  ED Provider: Shelby Dubin, PA-C   [name], [name] 01/27/2020, 9:14 AM Clinical Pharmacist Monday - Friday phone -  [phone] Saturday - Sunday phone - [phone]

## 2020-01-27 NOTE — Telephone Encounter (Signed)
Post ED Visit - Positive Culture Follow-up: Unsuccessful Patient Follow-up  Culture assessed and recommendations reviewed by:  []  Elenor Quinones, Pharm.D. []  Heide Guile, Pharm.D., BCPS AQ-ID []  Parks Neptune, Pharm.D., BCPS []  Alycia Rossetti, Pharm.D., BCPS []  [place], Pharm.D., BCPS, AAHIVP []  Legrand Como, Pharm.D., BCPS, AAHIVP []  Wynell Balloon, PharmD []  Vincenza Hews, PharmD, BCPS Rachle Rumbarger Pharm D Positive urine culture Needs Keflex 500 mg BID x 5 days per [name] [facility] [x]  Patient discharged without antimicrobial prescription and treatment is now indicated []  Organism is resistant to prescribed ED discharge antimicrobial []  Patient with positive blood cultures   Unable to contact patient after 3 attempts, letter will be sent to address on file  [name] 01/27/2020, 9:52 AM

## 2020-01-28 DIAGNOSIS — I639 Cerebral infarction, unspecified: Secondary | ICD-10-CM | POA: Diagnosis not present

## 2020-01-28 DIAGNOSIS — I4891 Unspecified atrial fibrillation: Secondary | ICD-10-CM | POA: Diagnosis not present

## 2020-01-30 DIAGNOSIS — I4891 Unspecified atrial fibrillation: Secondary | ICD-10-CM | POA: Diagnosis not present

## 2020-01-30 DIAGNOSIS — I639 Cerebral infarction, unspecified: Secondary | ICD-10-CM | POA: Diagnosis not present

## 2020-02-01 ENCOUNTER — Telehealth: Payer: Self-pay | Admitting: Emergency Medicine

## 2020-02-01 DIAGNOSIS — I639 Cerebral infarction, unspecified: Secondary | ICD-10-CM | POA: Diagnosis not present

## 2020-02-01 DIAGNOSIS — I4891 Unspecified atrial fibrillation: Secondary | ICD-10-CM | POA: Diagnosis not present

## 2020-02-02 DIAGNOSIS — I4891 Unspecified atrial fibrillation: Secondary | ICD-10-CM | POA: Diagnosis not present

## 2020-02-02 DIAGNOSIS — I639 Cerebral infarction, unspecified: Secondary | ICD-10-CM | POA: Diagnosis not present

## 2020-02-02 DIAGNOSIS — S329XXA Fracture of unspecified parts of lumbosacral spine and pelvis, initial encounter for closed fracture: Secondary | ICD-10-CM | POA: Diagnosis not present

## 2020-02-02 DIAGNOSIS — M353 Polymyalgia rheumatica: Secondary | ICD-10-CM | POA: Diagnosis not present

## 2020-02-03 DIAGNOSIS — I639 Cerebral infarction, unspecified: Secondary | ICD-10-CM | POA: Diagnosis not present

## 2020-02-03 DIAGNOSIS — I4891 Unspecified atrial fibrillation: Secondary | ICD-10-CM | POA: Diagnosis not present

## 2020-02-05 DIAGNOSIS — I639 Cerebral infarction, unspecified: Secondary | ICD-10-CM | POA: Diagnosis not present

## 2020-02-05 DIAGNOSIS — I4891 Unspecified atrial fibrillation: Secondary | ICD-10-CM | POA: Diagnosis not present

## 2020-02-06 DIAGNOSIS — I639 Cerebral infarction, unspecified: Secondary | ICD-10-CM | POA: Diagnosis not present

## 2020-02-06 DIAGNOSIS — I4891 Unspecified atrial fibrillation: Secondary | ICD-10-CM | POA: Diagnosis not present

## 2020-02-07 DIAGNOSIS — I639 Cerebral infarction, unspecified: Secondary | ICD-10-CM | POA: Diagnosis not present

## 2020-02-07 DIAGNOSIS — I4891 Unspecified atrial fibrillation: Secondary | ICD-10-CM | POA: Diagnosis not present

## 2020-02-13 DIAGNOSIS — E119 Type 2 diabetes mellitus without complications: Secondary | ICD-10-CM | POA: Diagnosis not present

## 2020-02-13 DIAGNOSIS — H04123 Dry eye syndrome of bilateral lacrimal glands: Secondary | ICD-10-CM | POA: Diagnosis not present

## 2020-02-13 DIAGNOSIS — H18413 Arcus senilis, bilateral: Secondary | ICD-10-CM | POA: Diagnosis not present

## 2020-02-13 DIAGNOSIS — Z961 Presence of intraocular lens: Secondary | ICD-10-CM | POA: Diagnosis not present

## 2020-02-13 DIAGNOSIS — I4891 Unspecified atrial fibrillation: Secondary | ICD-10-CM | POA: Diagnosis not present

## 2020-02-13 DIAGNOSIS — I639 Cerebral infarction, unspecified: Secondary | ICD-10-CM | POA: Diagnosis not present

## 2020-02-14 DIAGNOSIS — I4891 Unspecified atrial fibrillation: Secondary | ICD-10-CM | POA: Diagnosis not present

## 2020-02-14 DIAGNOSIS — I639 Cerebral infarction, unspecified: Secondary | ICD-10-CM | POA: Diagnosis not present

## 2020-02-15 ENCOUNTER — Ambulatory Visit: Payer: Medicare Other | Admitting: Student in an Organized Health Care Education/Training Program

## 2020-02-15 DIAGNOSIS — I639 Cerebral infarction, unspecified: Secondary | ICD-10-CM | POA: Diagnosis not present

## 2020-02-15 DIAGNOSIS — I4891 Unspecified atrial fibrillation: Secondary | ICD-10-CM | POA: Diagnosis not present

## 2020-02-16 DIAGNOSIS — I4891 Unspecified atrial fibrillation: Secondary | ICD-10-CM | POA: Diagnosis not present

## 2020-02-16 DIAGNOSIS — I639 Cerebral infarction, unspecified: Secondary | ICD-10-CM | POA: Diagnosis not present

## 2020-02-17 DIAGNOSIS — I639 Cerebral infarction, unspecified: Secondary | ICD-10-CM | POA: Diagnosis not present

## 2020-02-17 DIAGNOSIS — I4891 Unspecified atrial fibrillation: Secondary | ICD-10-CM | POA: Diagnosis not present

## 2020-02-19 DIAGNOSIS — I4891 Unspecified atrial fibrillation: Secondary | ICD-10-CM | POA: Diagnosis not present

## 2020-02-19 DIAGNOSIS — I639 Cerebral infarction, unspecified: Secondary | ICD-10-CM | POA: Diagnosis not present

## 2020-02-20 DIAGNOSIS — I4891 Unspecified atrial fibrillation: Secondary | ICD-10-CM | POA: Diagnosis not present

## 2020-02-20 DIAGNOSIS — I639 Cerebral infarction, unspecified: Secondary | ICD-10-CM | POA: Diagnosis not present

## 2020-02-21 DIAGNOSIS — I4891 Unspecified atrial fibrillation: Secondary | ICD-10-CM | POA: Diagnosis not present

## 2020-02-21 DIAGNOSIS — I639 Cerebral infarction, unspecified: Secondary | ICD-10-CM | POA: Diagnosis not present

## 2020-02-22 DIAGNOSIS — I4891 Unspecified atrial fibrillation: Secondary | ICD-10-CM | POA: Diagnosis not present

## 2020-02-22 DIAGNOSIS — I639 Cerebral infarction, unspecified: Secondary | ICD-10-CM | POA: Diagnosis not present

## 2020-02-23 DIAGNOSIS — I4891 Unspecified atrial fibrillation: Secondary | ICD-10-CM | POA: Diagnosis not present

## 2020-02-23 DIAGNOSIS — I639 Cerebral infarction, unspecified: Secondary | ICD-10-CM | POA: Diagnosis not present

## 2020-02-24 DIAGNOSIS — I4891 Unspecified atrial fibrillation: Secondary | ICD-10-CM | POA: Diagnosis not present

## 2020-02-24 DIAGNOSIS — I639 Cerebral infarction, unspecified: Secondary | ICD-10-CM | POA: Diagnosis not present

## 2020-02-26 DIAGNOSIS — I639 Cerebral infarction, unspecified: Secondary | ICD-10-CM | POA: Diagnosis not present

## 2020-02-26 DIAGNOSIS — I4891 Unspecified atrial fibrillation: Secondary | ICD-10-CM | POA: Diagnosis not present

## 2020-02-27 DIAGNOSIS — I639 Cerebral infarction, unspecified: Secondary | ICD-10-CM | POA: Diagnosis not present

## 2020-02-27 DIAGNOSIS — I4891 Unspecified atrial fibrillation: Secondary | ICD-10-CM | POA: Diagnosis not present

## 2020-02-28 DIAGNOSIS — I639 Cerebral infarction, unspecified: Secondary | ICD-10-CM | POA: Diagnosis not present

## 2020-02-28 DIAGNOSIS — I4891 Unspecified atrial fibrillation: Secondary | ICD-10-CM | POA: Diagnosis not present

## 2020-02-29 DIAGNOSIS — I639 Cerebral infarction, unspecified: Secondary | ICD-10-CM | POA: Diagnosis not present

## 2020-02-29 DIAGNOSIS — I4891 Unspecified atrial fibrillation: Secondary | ICD-10-CM | POA: Diagnosis not present

## 2020-03-01 DIAGNOSIS — M353 Polymyalgia rheumatica: Secondary | ICD-10-CM | POA: Diagnosis not present

## 2020-03-01 DIAGNOSIS — I639 Cerebral infarction, unspecified: Secondary | ICD-10-CM | POA: Diagnosis not present

## 2020-03-01 DIAGNOSIS — I4891 Unspecified atrial fibrillation: Secondary | ICD-10-CM | POA: Diagnosis not present

## 2020-03-01 DIAGNOSIS — S329XXA Fracture of unspecified parts of lumbosacral spine and pelvis, initial encounter for closed fracture: Secondary | ICD-10-CM | POA: Diagnosis not present

## 2020-03-02 DIAGNOSIS — I639 Cerebral infarction, unspecified: Secondary | ICD-10-CM | POA: Diagnosis not present

## 2020-03-02 DIAGNOSIS — I4891 Unspecified atrial fibrillation: Secondary | ICD-10-CM | POA: Diagnosis not present

## 2020-03-03 DIAGNOSIS — I639 Cerebral infarction, unspecified: Secondary | ICD-10-CM | POA: Diagnosis not present

## 2020-03-03 DIAGNOSIS — I4891 Unspecified atrial fibrillation: Secondary | ICD-10-CM | POA: Diagnosis not present

## 2020-03-04 DIAGNOSIS — I4891 Unspecified atrial fibrillation: Secondary | ICD-10-CM | POA: Diagnosis not present

## 2020-03-04 DIAGNOSIS — I639 Cerebral infarction, unspecified: Secondary | ICD-10-CM | POA: Diagnosis not present

## 2020-03-05 DIAGNOSIS — I639 Cerebral infarction, unspecified: Secondary | ICD-10-CM | POA: Diagnosis not present

## 2020-03-05 DIAGNOSIS — I4891 Unspecified atrial fibrillation: Secondary | ICD-10-CM | POA: Diagnosis not present

## 2020-03-06 DIAGNOSIS — I4891 Unspecified atrial fibrillation: Secondary | ICD-10-CM | POA: Diagnosis not present

## 2020-03-06 DIAGNOSIS — I639 Cerebral infarction, unspecified: Secondary | ICD-10-CM | POA: Diagnosis not present

## 2020-03-07 DIAGNOSIS — I639 Cerebral infarction, unspecified: Secondary | ICD-10-CM | POA: Diagnosis not present

## 2020-03-07 DIAGNOSIS — I4891 Unspecified atrial fibrillation: Secondary | ICD-10-CM | POA: Diagnosis not present

## 2020-03-08 DIAGNOSIS — I639 Cerebral infarction, unspecified: Secondary | ICD-10-CM | POA: Diagnosis not present

## 2020-03-08 DIAGNOSIS — I4891 Unspecified atrial fibrillation: Secondary | ICD-10-CM | POA: Diagnosis not present

## 2020-03-09 DIAGNOSIS — I639 Cerebral infarction, unspecified: Secondary | ICD-10-CM | POA: Diagnosis not present

## 2020-03-09 DIAGNOSIS — I4891 Unspecified atrial fibrillation: Secondary | ICD-10-CM | POA: Diagnosis not present

## 2020-03-11 DIAGNOSIS — I4891 Unspecified atrial fibrillation: Secondary | ICD-10-CM | POA: Diagnosis not present

## 2020-03-11 DIAGNOSIS — I639 Cerebral infarction, unspecified: Secondary | ICD-10-CM | POA: Diagnosis not present

## 2020-03-12 DIAGNOSIS — I4891 Unspecified atrial fibrillation: Secondary | ICD-10-CM | POA: Diagnosis not present

## 2020-03-12 DIAGNOSIS — I639 Cerebral infarction, unspecified: Secondary | ICD-10-CM | POA: Diagnosis not present

## 2020-03-13 ENCOUNTER — Ambulatory Visit (INDEPENDENT_AMBULATORY_CARE_PROVIDER_SITE_OTHER): Payer: Medicare Other | Admitting: Student in an Organized Health Care Education/Training Program

## 2020-03-13 ENCOUNTER — Other Ambulatory Visit: Payer: Self-pay

## 2020-03-13 DIAGNOSIS — N1832 Chronic kidney disease, stage 3b: Secondary | ICD-10-CM | POA: Diagnosis not present

## 2020-03-13 DIAGNOSIS — I4891 Unspecified atrial fibrillation: Secondary | ICD-10-CM | POA: Diagnosis not present

## 2020-03-13 DIAGNOSIS — F0151 Vascular dementia with behavioral disturbance: Secondary | ICD-10-CM | POA: Diagnosis not present

## 2020-03-13 DIAGNOSIS — L989 Disorder of the skin and subcutaneous tissue, unspecified: Secondary | ICD-10-CM | POA: Diagnosis not present

## 2020-03-13 DIAGNOSIS — F01518 Vascular dementia, unspecified severity, with other behavioral disturbance: Secondary | ICD-10-CM

## 2020-03-13 DIAGNOSIS — I639 Cerebral infarction, unspecified: Secondary | ICD-10-CM | POA: Diagnosis not present

## 2020-03-13 MED ORDER — ALBUTEROL SULFATE (2.5 MG/3ML) 0.083% IN NEBU: 2.5000 mg | INHALATION_SOLUTION | Freq: Four times a day (QID) | RESPIRATORY_TRACT | 1 refills | Status: AC | PRN

## 2020-03-13 MED ORDER — LUBRICANT EYE DROPS 0.4-0.3 % OP SOLN: 2.0000 [drp] | Freq: Two times a day (BID) | OPHTHALMIC | 0 refills | Status: AC

## 2020-03-13 NOTE — Progress Notes (Signed)
   SUBJECTIVE:   CHIEF COMPLAINT / HPI: f/u weight loss 109.4lbs today. Down from 124lbs 3/29. Mr Kott provides all of history due to Baylei's cognitive decline. She has become gradually but noticeably less interactive over the 2+ years that I have been caring for her. He states that she is eating well and he has been giving her the remeron nightly. He was worried about giving her too many pills at night so has not been giving her the seroquel for a couple weeks. This has impacted her sleep. Does not think the remeron helped. Mr. Bjorklund performs almost all of her care at home and they have an aid come to the house as well.  He endorses that she has been doing well since home from her ED visit 12/2019.   She has a lesion on her right cheek/chin that has been there off and on for several years and goes away with "a cream". Does not bleed. Does not seem painful to patient.  CKD- patient has poor kidney function which has been stable for the past year and was recently checked in ED. She is still making urine. Mr. Nazir states that they have been to the nephrologist for regular check ups and they have not recommended any changes.   OBJECTIVE:   BP 117/75   Pulse 76   Ht 5\' 6"  (1.676 m)   Wt 109 lb 6.4 oz (49.6 kg)   SpO2 99%   BMI 17.66 kg/m   General: NAD, generalized muscle wasting  Cardiac: RRR, normal heart sounds, no murmurs. 2+ radial and PT pulses bilaterally Respiratory: CTAB, normal effort, No wheezes, rales or rhonchi Abdomen: soft, nontender, nondistended, no hepatic or splenomegaly, +BS Extremities: no edema or cyanosis. WWP. Skin: warm and dry, no rashes noted. Non-painful lesion to left chin (please see clinical image for further detail) Neuro: alert and responds to conversation with non-comprehensible speech Psych: Normal affect and mood   ASSESSMENT/PLAN:   Dementia, vascular, mixed, with behavioral disturbance Worsening gradually. Caregiver expresses that he has  advanced directive in place and at this time, he feels adequate to care for her at home with the help of the aid. We had a frank discussion about Mrs. Romick has been worsening and weight loss is significant. I offered to connect him with resources for palliative care and social work for community resources to help with caring for patient with advanced dementia. He is interested in these services in the future but declines the referrals at this time. He will let me know if he needs them. He thinks that the remeron has not improving her appetite and she is still losing weight so we will discontinue today.  Facial skin lesion Lesion is concerning for squamous cell carcinoma vs basal cell carcinoma.  Discussed this concern with Mr. Pfefferle and my recommendation to biopsy the lesion at dermatology clinic.  He declined the biopsy and instead would like to continue to put on the "cream" that has bad it resolve in the past. Asked him to follow up in 3 months so I can reassess.  CKD stage G3b/A2, GFR 30-44 and albumin creatinine ratio 30-299 mg/g Kidney function stable per recent labs drawn from ED. Continue to follow with nephrology.  Likely repeat labs for monitoring at next visit in 3 months     Richarda Osmond, [place]

## 2020-03-13 NOTE — Patient Instructions (Signed)
It was a pleasure to see you today!  To summarize our discussion for this visit:  I have sent in lubricating eye drops to your local pharmacy and the mail-in pharmacy. Please follow up with your eye doctor to see if there was a different kind of eye drops they wanted you to have.  I have also sent in the nebulizer treatment solution.  Since Mazie has a good appetite and you do not think the remeron is helping. You can discontinue this medication.   I would like to help you plan for Ms. Bettys care in the future since she is likely going to be requiring more and more help that may be overwhelming. Let me know if when you would like to hear more about this.   Some additional health maintenance measures we should update are: Health Maintenance Due  Topic Date Due   COVID-19 Vaccine (1) Never done   FOOT EXAM  08/14/2018   OPHTHALMOLOGY EXAM  12/19/2018   INFLUENZA VACCINE  01/29/2020     Please return to our clinic to see me in about 3 months.  Call the clinic at [phone] if your symptoms worsen or you have any concerns.   Thank you for allowing me to take part in your care,  Dr. Doristine Mango

## 2020-03-14 DIAGNOSIS — I639 Cerebral infarction, unspecified: Secondary | ICD-10-CM | POA: Diagnosis not present

## 2020-03-14 DIAGNOSIS — I4891 Unspecified atrial fibrillation: Secondary | ICD-10-CM | POA: Diagnosis not present

## 2020-03-14 NOTE — Assessment & Plan Note (Signed)
Worsening gradually. Caregiver expresses that he has advanced directive in place and at this time, he feels adequate to care for her at home with the help of the aid. We had a frank discussion about [name] has been worsening and weight loss is significant. I offered to connect him with resources for palliative care and social work for community resources to help with caring for patient with advanced dementia. He is interested in these services in the future but declines the referrals at this time. He will let me know if he needs them. He thinks that the remeron has not improving her appetite and she is still losing weight so we will discontinue today.

## 2020-03-14 NOTE — Assessment & Plan Note (Signed)
Kidney function stable per recent labs drawn from ED. Continue to follow with nephrology.  Likely repeat labs for monitoring at next visit in 3 months

## 2020-03-14 NOTE — Assessment & Plan Note (Signed)
Lesion is concerning for squamous cell carcinoma vs basal cell carcinoma.  Discussed this concern with [name] and my recommendation to biopsy the lesion at dermatology clinic.  He declined the biopsy and instead would like to continue to put on the "cream" that has bad it resolve in the past. Asked him to follow up in 3 months so I can reassess.

## 2020-03-15 DIAGNOSIS — I639 Cerebral infarction, unspecified: Secondary | ICD-10-CM | POA: Diagnosis not present

## 2020-03-15 DIAGNOSIS — I4891 Unspecified atrial fibrillation: Secondary | ICD-10-CM | POA: Diagnosis not present

## 2020-03-16 ENCOUNTER — Ambulatory Visit: Payer: Medicare Other | Admitting: Student in an Organized Health Care Education/Training Program

## 2020-03-16 DIAGNOSIS — I4891 Unspecified atrial fibrillation: Secondary | ICD-10-CM | POA: Diagnosis not present

## 2020-03-16 DIAGNOSIS — I639 Cerebral infarction, unspecified: Secondary | ICD-10-CM | POA: Diagnosis not present

## 2020-03-17 DIAGNOSIS — I4891 Unspecified atrial fibrillation: Secondary | ICD-10-CM | POA: Diagnosis not present

## 2020-03-17 DIAGNOSIS — I639 Cerebral infarction, unspecified: Secondary | ICD-10-CM | POA: Diagnosis not present

## 2020-03-18 DIAGNOSIS — I639 Cerebral infarction, unspecified: Secondary | ICD-10-CM | POA: Diagnosis not present

## 2020-03-18 DIAGNOSIS — I4891 Unspecified atrial fibrillation: Secondary | ICD-10-CM | POA: Diagnosis not present

## 2020-03-19 DIAGNOSIS — I4891 Unspecified atrial fibrillation: Secondary | ICD-10-CM | POA: Diagnosis not present

## 2020-03-19 DIAGNOSIS — I639 Cerebral infarction, unspecified: Secondary | ICD-10-CM | POA: Diagnosis not present

## 2020-03-20 DIAGNOSIS — I639 Cerebral infarction, unspecified: Secondary | ICD-10-CM | POA: Diagnosis not present

## 2020-03-20 DIAGNOSIS — I4891 Unspecified atrial fibrillation: Secondary | ICD-10-CM | POA: Diagnosis not present

## 2020-03-21 DIAGNOSIS — I639 Cerebral infarction, unspecified: Secondary | ICD-10-CM | POA: Diagnosis not present

## 2020-03-21 DIAGNOSIS — I4891 Unspecified atrial fibrillation: Secondary | ICD-10-CM | POA: Diagnosis not present

## 2020-03-23 DIAGNOSIS — I639 Cerebral infarction, unspecified: Secondary | ICD-10-CM | POA: Diagnosis not present

## 2020-03-23 DIAGNOSIS — I4891 Unspecified atrial fibrillation: Secondary | ICD-10-CM | POA: Diagnosis not present

## 2020-03-26 DIAGNOSIS — I639 Cerebral infarction, unspecified: Secondary | ICD-10-CM | POA: Diagnosis not present

## 2020-03-26 DIAGNOSIS — I4891 Unspecified atrial fibrillation: Secondary | ICD-10-CM | POA: Diagnosis not present

## 2020-04-01 DIAGNOSIS — I4891 Unspecified atrial fibrillation: Secondary | ICD-10-CM | POA: Diagnosis not present

## 2020-04-01 DIAGNOSIS — I639 Cerebral infarction, unspecified: Secondary | ICD-10-CM | POA: Diagnosis not present

## 2020-04-04 DIAGNOSIS — I639 Cerebral infarction, unspecified: Secondary | ICD-10-CM | POA: Diagnosis not present

## 2020-04-04 DIAGNOSIS — I4891 Unspecified atrial fibrillation: Secondary | ICD-10-CM | POA: Diagnosis not present

## 2020-04-05 DIAGNOSIS — I639 Cerebral infarction, unspecified: Secondary | ICD-10-CM | POA: Diagnosis not present

## 2020-04-05 DIAGNOSIS — I4891 Unspecified atrial fibrillation: Secondary | ICD-10-CM | POA: Diagnosis not present

## 2020-04-06 DIAGNOSIS — I639 Cerebral infarction, unspecified: Secondary | ICD-10-CM | POA: Diagnosis not present

## 2020-04-06 DIAGNOSIS — I4891 Unspecified atrial fibrillation: Secondary | ICD-10-CM | POA: Diagnosis not present

## 2020-04-07 DIAGNOSIS — I639 Cerebral infarction, unspecified: Secondary | ICD-10-CM | POA: Diagnosis not present

## 2020-04-07 DIAGNOSIS — I4891 Unspecified atrial fibrillation: Secondary | ICD-10-CM | POA: Diagnosis not present

## 2020-04-08 DIAGNOSIS — I4891 Unspecified atrial fibrillation: Secondary | ICD-10-CM | POA: Diagnosis not present

## 2020-04-08 DIAGNOSIS — I639 Cerebral infarction, unspecified: Secondary | ICD-10-CM | POA: Diagnosis not present

## 2020-04-09 DIAGNOSIS — I4891 Unspecified atrial fibrillation: Secondary | ICD-10-CM | POA: Diagnosis not present

## 2020-04-09 DIAGNOSIS — I639 Cerebral infarction, unspecified: Secondary | ICD-10-CM | POA: Diagnosis not present

## 2020-04-10 DIAGNOSIS — I4891 Unspecified atrial fibrillation: Secondary | ICD-10-CM | POA: Diagnosis not present

## 2020-04-10 DIAGNOSIS — I639 Cerebral infarction, unspecified: Secondary | ICD-10-CM | POA: Diagnosis not present

## 2020-04-15 ENCOUNTER — Other Ambulatory Visit: Payer: Self-pay | Admitting: Student in an Organized Health Care Education/Training Program

## 2020-04-16 ENCOUNTER — Other Ambulatory Visit: Payer: Self-pay

## 2020-04-16 ENCOUNTER — Encounter: Payer: Self-pay | Admitting: Student in an Organized Health Care Education/Training Program

## 2020-04-16 ENCOUNTER — Ambulatory Visit (INDEPENDENT_AMBULATORY_CARE_PROVIDER_SITE_OTHER): Payer: Medicare Other | Admitting: Student in an Organized Health Care Education/Training Program

## 2020-04-16 VITALS — BP 120/72 | HR 69 | Wt 115.8 lb

## 2020-04-16 DIAGNOSIS — M799 Soft tissue disorder, unspecified: Secondary | ICD-10-CM

## 2020-04-16 DIAGNOSIS — Z23 Encounter for immunization: Secondary | ICD-10-CM

## 2020-04-16 DIAGNOSIS — L989 Disorder of the skin and subcutaneous tissue, unspecified: Secondary | ICD-10-CM

## 2020-04-16 NOTE — Progress Notes (Signed)
    SUBJECTIVE:   CHIEF COMPLAINT / HPI: face rash and flu shot  Facial rash that has been present on face intermittently in same spot for many years. Husband has used an unknown cream on the rash that makes it go away for a period of time but it always comes back. He has ran out of the cream. Thinks it was a triple antibiotic. Requested they get a biopsy at last visit but he wanted to postpone. He is open to getting a biopsy now.   OBJECTIVE:   BP 120/72   Pulse 69   Wt 115 lb 12.8 oz (52.5 kg)   SpO2 96%   BMI 18.69 kg/m     Skin: non tender to palpation. Increased erythema and size from last visit. Can compare clinical images in chart.   ASSESSMENT/PLAN:   Soft tissue lesion On right mandibular region. Concern for SCC or basal cell.  Due to location, I think it would be best to be seen at derm clinic for biopsy or at dermatology office. Whichever can get her seen first.  In the mean time, want to prevent infection and appears that she is scratching at the lesion from the erythematous marks. Provided with triple antibiotic samples.   Flu vaccine need Administered high dose flu vaccine today     Richarda Osmond, [place]

## 2020-04-16 NOTE — Patient Instructions (Addendum)
It was a pleasure to see you today!  To summarize our discussion for this visit:  I am giving you some antibiotic cream for the facial lesion.   She should come to a dermatology clinic to get a biopsy. Please expect a call from their office to set up appointment.  Let me know if it gets worse, red, or she develops a fever  Some additional health maintenance measures we should update are: Health Maintenance Due  Topic Date Due  . COVID-19 Vaccine (1) Never done  . FOOT EXAM  08/14/2018  . OPHTHALMOLOGY EXAM  12/19/2018  . INFLUENZA VACCINE  01/29/2020  . HEMOGLOBIN A1C  03/28/2020  .    Call the clinic at [phone] if your symptoms worsen or you have any concerns.   Thank you for allowing me to take part in your care,  Dr. Doristine Mango

## 2020-04-18 DIAGNOSIS — Z23 Encounter for immunization: Secondary | ICD-10-CM | POA: Insufficient documentation

## 2020-04-18 DIAGNOSIS — M799 Soft tissue disorder, unspecified: Secondary | ICD-10-CM | POA: Insufficient documentation

## 2020-04-18 NOTE — Assessment & Plan Note (Signed)
On right mandibular region. Concern for SCC or basal cell.  Due to location, I think it would be best to be seen at derm clinic for biopsy or at dermatology office. Whichever can get her seen first.  In the mean time, want to prevent infection and appears that she is scratching at the lesion from the erythematous marks. Provided with triple antibiotic samples.

## 2020-04-18 NOTE — Assessment & Plan Note (Signed)
Administered high dose flu vaccine today

## 2020-04-21 DIAGNOSIS — I4891 Unspecified atrial fibrillation: Secondary | ICD-10-CM | POA: Diagnosis not present

## 2020-04-21 DIAGNOSIS — I639 Cerebral infarction, unspecified: Secondary | ICD-10-CM | POA: Diagnosis not present

## 2020-04-22 DIAGNOSIS — I4891 Unspecified atrial fibrillation: Secondary | ICD-10-CM | POA: Diagnosis not present

## 2020-04-22 DIAGNOSIS — I639 Cerebral infarction, unspecified: Secondary | ICD-10-CM | POA: Diagnosis not present

## 2020-04-26 DIAGNOSIS — I4891 Unspecified atrial fibrillation: Secondary | ICD-10-CM | POA: Diagnosis not present

## 2020-04-26 DIAGNOSIS — I639 Cerebral infarction, unspecified: Secondary | ICD-10-CM | POA: Diagnosis not present

## 2020-04-27 DIAGNOSIS — I4891 Unspecified atrial fibrillation: Secondary | ICD-10-CM | POA: Diagnosis not present

## 2020-04-27 DIAGNOSIS — I639 Cerebral infarction, unspecified: Secondary | ICD-10-CM | POA: Diagnosis not present

## 2020-04-29 DIAGNOSIS — I4891 Unspecified atrial fibrillation: Secondary | ICD-10-CM | POA: Diagnosis not present

## 2020-04-29 DIAGNOSIS — I639 Cerebral infarction, unspecified: Secondary | ICD-10-CM | POA: Diagnosis not present

## 2020-05-01 ENCOUNTER — Ambulatory Visit: Payer: Medicare Other | Admitting: Podiatry

## 2020-05-01 DIAGNOSIS — I639 Cerebral infarction, unspecified: Secondary | ICD-10-CM | POA: Diagnosis not present

## 2020-05-01 DIAGNOSIS — I4891 Unspecified atrial fibrillation: Secondary | ICD-10-CM | POA: Diagnosis not present

## 2020-05-02 DIAGNOSIS — I4891 Unspecified atrial fibrillation: Secondary | ICD-10-CM | POA: Diagnosis not present

## 2020-05-02 DIAGNOSIS — I639 Cerebral infarction, unspecified: Secondary | ICD-10-CM | POA: Diagnosis not present

## 2020-05-05 DIAGNOSIS — I639 Cerebral infarction, unspecified: Secondary | ICD-10-CM | POA: Diagnosis not present

## 2020-05-05 DIAGNOSIS — I4891 Unspecified atrial fibrillation: Secondary | ICD-10-CM | POA: Diagnosis not present

## 2020-05-06 DIAGNOSIS — I4891 Unspecified atrial fibrillation: Secondary | ICD-10-CM | POA: Diagnosis not present

## 2020-05-06 DIAGNOSIS — I639 Cerebral infarction, unspecified: Secondary | ICD-10-CM | POA: Diagnosis not present

## 2020-05-07 DIAGNOSIS — I639 Cerebral infarction, unspecified: Secondary | ICD-10-CM | POA: Diagnosis not present

## 2020-05-07 DIAGNOSIS — I4891 Unspecified atrial fibrillation: Secondary | ICD-10-CM | POA: Diagnosis not present

## 2020-05-08 DIAGNOSIS — I639 Cerebral infarction, unspecified: Secondary | ICD-10-CM | POA: Diagnosis not present

## 2020-05-08 DIAGNOSIS — I4891 Unspecified atrial fibrillation: Secondary | ICD-10-CM | POA: Diagnosis not present

## 2020-05-09 DIAGNOSIS — I4891 Unspecified atrial fibrillation: Secondary | ICD-10-CM | POA: Diagnosis not present

## 2020-05-09 DIAGNOSIS — I639 Cerebral infarction, unspecified: Secondary | ICD-10-CM | POA: Diagnosis not present

## 2020-05-12 ENCOUNTER — Inpatient Hospital Stay (HOSPITAL_COMMUNITY)
Admission: EM | Admit: 2020-05-12 | Discharge: 2020-05-30 | DRG: 296 | Disposition: E | Payer: Medicare Other | Attending: Pulmonary Disease | Admitting: Pulmonary Disease

## 2020-05-12 ENCOUNTER — Encounter (HOSPITAL_COMMUNITY): Payer: Self-pay

## 2020-05-12 ENCOUNTER — Emergency Department (HOSPITAL_COMMUNITY): Payer: Medicare Other

## 2020-05-12 DIAGNOSIS — E785 Hyperlipidemia, unspecified: Secondary | ICD-10-CM | POA: Diagnosis present

## 2020-05-12 DIAGNOSIS — J449 Chronic obstructive pulmonary disease, unspecified: Secondary | ICD-10-CM | POA: Diagnosis present

## 2020-05-12 DIAGNOSIS — Z7901 Long term (current) use of anticoagulants: Secondary | ICD-10-CM | POA: Diagnosis not present

## 2020-05-12 DIAGNOSIS — Z79899 Other long term (current) drug therapy: Secondary | ICD-10-CM | POA: Diagnosis not present

## 2020-05-12 DIAGNOSIS — R451 Restlessness and agitation: Secondary | ICD-10-CM | POA: Diagnosis present

## 2020-05-12 DIAGNOSIS — J9601 Acute respiratory failure with hypoxia: Secondary | ICD-10-CM | POA: Diagnosis not present

## 2020-05-12 DIAGNOSIS — I469 Cardiac arrest, cause unspecified: Principal | ICD-10-CM | POA: Diagnosis present

## 2020-05-12 DIAGNOSIS — Z743 Need for continuous supervision: Secondary | ICD-10-CM | POA: Diagnosis not present

## 2020-05-12 DIAGNOSIS — Z515 Encounter for palliative care: Secondary | ICD-10-CM | POA: Diagnosis not present

## 2020-05-12 DIAGNOSIS — F09 Unspecified mental disorder due to known physiological condition: Secondary | ICD-10-CM | POA: Diagnosis not present

## 2020-05-12 DIAGNOSIS — I639 Cerebral infarction, unspecified: Secondary | ICD-10-CM | POA: Diagnosis not present

## 2020-05-12 DIAGNOSIS — G40401 Other generalized epilepsy and epileptic syndromes, not intractable, with status epilepticus: Secondary | ICD-10-CM | POA: Diagnosis not present

## 2020-05-12 DIAGNOSIS — R410 Disorientation, unspecified: Secondary | ICD-10-CM | POA: Diagnosis not present

## 2020-05-12 DIAGNOSIS — R404 Transient alteration of awareness: Secondary | ICD-10-CM | POA: Diagnosis not present

## 2020-05-12 DIAGNOSIS — Z9911 Dependence on respirator [ventilator] status: Secondary | ICD-10-CM

## 2020-05-12 DIAGNOSIS — I959 Hypotension, unspecified: Secondary | ICD-10-CM | POA: Diagnosis not present

## 2020-05-12 DIAGNOSIS — G931 Anoxic brain damage, not elsewhere classified: Secondary | ICD-10-CM | POA: Diagnosis not present

## 2020-05-12 DIAGNOSIS — T424X5A Adverse effect of benzodiazepines, initial encounter: Secondary | ICD-10-CM | POA: Diagnosis not present

## 2020-05-12 DIAGNOSIS — Z66 Do not resuscitate: Secondary | ICD-10-CM | POA: Diagnosis present

## 2020-05-12 DIAGNOSIS — Z20822 Contact with and (suspected) exposure to covid-19: Secondary | ICD-10-CM | POA: Diagnosis present

## 2020-05-12 DIAGNOSIS — F0390 Unspecified dementia without behavioral disturbance: Secondary | ICD-10-CM | POA: Diagnosis present

## 2020-05-12 DIAGNOSIS — J690 Pneumonitis due to inhalation of food and vomit: Secondary | ICD-10-CM | POA: Diagnosis present

## 2020-05-12 DIAGNOSIS — I499 Cardiac arrhythmia, unspecified: Secondary | ICD-10-CM | POA: Diagnosis not present

## 2020-05-12 DIAGNOSIS — Z8673 Personal history of transient ischemic attack (TIA), and cerebral infarction without residual deficits: Secondary | ICD-10-CM | POA: Diagnosis not present

## 2020-05-12 DIAGNOSIS — R6889 Other general symptoms and signs: Secondary | ICD-10-CM | POA: Diagnosis not present

## 2020-05-12 DIAGNOSIS — R001 Bradycardia, unspecified: Secondary | ICD-10-CM | POA: Diagnosis not present

## 2020-05-12 DIAGNOSIS — G40411 Other generalized epilepsy and epileptic syndromes, intractable, with status epilepticus: Secondary | ICD-10-CM | POA: Diagnosis not present

## 2020-05-12 DIAGNOSIS — T17908A Unspecified foreign body in respiratory tract, part unspecified causing other injury, initial encounter: Secondary | ICD-10-CM | POA: Diagnosis present

## 2020-05-12 DIAGNOSIS — I10 Essential (primary) hypertension: Secondary | ICD-10-CM | POA: Diagnosis present

## 2020-05-12 DIAGNOSIS — I4891 Unspecified atrial fibrillation: Secondary | ICD-10-CM | POA: Diagnosis not present

## 2020-05-12 DIAGNOSIS — R0689 Other abnormalities of breathing: Secondary | ICD-10-CM | POA: Diagnosis not present

## 2020-05-12 DIAGNOSIS — Z4682 Encounter for fitting and adjustment of non-vascular catheter: Secondary | ICD-10-CM | POA: Diagnosis not present

## 2020-05-12 LAB — I-STAT ARTERIAL BLOOD GAS, ED
Acid-base deficit: 3 mmol/L — ABNORMAL HIGH (ref 0.0–2.0)
Bicarbonate: 22.2 mmol/L (ref 20.0–28.0)
Calcium, Ion: 1.07 mmol/L — ABNORMAL LOW (ref 1.15–1.40)
HCT: 21 % — ABNORMAL LOW (ref 36.0–46.0)
Hemoglobin: 7.1 g/dL — ABNORMAL LOW (ref 12.0–15.0)
O2 Saturation: 100 %
Patient temperature: 95.5
Potassium: 3.6 mmol/L (ref 3.5–5.1)
Sodium: 146 mmol/L — ABNORMAL HIGH (ref 135–145)
TCO2: 23 mmol/L (ref 22–32)
pCO2 arterial: 35.8 mmHg (ref 32.0–48.0)
pH, Arterial: 7.393 (ref 7.350–7.450)
pO2, Arterial: 467 mmHg — ABNORMAL HIGH (ref 83.0–108.0)

## 2020-05-12 LAB — CBC
HCT: 34.6 % — ABNORMAL LOW (ref 36.0–46.0)
Hemoglobin: 10.3 g/dL — ABNORMAL LOW (ref 12.0–15.0)
MCH: 32.1 pg (ref 26.0–34.0)
MCHC: 29.8 g/dL — ABNORMAL LOW (ref 30.0–36.0)
MCV: 107.8 fL — ABNORMAL HIGH (ref 80.0–100.0)
Platelets: 165 10*3/uL (ref 150–400)
RBC: 3.21 MIL/uL — ABNORMAL LOW (ref 3.87–5.11)
RDW: 12.5 % (ref 11.5–15.5)
WBC: 13.4 10*3/uL — ABNORMAL HIGH (ref 4.0–10.5)
nRBC: 0.2 % (ref 0.0–0.2)

## 2020-05-12 LAB — TROPONIN I (HIGH SENSITIVITY): Troponin I (High Sensitivity): 27 ng/L — ABNORMAL HIGH (ref <18)

## 2020-05-12 LAB — BASIC METABOLIC PANEL
Anion gap: 13 (ref 5–15)
BUN: 33 mg/dL — ABNORMAL HIGH (ref 8–23)
CO2: 17 mmol/L — ABNORMAL LOW (ref 22–32)
Calcium: 8.7 mg/dL — ABNORMAL LOW (ref 8.9–10.3)
Chloride: 109 mmol/L (ref 98–111)
Creatinine, Ser: 2.21 mg/dL — ABNORMAL HIGH (ref 0.44–1.00)
GFR, Estimated: 21 mL/min — ABNORMAL LOW (ref >60)
Glucose, Bld: 239 mg/dL — ABNORMAL HIGH (ref 70–99)
Potassium: 4.7 mmol/L (ref 3.5–5.1)
Sodium: 139 mmol/L (ref 135–145)

## 2020-05-12 LAB — MAGNESIUM: Magnesium: 2.2 mg/dL (ref 1.7–2.4)

## 2020-05-12 MED ORDER — FENTANYL 2500MCG IN NS 250ML (10MCG/ML) PREMIX INFUSION
20.0000 ug/h | INTRAVENOUS | Status: DC
  Administered 2020-05-13 (×2): 20 ug/h via INTRAVENOUS
  Filled 2020-05-12: qty 250

## 2020-05-12 MED ORDER — NOREPINEPHRINE 4 MG/250ML-% IV SOLN
INTRAVENOUS | Status: AC
  Filled 2020-05-12: qty 250

## 2020-05-12 MED ORDER — SODIUM BICARBONATE 8.4 % IV SOLN
INTRAVENOUS | Status: AC | PRN
  Administered 2020-05-12 (×2): 50 meq via INTRAVENOUS

## 2020-05-12 MED ORDER — NOREPINEPHRINE 4 MG/250ML-% IV SOLN
0.0000 ug/min | INTRAVENOUS | Status: DC
  Administered 2020-05-12: 5 ug/min via INTRAVENOUS

## 2020-05-12 MED ORDER — PIPERACILLIN-TAZOBACTAM 3.375 G IVPB 30 MIN
3.3750 g | Freq: Once | INTRAVENOUS | Status: AC
  Administered 2020-05-13: 3.375 g via INTRAVENOUS
  Filled 2020-05-12: qty 50

## 2020-05-12 MED ORDER — VANCOMYCIN HCL IN DEXTROSE 1-5 GM/200ML-% IV SOLN
1000.0000 mg | Freq: Once | INTRAVENOUS | Status: AC
  Administered 2020-05-13: 1000 mg via INTRAVENOUS
  Filled 2020-05-12: qty 200

## 2020-05-12 MED ORDER — ATROPINE SULFATE 1 MG/ML IJ SOLN
INTRAMUSCULAR | Status: AC | PRN
  Administered 2020-05-12: 1 mg via INTRAVENOUS

## 2020-05-12 MED ORDER — EPINEPHRINE 1 MG/10ML IJ SOSY
PREFILLED_SYRINGE | INTRAMUSCULAR | Status: AC | PRN
  Administered 2020-05-12 (×3): 1 mg via INTRAVENOUS

## 2020-05-12 NOTE — Code Documentation (Signed)
ET tube placed, color change noted, + breath sounds.

## 2020-05-12 NOTE — Progress Notes (Signed)
Vt adjusted  (increased) to 8cc  and RR decreased to match Ve of previous settings with good ABG result.

## 2020-05-12 NOTE — Code Documentation (Signed)
Pulse check -pulses

## 2020-05-12 NOTE — ED Triage Notes (Signed)
Pt bib [facility] from home for cardiac arrest. Initial c/o unresponsive w/ HR 30, pt arrested w/ EMS and compressions were started immediately. Pt received epi 1 mg x 6 w/ EMS and regained pulses for approx 15 mins. Pt began to become agitated and EMS gave 2.5 mg midazolam. Pt lost pulses again and compressions were resumed. Pt arrived to ED on [facility] with king airway and IO in RLE in place.

## 2020-05-12 NOTE — Progress Notes (Signed)
Pt arrived by EMS with king airway and lucas in place.  King tube removed and pt intubated by ER MD with 7.5 ETT.  Placement 22@ lip, good breath sounds bilateral, color change on end tidal and good chest rise.  Pt airway secured.  After regaining pulses, pt placed on vent. ABG drawn within 30 mins.  Awaiting CXR for ETT placement confirmation.  RT will continue to monitor.

## 2020-05-12 NOTE — Code Documentation (Addendum)
Pulse check: no pulses; compressions resumed

## 2020-05-12 NOTE — ED Notes (Signed)
16 Fr OG tube placed, placement verified by auscultation.

## 2020-05-12 NOTE — ED Provider Notes (Signed)
[facility] EMERGENCY DEPARTMENT Provider Note   CSN: [id number] Arrival date & time: 05/25/2020  2238     History Chief Complaint  Patient presents with  . Cardiac Arrest    [name] is a 86 y.o. female here presenting with cardiac arrest.  Patient has a history of strokes and hypertension.  Patient was altered this morning.  Patient did not get out of bed today.  Per the husband, he tried to give her the evening medicines and she took it and then became altered. EMS got here and she was noted to be bradycardic.  Patient then had a cardiac arrest and chest compressions were started right away.  Patient was given epinephrine and downtime was about 15 minutes.  Patient was also given Versed for agitation and then pulses were lost again.  Patient had another about 15 minutes of downtime afterwards.   The history is provided by the spouse.       History reviewed. No pertinent past medical history.  There are no problems to display for this patient.   History reviewed. No pertinent surgical history.   OB History   No obstetric history on file.     No family history on file.  Social History   Tobacco Use  . Smoking status: Not on file  Substance Use Topics  . Alcohol use: Not on file  . Drug use: Not on file    Home Medications Prior to Admission medications   Not on File    Allergies    Patient has no allergy information on record.  Review of Systems   Review of Systems  Unable to perform ROS: Intubated  All other systems reviewed and are negative.   Physical Exam Updated Vital Signs BP 114/75   Pulse 85   Temp (!) 95.5 F (35.3 C) (Temporal)   Resp 20   Ht 5\' 8"  (1.727 m)   SpO2 100%   Physical Exam Vitals and nursing note reviewed.  Constitutional:      Comments: Altered.  Lethargic.  No spontaneous movements.  HENT:     Head: Normocephalic.     Mouth/Throat:     Mouth: Mucous membranes are dry.  Eyes:     Extraocular  Movements: Extraocular movements intact.     Pupils: Pupils are equal, round, and reactive to light.  Cardiovascular:     Comments: Asystolic Pulmonary:     Comments: No spontaneous breath sounds Abdominal:     General: Abdomen is flat.     Palpations: Abdomen is soft.  Musculoskeletal:        General: Normal range of motion.     Cervical back: Normal range of motion.  Skin:    General: Skin is warm.     Capillary Refill: Capillary refill takes less than 2 seconds.  Neurological:     Comments: No spontaneous movement  Psychiatric:     Comments: Unable     ED Results / Procedures / Treatments   Labs (all labs ordered are listed, but only abnormal results are displayed) Labs Reviewed  CBC - Abnormal; Notable for the following components:      Result Value   WBC 13.4 (*)    RBC 3.21 (*)    Hemoglobin 10.3 (*)    HCT 34.6 (*)    MCV 107.8 (*)    MCHC 29.8 (*)    All other components within normal limits  I-STAT ARTERIAL BLOOD GAS, ED - Abnormal; Notable for the  following components:   pO2, Arterial 467 (*)    Acid-base deficit 3.0 (*)    Sodium 146 (*)    Calcium, Ion 1.07 (*)    HCT 21.0 (*)    Hemoglobin 7.1 (*)    All other components within normal limits  RESPIRATORY PANEL BY RT PCR (FLU A&B, COVID)  CULTURE, BLOOD (ROUTINE X 2)  CULTURE, BLOOD (ROUTINE X 2)  BASIC METABOLIC PANEL  LACTIC ACID, PLASMA  MAGNESIUM  BLOOD GAS, ARTERIAL  I-STAT CHEM 8, ED  TROPONIN I (HIGH SENSITIVITY)    EKG None  Radiology DG Abdomen 1 View  Result Date: 05/05/2020 CLINICAL DATA:  Orogastric tube placed EXAM: ABDOMEN - 1 VIEW COMPARISON:  None FINDINGS: Orogastric tube side port is just below the GE junction. 5 cm advancement suggested. Gaseous distention of the stomach. No dilated small bowel. IMPRESSION: Orogastric tube side port just below the GE junction. Recommend advancing by 5 cm. Electronically Signed   By: Ulyses Jarred M.D.   On: 05/19/2020 23:23   DG Chest  Portable 1 View  Result Date: 05/20/2020 CLINICAL DATA:  Cardiac arrest EXAM: PORTABLE CHEST 1 VIEW COMPARISON:  04/16/2019 FINDINGS: There is consolidation in the right upper lobe. Endotracheal tube tip is at the level of the clavicular heads. Orogastric tube side port is just below the GE junction. Normal pleural spaces. Calcific aortic atherosclerosis. IMPRESSION: 1. Endotracheal tube tip at the level of the clavicular heads. 2. Right upper lobe consolidation, likely pneumonia. 3. Orogastric tube side port just below the GE junction. Electronically Signed   By: Ulyses Jarred M.D.   On: 05/26/2020 23:22    Procedures Procedures (including critical care time)   INTUBATION Performed by: Wandra Arthurs  Required items: required blood products, implants, devices, and special equipment available Patient identity confirmed: provided demographic data and hospital-assigned identification number Time out: Immediately prior to procedure a "time out" was called to verify the correct patient, procedure, equipment, support staff and site/side marked as required.  Indications: cardiac arrest  Intubation method: Glidescope Laryngoscopy   Preoxygenation: BVM  Sedatives: none  Paralytic: none   Tube Size: 7.5 cuffed  Post-procedure assessment: chest rise and ETCO2 monitor Breath sounds: equal and absent over the epigastrium Tube secured with: ETT holder Chest x-ray interpreted by radiologist and me.  Chest x-ray findings: endotracheal tube in appropriate position  Patient tolerated the procedure well with no immediate complications.  CRITICAL CARE Performed by: Wandra Arthurs   Total critical care time: 40 minutes  Critical care time was exclusive of separately billable procedures and treating other patients.  Critical care was necessary to treat or prevent imminent or life-threatening deterioration.  Critical care was time spent personally by me on the following activities: development of  treatment plan with patient and/or surrogate as well as nursing, discussions with consultants, evaluation of patient's response to treatment, examination of patient, obtaining history from patient or surrogate, ordering and performing treatments and interventions, ordering and review of laboratory studies, ordering and review of radiographic studies, pulse oximetry and re-evaluation of patient's condition.  Cardiopulmonary Resuscitation (CPR) Procedure Note Directed/Performed by: Wandra Arthurs I personally directed ancillary staff and/or performed CPR in an effort to regain return of spontaneous circulation and to maintain cardiac, neuro and systemic perfusion.    Medications Ordered in ED Medications  EPINEPHrine (ADRENALIN) 1 MG/10ML injection (1 mg Intravenous Given 05/24/2020 2245)  sodium bicarbonate injection (50 mEq Intravenous Given 04/30/2020 2251)  atropine injection (1 mg Intravenous  Given 05/04/2020 2252)  norepinephrine (LEVOPHED) 4mg  in 222mL premix infusion (14 mcg/min Intravenous Rate/Dose Change 05/19/2020 2328)  vancomycin (VANCOCIN) IVPB 1000 mg/200 mL premix (has no administration in time range)  piperacillin-tazobactam (ZOSYN) IVPB 3.375 g (has no administration in time range)    ED Course  I have reviewed the triage vital signs and the nursing notes.  Pertinent labs & imaging results that were available during my care of the patient were reviewed by me and considered in my medical decision making (see chart for details).    MDM Rules/Calculators/A&P                         [name] is a 86 y.o. female here after cardiac arrest.  Unknown CODE STATUS at this point.  Total downtime around 30 minutes.  Will intubate patient and call family.   11:37 PM I talked to husband and he states that she became altered after swallowing some pills concerned that she was aspirated.  Chest x-ray showed pneumonia.  I ordered broad-spectrum antibiotics.  I had detailed discussion regarding  CODE STATUS.  He states that she would want to not be on a ventilator.  Apparently she has a DNR in the chart.  I discussed about pulling the ET tube.  He states that he is not ready for that yet.  I told him that if she codes again I do not advise CODE BLUE and he is in agreement.  Therefore patient is a no code currently.  Critical care is consulted to see patient.    Final Clinical Impression(s) / ED Diagnoses Final diagnoses:  None    Rx / DC Orders ED Discharge Orders    None       Drenda Freeze, MD 05/04/2020 2338

## 2020-05-13 ENCOUNTER — Other Ambulatory Visit: Payer: Self-pay

## 2020-05-13 ENCOUNTER — Inpatient Hospital Stay (HOSPITAL_COMMUNITY): Payer: Medicare Other

## 2020-05-13 ENCOUNTER — Encounter (HOSPITAL_COMMUNITY): Payer: Self-pay | Admitting: Pulmonary Disease

## 2020-05-13 DIAGNOSIS — Z9911 Dependence on respirator [ventilator] status: Secondary | ICD-10-CM | POA: Diagnosis not present

## 2020-05-13 DIAGNOSIS — J449 Chronic obstructive pulmonary disease, unspecified: Secondary | ICD-10-CM | POA: Diagnosis present

## 2020-05-13 DIAGNOSIS — Z20822 Contact with and (suspected) exposure to covid-19: Secondary | ICD-10-CM | POA: Diagnosis present

## 2020-05-13 DIAGNOSIS — G931 Anoxic brain damage, not elsewhere classified: Secondary | ICD-10-CM | POA: Diagnosis not present

## 2020-05-13 DIAGNOSIS — Z8673 Personal history of transient ischemic attack (TIA), and cerebral infarction without residual deficits: Secondary | ICD-10-CM | POA: Diagnosis not present

## 2020-05-13 DIAGNOSIS — E785 Hyperlipidemia, unspecified: Secondary | ICD-10-CM | POA: Diagnosis present

## 2020-05-13 DIAGNOSIS — F09 Unspecified mental disorder due to known physiological condition: Secondary | ICD-10-CM | POA: Diagnosis not present

## 2020-05-13 DIAGNOSIS — Z7901 Long term (current) use of anticoagulants: Secondary | ICD-10-CM | POA: Diagnosis not present

## 2020-05-13 DIAGNOSIS — I10 Essential (primary) hypertension: Secondary | ICD-10-CM | POA: Diagnosis not present

## 2020-05-13 DIAGNOSIS — G40411 Other generalized epilepsy and epileptic syndromes, intractable, with status epilepticus: Secondary | ICD-10-CM | POA: Diagnosis not present

## 2020-05-13 DIAGNOSIS — Z66 Do not resuscitate: Secondary | ICD-10-CM | POA: Diagnosis present

## 2020-05-13 DIAGNOSIS — R451 Restlessness and agitation: Secondary | ICD-10-CM | POA: Diagnosis present

## 2020-05-13 DIAGNOSIS — I959 Hypotension, unspecified: Secondary | ICD-10-CM | POA: Diagnosis not present

## 2020-05-13 DIAGNOSIS — I469 Cardiac arrest, cause unspecified: Principal | ICD-10-CM

## 2020-05-13 DIAGNOSIS — Z79899 Other long term (current) drug therapy: Secondary | ICD-10-CM | POA: Diagnosis not present

## 2020-05-13 DIAGNOSIS — R001 Bradycardia, unspecified: Secondary | ICD-10-CM | POA: Diagnosis not present

## 2020-05-13 DIAGNOSIS — J690 Pneumonitis due to inhalation of food and vomit: Secondary | ICD-10-CM | POA: Diagnosis not present

## 2020-05-13 DIAGNOSIS — Z515 Encounter for palliative care: Secondary | ICD-10-CM | POA: Diagnosis not present

## 2020-05-13 DIAGNOSIS — F0390 Unspecified dementia without behavioral disturbance: Secondary | ICD-10-CM | POA: Diagnosis present

## 2020-05-13 DIAGNOSIS — R410 Disorientation, unspecified: Secondary | ICD-10-CM | POA: Diagnosis not present

## 2020-05-13 DIAGNOSIS — G40401 Other generalized epilepsy and epileptic syndromes, not intractable, with status epilepticus: Secondary | ICD-10-CM | POA: Diagnosis not present

## 2020-05-13 DIAGNOSIS — T424X5A Adverse effect of benzodiazepines, initial encounter: Secondary | ICD-10-CM | POA: Diagnosis not present

## 2020-05-13 DIAGNOSIS — J9601 Acute respiratory failure with hypoxia: Secondary | ICD-10-CM | POA: Diagnosis present

## 2020-05-13 DIAGNOSIS — G40419 Other generalized epilepsy and epileptic syndromes, intractable, without status epilepticus: Secondary | ICD-10-CM | POA: Diagnosis not present

## 2020-05-13 DIAGNOSIS — I4891 Unspecified atrial fibrillation: Secondary | ICD-10-CM | POA: Diagnosis present

## 2020-05-13 LAB — CBC
HCT: 31.7 % — ABNORMAL LOW (ref 36.0–46.0)
Hemoglobin: 9.8 g/dL — ABNORMAL LOW (ref 12.0–15.0)
MCH: 32.1 pg (ref 26.0–34.0)
MCHC: 30.9 g/dL (ref 30.0–36.0)
MCV: 103.9 fL — ABNORMAL HIGH (ref 80.0–100.0)
Platelets: 176 10*3/uL (ref 150–400)
RBC: 3.05 MIL/uL — ABNORMAL LOW (ref 3.87–5.11)
RDW: 12.7 % (ref 11.5–15.5)
WBC: 14 10*3/uL — ABNORMAL HIGH (ref 4.0–10.5)
nRBC: 0 % (ref 0.0–0.2)

## 2020-05-13 LAB — ECHOCARDIOGRAM COMPLETE
AR max vel: 1.27 cm2
AV Area VTI: 1.14 cm2
AV Area mean vel: 1.23 cm2
AV Mean grad: 5.3 mmHg
AV Peak grad: 12.9 mmHg
Ao pk vel: 1.79 m/s
Area-P 1/2: 5.02 cm2
Height: 68 in
S' Lateral: 3.1 cm
Weight: 1851.86 oz

## 2020-05-13 LAB — BASIC METABOLIC PANEL
Anion gap: 10 (ref 5–15)
Anion gap: 13 (ref 5–15)
BUN: 33 mg/dL — ABNORMAL HIGH (ref 8–23)
BUN: 37 mg/dL — ABNORMAL HIGH (ref 8–23)
CO2: 22 mmol/L (ref 22–32)
CO2: 20 mmol/L — ABNORMAL LOW (ref 22–32)
Calcium: 8.1 mg/dL — ABNORMAL LOW (ref 8.9–10.3)
Calcium: 8.2 mg/dL — ABNORMAL LOW (ref 8.9–10.3)
Chloride: 110 mmol/L (ref 98–111)
Chloride: 109 mmol/L (ref 98–111)
Creatinine, Ser: 2.08 mg/dL — ABNORMAL HIGH (ref 0.44–1.00)
Creatinine, Ser: 2.36 mg/dL — ABNORMAL HIGH (ref 0.44–1.00)
GFR, Estimated: 22 mL/min — ABNORMAL LOW (ref >60)
GFR, Estimated: 19 mL/min — ABNORMAL LOW (ref >60)
Glucose, Bld: 244 mg/dL — ABNORMAL HIGH (ref 70–99)
Glucose, Bld: 230 mg/dL — ABNORMAL HIGH (ref 70–99)
Potassium: 5.4 mmol/L — ABNORMAL HIGH (ref 3.5–5.1)
Potassium: 4.8 mmol/L (ref 3.5–5.1)
Sodium: 142 mmol/L (ref 135–145)
Sodium: 142 mmol/L (ref 135–145)

## 2020-05-13 LAB — APTT: aPTT: 35 seconds (ref 24–36)

## 2020-05-13 LAB — MAGNESIUM: Magnesium: 1.8 mg/dL (ref 1.7–2.4)

## 2020-05-13 LAB — MRSA PCR SCREENING: MRSA by PCR: NEGATIVE

## 2020-05-13 LAB — LACTIC ACID, PLASMA: Lactic Acid, Venous: 7.1 mmol/L (ref 0.5–1.9)

## 2020-05-13 LAB — PROTIME-INR
INR: 1.6 — ABNORMAL HIGH (ref 0.8–1.2)
Prothrombin Time: 18 seconds — ABNORMAL HIGH (ref 11.4–15.2)

## 2020-05-13 LAB — RESPIRATORY PANEL BY RT PCR (FLU A&B, COVID)
Influenza A by PCR: NEGATIVE
Influenza B by PCR: NEGATIVE
SARS Coronavirus 2 by RT PCR: NEGATIVE

## 2020-05-13 LAB — TROPONIN I (HIGH SENSITIVITY): Troponin I (High Sensitivity): 219 ng/L (ref <18)

## 2020-05-13 LAB — GLUCOSE, CAPILLARY
Glucose-Capillary: 169 mg/dL — ABNORMAL HIGH (ref 70–99)
Glucose-Capillary: 143 mg/dL — ABNORMAL HIGH (ref 70–99)
Glucose-Capillary: 172 mg/dL — ABNORMAL HIGH (ref 70–99)
Glucose-Capillary: 49 mg/dL — ABNORMAL LOW (ref 70–99)
Glucose-Capillary: 133 mg/dL — ABNORMAL HIGH (ref 70–99)
Glucose-Capillary: 115 mg/dL — ABNORMAL HIGH (ref 70–99)
Glucose-Capillary: 97 mg/dL (ref 70–99)

## 2020-05-13 LAB — PHOSPHORUS: Phosphorus: 4.5 mg/dL (ref 2.5–4.6)

## 2020-05-13 LAB — HEMOGLOBIN A1C
Hgb A1c MFr Bld: 5.5 % (ref 4.8–5.6)
Mean Plasma Glucose: 111.15 mg/dL

## 2020-05-13 MED ORDER — CHLORHEXIDINE GLUCONATE CLOTH 2 % EX PADS
6.0000 | MEDICATED_PAD | Freq: Every day | CUTANEOUS | Status: DC
  Administered 2020-05-13 – 2020-05-14 (×2): 6 via TOPICAL

## 2020-05-13 MED ORDER — LORAZEPAM 2 MG/ML IJ SOLN
INTRAMUSCULAR | Status: AC
  Administered 2020-05-13: 1 mg via INTRAVENOUS
  Filled 2020-05-13: qty 1

## 2020-05-13 MED ORDER — FENTANYL 2500MCG IN NS 250ML (10MCG/ML) PREMIX INFUSION
0.0000 ug/h | INTRAVENOUS | Status: DC
  Administered 2020-05-14: 100 ug/h via INTRAVENOUS
  Filled 2020-05-13 (×2): qty 250

## 2020-05-13 MED ORDER — INSULIN ASPART 100 UNIT/ML ~~LOC~~ SOLN
0.0000 [IU] | SUBCUTANEOUS | Status: DC
  Administered 2020-05-13: 1 [IU] via SUBCUTANEOUS

## 2020-05-13 MED ORDER — ATROPINE SULFATE 0.4 MG/ML IJ SOLN
0.4000 mg | Freq: Once | INTRAMUSCULAR | Status: AC
  Filled 2020-05-13: qty 1

## 2020-05-13 MED ORDER — PANTOPRAZOLE SODIUM 40 MG PO TBEC: 40.0000 mg | DELAYED_RELEASE_TABLET | Freq: Every day | ORAL | Status: DC

## 2020-05-13 MED ORDER — SODIUM CHLORIDE 0.9 % IV SOLN: INTRAVENOUS | Status: DC

## 2020-05-13 MED ORDER — DEXTROSE 50 % IV SOLN
INTRAVENOUS | Status: AC
  Administered 2020-05-13: 25 g via INTRAVENOUS
  Filled 2020-05-13: qty 50

## 2020-05-13 MED ORDER — SODIUM CHLORIDE 0.9 % IV SOLN: INTRAVENOUS | Status: DC | PRN

## 2020-05-13 MED ORDER — DEXTROSE 50 % IV SOLN: 25.0000 g | Freq: Once | INTRAVENOUS | Status: AC

## 2020-05-13 MED ORDER — NOREPINEPHRINE 4 MG/250ML-% IV SOLN
0.0000 ug/min | INTRAVENOUS | Status: DC
  Administered 2020-05-13: 10 ug/min via INTRAVENOUS
  Administered 2020-05-13: 8 ug/min via INTRAVENOUS
  Administered 2020-05-13: 15.013 ug/min via INTRAVENOUS
  Administered 2020-05-14: 5 ug/min via INTRAVENOUS
  Filled 2020-05-13 (×4): qty 250

## 2020-05-13 MED ORDER — CHLORHEXIDINE GLUCONATE 0.12% ORAL RINSE (MEDLINE KIT)
15.0000 mL | Freq: Two times a day (BID) | OROMUCOSAL | Status: DC
  Administered 2020-05-13 – 2020-05-14 (×3): 15 mL via OROMUCOSAL

## 2020-05-13 MED ORDER — VANCOMYCIN HCL 750 MG/150ML IV SOLN: 750.0000 mg | INTRAVENOUS | Status: DC

## 2020-05-13 MED ORDER — PIPERACILLIN-TAZOBACTAM IN DEX 2-0.25 GM/50ML IV SOLN
2.2500 g | Freq: Three times a day (TID) | INTRAVENOUS | Status: DC
  Administered 2020-05-13 – 2020-05-14 (×3): 2.25 g via INTRAVENOUS
  Filled 2020-05-13 (×4): qty 50

## 2020-05-13 MED ORDER — LORAZEPAM 2 MG/ML IJ SOLN: 1.0000 mg | Freq: Once | INTRAMUSCULAR | Status: AC

## 2020-05-13 MED ORDER — ATROPINE SULFATE 1 MG/10ML IJ SOSY
PREFILLED_SYRINGE | INTRAMUSCULAR | Status: AC
  Administered 2020-05-13: 0.4 mg
  Filled 2020-05-13: qty 10

## 2020-05-13 MED ORDER — ORAL CARE MOUTH RINSE
15.0000 mL | OROMUCOSAL | Status: DC
  Administered 2020-05-13 – 2020-05-14 (×16): 15 mL via OROMUCOSAL

## 2020-05-13 MED ORDER — ASPIRIN 300 MG RE SUPP
300.0000 mg | RECTAL | Status: AC
  Administered 2020-05-13: 300 mg via RECTAL
  Filled 2020-05-13: qty 1

## 2020-05-13 MED ORDER — PANTOPRAZOLE SODIUM 40 MG IV SOLR
40.0000 mg | INTRAVENOUS | Status: DC
  Administered 2020-05-13 – 2020-05-14 (×2): 40 mg via INTRAVENOUS
  Filled 2020-05-13 (×2): qty 40

## 2020-05-13 MED ORDER — HEPARIN SODIUM (PORCINE) 5000 UNIT/ML IJ SOLN
5000.0000 [IU] | Freq: Three times a day (TID) | INTRAMUSCULAR | Status: DC
  Administered 2020-05-13 – 2020-05-14 (×4): 5000 [IU] via SUBCUTANEOUS
  Filled 2020-05-13 (×4): qty 1

## 2020-05-13 NOTE — H&P (Signed)
NAME:  [name], MRN:  [id number], DOB:  March 10, 1928, LOS: 0 ADMISSION DATE:  05/21/2020, , CHIEF COMPLAINT:  Cardiac arrest  Brief History   86 yo female that had a total time cardiac arrest of 46mins  History of present illness   This ia 86 y/o female that presented to ER from home.  The was getting ready for bed and was being assisted by her husband when she acutely became unresponsive.  When EMS arrived they found her with a heart rate of 30. She then went into cardiac arrest. Two episodes of cardiac arrest occurred with each lasting approx 73mins.  Pt was intubated in the ER but did not require sedaion.  No significant neurologic recovery since the events.  She is a DNR and has been in the past   Past Medical History  Afib COPD Hyperlipidemia CVA Dementia   Significant Diagnostic Tests:  CXR:  Right upper lobe infiltrate  Micro Data:  Influenza negative COVID negative  Antimicrobials:  Vanco Zosyn  Objective   Blood pressure 133/69, pulse 81, temperature (!) 95.5 F (35.3 C), temperature source Temporal, resp. rate 15, height 5\' 8"  (1.727 m), SpO2 100 %.    Vent Mode: PRVC FiO2 (%):  [100 %] 100 % Set Rate:  [18 bmp-20 bmp] 18 bmp Vt Set:  [450 mL-510 mL] 510 mL PEEP:  [5 cmH20] 5 cmH20 Plateau Pressure:  [17 cmH20] 17 cmH20  No intake or output data in the 24 hours ending 05/13/20 0111 There were no vitals filed for this visit.  Examination: General: No acute distress HENT: Atraumatic normocephalic MMM orally intubated Lungs: CTAB  No wheezing rales or rhonchi Cardiovascular: IRR IRR no murmur Abdomen: sovt Extremities: No edema  Neuro: unconscious unresponsive.  No response to painful stim. No gag  Blunted cough  Pupils are equal at 74mm GU: foley cath intact   Assessment & Plan:  OOH cardiac arrest Acute resp failure Anoxic encephalopathy Afib Dementia COPD Right upper lobe infiltrate  Plan: Admit to the ICU for further workup  Standard vent  protocol was started TTM ordered Continue to follow the neuro status Wean the Levophed for MAP >60 Echocardiogram has been ordered Monitor I/O Pt was started on fentanyl but has not had significant neuro recovery Emperic abx for the right upper lobe infiltrate   Best practice:  Diet: NPO Pain/Anxiety/Delirium protocol (if indicated): fentanyl  VAP protocol (if indicated): started DVT prophylaxis: Heparin SQ GI prophylaxis: Protonix  Glucose control: Insulin SS *Mobility: Bedrest Code Status: DNR Family Communication: Updated the case with the pts husband at the bedside Disposition: admit to the ICU  Labs   CBC: Recent Labs  Lab 05/08/2020 2305 05/25/2020 2318  WBC 13.4*  --   HGB 10.3* 7.1*  HCT 34.6* 21.0*  MCV 107.8*  --   PLT 165  --     Basic Metabolic Panel: Recent Labs  Lab 05/11/2020 2305 05/11/2020 2318  NA 139 146*  K 4.7 3.6  CL 109  --   CO2 17*  --   GLUCOSE 239*  --   BUN 33*  --   CREATININE 2.21*  --   CALCIUM 8.7*  --   MG 2.2  --    GFR: CrCl cannot be calculated (Unknown ideal weight.). Recent Labs  Lab 05/19/2020 2258 05/05/2020 2305  WBC  --  13.4*  LATICACIDVEN 7.1*  --     Liver Function Tests: No results for input(s): AST, ALT, ALKPHOS, BILITOT, PROT, ALBUMIN  in the last 168 hours. No results for input(s): LIPASE, AMYLASE in the last 168 hours. No results for input(s): AMMONIA in the last 168 hours.  ABG    Component Value Date/Time   PHART 7.393 05/08/2020 2318   PCO2ART 35.8 05/29/2020 2318   PO2ART 467 (H) 05/02/2020 2318   HCO3 22.2 04/30/2020 2318   TCO2 23 04/30/2020 2318   ACIDBASEDEF 3.0 (H) 05/01/2020 2318   O2SAT 100.0 05/07/2020 2318     Coagulation Profile: No results for input(s): INR, PROTIME in the last 168 hours.  Cardiac Enzymes: No results for input(s): CKTOTAL, CKMB, CKMBINDEX, TROPONINI in the last 168 hours.  HbA1C: No results found for: HGBA1C  CBG: No results for input(s): GLUCAP in the last 168  hours.  Review of Systems:   Unable to complete secondary to neuro status  Past Medical History  She,  has no past medical history on file.   Surgical History   History reviewed. No pertinent surgical history.   Social History      Family History   Her family history is not on file.   Allergies No Known Allergies   Home Medications  Prior to Admission medications   Medication Sig Start Date End Date Taking? Authorizing Provider  albuterol (PROVENTIL) (2.5 MG/3ML) 0.083% nebulizer solution Take 2.5 mg by nebulization every 6 (six) hours as needed for wheezing or shortness of breath.   Yes [provider]  albuterol (VENTOLIN HFA) 108 (90 Base) MCG/ACT inhaler Inhale 2 puffs into the lungs every 6 (six) hours as needed for wheezing or shortness of breath.    Yes [provider]  apixaban (ELIQUIS) 2.5 MG TABS tablet Take 2.5 mg by mouth 2 (two) times daily.   Yes [provider]  Cholecalciferol (VITAMIN D-3) 25 MCG (1000 UT) CAPS Take 1,000 Units by mouth daily with breakfast.   Yes [provider]  Dextran 70-Hypromellose (LUBRICANT EYE DROPS, PF, OP) Place 1-2 drops into both eyes as needed (for dryness).   Yes [provider]  QUEtiapine (SEROQUEL) 50 MG tablet Take 100 mg by mouth at bedtime.   Yes [provider]     Critical care time:2mins

## 2020-05-13 NOTE — Plan of Care (Signed)
  Problem: Education: Goal: Ability to manage disease process will improve Outcome: Progressing   Problem: Cardiac: Goal: Ability to achieve and maintain adequate cardiopulmonary perfusion will improve Outcome: Progressing   Problem: Neurologic: Goal: Promote progressive neurologic recovery Outcome: Progressing   Problem: Skin Integrity: Goal: Risk for impaired skin integrity will be minimized. Outcome: Progressing   Problem: Education: Goal: Knowledge of General Education information will improve Description: Including pain rating scale, medication(s)/side effects and non-pharmacologic comfort measures Outcome: Progressing   Problem: Health Behavior/Discharge Planning: Goal: Ability to manage health-related needs will improve Outcome: Progressing   Problem: Clinical Measurements: Goal: Ability to maintain clinical measurements within normal limits will improve Outcome: Progressing Goal: Will remain free from infection Outcome: Progressing Goal: Diagnostic test results will improve Outcome: Progressing Goal: Respiratory complications will improve Outcome: Progressing Goal: Cardiovascular complication will be avoided Outcome: Progressing   Problem: Activity: Goal: Risk for activity intolerance will decrease Outcome: Progressing   Problem: Nutrition: Goal: Adequate nutrition will be maintained Outcome: Progressing

## 2020-05-13 NOTE — Progress Notes (Signed)
Pt transported from ED to [place] 11 w/o complications.  Pt suctioned before transport removing small amount of bloody secretions.  Unit RT given report.

## 2020-05-13 NOTE — Progress Notes (Addendum)
NAME:  [name], MRN:  [id number], DOB:  06/19/1928, LOS: 0 ADMISSION DATE:  05/13/2020, , CHIEF COMPLAINT:  Cardiac arrest  Brief History   86 yo female that had a total time cardiac arrest of 42mins  History of present illness   This ia 86 y/o female that presented to ER from home.  The was getting ready for bed and was being assisted by her husband when she acutely became unresponsive.  When EMS arrived they found her with a heart rate of 30. She then went into cardiac arrest. Two episodes of cardiac arrest occurred with each lasting approx 60mins.  Pt was intubated in the ER but did not require sedation.  No significant neurologic recovery since the events.  She is a DNR and has been in the past.    Past Medical History  Afib COPD Hyperlipidemia CVA Dementia   Significant Diagnostic Tests:  CXR:  Right upper lobe infiltrate  Micro Data:  Influenza negative COVID negative  Antimicrobials:  Vanco Zosyn  Objective   Blood pressure (!) 126/57, pulse 70, temperature (!) 96.6 F (35.9 C), temperature source Bladder, resp. rate (!) 21, height 5\' 8"  (1.727 m), weight 52.5 kg, SpO2 100 %.    Vent Mode: PRVC FiO2 (%):  [40 %-100 %] 40 % Set Rate:  [18 bmp-20 bmp] 18 bmp Vt Set:  [450 mL-510 mL] 510 mL PEEP:  [5 cmH20] 5 cmH20 Plateau Pressure:  [17 cmH20-19 cmH20] 19 cmH20   Intake/Output Summary (Last 24 hours) at 05/13/2020 0910 Last data filed at 05/13/2020 0600 Gross per 24 hour  Intake 646.69 ml  Output 15 ml  Net 631.69 ml   Filed Weights   05/13/20 0230  Weight: 52.5 kg    Examination: General: No acute distress, emaciated HENT: Atraumatic, normocephalic Lungs: Clear to auscultation bilaterally Cardiovascular: S1-S2 appreciated Abdomen: Soft, bowel sounds appreciated Extremities: No edema  Neuro: Unconscious, unresponsive, on fentanyl Initial exam that revealed no gag, no cough, pupils are 3 mm. Currently on fentanyl so difficult to assess but  however no gag and no cough at present GU: Foley in place  Assessment & Plan:  Out of hospital cardiac arrest Anoxic encephalopathy Posturing -Patient was found with heart rate in the 30s -Unclear downtime prior to being initially evaluated and subsequently did have 2, 15-minute cardiopulmonary resuscitations -Currently posturing with myoclonic jerks -Significant anoxic damage is likely occurred  Acute respiratory failure -Continue full vent support at present  Background history of atrial fibrillation History of dementia  Chronic obstructive pulmonary disease -Continue bronchodilators  Right upper lobe infiltrate may be related to aspiration -On Zosyn -On vancomycin  Continue pressors Wean for MAP greater than 65 Continue TTM   Follow-up echocardiogram  Discussed extensively with daughter at bedside Prognosis is poor secondary to very high likelihood of severe anoxic injury  Best practice:  Diet: NPO Pain/Anxiety/Delirium protocol (if indicated): fentanyl  VAP protocol (if indicated): started DVT prophylaxis: Heparin SQ GI prophylaxis: Protonix  Glucose control: Insulin SS *Mobility: Bedrest Code Status: DNR Family Communication: Discussed with daughter at bedside Disposition: admit to the ICU  Labs   CBC: Recent Labs  Lab 05/07/2020 2305 05/15/2020 2318 05/13/20 0155  WBC 13.4*  --  14.0*  HGB 10.3* 7.1* 9.8*  HCT 34.6* 21.0* 31.7*  MCV 107.8*  --  103.9*  PLT 165  --  625    Basic Metabolic Panel: Recent Labs  Lab 05/11/2020 2305 05/06/2020 2318 05/13/20 0155  NA 139 146* 142  K 4.7 3.6 5.4*  CL 109  --  110  CO2 17*  --  22  GLUCOSE 239*  --  244*  BUN 33*  --  33*  CREATININE 2.21*  --  2.08*  CALCIUM 8.7*  --  8.1*  MG 2.2  --  1.8  PHOS  --   --  4.5   GFR: Estimated Creatinine Clearance: 15.2 mL/min (A) (by C-G formula based on SCr of 2.08 mg/dL (H)). Recent Labs  Lab 05/18/2020 2258 05/13/2020 2305 05/13/20 0155  WBC  --  13.4* 14.0*    LATICACIDVEN 7.1*  --   --     Liver Function Tests: No results for input(s): AST, ALT, ALKPHOS, BILITOT, PROT, ALBUMIN in the last 168 hours. No results for input(s): LIPASE, AMYLASE in the last 168 hours. No results for input(s): AMMONIA in the last 168 hours.  ABG    Component Value Date/Time   PHART 7.393 05/25/2020 2318   PCO2ART 35.8 05/28/2020 2318   PO2ART 467 (H) 05/23/2020 2318   HCO3 22.2 05/20/2020 2318   TCO2 23 05/07/2020 2318   ACIDBASEDEF 3.0 (H) 05/22/2020 2318   O2SAT 100.0 05/13/2020 2318     Coagulation Profile: Recent Labs  Lab 05/13/20 0155  INR 1.6*    Cardiac Enzymes: No results for input(s): CKTOTAL, CKMB, CKMBINDEX, TROPONINI in the last 168 hours.  HbA1C: No results found for: HGBA1C  CBG: Recent Labs  Lab 05/13/20 0442 05/13/20 0803  GLUCAP 169* 143*    Review of Systems:   Unable to complete secondary to neuro status  Past Medical History  She,  has no past medical history on file.   Surgical History   History reviewed. No pertinent surgical history.   Social History   reports that she is a non-smoker but has been exposed to tobacco smoke. She has never used smokeless tobacco.   Family History   Her family history is not on file.   Allergies No Known Allergies   Home Medications  Prior to Admission medications   Medication Sig Start Date End Date Taking? Authorizing Provider  albuterol (PROVENTIL) (2.5 MG/3ML) 0.083% nebulizer solution Take 2.5 mg by nebulization every 6 (six) hours as needed for wheezing or shortness of breath.   Yes [provider]  albuterol (VENTOLIN HFA) 108 (90 Base) MCG/ACT inhaler Inhale 2 puffs into the lungs every 6 (six) hours as needed for wheezing or shortness of breath.    Yes [provider]  apixaban (ELIQUIS) 2.5 MG TABS tablet Take 2.5 mg by mouth 2 (two) times daily.   Yes [provider]  Cholecalciferol (VITAMIN D-3) 25 MCG (1000 UT) CAPS Take 1,000 Units by  mouth daily with breakfast.   Yes [provider]  Dextran 70-Hypromellose (LUBRICANT EYE DROPS, PF, OP) Place 1-2 drops into both eyes as needed (for dryness).   Yes [provider]  QUEtiapine (SEROQUEL) 50 MG tablet Take 100 mg by mouth at bedtime.   Yes [provider]     Critical care time:58mins

## 2020-05-13 NOTE — Progress Notes (Signed)
LTM EEG hooked up and running - no initial skin breakdown - push button tested - neuro notified. [facility] monitored 

## 2020-05-13 NOTE — Progress Notes (Signed)
  Echocardiogram 2D Echocardiogram has been performed.  [name] 05/13/2020, 12:38 PM

## 2020-05-13 NOTE — Progress Notes (Signed)
[facility] Progress Note Patient Name: [name] DOB: [birth year] MRN: [id number]   Date of Service  05/13/2020  HPI/Events of Note  Multiple issues: 1. Now on Norepinephrine IV infusion at 22 mcg/min via PIV. Nursing request for CVL. However, per Dr. Earlie Server the family has refused a CVL. 2. Request to change Fentanyl IV infusion to titrated. 3. Nursing request for A-line d/t difficult stick.   eICU Interventions  Plan: 1. Fentanyl IV infusion. Titrate to RASS = 0 to -1.  2. RT to place A-line.      Intervention Category Major Interventions: Hypotension - evaluation and management;Delirium, psychosis, severe agitation - evaluation and management  Makai Dumond Eugene 05/13/2020, 2:46 AM

## 2020-05-13 NOTE — Progress Notes (Signed)
Patient having jerking movements, Unable to obtain BP with cuff for 1 hour. MD made aware. 1 mg ativan ordered and given. MD Orders placed for arterial line placement by RT. RT attempted to place aline, unsuccessful.   1645 Patient became bradycardic, MD made aware. MD orders placed for 0.4mg  atropine, atropine given by this RN. Patient recovered with HR 70s sustaining. Will continue to monitor.

## 2020-05-13 NOTE — Progress Notes (Signed)
Attempted x 2 to place radial arterial line without success.

## 2020-05-13 NOTE — Procedures (Addendum)
Patient Name: [name]  MRN: [id number]  EEG Attending: Roland Rack  Referring Physician/Provider: Larwance Rote Date: 05/13/2020 Duration: 25 minutes  Patient history: 86 yo F s/p cardiac arrest  Level of alertness: comatose  AEDs during EEG study: none, on fentanyl for sedation  Technical aspects: This EEG study was done with scalp electrodes positioned according to the 10-20 International system of electrode placement. Electrical activity was acquired at a sampling rate of 500Hz  and reviewed with a high frequency filter of 70Hz  and a low frequency filter of 1Hz . EEG data were recorded continuously and digitally stored. There is persistent muscle artifact.   BACKGROUND ACTIVITY: There is persistent muscle artifact throughout the recording which does limit the study to some degree. The background is diffusely attenuated(though muscle activity could be obscuring some underlying fast activity) with the exception of generalized periodic epileptiform discharges (GPEDs) with spike-wave morphology occurring with the periodicity of one every 3 to 4 seconds, though they occasionally occur in pairs, there is no evolution.  There is associated myoclonus with these discharges.  SLEEP RECORDINGS:  No sleep structures are observed  ACTIVATION PROCEDURES:  Hyperventilation and photic stimulation were not performed.  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  No sustained seizure activity was seen.  In the setting of cardiac arrest, this finding is suggestive of poor prognosis.  Roland Rack, MD [facility] [phone]  If 7pm- 7am, please page neurology on call as listed in [place].

## 2020-05-13 NOTE — Progress Notes (Signed)
EEG complete - results pending 

## 2020-05-13 NOTE — Progress Notes (Signed)
Hypoglycemic Event  CBG: 49 1158   Treatment: 25g dextrose 50% IV 1203  Symptoms: N/A pt intubated/sedated  Follow-up CBG: Time:1218 CBG Result:172  Possible Reasons for Event: NPO/ no tube feeds   Comments/MD notified: Dr. Kavin Leech

## 2020-05-13 NOTE — Progress Notes (Signed)
Pharmacy Antibiotic Note  [name] is a 86 y.o. female admitted on 05/24/2020 post cardiac arrest. Pharmacy has been consulted for vancomycin and zosyn dosing.  Patient s/p arrest x 2 with estimated 69min of total downtime. Currently hypothermic, wbc elevated at 14. AKI with scr of 2.3 giving her an estimated crcl ~85ml/min. Lactic acid 7.1. Patient received vancomycin and zosyn load in the ED last night. Orders to continue empiric antibiotics.   Plan: Vancomycin 750mg   IV every 48 hours - next dose due 11/15.  Goal trough 15-20 mcg/mL. Zosyn 2.25g IV q8 hours Follow up need for trough levels and dose adjustments  Height: 5\' 8"  (172.7 cm) Weight: 52.5 kg (115 lb 11.9 oz) IBW/kg (Calculated) : 63.9  Temp (24hrs), Avg:95.8 F (35.4 C), Min:93.9 F (34.4 C), Max:97.2 F (36.2 C)  Recent Labs  Lab 05/05/2020 2258 05/25/2020 2305 05/13/20 0155  WBC  --  13.4* 14.0*  CREATININE  --  2.21* 2.08*  LATICACIDVEN 7.1*  --   --     Estimated Creatinine Clearance: 15.2 mL/min (A) (by C-G formula based on SCr of 2.08 mg/dL (H)).    No Known Allergies  Antimicrobials this admission: Vancomycin 11/13>> Zosyn 11/13>>  Microbiology results: 11/14 bld x2 - ngtd MRSA PCR - neg  Thank you for allowing pharmacy to be a part of this patient's care.  Erin Hearing PharmD., BCPS Clinical Pharmacist 05/13/2020 2:17 PM

## 2020-05-13 NOTE — Progress Notes (Signed)
We are holding off on Aline at this time per [facility] and RN and will further investigate in the morning and then decide.

## 2020-05-14 ENCOUNTER — Encounter: Payer: Self-pay | Admitting: Student in an Organized Health Care Education/Training Program

## 2020-05-14 DIAGNOSIS — G40419 Other generalized epilepsy and epileptic syndromes, intractable, without status epilepticus: Secondary | ICD-10-CM

## 2020-05-14 DIAGNOSIS — G40411 Other generalized epilepsy and epileptic syndromes, intractable, with status epilepticus: Secondary | ICD-10-CM

## 2020-05-14 DIAGNOSIS — G931 Anoxic brain damage, not elsewhere classified: Secondary | ICD-10-CM | POA: Diagnosis not present

## 2020-05-14 DIAGNOSIS — I469 Cardiac arrest, cause unspecified: Secondary | ICD-10-CM | POA: Diagnosis not present

## 2020-05-14 LAB — BASIC METABOLIC PANEL
Anion gap: 11 (ref 5–15)
BUN: 41 mg/dL — ABNORMAL HIGH (ref 8–23)
CO2: 18 mmol/L — ABNORMAL LOW (ref 22–32)
Calcium: 8.2 mg/dL — ABNORMAL LOW (ref 8.9–10.3)
Chloride: 111 mmol/L (ref 98–111)
Creatinine, Ser: 2.64 mg/dL — ABNORMAL HIGH (ref 0.44–1.00)
GFR, Estimated: 17 mL/min — ABNORMAL LOW (ref >60)
Glucose, Bld: 109 mg/dL — ABNORMAL HIGH (ref 70–99)
Potassium: 4.7 mmol/L (ref 3.5–5.1)
Sodium: 140 mmol/L (ref 135–145)

## 2020-05-14 LAB — GLUCOSE, CAPILLARY
Glucose-Capillary: 93 mg/dL (ref 70–99)
Glucose-Capillary: 73 mg/dL (ref 70–99)

## 2020-05-14 LAB — MAGNESIUM: Magnesium: 1.7 mg/dL (ref 1.7–2.4)

## 2020-05-14 LAB — PROTIME-INR
INR: 1.6 — ABNORMAL HIGH (ref 0.8–1.2)
Prothrombin Time: 18.6 seconds — ABNORMAL HIGH (ref 11.4–15.2)

## 2020-05-14 LAB — APTT: aPTT: 67 seconds — ABNORMAL HIGH (ref 24–36)

## 2020-05-14 MED ORDER — POLYVINYL ALCOHOL 1.4 % OP SOLN
1.0000 [drp] | Freq: Four times a day (QID) | OPHTHALMIC | Status: DC | PRN
  Filled 2020-05-14: qty 15

## 2020-05-14 MED ORDER — GLYCOPYRROLATE 0.2 MG/ML IJ SOLN: 0.2000 mg | INTRAMUSCULAR | Status: DC | PRN

## 2020-05-14 MED ORDER — DIPHENHYDRAMINE HCL 50 MG/ML IJ SOLN: 25.0000 mg | INTRAMUSCULAR | Status: DC | PRN

## 2020-05-14 MED ORDER — DEXTROSE 50 % IV SOLN
INTRAVENOUS | Status: AC
  Administered 2020-05-14: 25 mL
  Filled 2020-05-14: qty 50

## 2020-05-14 MED ORDER — MIDAZOLAM HCL 2 MG/2ML IJ SOLN
1.0000 mg | INTRAMUSCULAR | Status: DC | PRN
  Administered 2020-05-14 (×3): 1 mg via INTRAVENOUS
  Filled 2020-05-14 (×3): qty 2

## 2020-05-14 MED ORDER — MAGNESIUM SULFATE 2 GM/50ML IV SOLN
2.0000 g | Freq: Once | INTRAVENOUS | Status: AC
  Administered 2020-05-14: 2 g via INTRAVENOUS
  Filled 2020-05-14: qty 50

## 2020-05-14 MED ORDER — ACETAMINOPHEN 325 MG PO TABS: 650.0000 mg | ORAL_TABLET | Freq: Four times a day (QID) | ORAL | Status: DC | PRN

## 2020-05-14 MED ORDER — DEXTROSE 5 % IV SOLN: INTRAVENOUS | Status: DC

## 2020-05-14 MED ORDER — GLYCOPYRROLATE 1 MG PO TABS
1.0000 mg | ORAL_TABLET | ORAL | Status: DC | PRN
  Filled 2020-05-14: qty 1

## 2020-05-14 MED ORDER — GLYCOPYRROLATE 0.2 MG/ML IJ SOLN
0.2000 mg | INTRAMUSCULAR | Status: DC | PRN
  Administered 2020-05-14: 0.2 mg via INTRAVENOUS
  Filled 2020-05-14: qty 1

## 2020-05-14 MED ORDER — MIDAZOLAM HCL 2 MG/2ML IJ SOLN
1.0000 mg | INTRAMUSCULAR | Status: DC | PRN
  Administered 2020-05-14 (×2): 2 mg via INTRAVENOUS
  Filled 2020-05-14 (×2): qty 2

## 2020-05-14 MED ORDER — ACETAMINOPHEN 650 MG RE SUPP: 650.0000 mg | Freq: Four times a day (QID) | RECTAL | Status: DC | PRN

## 2020-05-15 ENCOUNTER — Encounter: Payer: Self-pay | Admitting: Student in an Organized Health Care Education/Training Program

## 2020-05-18 LAB — CULTURE, BLOOD (ROUTINE X 2)
Culture: NO GROWTH
Culture: NO GROWTH

## 2020-05-30 NOTE — Discharge Summary (Signed)
   NAME:  [name], MRN:  [id number], DOB:  1928/01/02, LOS: 1 ADMISSION DATE:  05/20/2020, , CHIEF COMPLAINT:  Cardiac arrest  Brief History   86 yo female that had a total time cardiac arrest of 90mins  History of present illness   86 y/o female that presented to ER from home.  She was getting ready for bed and was being assisted by her husband when she acutely became unresponsive.  When EMS arrived they found her with a heart rate of 30. She then went into cardiac arrest. Two episodes of cardiac arrest occurred with each lasting approx 92mins.  Pt was intubated in the ER but did not require sedation.  No significant neurologic recovery since the events.  She is a DNR and has been in the past.    Past Medical History  Afib COPD Hyperlipidemia CVA Dementia   Significant Diagnostic Tests:  CXR:  Right upper lobe infiltrate  Micro Data:  Influenza negative COVID negative  Antimicrobials:  Vanco Zosyn   Assessment & Plan:  Out of hospital cardiac arrest -Patient was found with heart rate in the 30s -Unclear downtime prior to being initially evaluated and subsequently did have 15-minute cardiopulmonary resuscitations x 2 -Continue Levophed for MAP 65 and above -dc TTM  History of dementia Anoxic encephalopathy -Status myoclonus, confirmed on LTM EEG   Acute respiratory failure -Continue full vent support at present  atrial fibrillation   Chronic obstructive pulmonary disease -Continue bronchodilators  Right upper lobe infiltrate may be related to aspiration -On Zosyn   COURSE -  DNR reissued.  Poor prognosis for meaningful neurologic recovery given to husband. Life support was withdrawn & comfort measures main focus  Cause of death - Anoxic encephalopathy , Acute respiratory failure, dementia, Afib, COPD  Kamylle Axelson V. Elsworth Soho MD  05/15/2020

## 2020-05-30 NOTE — Progress Notes (Signed)
Patient noted to be asystole on the monitor. Assessed patient - no heart or lung sounds auscultated. Verified with second RN. Patient's spouse notified. Elink notified - stated they would notify [facility]. Night shift RN at bedside. Fentanyl bag empty, except for the chamber on the tubing, remainder wasted with Erma Heritage, RN.  Joellen Jersey, RN

## 2020-05-30 NOTE — Progress Notes (Signed)
NAME:  [name], MRN:  [id number], DOB:  [birth year], LOS: 1 ADMISSION DATE:  05/13/2020, , CHIEF COMPLAINT:  Cardiac arrest  Brief History   86 yo female that had a total time cardiac arrest of 62mins  History of present illness   This ia 86 y/o female that presented to ER from home.  The was getting ready for bed and was being assisted by her husband when she acutely became unresponsive.  When EMS arrived they found her with a heart rate of 30. She then went into cardiac arrest. Two episodes of cardiac arrest occurred with each lasting approx 69mins.  Pt was intubated in the ER but did not require sedation.  No significant neurologic recovery since the events.  She is a DNR and has been in the past.    Past Medical History  Afib COPD Hyperlipidemia CVA Dementia   Significant Diagnostic Tests:  CXR:  Right upper lobe infiltrate  Micro Data:  Influenza negative COVID negative  Antimicrobials:  Vanco Zosyn   SUBJ -   Critically ill, intubated Ongoing myoclonic seizure activity with LTM EEG Given Ativan with bradycardia Objective   Blood pressure (!) 132/111, pulse 60, temperature 98.8 F (37.1 C), resp. rate (!) 35, height 5\' 8"  (1.727 m), weight 52.4 kg, SpO2 100 %.    Vent Mode: PRVC FiO2 (%):  [40 %-60 %] 50 % Set Rate:  [18 bmp] 18 bmp Vt Set:  [510 mL] 510 mL PEEP:  [5 cmH20] 5 cmH20 Plateau Pressure:  [15 cmH20-18 cmH20] 15 cmH20   Intake/Output Summary (Last 24 hours) at May 29, 2020 1039 Last data filed at 29-May-2020 1000 Gross per 24 hour  Intake 1300.76 ml  Output 530 ml  Net 770.76 ml   Filed Weights   05/13/20 0230 05/29/2020 0500  Weight: 52.5 kg 52.4 kg    Examination: Gen:      elderly woman, no distress  HEENT:  EOMI, sclera anicteric, mild pallor Neck:     No JVD; no thyromegaly Lungs:    Bilateral ventilated breath sounds CV:         Regular rate and rhythm; no murmurs Abd:      + bowel sounds; soft, non-tender; no palpable masses, no  distension Ext:    No edema; adequate peripheral perfusion Skin:      Warm and dry; no rash Neuro: Myoclonic seizure activity, pupils not reactive to light, no doll's eye, on LTM EEG   Assessment & Plan:  Out of hospital cardiac arrest -Patient was found with heart rate in the 30s -Unclear downtime prior to being initially evaluated and subsequently did have 15-minute cardiopulmonary resuscitations x 2  -Continue Levophed for MAP 65 and above -dc TTM  History of dementia Anoxic encephalopathy -Status myoclonus, confirmed on LTM EEG   Acute respiratory failure -Continue full vent support at present  atrial fibrillation   Chronic obstructive pulmonary disease -Continue bronchodilators  Right upper lobe infiltrate may be related to aspiration -On Zosyn   Updated husband at bedside.  His niece was also present.  DNR reissued.  Poor prognosis for meaningful neurologic recovery given.  He agreed to withdrawal of life support.  He understands that she will likely die very soon.  We will focus on comfort measures with fentanyl drip and use Versed for seizures although unlikely that myoclonus will be controlled  Best practice:  Diet: NPO Pain/Anxiety/Delirium protocol (if indicated): fentanyl  VAP protocol (if indicated): started DVT prophylaxis: Heparin SQ GI prophylaxis: Protonix  Glucose control: Insulin SS *Mobility: Bedrest Code Status: DNR Family Communication: Husband and niece at bedside Disposition:  ICU  Labs   CBC: Recent Labs  Lab 05/19/2020 2305 05/23/2020 2318 05/13/20 0155  WBC 13.4*  --  14.0*  HGB 10.3* 7.1* 9.8*  HCT 34.6* 21.0* 31.7*  MCV 107.8*  --  103.9*  PLT 165  --  419    Basic Metabolic Panel: Recent Labs  Lab 05/16/2020 2305 05/28/2020 2318 05/13/20 0155 05/13/20 1224 2020-05-23 0057  NA 139 146* 142 142 140  K 4.7 3.6 5.4* 4.8 4.7  CL 109  --  110 109 111  CO2 17*  --  22 20* 18*  GLUCOSE 239*  --  244* 230* 109*  BUN 33*  --  33* 37*  41*  CREATININE 2.21*  --  2.08* 2.36* 2.64*  CALCIUM 8.7*  --  8.1* 8.2* 8.2*  MG 2.2  --  1.8  --  1.7  PHOS  --   --  4.5  --   --    GFR: Estimated Creatinine Clearance: 12 mL/min (A) (by C-G formula based on SCr of 2.64 mg/dL (H)). Recent Labs  Lab 05/15/2020 2258 05/05/2020 2305 05/13/20 0155  WBC  --  13.4* 14.0*  LATICACIDVEN 7.1*  --   --     Liver Function Tests: No results for input(s): AST, ALT, ALKPHOS, BILITOT, PROT, ALBUMIN in the last 168 hours. No results for input(s): LIPASE, AMYLASE in the last 168 hours. No results for input(s): AMMONIA in the last 168 hours.  ABG    Component Value Date/Time   PHART 7.393 05/13/2020 2318   PCO2ART 35.8 05/04/2020 2318   PO2ART 467 (H) 05/20/2020 2318   HCO3 22.2 05/10/2020 2318   TCO2 23 05/03/2020 2318   ACIDBASEDEF 3.0 (H) 05/15/2020 2318   O2SAT 100.0 05/03/2020 2318     Critical care time 35 minutes  Kara Mead MD. FCCP. [facility] See Amion for pager  If no response to pager , please call [phone]  After 7:00 pm call Elink  [phone]   05/23/20

## 2020-05-30 NOTE — Plan of Care (Signed)
  Problem: Skin Integrity: Goal: Risk for impaired skin integrity will be minimized. Outcome: Progressing   Problem: Clinical Measurements: Goal: Will remain free from infection Outcome: Progressing Goal: Cardiovascular complication will be avoided Outcome: Progressing   Problem: Elimination: Goal: Will not experience complications related to urinary retention Outcome: Progressing   Problem: Pain Managment: Goal: General experience of comfort will improve Outcome: Progressing   Problem: Safety: Goal: Ability to remain free from injury will improve Outcome: Progressing   Problem: Skin Integrity: Goal: Risk for impaired skin integrity will decrease Outcome: Progressing

## 2020-05-30 NOTE — Progress Notes (Signed)
RN changed the monitors pulse ox cord in efforts to obtain better readings. Monitor is showing SpO2 of 100% with appropriate waveform.

## 2020-05-30 NOTE — Progress Notes (Signed)
Called husband to offer support and comfort upon receiving the notification of [name] condition. Left a voicemail.

## 2020-05-30 NOTE — Procedures (Signed)
Extubation Procedure Note  Patient Details:   Name: EMILLY LAVEY DOB: 07/21/1927 MRN: [id number]   Airway Documentation:    Vent end date: Jun 10, 2020 Vent end time: 1222   Evaluation  Pt extubated to RA per comfort care orders.   Vilinda Blanks 06/10/20, 12:23 PM

## 2020-05-30 NOTE — Progress Notes (Signed)
TTM rewarming phase started at [phone]

## 2020-05-30 NOTE — Progress Notes (Signed)
Pt had a episode where her SpO2 dropped to 70-75% on the monitor, RN hooked pt up to a portable Spo2 monitor. Results showed pt is at 100% Spo2 and 65HR. Pt is currently on 60% Fio2. RN believes d/t pts increased tremor activity, the monitor is having a hard time picking up patients true values. Spo2 sensor changed to test validity, and the monitor is having a hard time picking up a consistant waveform.

## 2020-05-30 NOTE — Progress Notes (Signed)
[place] Progress Note Patient Name: [name] DOB: 1928-03-06 MRN: [id number]   Date of Service  2020-05-20  HPI/Events of Note  Anoxic myoclonus - Dropped HR with Ativan earlier today. BP =  131/88 and HR = 93.   eICU Interventions  Plan: 1. Versed 1 mg IV Q 2 hours PRN anoxic myoclonus.      Intervention Category Major Interventions: Other:  Keone Kamer Cornelia Copa 05-20-20, 1:32 AM

## 2020-05-30 NOTE — Progress Notes (Signed)
Blood Pressure Results are not reliable d/t pt myoclonic activity. Pt blood pressure has been taken all extremities and none are giving results that would be accurate.

## 2020-05-30 NOTE — Procedures (Addendum)
Patient Name: [name]  MRN: [id number]  Epilepsy Attending: Lora Havens  Referring Physician/Provider: Dr. Rhys Martini Duration: 05/13/2020 1205 to Jun 02, 2020 1354  Patient history: 86 year old female status post cardiac arrest.  EEG to evaluate for seizures.  Level of alertness: Comatose  AEDs during EEG study: None  Technical aspects: This EEG study was done with scalp electrodes positioned according to the 10-20 International system of electrode placement. Electrical activity was acquired at a sampling rate of 500Hz  and reviewed with a high frequency filter of 70Hz  and a low frequency filter of 1Hz . EEG data were recorded continuously and digitally stored.   Description: Patient was noted to have episodes of brief shoulder jerking.  Concomitant EEG showed generalized epileptiform discharges with spike and wave morphology consistent with myoclonic seizures.  These myoclonic seizures were initially every 3 to 4 seconds.  However after around 1 AM on 06/02/20, the episodes became more frequent and started happening every second consistent with myoclonic status epilepticus.  In between the periodic epileptiform discharges, EEG showed background suppression.  ABNORMALITY -Myoclonic status epilepticus  IMPRESSION: This study initially showed evidence of myoclonic seizures which gradually progressed to myoclonic status epilepticus.  There is also evidence of profound diffuse encephalopathy.  The constellation of EEG findings are usually suggestive of severe anoxic/hypoxic brain injury with poor neurologic recovery.    Dr Elsworth Soho was notified  Jo-Anne Kluth Barbra Sarks

## 2020-05-30 NOTE — Plan of Care (Signed)
  Problem: Education: Goal: Ability to manage disease process will improve Outcome: Not Progressing   Problem: Cardiac: Goal: Ability to achieve and maintain adequate cardiopulmonary perfusion will improve Outcome: Not Progressing   Problem: Neurologic: Goal: Promote progressive neurologic recovery Outcome: Not Progressing   Problem: Education: Goal: Knowledge of General Education information will improve Description: Including pain rating scale, medication(s)/side effects and non-pharmacologic comfort measures Outcome: Not Progressing   Problem: Health Behavior/Discharge Planning: Goal: Ability to manage health-related needs will improve Outcome: Not Progressing   Problem: Clinical Measurements: Goal: Ability to maintain clinical measurements within normal limits will improve Outcome: Not Progressing Goal: Diagnostic test results will improve Outcome: Not Progressing Goal: Respiratory complications will improve Outcome: Not Progressing   Problem: Activity: Goal: Risk for activity intolerance will decrease Outcome: Not Progressing   Problem: Nutrition: Goal: Adequate nutrition will be maintained Outcome: Not Progressing   Problem: Elimination: Goal: Will not experience complications related to bowel motility Outcome: Not Progressing

## 2020-05-30 NOTE — Progress Notes (Signed)
vLTM discontinued.  No skin breakdown noted at [place]  FP2  F7 F8,

## 2020-05-30 NOTE — Progress Notes (Addendum)
Pt is having increased tremor activity as well as changes in her EKG. Pt is known to have a hx of afib.Pt EKG rhythm does appear to be in Afib at this time. This RN noticed a change where pt EKG seemed to be changing with lower pulse rate noted on the monitor. RN was able to doppler pt femoral pulse and pt does indeed have an irregular pulse with a rate 50-60bpm. Pulses are jumping from rates as low as 28 to 130s on the monitor but not on doppler.  Elink notified, pending orders. Lab is at bedside obtaining blood.

## 2020-05-30 NOTE — Progress Notes (Addendum)
This RN took shift right after Time of Death was called. No heart or lung sounds noted. No further assessment needed at this time.

## 2020-05-30 NOTE — Progress Notes (Signed)
[facility] Progress Note Patient Name: [name] DOB: 1928-05-15 MRN: [id number]   Date of Service  05-24-2020  HPI/Events of Note  Hypomagnesemia - Mg++ level = 1.6 and Creatinine = 2.64.  eICU Interventions  Will replace Mg++.     Intervention Category Major Interventions: Electrolyte abnormality - evaluation and management  Brindy Higginbotham [place] 05/24/20, 2:02 AM

## 2020-05-30 DEATH — deceased

## 2020-08-31 IMAGING — DX DG CHEST 1V PORT
1 series · 1 of 1 positions shown · non-contrast
Comparison: 02/07/2019

CLINICAL DATA: Altered mental status, cough

EXAM:
PORTABLE CHEST 1 VIEW

[chest ap]
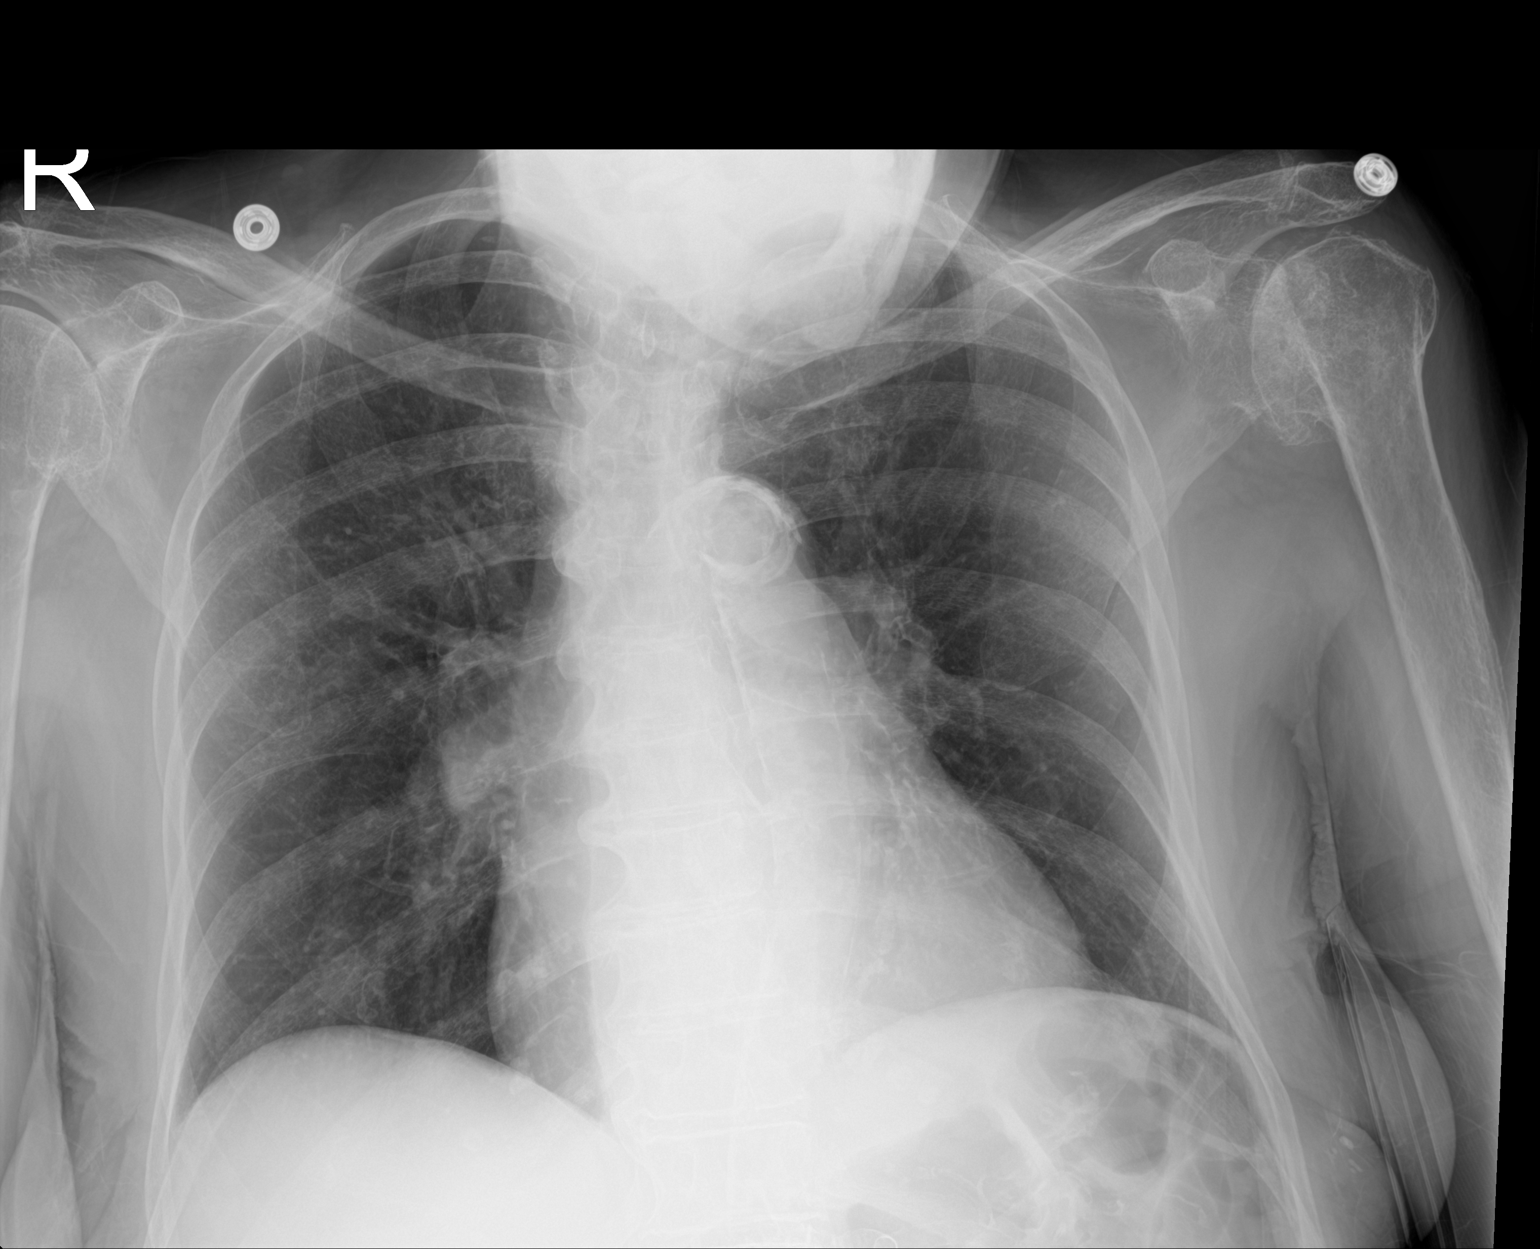

[1 of 1 positions shown; findings below may reference images not displayed]

FINDINGS: Borderline cardiomegaly. Stable prominent vascularity centrally. No
focal pneumonia, collapse or consolidation. Negative for edema,
effusion or pneumothorax. Aorta atherosclerotic. Degenerative
changes of the spine and shoulders. Bones are osteopenic.
IMPRESSION: Stable cardiomegaly without acute process.

Aortic atherosclerosis

## 2020-08-31 IMAGING — CT CT HEAD W/O CM
3 series · 14 of 47 positions shown, 16 images · non-contrast
Comparison: February 07, 2019

CLINICAL DATA: Acute mental status change

EXAM:
CT HEAD WITHOUT CONTRAST
TECHNIQUE: Contiguous axial images were obtained from the base of the skull
through the vertex without intravenous contrast.

[Series 2: head wo · axial · 0.47mm/px · z∈[-143,-18]mm · 8 of 31 slices shown, 10 images]
[im 3/31  brain]
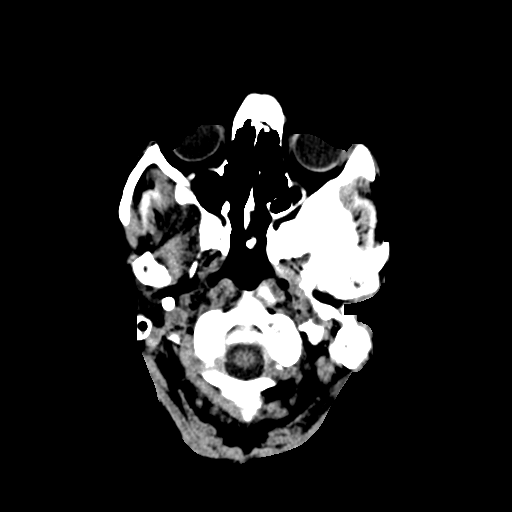
[im 3/31  bone]
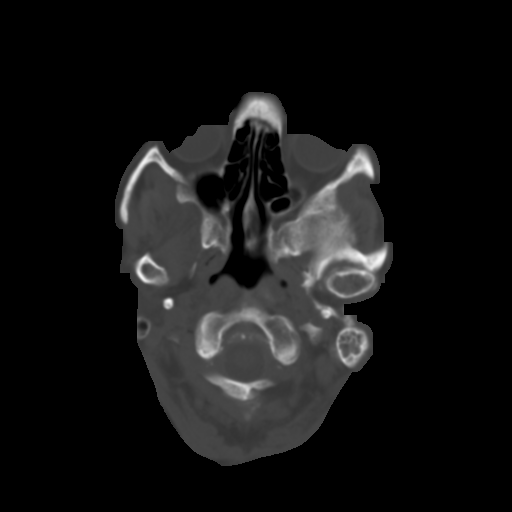
[im 7/31  brain]
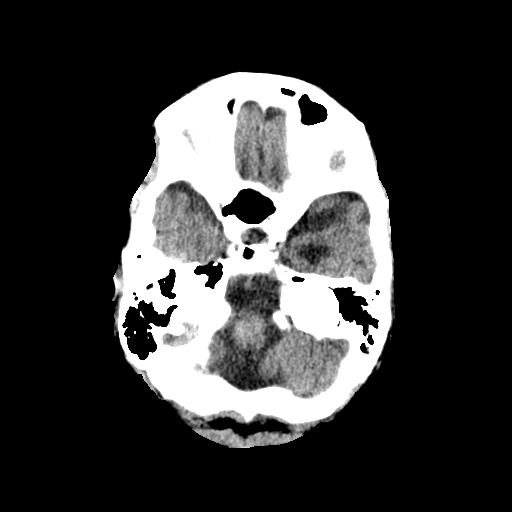
[im 10/31  brain]
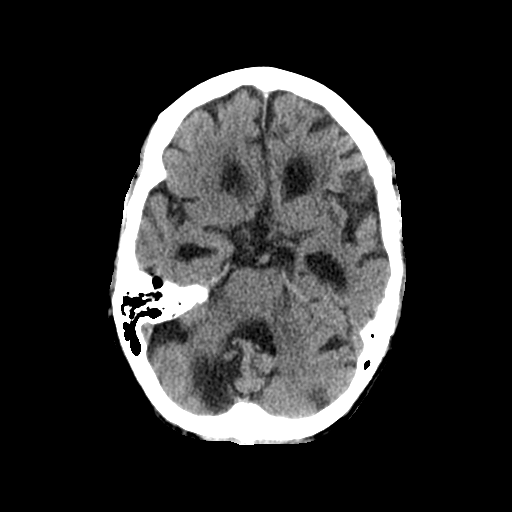
[im 14/31  brain]
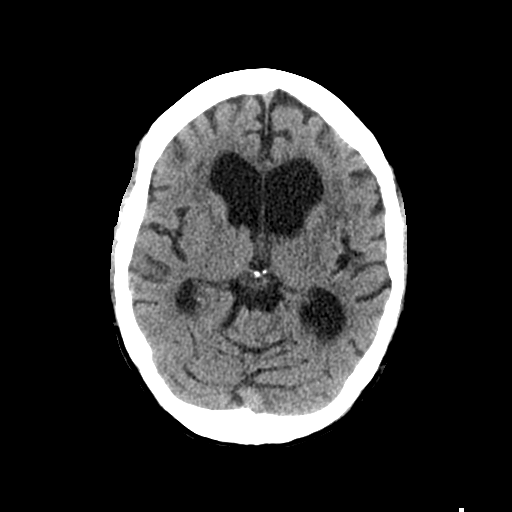
[im 17/31  brain]
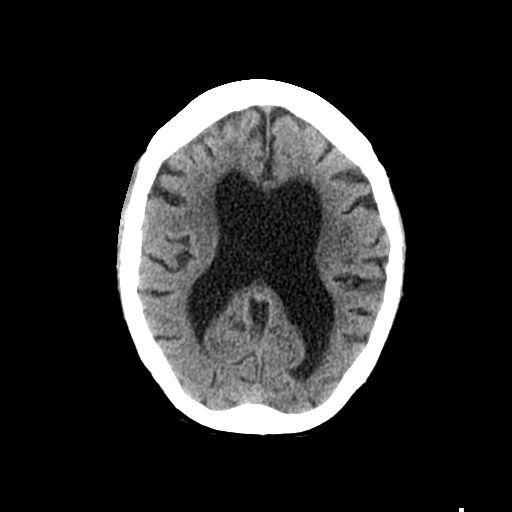
[im 17/31  bone]
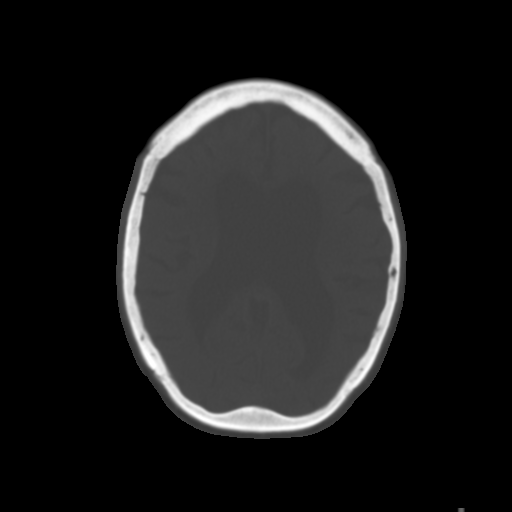
[im 21/31  brain]
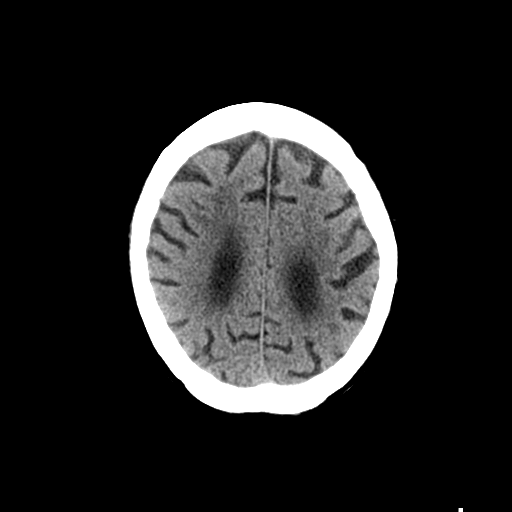
[im 24/31  brain]
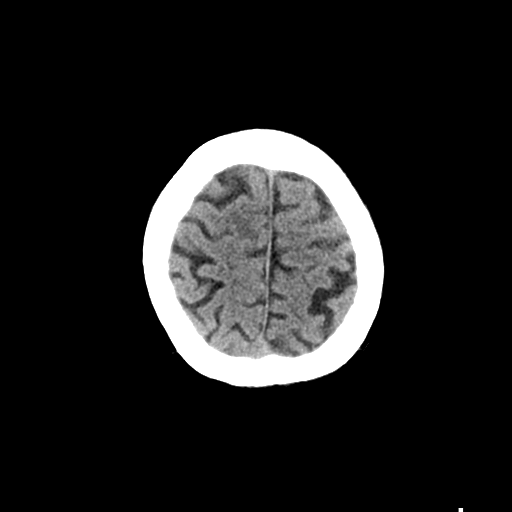
[im 28/31  brain]
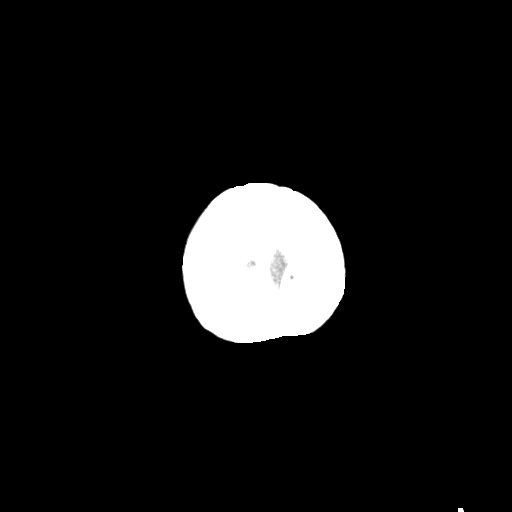

[Series 4: coronal soft tissue · coronal · 0.31mm/px · 3 of 59 slices shown]
[im 20/59  brain]
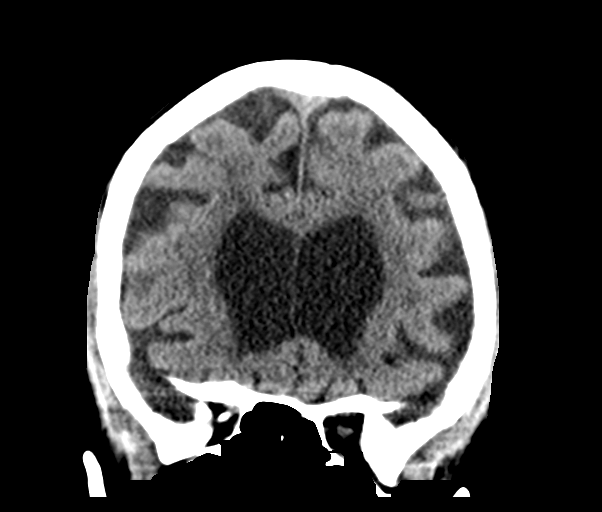
[im 26/59  brain]
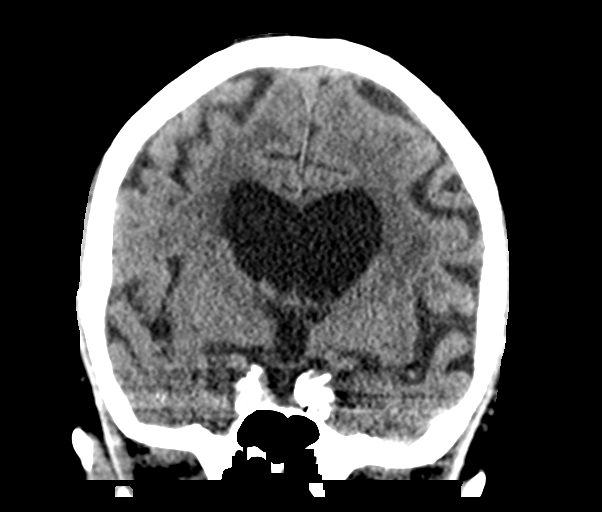
[im 33/59  brain]
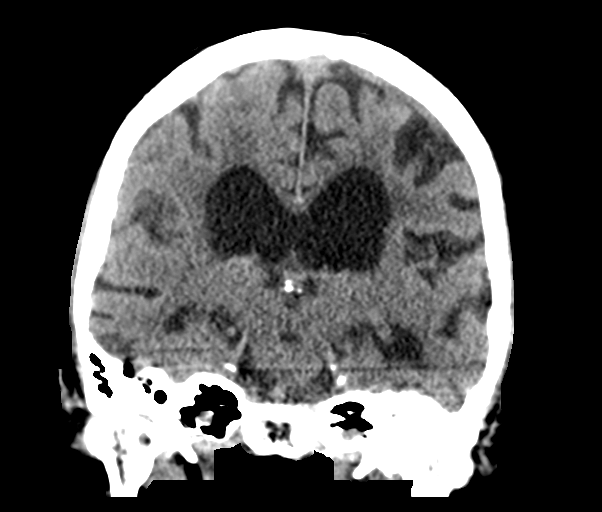

[Series 5: sagittal soft tissue · sagittal · 0.32mm/px · 3 of 50 slices shown]
[im 17/50  brain]
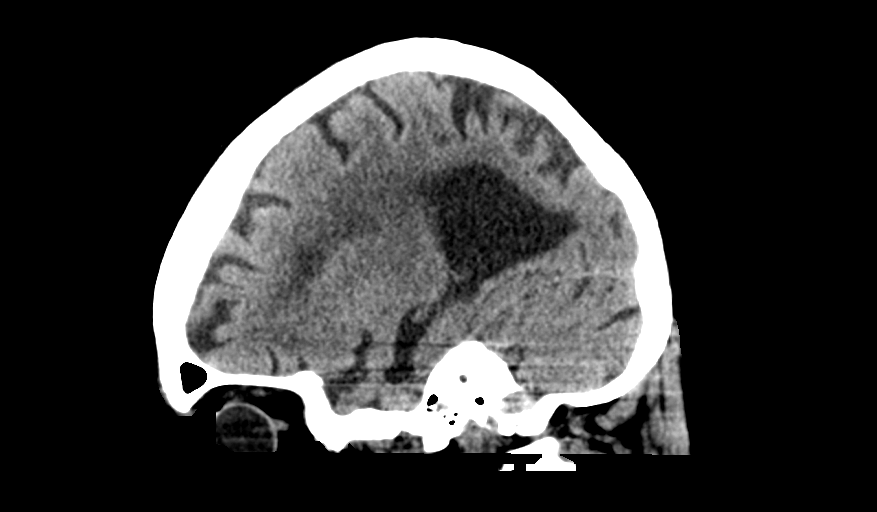
[im 25/50  brain]
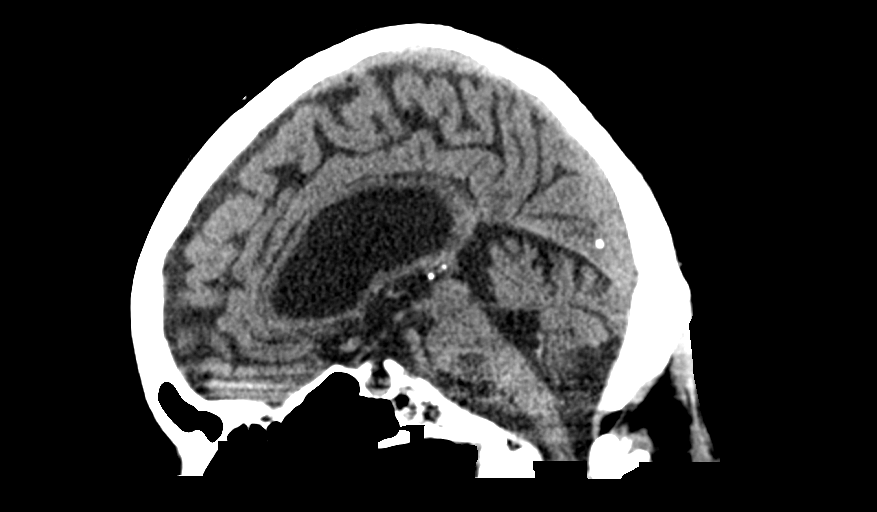
[im 33/50  brain]
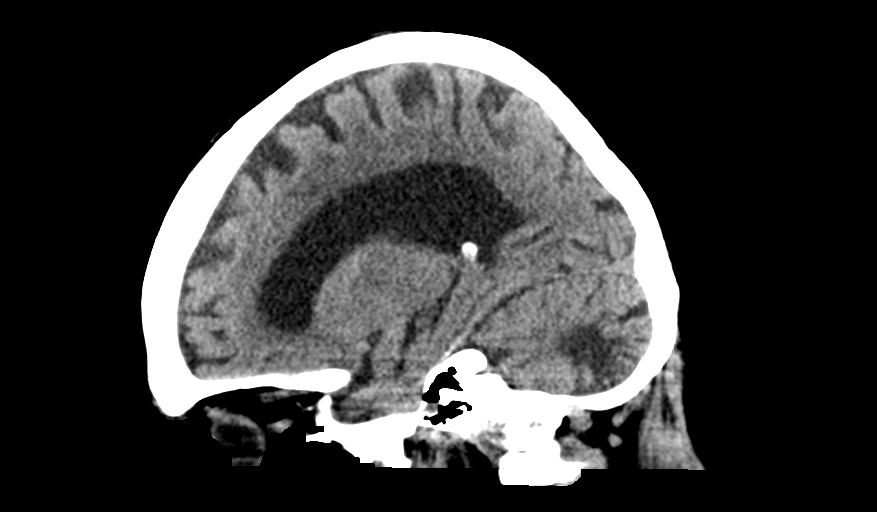

[14 of 47 positions shown; findings below may reference images not displayed]

FINDINGS: Brain: No subdural, epidural, or subarachnoid hemorrhage.
Ventricular prominence is stable. More mild sulcal prominence is
stable. The cerebellar infarct is stable. Brainstem and basal
cisterns are normal. White matter changes are unchanged. No acute
cortical ischemia or infarct. No mass effect or midline shift.

Vascular: Calcified atherosclerosis in the intracranial carotids.

Skull: Normal. Negative for fracture or focal lesion.

Sinuses/Orbits: There is fluid in the left mastoid air cells without
bony erosion. This is a stable finding. The right mastoid air cells
and middle ears are well aerated. Paranasal sinuses are normal.

Other: None.
IMPRESSION: Chronic white matter changes.  No acute intracranial abnormalities.
